# Patient Record
Sex: Male | Born: 1945
Health system: Southern US, Community
[De-identification: ages and names within clinical notes are randomized; demographics above are authoritative.]

## PROBLEM LIST (undated history)

## (undated) DIAGNOSIS — Z8673 Personal history of transient ischemic attack (TIA), and cerebral infarction without residual deficits: Secondary | ICD-10-CM

## (undated) DIAGNOSIS — T7840XA Allergy, unspecified, initial encounter: Secondary | ICD-10-CM

## (undated) DIAGNOSIS — I358 Other nonrheumatic aortic valve disorders: Secondary | ICD-10-CM

## (undated) DIAGNOSIS — M1990 Unspecified osteoarthritis, unspecified site: Secondary | ICD-10-CM

## (undated) DIAGNOSIS — R011 Cardiac murmur, unspecified: Secondary | ICD-10-CM

## (undated) DIAGNOSIS — R29898 Other symptoms and signs involving the musculoskeletal system: Secondary | ICD-10-CM

## (undated) DIAGNOSIS — D62 Acute posthemorrhagic anemia: Secondary | ICD-10-CM

## (undated) DIAGNOSIS — M109 Gout, unspecified: Secondary | ICD-10-CM

## (undated) DIAGNOSIS — M25561 Pain in right knee: Secondary | ICD-10-CM

## (undated) DIAGNOSIS — E669 Obesity, unspecified: Secondary | ICD-10-CM

## (undated) DIAGNOSIS — C801 Malignant (primary) neoplasm, unspecified: Secondary | ICD-10-CM

## (undated) DIAGNOSIS — N183 Chronic kidney disease, stage 3 unspecified: Secondary | ICD-10-CM

## (undated) DIAGNOSIS — K8020 Calculus of gallbladder without cholecystitis without obstruction: Secondary | ICD-10-CM

## (undated) DIAGNOSIS — I10 Essential (primary) hypertension: Secondary | ICD-10-CM

## (undated) DIAGNOSIS — R569 Unspecified convulsions: Secondary | ICD-10-CM

## (undated) DIAGNOSIS — G629 Polyneuropathy, unspecified: Secondary | ICD-10-CM

## (undated) DIAGNOSIS — K5790 Diverticulosis of intestine, part unspecified, without perforation or abscess without bleeding: Secondary | ICD-10-CM

## (undated) DIAGNOSIS — I2510 Atherosclerotic heart disease of native coronary artery without angina pectoris: Secondary | ICD-10-CM

## (undated) DIAGNOSIS — A6000 Herpesviral infection of urogenital system, unspecified: Secondary | ICD-10-CM

## (undated) DIAGNOSIS — E119 Type 2 diabetes mellitus without complications: Secondary | ICD-10-CM

## (undated) DIAGNOSIS — E785 Hyperlipidemia, unspecified: Secondary | ICD-10-CM

## (undated) HISTORY — DX: Acute posthemorrhagic anemia: D62

## (undated) HISTORY — DX: Gout, unspecified: M10.9

## (undated) HISTORY — DX: Essential (primary) hypertension: I10

## (undated) HISTORY — DX: Malignant (primary) neoplasm, unspecified: C80.1

## (undated) HISTORY — DX: Chronic kidney disease, stage 3 unspecified: N18.30

## (undated) HISTORY — DX: Calculus of gallbladder without cholecystitis without obstruction: K80.20

## (undated) HISTORY — DX: Herpesviral infection of urogenital system, unspecified: A60.00

## (undated) HISTORY — DX: Hyperlipidemia, unspecified: E78.5

## (undated) HISTORY — DX: Obesity, unspecified: E66.9

## (undated) HISTORY — DX: Chronic kidney disease, stage 3 (moderate): N18.3

## (undated) HISTORY — DX: Unspecified osteoarthritis, unspecified site: M19.90

## (undated) HISTORY — DX: Type 2 diabetes mellitus without complications: E11.9

## (undated) HISTORY — DX: Allergy, unspecified, initial encounter: T78.40XA

## (undated) HISTORY — DX: Diverticulosis of intestine, part unspecified, without perforation or abscess without bleeding: K57.90

## (undated) HISTORY — DX: Atherosclerotic heart disease of native coronary artery without angina pectoris: I25.10

## (undated) HISTORY — DX: Other nonrheumatic aortic valve disorders: I35.8

---
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
## 2003-10-29 DIAGNOSIS — I2510 Atherosclerotic heart disease of native coronary artery without angina pectoris: Secondary | ICD-10-CM

## 2003-10-29 HISTORY — PX: CORONARY ANGIOPLASTY WITH STENT PLACEMENT: SHX49

## 2003-10-29 HISTORY — DX: Atherosclerotic heart disease of native coronary artery without angina pectoris: I25.10

## 2004-05-03 ENCOUNTER — Other Ambulatory Visit: Payer: Self-pay

## 2006-10-28 HISTORY — PX: KNEE ARTHROSCOPY: SUR90

## 2007-03-18 ENCOUNTER — Ambulatory Visit: Payer: Self-pay | Admitting: Specialist

## 2007-04-09 ENCOUNTER — Other Ambulatory Visit: Payer: Self-pay

## 2007-04-09 ENCOUNTER — Ambulatory Visit: Payer: Self-pay | Admitting: Specialist

## 2007-04-17 ENCOUNTER — Ambulatory Visit: Payer: Self-pay | Admitting: Specialist

## 2008-02-26 HISTORY — PX: COLONOSCOPY: SHX174

## 2008-03-08 ENCOUNTER — Ambulatory Visit: Payer: Self-pay | Admitting: Gastroenterology

## 2008-10-28 DIAGNOSIS — E119 Type 2 diabetes mellitus without complications: Secondary | ICD-10-CM

## 2008-10-28 HISTORY — DX: Type 2 diabetes mellitus without complications: E11.9

## 2011-06-27 LAB — LIPID PANEL
HDL: 36 mg/dL (ref 35–70)
Triglyceride fasting, serum: 248

## 2011-06-27 LAB — COMPREHENSIVE METABOLIC PANEL
ALT: 16 U/L (ref 10–40)
AST: 15 U/L
Alkaline Phosphatase: 66 U/L
BUN: 34 mg/dL — AB (ref 4–21)
Glucose: 120
Potassium: 4.1 mmol/L
Total Bilirubin: 0.4 mg/dL

## 2011-06-27 LAB — CBC
Hemoglobin: 14 g/dL (ref 13.5–17.5)
WBC: 5.1
platelet count: 212

## 2012-06-18 ENCOUNTER — Ambulatory Visit (INDEPENDENT_AMBULATORY_CARE_PROVIDER_SITE_OTHER): Payer: BC Managed Care – PPO | Admitting: Family Medicine

## 2012-06-18 ENCOUNTER — Encounter: Payer: Self-pay | Admitting: Family Medicine

## 2012-06-18 VITALS — BP 132/76 | HR 72 | Temp 98.3°F | Ht 69.0 in | Wt 265.5 lb

## 2012-06-18 DIAGNOSIS — E669 Obesity, unspecified: Secondary | ICD-10-CM

## 2012-06-18 DIAGNOSIS — M109 Gout, unspecified: Secondary | ICD-10-CM | POA: Insufficient documentation

## 2012-06-18 DIAGNOSIS — E785 Hyperlipidemia, unspecified: Secondary | ICD-10-CM | POA: Insufficient documentation

## 2012-06-18 DIAGNOSIS — E1159 Type 2 diabetes mellitus with other circulatory complications: Secondary | ICD-10-CM | POA: Insufficient documentation

## 2012-06-18 DIAGNOSIS — E118 Type 2 diabetes mellitus with unspecified complications: Secondary | ICD-10-CM | POA: Insufficient documentation

## 2012-06-18 DIAGNOSIS — I10 Essential (primary) hypertension: Secondary | ICD-10-CM | POA: Insufficient documentation

## 2012-06-18 DIAGNOSIS — I2510 Atherosclerotic heart disease of native coronary artery without angina pectoris: Secondary | ICD-10-CM | POA: Insufficient documentation

## 2012-06-18 DIAGNOSIS — E1169 Type 2 diabetes mellitus with other specified complication: Secondary | ICD-10-CM | POA: Insufficient documentation

## 2012-06-18 DIAGNOSIS — E66812 Obesity, class 2: Secondary | ICD-10-CM | POA: Insufficient documentation

## 2012-06-18 DIAGNOSIS — E119 Type 2 diabetes mellitus without complications: Secondary | ICD-10-CM

## 2012-06-18 DIAGNOSIS — Z8639 Personal history of other endocrine, nutritional and metabolic disease: Secondary | ICD-10-CM | POA: Insufficient documentation

## 2012-06-18 MED ORDER — ATENOLOL-CHLORTHALIDONE 50-25 MG PO TABS: 1.0000 | ORAL_TABLET | Freq: Every day | ORAL | Status: DC

## 2012-06-18 MED ORDER — LOVASTATIN 40 MG PO TABS: 80.0000 mg | ORAL_TABLET | Freq: Every day | ORAL | Status: DC

## 2012-06-18 MED ORDER — QUINAPRIL HCL 40 MG PO TABS: 40.0000 mg | ORAL_TABLET | Freq: Every day | ORAL | Status: DC

## 2012-06-18 MED ORDER — METFORMIN HCL 500 MG PO TABS: 500.0000 mg | ORAL_TABLET | Freq: Two times a day (BID) | ORAL | Status: DC

## 2012-06-18 NOTE — Assessment & Plan Note (Signed)
H/o R knee pain that impeded activity, discussed slowly reincorporating exercise into routine.  Pt thinks could slowly restart walking,  Encouraged to do this.

## 2012-06-18 NOTE — Assessment & Plan Note (Signed)
Chronic, stable. Compliant with meds.  Continue. 

## 2012-06-18 NOTE — Assessment & Plan Note (Signed)
Stable. Asxs. Will await records from prior PCP, may need to obtain from Dr. Lady Gary.

## 2012-06-18 NOTE — Assessment & Plan Note (Signed)
Check A1c at work, review when returns for CPE. Sounds like good control, but has not been checking regularly for last several months. UTD on vision exam per pt.  Foot exam today.

## 2012-06-18 NOTE — Patient Instructions (Signed)
I've refilled meds - check with pharmacy to ensure they've been received. blood work at work, bring into next appointment which will be physical. Slowly restart walking - watching knee.  Good to meet you today, call us with questions.

## 2012-06-18 NOTE — Progress Notes (Signed)
Subjective:    Patient ID: Thomas Carlson, male    DOB: Sep 19, 1946, 66 y.o.   MRN: [id number]  HPI CC: new pt to establish   Prior saw Dr. Bethena Midget who retired.  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  Has requested records from Dr. Bethena Midget, pending.  DM - dx ~2010. Thinks last A1c ~6.4%.  Doesn't check sugars.  Eye exam in fall.  Gets checked annually.  Compliant with metformin 500mg  bid.  HTN - compliant with quinapril and tenoretic.  HLD - lovastatin 80mg  nightly.  No myalgias.  CAD - h/o stent 2005 with Dr. Lady Gary at [facility].  Never MI.  Found with worsening DOE.  Has not followed with cards in last several years.  Recent bout of gout has been treated with steroid shot in past, has some analgesic at home he takes prn pain.  Preventative: Last CPE 05/2011 Colonoscopy - done around 2005, normal, good for 10 yrs per patient Tetanus - 2010  Caffeine: 2 cans of diet soda/day Lives with wife.  1 dog at home.  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  Past histories reviewed and updated if relevant as below. There is no problem list on file for this patient.  Past Medical History  Diagnosis Date  . Diabetes mellitus 2010  . HTN (hypertension)   . HLD (hyperlipidemia)   . Genital herpes   . CAD (coronary artery disease) 2005    s/p stent Lady Gary)   Past Surgical History  Procedure Date  . Appendectomy 1958  . Hernia repair 1956  . Subdural hematoma evacuation via craniotomy 1964    hit in helmet by baseball  . Coronary angioplasty with stent placement 2005    stent 2005  . Closed reduction clavicle fracture 1955  . Knee arthroscopy 2008    torn meniscus   History  Substance Use Topics  . Smoking status: Never Smoker   . Smokeless tobacco: Never Used  . Alcohol Use: No    Family History  Problem Relation Age of Onset  . Diabetes Father 73  . Coronary artery disease Sister     catheterizations  . COPD Brother   . Stroke Brother   . Hypertension Brother   . Cancer Neg Hx    Allergies  Allergen Reactions  . Penicillins Hives   Current Outpatient Prescriptions on File Prior to Visit  Medication Sig Dispense Refill  . atenolol-chlorthalidone (TENORETIC) 50-25 MG per tablet Take 1 tablet by mouth daily.      . diphenhydrAMINE (BENADRYL) 50 MG capsule Take 50 mg by mouth at bedtime as needed.      . lovastatin (MEVACOR) 40 MG tablet Take 80 mg by mouth at bedtime.      . metFORMIN (GLUCOPHAGE) 500 MG tablet Take 500 mg by mouth 2 (two) times daily with a meal.      . quinapril (ACCUPRIL) 40 MG tablet Take 40 mg by mouth at bedtime.         Review of Systems  Constitutional: Negative for fever, chills, activity change, appetite change, fatigue and unexpected weight change.  HENT: Negative for hearing loss and neck pain.   Eyes: Negative for visual disturbance.  Respiratory: Negative for cough, chest tightness, shortness of breath and wheezing.   Cardiovascular: Negative for chest pain, palpitations and leg swelling.  Gastrointestinal: Negative for nausea, vomiting,  abdominal pain, diarrhea, constipation, blood in stool and abdominal distention.  Genitourinary: Negative for hematuria and difficulty urinating.  Musculoskeletal: Negative for myalgias and arthralgias.  Skin: Negative for rash.  Neurological: Negative for dizziness, seizures, syncope and headaches.  Hematological: Does not bruise/bleed easily.  Psychiatric/Behavioral: Negative for dysphoric mood. The patient is not nervous/anxious.        Objective:   Physical Exam  Nursing note and vitals reviewed. Constitutional: He is oriented to person, place, and time. He appears well-developed and well-nourished. No distress.       obese  HENT:  Head: Normocephalic and atraumatic.  Right  Ear: Hearing, tympanic membrane, external ear and ear canal normal.  Left Ear: Hearing, tympanic membrane, external ear and ear canal normal.  Nose: Nose normal.  Mouth/Throat: Oropharynx is clear and moist. No oropharyngeal exudate.  Eyes: Conjunctivae and EOM are normal. Pupils are equal, round, and reactive to light. No scleral icterus.  Neck: Normal range of motion. Neck supple. Carotid bruit is not present.  Cardiovascular: Normal rate, regular rhythm and intact distal pulses.   Murmur (2/6 SEM) heard. Pulses:      Radial pulses are 2+ on the right side, and 2+ on the left side.  Pulmonary/Chest: Effort normal and breath sounds normal. No respiratory distress. He has no wheezes. He has no rales.  Abdominal: Soft. Bowel sounds are normal. He exhibits no distension and no mass. There is no tenderness. There is no rebound and no guarding.  Musculoskeletal: Normal range of motion. He exhibits no edema.       Diabetic foot exam: Normal inspection No skin breakdown No calluses  Normal DP/PT pulses Normal sensation to light touch and monofilament Nails normal   Lymphadenopathy:    He has no cervical adenopathy.  Neurological: He is alert and oriented to person, place, and time.       CN grossly intact, station and gait intact  Skin: Skin is warm and dry. No rash noted.  Psychiatric: He has a normal mood and affect. His behavior is normal. Judgment and thought content normal.      Assessment & Plan:

## 2012-06-18 NOTE — Assessment & Plan Note (Signed)
Chronic, on lovastatin 80mg  nightly. Check FLP at work, asked him to fax me results to review at upcoming CPE

## 2012-06-22 ENCOUNTER — Telehealth: Payer: Self-pay | Admitting: *Deleted

## 2012-06-22 NOTE — Telephone Encounter (Signed)
Clarification request from mail order pharmacy. Longstanding hx of taking metformin ER. Confirming that you want him taking regular metformin or ER. Form in your IN box.

## 2012-06-23 MED ORDER — METFORMIN HCL ER 500 MG PO TB24: 500.0000 mg | ORAL_TABLET | Freq: Every day | ORAL | Status: DC

## 2012-06-23 NOTE — Telephone Encounter (Signed)
Noted. Replied and placed in Kim's box.

## 2012-06-24 ENCOUNTER — Encounter: Payer: Self-pay | Admitting: Family Medicine

## 2012-06-24 ENCOUNTER — Telehealth: Payer: Self-pay

## 2012-06-24 DIAGNOSIS — N183 Chronic kidney disease, stage 3 unspecified: Secondary | ICD-10-CM | POA: Insufficient documentation

## 2012-06-24 DIAGNOSIS — E1122 Type 2 diabetes mellitus with diabetic chronic kidney disease: Secondary | ICD-10-CM | POA: Insufficient documentation

## 2012-06-24 DIAGNOSIS — M1990 Unspecified osteoarthritis, unspecified site: Secondary | ICD-10-CM | POA: Insufficient documentation

## 2012-06-24 DIAGNOSIS — N1831 Chronic kidney disease, stage 3a: Secondary | ICD-10-CM | POA: Insufficient documentation

## 2012-06-24 MED ORDER — METFORMIN HCL ER 500 MG PO TB24: 500.0000 mg | ORAL_TABLET | Freq: Two times a day (BID) | ORAL | Status: DC

## 2012-06-24 NOTE — Telephone Encounter (Signed)
Form faxed as directed

## 2012-06-24 NOTE — Telephone Encounter (Signed)
Filled and palced in Kim's box. 

## 2012-06-24 NOTE — Telephone Encounter (Signed)
[facility] faxed clarification request for Metformin quantity and dispensing direction. Form in Dr Gutierrez's in box.

## 2012-06-26 LAB — CBC
Hemoglobin: 14.2 g/dL (ref 13.5–17.5)
platelet count: 229

## 2012-06-26 LAB — LIPID PANEL
Cholesterol: 152 mg/dL (ref 0–200)
HDL: 39 mg/dL (ref 35–70)
Triglycerides: 195

## 2012-06-26 LAB — COMPREHENSIVE METABOLIC PANEL
Alkaline Phosphatase: 72 U/L
BUN: 36 mg/dL — AB (ref 4–21)
Creat: 1.39
GGT: 27 U/L (ref 18–76)
Potassium: 3.9 mmol/L
Sodium: 141 mmol/L (ref 137–147)
Total Bilirubin: 0.3 mg/dL

## 2012-06-26 LAB — PSA: PSA: 0.1

## 2012-06-26 LAB — TSH: TSH: 2.97 u[IU]/mL (ref 0.41–5.90)

## 2012-07-06 ENCOUNTER — Encounter: Payer: Self-pay | Admitting: Family Medicine

## 2012-07-10 ENCOUNTER — Ambulatory Visit (INDEPENDENT_AMBULATORY_CARE_PROVIDER_SITE_OTHER): Payer: BC Managed Care – PPO | Admitting: Family Medicine

## 2012-07-10 ENCOUNTER — Encounter: Payer: Self-pay | Admitting: Family Medicine

## 2012-07-10 VITALS — BP 110/64 | HR 80 | Temp 98.0°F | Ht 69.0 in | Wt 258.2 lb

## 2012-07-10 DIAGNOSIS — E669 Obesity, unspecified: Secondary | ICD-10-CM

## 2012-07-10 DIAGNOSIS — E119 Type 2 diabetes mellitus without complications: Secondary | ICD-10-CM

## 2012-07-10 DIAGNOSIS — Z0000 Encounter for general adult medical examination without abnormal findings: Secondary | ICD-10-CM

## 2012-07-10 DIAGNOSIS — M109 Gout, unspecified: Secondary | ICD-10-CM

## 2012-07-10 DIAGNOSIS — Z23 Encounter for immunization: Secondary | ICD-10-CM

## 2012-07-10 DIAGNOSIS — N183 Chronic kidney disease, stage 3 unspecified: Secondary | ICD-10-CM

## 2012-07-10 MED ORDER — ACYCLOVIR 400 MG PO TABS: 400.0000 mg | ORAL_TABLET | Freq: Two times a day (BID) | ORAL | Status: DC

## 2012-07-10 NOTE — Assessment & Plan Note (Signed)
Not frequent flares. Not on daily med.

## 2012-07-10 NOTE — Patient Instructions (Addendum)
Pneumonia shot today. Sign release of records for [facility] colonoscopy. Flu shot at work. Return in 3 months for follow up after rechecking A1c and kidney function. Keep up the good work with lifestyle changes and weight!

## 2012-07-10 NOTE — Assessment & Plan Note (Signed)
Weight loss noted. Congratulated 

## 2012-07-10 NOTE — Assessment & Plan Note (Signed)
Chronic Deteriorated control with last A1c 7.0%.   Since then, has lost weight and monitored diet more carefully. Continue current meds. rtc 3 mo for recheck. Has started checking cbgs regularly, brings log.

## 2012-07-10 NOTE — Progress Notes (Signed)
Subjective:    Patient ID: Thomas Carlson, male    DOB: 1946-09-16, 66 y.o.   MRN: [id number]  HPI CC: CPE  DM - recent blood work with A1c elevated to 7.0%.  Brings log showing sugars - fasting 120-130.  Started walking more.  Started watching sugar. No results found for this basename: HGBA1C    CAD- denies anginal sxs.  S/p stent, does not currently follow with cards.  prior Dr. Lady Gary at [facility] Readings from Last 3 Encounters:  07/10/12 258 lb 4 oz (117.141 kg)  06/18/12 265 lb 8 oz (120.43 kg)   Preventative:  Last CPE 05/2011  Colonoscopy - done around 2005, normal, good for 10 yrs per patient .  No record of this in prior records from Dr. Buzzy Han.  Will request today Prostate - would like to be screened but declines DRE today as PSA 0.1  Aware both tests screen better than either alone. Tetanus - 2012 flu shot to receive at work. Shingle shot 2011 Pneumovax today.  No falls in last year. Denies depression/anhedonia, sadness.  Seat belt use discuss 100% Sunscreen use discussed  Medications and allergies reviewed and updated in chart.  Past histories reviewed and updated if relevant as below. Patient Active Problem List  Diagnosis  . Diabetes mellitus  . HTN (hypertension)  . HLD (hyperlipidemia)  . CAD (coronary artery disease)  . Gout  . Obesity  . Chronic kidney disease, stage 3, mod decreased GFR  . DJD (degenerative joint disease)   Past Medical History  Diagnosis Date  . Diabetes mellitus 2010  . HTN (hypertension)   . HLD (hyperlipidemia)   . Genital herpes   . CAD (coronary artery disease) 2005    s/p stent ([facility])  . Gout   . Obesity   . DJD (degenerative joint disease)     knee  . Chronic kidney disease, stage 3, mod decreased GFR    Past Surgical History  Procedure Date  . Appendectomy 1958  . Hernia repair 1956  . Subdural hematoma evacuation via craniotomy 1964    hit in helmet by baseball  . Coronary angioplasty with stent placement 2005   stent 2005  . Closed reduction clavicle fracture 1955  . Knee arthroscopy 2008    torn meniscus   History  Substance Use Topics  . Smoking status: Never Smoker   . Smokeless tobacco: Never Used  . Alcohol Use: No   Family History  Problem Relation Age of Onset  . Diabetes Father 56  . Coronary artery disease Sister     catheterizations  . COPD Brother   . Stroke Brother   . Hypertension Brother   . Cancer Neg Hx    Allergies  Allergen Reactions  . Penicillins Hives   Current Outpatient Prescriptions on File Prior to Visit  Medication Sig Dispense Refill  . acyclovir (ZOVIRAX) 400 MG tablet Take 400 mg by mouth 2 (two) times daily.      Marland Kitchen aspirin 81 MG tablet Take 81 mg by mouth daily.      Marland Kitchen atenolol-chlorthalidone (TENORETIC) 50-25 MG per tablet Take 1 tablet by mouth daily.  90 tablet  3  . diphenhydrAMINE (BENADRYL) 50 MG capsule Take 50 mg by mouth at bedtime as needed.      . lovastatin (MEVACOR) 40 MG tablet Take 2 tablets (80 mg total) by mouth at bedtime.  180 tablet  3  . metFORMIN (GLUCOPHAGE-XR) 500 MG 24 hr tablet Take 1 tablet (500 mg  total) by mouth 2 (two) times daily.  180 tablet  3  . Omega-3 Fatty Acids (FISH OIL) 1360 MG CAPS Take 1 capsule by mouth 2 (two) times daily.      . quinapril (ACCUPRIL) 40 MG tablet Take 1 tablet (40 mg total) by mouth at bedtime.  90 tablet  3    Review of Systems  Constitutional: Negative for fever, chills, activity change, appetite change, fatigue and unexpected weight change.  HENT: Negative for hearing loss and neck pain.   Eyes: Negative for visual disturbance.  Respiratory: Negative for cough, chest tightness, shortness of breath and wheezing.   Cardiovascular: Negative for chest pain, palpitations and leg swelling.  Gastrointestinal: Negative for nausea, vomiting, abdominal pain, diarrhea, constipation, blood in stool and abdominal distention.  Genitourinary: Negative for hematuria and difficulty urinating.    Musculoskeletal: Negative for myalgias and arthralgias.  Skin: Negative for rash.  Neurological: Negative for dizziness, seizures, syncope and headaches.  Hematological: Does not bruise/bleed easily.  Psychiatric/Behavioral: Negative for dysphoric mood. The patient is not nervous/anxious.        Objective:   Physical Exam  Nursing note and vitals reviewed. Constitutional: He is oriented to person, place, and time. He appears well-developed and well-nourished. No distress.       obese  HENT:  Head: Normocephalic and atraumatic.  Right Ear: External ear normal.  Left Ear: External ear normal.  Nose: Nose normal.  Mouth/Throat: Oropharynx is clear and moist. No oropharyngeal exudate.  Eyes: Conjunctivae normal and EOM are normal. Pupils are equal, round, and reactive to light. No scleral icterus.  Neck: Normal range of motion. Neck supple. Carotid bruit is not present.  Cardiovascular: Normal rate, regular rhythm, normal heart sounds and intact distal pulses.   No murmur heard. Pulses:      Radial pulses are 2+ on the right side, and 2+ on the left side.  Pulmonary/Chest: Effort normal and breath sounds normal. No respiratory distress. He has no wheezes. He has no rales.  Abdominal: Soft. Bowel sounds are normal. He exhibits no distension and no mass. There is no tenderness. There is no rebound and no guarding.  Genitourinary:       declined  Musculoskeletal: Normal range of motion. He exhibits no edema.  Lymphadenopathy:    He has no cervical adenopathy.  Neurological: He is alert and oriented to person, place, and time.       CN grossly intact, station and gait intact  Skin: Skin is warm and dry. No rash noted.  Psychiatric: He has a normal mood and affect. His behavior is normal. Judgment and thought content normal.       Assessment & Plan:

## 2012-07-10 NOTE — Assessment & Plan Note (Signed)
Continue to monitor for now. Watch metformin closely. Recheck in 3 mo.

## 2012-07-10 NOTE — Assessment & Plan Note (Signed)
Preventative protocols reviewed and updated unless pt declined. discussed healthy diet/lifestyle

## 2012-07-10 NOTE — Addendum Note (Signed)
Addended by: Josph Macho A on: 07/10/2012 09:26 AM   Modules accepted: Orders

## 2012-09-08 ENCOUNTER — Encounter: Payer: Self-pay | Admitting: Family Medicine

## 2012-09-18 LAB — HEMOGLOBIN A1C: A1c: 6.2

## 2012-10-08 ENCOUNTER — Encounter: Payer: Self-pay | Admitting: *Deleted

## 2012-10-08 ENCOUNTER — Encounter: Payer: Self-pay | Admitting: Family Medicine

## 2012-10-09 ENCOUNTER — Ambulatory Visit: Payer: BC Managed Care – PPO | Admitting: Family Medicine

## 2012-10-09 DIAGNOSIS — Z0289 Encounter for other administrative examinations: Secondary | ICD-10-CM

## 2012-11-10 ENCOUNTER — Other Ambulatory Visit: Payer: Self-pay

## 2012-11-10 MED ORDER — METFORMIN HCL ER 500 MG PO TB24: 500.0000 mg | ORAL_TABLET | Freq: Two times a day (BID) | ORAL | Status: DC

## 2012-11-10 NOTE — Telephone Encounter (Signed)
Pt contacted [facility] and they did not receive metformin refill. Refill resent. Pt notified while on phone.

## 2013-03-19 ENCOUNTER — Other Ambulatory Visit: Payer: Self-pay | Admitting: *Deleted

## 2013-03-19 MED ORDER — ACYCLOVIR 400 MG PO TABS: 400.0000 mg | ORAL_TABLET | Freq: Two times a day (BID) | ORAL | Status: DC

## 2013-03-19 MED ORDER — ATENOLOL-CHLORTHALIDONE 50-25 MG PO TABS: 1.0000 | ORAL_TABLET | Freq: Every day | ORAL | Status: DC

## 2013-05-06 ENCOUNTER — Other Ambulatory Visit: Payer: Self-pay

## 2013-05-19 ENCOUNTER — Encounter: Payer: Self-pay | Admitting: Family Medicine

## 2013-05-19 ENCOUNTER — Ambulatory Visit (INDEPENDENT_AMBULATORY_CARE_PROVIDER_SITE_OTHER): Payer: BC Managed Care – PPO | Admitting: Family Medicine

## 2013-05-19 VITALS — BP 110/70 | HR 68 | Temp 98.2°F | Wt 238.5 lb

## 2013-05-19 DIAGNOSIS — L989 Disorder of the skin and subcutaneous tissue, unspecified: Secondary | ICD-10-CM

## 2013-05-19 DIAGNOSIS — L270 Generalized skin eruption due to drugs and medicaments taken internally: Secondary | ICD-10-CM

## 2013-05-19 DIAGNOSIS — R21 Rash and other nonspecific skin eruption: Secondary | ICD-10-CM | POA: Insufficient documentation

## 2013-05-19 MED ORDER — PREDNISONE 20 MG PO TABS: ORAL_TABLET | ORAL | Status: DC

## 2013-05-19 NOTE — Patient Instructions (Addendum)
Let's stop bactrim.  You are allergic to this medicine.  i've updated your allergy list. May continue benadryl 50mg  three times daily.  If this doesn't help control itch, may do steroid course. Oatmeal bath for itching.

## 2013-05-19 NOTE — Progress Notes (Signed)
  Subjective:    Patient ID: Thomas Carlson, male    DOB: [birth year], 67 y.o.   MRN: [id number]  HPI CC: ?hives  Rash started last night.  All over body.  Very pruritic.  Has been taking benadryl for this. Started on bactrim (2 DS BID) by wellness center at work last week for lesion R lower leg.  Denies bug bite or itch.  No new lotions, detergents, soaps or shampoos, other meds. No new foods. No similar rashes at home.  No fevers/chills, oral lesions, throat or tongue swelling, dyspnea, joint pains.  Last bactrim dose was yesterday morning.  No results found for this basename: HGBA1C    Past Medical History  Diagnosis Date  . Diabetes mellitus 2010  . HTN (hypertension)   . HLD (hyperlipidemia)   . Genital herpes   . CAD (coronary artery disease) 2005    s/p stent ([facility])  . Gout   . Obesity   . DJD (degenerative joint disease)     knee  . Chronic kidney disease, stage 3, mod decreased GFR      Review of Systems Per HPI    Objective:   Physical Exam  Nursing note and vitals reviewed. Constitutional: He appears well-developed and well-nourished. No distress.  HENT:  Mouth/Throat: Oropharynx is clear and moist. No oropharyngeal exudate.  Skin: Skin is warm and dry. Rash noted. There is erythema.  Diffuse maculopapular rash throughout body, spares palms and soles. R anterior shin with erythematous blancheable macule with central hyperkeratotic scab. 1.5 x 2 cm       Assessment & Plan:

## 2013-05-19 NOTE — Assessment & Plan Note (Addendum)
Due to bactrim - allergies updated. Has already stopped bactrim. Recommended he take benadryl and oatmeal bath for itching - if not controlled with this, may use steroid taper (but did discuss steroid precautions especially in h/o T2DM). Anticipate should resolve with time off bactrim.

## 2013-05-19 NOTE — Assessment & Plan Note (Signed)
Unclear etiology - ?inflammatory nodule after bug bite. Treat with triple abx ointment, to update me if persistent for referral to derm. Stop bactrim.

## 2013-07-13 ENCOUNTER — Telehealth: Payer: Self-pay | Admitting: Family Medicine

## 2013-07-13 DIAGNOSIS — E119 Type 2 diabetes mellitus without complications: Secondary | ICD-10-CM

## 2013-07-13 DIAGNOSIS — E785 Hyperlipidemia, unspecified: Secondary | ICD-10-CM

## 2013-07-13 DIAGNOSIS — M109 Gout, unspecified: Secondary | ICD-10-CM

## 2013-07-13 DIAGNOSIS — Z125 Encounter for screening for malignant neoplasm of prostate: Secondary | ICD-10-CM

## 2013-07-13 DIAGNOSIS — E559 Vitamin D deficiency, unspecified: Secondary | ICD-10-CM

## 2013-07-13 DIAGNOSIS — I10 Essential (primary) hypertension: Secondary | ICD-10-CM

## 2013-07-13 NOTE — Telephone Encounter (Signed)
Pt is scheduled for CPE 08/03/2013.  He works at [employer] and can have his labs drawn in the wellness ctr there. He wants to know if you can mail him a copy of the order so they can draw the labs and he will bring the results in w/him to his visit. Thank you.

## 2013-07-14 DIAGNOSIS — E559 Vitamin D deficiency, unspecified: Secondary | ICD-10-CM | POA: Insufficient documentation

## 2013-07-14 NOTE — Telephone Encounter (Signed)
Will order vit D, BMP, A1c, FLP, PSA

## 2013-07-15 NOTE — Telephone Encounter (Signed)
Wrote script and placed in Kim's box. 

## 2013-07-15 NOTE — Telephone Encounter (Signed)
Labs mailed as requested.

## 2013-07-21 LAB — LIPID PANEL
HDL: 41 mg/dL (ref 35–70)
LDL (calc): 76

## 2013-07-21 LAB — THYROID PANEL
Free T4: 1.8
T3 Uptake: 32
T4, Total: 5.7
TSH: 3.23

## 2013-07-21 LAB — COMPREHENSIVE METABOLIC PANEL
Alkaline Phosphatase: 60 U/L
Calcium: 8.9 mg/dL
Chloride: 102 mmol/L
LDH: 146 U/L
Total Bilirubin: 0.3 mg/dL

## 2013-07-21 LAB — PSA: PSA: 0.2

## 2013-07-21 LAB — CBC
HGB: 14.2 g/dL
platelet count: 216

## 2013-08-03 ENCOUNTER — Ambulatory Visit (INDEPENDENT_AMBULATORY_CARE_PROVIDER_SITE_OTHER): Payer: BC Managed Care – PPO | Admitting: Family Medicine

## 2013-08-03 ENCOUNTER — Encounter: Payer: Self-pay | Admitting: Family Medicine

## 2013-08-03 VITALS — BP 126/64 | HR 78 | Temp 98.2°F | Ht 69.0 in | Wt 236.5 lb

## 2013-08-03 DIAGNOSIS — E119 Type 2 diabetes mellitus without complications: Secondary | ICD-10-CM

## 2013-08-03 DIAGNOSIS — Z0000 Encounter for general adult medical examination without abnormal findings: Secondary | ICD-10-CM

## 2013-08-03 DIAGNOSIS — E669 Obesity, unspecified: Secondary | ICD-10-CM

## 2013-08-03 DIAGNOSIS — I2510 Atherosclerotic heart disease of native coronary artery without angina pectoris: Secondary | ICD-10-CM

## 2013-08-03 DIAGNOSIS — I10 Essential (primary) hypertension: Secondary | ICD-10-CM

## 2013-08-03 DIAGNOSIS — E785 Hyperlipidemia, unspecified: Secondary | ICD-10-CM

## 2013-08-03 MED ORDER — QUINAPRIL HCL 40 MG PO TABS: 40.0000 mg | ORAL_TABLET | Freq: Every day | ORAL | Status: DC

## 2013-08-03 MED ORDER — ACYCLOVIR 400 MG PO TABS: 400.0000 mg | ORAL_TABLET | Freq: Two times a day (BID) | ORAL | Status: DC

## 2013-08-03 MED ORDER — METFORMIN HCL ER 500 MG PO TB24: 500.0000 mg | ORAL_TABLET | Freq: Two times a day (BID) | ORAL | Status: DC

## 2013-08-03 MED ORDER — ATENOLOL-CHLORTHALIDONE 50-25 MG PO TABS: 1.0000 | ORAL_TABLET | Freq: Every day | ORAL | Status: DC

## 2013-08-03 MED ORDER — LOVASTATIN 40 MG PO TABS: 80.0000 mg | ORAL_TABLET | Freq: Every day | ORAL | Status: DC

## 2013-08-03 NOTE — Progress Notes (Addendum)
Subjective:    Patient ID: Thomas Carlson, male    DOB: September 13, 1946, 67 y.o.   MRN: [id number]  HPI CC: CPE  DM - due Oct 2014.  No paresthesias.  Cutting out carbs.  Steadily losing weight.   Wt Readings from Last 3 Encounters:  08/03/13 236 lb 8 oz (107.276 kg)  05/19/13 238 lb 8 oz (108.183 kg)  07/10/12 258 lb 4 oz (117.141 kg)   Body mass index is 34.91 kg/(m^2).   Preventative:  Colonoscopy - done 2009, normal, good for 10 yrs . Servando Snare) Prostate - DRE/PSA today.  Tetanus - 2012  Pneumovax 06/2012 (completed) flu shot to receive at work.  Shingle shot 2011   No falls in last year.  Denies depression/anhedonia, sadness.  Seat belt use discuss 100%  Sunscreen use discussed  Caffeine: 2 cans of diet soda/day Lives with wife.  1 dog at home.  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  Past histories reviewed and updated if relevant as below. Patient Active Problem List   Diagnosis Date Noted  . Unspecified vitamin D deficiency 07/14/2013  . Drug rash 05/19/2013  . Skin lesion 05/19/2013  . Healthcare maintenance 07/10/2012  . Chronic kidney disease, stage 3, mod decreased GFR   . DJD (degenerative joint disease)   . Diabetes mellitus   . HTN (hypertension)   . HLD (hyperlipidemia)   . CAD (coronary artery disease)   . Gout   . Obesity    Past Medical History  Diagnosis Date  . Diabetes mellitus 2010  . HTN (hypertension)   . HLD (hyperlipidemia)   . Genital herpes   . CAD (coronary artery disease) 2005    s/p stent ([facility])  . Gout   . Obesity   . DJD (degenerative joint disease)     knee  . Chronic kidney disease, stage 3, mod decreased GFR    Past Surgical History  Procedure Laterality Date  . Appendectomy  1958  . Hernia repair  1956  . Subdural  hematoma evacuation via craniotomy  1964    hit in helmet by baseball  . Coronary angioplasty with stent placement  2005    stent 2005  . Closed reduction clavicle fracture  1955  . Knee arthroscopy  2008    torn meniscus  . Colonoscopy  02/2008    int hemorrhoids, diverticula (Dr. Servando Snare)   History  Substance Use Topics  . Smoking status: Never Smoker   . Smokeless tobacco: Never Used  . Alcohol Use: No   Family History  Problem Relation Age of Onset  . Diabetes Father 56  . Coronary artery disease Sister     catheterizations  . COPD Brother   . Stroke Brother   . Hypertension Brother   . Cancer Neg Hx    Allergies  Allergen Reactions  . Penicillins Hives  . Bactrim [Sulfamethoxazole-Tmp Ds] Rash    Diffuse drug reaction - maculopapular rash   Current Outpatient Prescriptions on File Prior to Visit  Medication Sig Dispense Refill  . acyclovir (ZOVIRAX) 400 MG tablet Take 1 tablet (400 mg total) by mouth 2 (two) times daily.  180 tablet  0  . aspirin 81 MG tablet Take 81 mg by mouth daily.      Marland Kitchen atenolol-chlorthalidone (TENORETIC) 50-25 MG per tablet Take 1 tablet by mouth daily.  90 tablet  0  . cholecalciferol (VITAMIN D) 1000 UNITS tablet Take 1,000 Units by mouth daily.      . indomethacin (INDOCIN) 50 MG capsule Take 50 mg by mouth 2 (two) times daily as needed.      . lovastatin (MEVACOR) 40 MG tablet Take 2 tablets (80 mg total) by mouth at bedtime.  180 tablet  3  . metFORMIN (GLUCOPHAGE-XR) 500 MG 24 hr tablet Take 1 tablet (500 mg total) by mouth 2 (two) times daily.  180 tablet  3  . Multiple Vitamin (MULTIVITAMIN) tablet Take 1 tablet by mouth daily.      . Omega-3 Fatty Acids (FISH OIL) 1360 MG CAPS Take 1 capsule by mouth 2 (two) times daily.      . quinapril (ACCUPRIL) 40 MG tablet Take 1 tablet (40 mg total) by mouth at bedtime.  90 tablet  3  . diphenhydrAMINE (BENADRYL) 50 MG capsule Take 50 mg by mouth at bedtime as needed.       No current  facility-administered medications on file prior to visit.     Review of Systems  Constitutional: Negative for fever, chills, activity change, appetite change, fatigue and unexpected weight change.  HENT: Negative for hearing loss and neck pain.   Eyes: Negative for visual disturbance.  Respiratory: Negative for cough, chest tightness, shortness of breath and wheezing.   Cardiovascular: Negative for chest pain, palpitations and leg swelling.  Gastrointestinal: Negative for nausea, vomiting, abdominal pain, diarrhea, constipation, blood in stool and abdominal distention.  Genitourinary: Negative for hematuria and difficulty urinating.  Musculoskeletal: Negative for myalgias and arthralgias.  Skin: Negative for rash.  Neurological: Negative for dizziness, seizures, syncope and headaches.  Hematological: Negative for adenopathy. Does not bruise/bleed easily.  Psychiatric/Behavioral: Negative for dysphoric mood. The patient is not nervous/anxious.        Objective:   Physical Exam  Nursing note and vitals reviewed. Constitutional: He is oriented to person, place, and time. He appears well-developed and well-nourished. No distress.  HENT:  Head: Normocephalic and atraumatic.  Right Ear: External ear normal.  Left Ear: External ear normal.  Nose: Nose normal.  Mouth/Throat: Oropharynx is clear and moist. No oropharyngeal exudate.  Eyes: Conjunctivae and EOM are normal. Pupils are equal, round, and reactive to light. No scleral icterus.  Neck: Normal range of motion. Neck supple. No thyromegaly present.  Cardiovascular: Normal rate, regular rhythm and intact distal pulses.   Murmur (mid systolic murmur best at lower sternal border) heard. Pulses:      Radial pulses are 2+ on the right side, and 2+ on the left side.  Pulmonary/Chest: Effort normal and breath sounds normal. No respiratory distress. He has no wheezes. He has no rales.  Abdominal: Soft. Bowel sounds are normal. He exhibits no  distension and no mass. There is no tenderness. There is no rebound and no guarding.  Genitourinary: Rectum normal and prostate normal. Rectal exam shows no external hemorrhoid, no internal hemorrhoid, no fissure, no mass, no tenderness and anal tone normal. Prostate is not enlarged (15-20gm) and not tender.  Musculoskeletal: Normal range of motion. He exhibits no edema.  Diabetic foot exam: Normal inspection No skin breakdown No calluses  Normal DP/PT pulses Normal sensation to light touch and monofilament Nails normal  Lymphadenopathy:    He has no cervical adenopathy.  Neurological: He is alert and oriented to person, place, and time.  CN grossly intact, station and gait intact  Skin: Skin is warm and dry. No  rash noted.  Psychiatric: He has a normal mood and affect. His behavior is normal. Judgment and thought content normal.       Assessment & Plan:

## 2013-08-03 NOTE — Assessment & Plan Note (Signed)
Chronic, stable. Continue lovastatin.  

## 2013-08-03 NOTE — Assessment & Plan Note (Signed)
Preventative protocols reviewed and updated unless pt declined. Discussed healthy diet and lifestyle. Prostate screening every 2-3 years.

## 2013-08-03 NOTE — Assessment & Plan Note (Signed)
Chronic, stable. Continue meds. 

## 2013-08-03 NOTE — Assessment & Plan Note (Signed)
Congratulated on weight loss to date - encouraged continued efforts.

## 2013-08-03 NOTE — Assessment & Plan Note (Signed)
Recheck in 3 months, consider renal US if not already done. Discussed avoiding NSAIDs and good hydration status.

## 2013-08-03 NOTE — Assessment & Plan Note (Signed)
asxs. 

## 2013-08-03 NOTE — Assessment & Plan Note (Signed)
Chronic, improved. Weight loss noted. Encouraged continued weight loss through healthy diet changes.

## 2013-08-03 NOTE — Patient Instructions (Addendum)
Good to see you today! Call us with questions. Kidney function elevated on recent blood test.  Recheck blood work in 3 months to follow kidney function - check renal function panel and urine microalbumin then return for office visit. In the meantime, ensure good hydration status, and avoid ibuprofen, aleve, advil, motrin.  Tylenol is ok.

## 2013-08-04 ENCOUNTER — Encounter: Payer: Self-pay | Admitting: *Deleted

## 2014-05-19 LAB — FERRITIN: Ferritin: 173

## 2014-05-19 LAB — CBC
HGB: 13.7 g/dL
HGB: 13.7 g/dL
WBC: 5.4
WBC: 5.4
platelet count: 213
platelet count: 213

## 2014-05-19 LAB — PSA: PSA: 0.2

## 2014-05-19 LAB — COMPREHENSIVE METABOLIC PANEL
ALT: 13
AST: 17 U/L
Alkaline Phosphatase: 52 U/L
BUN: 35 mg/dL — AB (ref 4–21)
Bilirubin: 0.2
Creat: 1.5
Glucose: 115
POTASSIUM: 4.3 mmol/L
Sodium: 143 mmol/L (ref 137–147)
URIC ACID: 9.5

## 2014-05-19 LAB — TSH: TSH: 2.16 u[IU]/mL (ref 0.41–5.90)

## 2014-05-19 LAB — LIPID PANEL
Cholesterol: 163 mg/dL (ref 0–200)
HDL: 44 mg/dL (ref 35–70)
LDL (calc): 86
TRIGLYCERIDES: 163

## 2014-05-19 LAB — VITAMIN D 25 HYDROXY (VIT D DEFICIENCY, FRACTURES): Vit D, 25-Hydroxy: 31.5

## 2014-05-19 LAB — HEMOGLOBIN A1C: A1C: 6.2

## 2014-05-19 LAB — IRON: Iron: 122

## 2014-05-26 ENCOUNTER — Encounter: Payer: Self-pay | Admitting: Family Medicine

## 2014-05-26 ENCOUNTER — Ambulatory Visit (INDEPENDENT_AMBULATORY_CARE_PROVIDER_SITE_OTHER): Payer: BC Managed Care – PPO | Admitting: Family Medicine

## 2014-05-26 VITALS — BP 130/72 | HR 68 | Temp 98.2°F | Wt 243.5 lb

## 2014-05-26 DIAGNOSIS — M7918 Myalgia, other site: Secondary | ICD-10-CM

## 2014-05-26 DIAGNOSIS — E118 Type 2 diabetes mellitus with unspecified complications: Secondary | ICD-10-CM

## 2014-05-26 DIAGNOSIS — I10 Essential (primary) hypertension: Secondary | ICD-10-CM

## 2014-05-26 DIAGNOSIS — N183 Chronic kidney disease, stage 3 unspecified: Secondary | ICD-10-CM

## 2014-05-26 DIAGNOSIS — M5116 Intervertebral disc disorders with radiculopathy, lumbar region: Secondary | ICD-10-CM | POA: Insufficient documentation

## 2014-05-26 DIAGNOSIS — I2510 Atherosclerotic heart disease of native coronary artery without angina pectoris: Secondary | ICD-10-CM

## 2014-05-26 DIAGNOSIS — Z0000 Encounter for general adult medical examination without abnormal findings: Secondary | ICD-10-CM

## 2014-05-26 DIAGNOSIS — R21 Rash and other nonspecific skin eruption: Secondary | ICD-10-CM

## 2014-05-26 DIAGNOSIS — IMO0001 Reserved for inherently not codable concepts without codable children: Secondary | ICD-10-CM

## 2014-05-26 DIAGNOSIS — E785 Hyperlipidemia, unspecified: Secondary | ICD-10-CM

## 2014-05-26 MED ORDER — TACROLIMUS 0.03 % EX OINT: TOPICAL_OINTMENT | Freq: Every day | CUTANEOUS | Status: DC

## 2014-05-26 NOTE — Assessment & Plan Note (Signed)
Preventative protocols reviewed and updated unless pt declined. Discussed healthy diet and lifestyle.  Insurance will cover physical each calendar year Prostate screening Q2 years.

## 2014-05-26 NOTE — Progress Notes (Signed)
Pre visit review using our clinic review tool, if applicable. No additional management support is needed unless otherwise documented below in the visit note. 

## 2014-05-26 NOTE — Assessment & Plan Note (Signed)
I have requested he send me faxed recent Cr. Discussed avoiding NSAIDs and good hydration status.

## 2014-05-26 NOTE — Assessment & Plan Note (Signed)
asxs. 

## 2014-05-26 NOTE — Patient Instructions (Addendum)
Try protopic for rash on face. Check on cholesterol and kidney labwork. Good to see you today, call us with questions. Return to see me in 6 months for diabetes follow up

## 2014-05-26 NOTE — Assessment & Plan Note (Signed)
Chronic, stable. Continue lovastatin.  

## 2014-05-26 NOTE — Assessment & Plan Note (Signed)
Chronic, stable. Continue meds. 

## 2014-05-26 NOTE — Progress Notes (Signed)
BP 130/72  Pulse 68  Temp(Src) 98.2 F (36.8 C) (Oral)  Wt 243 lb 8 oz (110.451 kg)  SpO2 96%   CC: CPE  Subjective:    Patient ID: Thomas Carlson, male    DOB: 10/06/1946, 68 y.o.   MRN: [id number]  HPI: Thomas Carlson is a 68 y.o. male presenting on 05/26/2014 for Annual Exam   Reviewed labwork faxed from [facility]. To retire on Monday. Planning on getting supplemental medicare.  Rash on corners of mouth and corners of nose - prior treated with steroid cream then tried wife's protopic which helped.  Back pain for last 1+ month - taking celebrex for this. Started as lower back ache on left buttock. Now progressed to sting/burn. Saw [facility] PA. Never with rash.  No shooting pain down legs, numbness of weakness of legs, fevers/chills, bowel/bladder incontinence.  Wt Readings from Last 3 Encounters:  05/26/14 243 lb 8 oz (110.451 kg)  08/03/13 236 lb 8 oz (107.276 kg)  05/19/13 238 lb 8 oz (108.183 kg)   Body mass index is 35.94 kg/(m^2).  Seat belt use discuss 100%  Sunscreen use discussed   Preventative: Colonoscopy - done 2009, normal, good for 10 yrs . [facility])  Prostate - discussed requests screening QO year - check 2016. Tetanus - 2012  Pneumovax 06/2012, prevnar today. flu shot to receive at work.  Shingle shot 2011   Caffeine: 2 cans of diet soda/day  Lives with wife. 1 dog at home. 2 grown children  Occupation: Advertising copywriter at [employer], current [employer] 2015 Edu: Master's degree  Activity: to start walking Diet: some water, lots of diet sodas, fruits/vegetables occasionally, cutting back on carbs  Relevant past medical, surgical, family and social history reviewed and updated as indicated.  Allergies and medications reviewed and updated. Current Outpatient Prescriptions on File Prior to Visit  Medication Sig  . acyclovir (ZOVIRAX) 400 MG tablet Take 1 tablet (400 mg total) by mouth 2 (two) times daily.  Marland Kitchen aspirin 81 MG tablet Take 81  mg by mouth daily.  Marland Kitchen atenolol-chlorthalidone (TENORETIC) 50-25 MG per tablet Take 1 tablet by mouth daily.  . cholecalciferol (VITAMIN D) 1000 UNITS tablet Take 1,000 Units by mouth daily.  . indomethacin (INDOCIN) 50 MG capsule Take 50 mg by mouth 2 (two) times daily as needed.  . lovastatin (MEVACOR) 40 MG tablet Take 2 tablets (80 mg total) by mouth at bedtime.  . metFORMIN (GLUCOPHAGE-XR) 500 MG 24 hr tablet Take 1 tablet (500 mg total) by mouth 2 (two) times daily.  . Multiple Vitamin (MULTIVITAMIN) tablet Take 1 tablet by mouth daily.  . Omega-3 Fatty Acids (FISH OIL) 1360 MG CAPS Take 1 capsule by mouth 2 (two) times daily.  . quinapril (ACCUPRIL) 40 MG tablet Take 1 tablet (40 mg total) by mouth at bedtime.   No current facility-administered medications on file prior to visit.    Review of Systems  Constitutional: Negative for fever, chills, activity change, appetite change, fatigue and unexpected weight change (weight gain).  HENT: Negative for hearing loss.   Eyes: Negative for visual disturbance.  Respiratory: Negative for cough, chest tightness, shortness of breath and wheezing.   Cardiovascular: Negative for chest pain, palpitations and leg swelling.  Gastrointestinal: Negative for nausea, vomiting, abdominal pain, diarrhea, constipation, blood in stool and abdominal distention.  Genitourinary: Negative for hematuria and difficulty urinating.  Musculoskeletal: Negative for arthralgias, myalgias and neck pain.  Skin: Negative for rash.  Neurological: Negative  for dizziness, seizures, syncope and headaches.  Hematological: Negative for adenopathy. Does not bruise/bleed easily.  Psychiatric/Behavioral: Negative for dysphoric mood. The patient is not nervous/anxious.    Per HPI unless specifically indicated above    Objective:    BP 130/72  Pulse 68  Temp(Src) 98.2 F (36.8 C) (Oral)  Wt 243 lb 8 oz (110.451 kg)  SpO2 96%  Physical Exam  Nursing note and vitals  reviewed. Constitutional: He is oriented to person, place, and time. He appears well-developed and well-nourished. No distress.  obese  HENT:  Head: Normocephalic and atraumatic.  Right Ear: Hearing, tympanic membrane, external ear and ear canal normal.  Left Ear: Hearing, tympanic membrane, external ear and ear canal normal.  Nose: Nose normal.  Mouth/Throat: Uvula is midline, oropharynx is clear and moist and mucous membranes are normal. No oropharyngeal exudate, posterior oropharyngeal edema or posterior oropharyngeal erythema.  Eyes: Conjunctivae and EOM are normal. Pupils are equal, round, and reactive to light. No scleral icterus.  Neck: Normal range of motion. Neck supple. Carotid bruit is not present. No thyromegaly present.  Cardiovascular: Normal rate, regular rhythm and intact distal pulses.   Murmur (mild systolic murmur ) heard. Pulses:      Radial pulses are 2+ on the right side, and 2+ on the left side.  Pulmonary/Chest: Effort normal and breath sounds normal. No respiratory distress. He has no wheezes. He has no rales.  Abdominal: Soft. Bowel sounds are normal. He exhibits no distension and no mass. There is no tenderness. There is no rebound and no guarding.  Musculoskeletal: Normal range of motion. He exhibits no edema.  No pain midline spine No paraspinous mm tenderness Neg SLR bilaterally. Neg FABER. No pain at SIJ, GTB or sciatic notch bilaterally.  Lymphadenopathy:    He has no cervical adenopathy.  Neurological: He is alert and oriented to person, place, and time.  CN grossly intact, station and gait intact  Skin: Skin is warm and dry. No rash noted.  No vesicular drying rash  Psychiatric: He has a normal mood and affect. His behavior is normal. Judgment and thought content normal.       Assessment & Plan:   Problem List Items Addressed This Visit   Skin rash     Along face - anticipate atopic dermatitis. Prescribed protopic today which has helped in  past.    Left buttock pain     Sciatica vs ?postherpetic neuralgia after rash-less shingles. Provided with stretches for piriformis syndrome. If persistent discomfort, discussed trial of gabapentin.    HTN (hypertension)     Chronic, stable. Continue meds.    HLD (hyperlipidemia)     Chronic, stable. Continue lovastatin.    Healthcare maintenance - Primary     Preventative protocols reviewed and updated unless pt declined. Discussed healthy diet and lifestyle.  Insurance will cover physical each calendar year Prostate screening Q2 years.    Diabetes mellitus type 2, controlled, with complications     Chronic, stable. Continue meds. Encouraged weight loss.    Chronic kidney disease, stage 3, mod decreased GFR     I have requested he send me faxed recent Cr. Discussed avoiding NSAIDs and good hydration status.    CAD (coronary artery disease)     asxs        Follow up plan: Return in about 6 months (around 11/26/2014), or as needed, for follow up visit.

## 2014-05-26 NOTE — Assessment & Plan Note (Signed)
Along face - anticipate atopic dermatitis. Prescribed protopic today which has helped in past.

## 2014-05-26 NOTE — Assessment & Plan Note (Signed)
Chronic, stable. Continue meds. Encouraged weight loss.

## 2014-05-26 NOTE — Assessment & Plan Note (Signed)
Sciatica vs ?postherpetic neuralgia after rash-less shingles. Provided with stretches for piriformis syndrome. If persistent discomfort, discussed trial of gabapentin.

## 2014-05-27 ENCOUNTER — Encounter: Payer: Self-pay | Admitting: *Deleted

## 2014-06-06 ENCOUNTER — Encounter: Payer: Self-pay | Admitting: *Deleted

## 2014-06-10 ENCOUNTER — Other Ambulatory Visit: Payer: Self-pay

## 2014-06-10 MED ORDER — ACYCLOVIR 400 MG PO TABS: 400.0000 mg | ORAL_TABLET | Freq: Two times a day (BID) | ORAL | Status: DC

## 2014-06-10 NOTE — Telephone Encounter (Signed)
Patient notified

## 2014-06-10 NOTE — Telephone Encounter (Signed)
Pt left /vm requesting refill acyclovir to [facility] [address].Please advise. Pt request cb when refilled.

## 2014-06-10 NOTE — Telephone Encounter (Signed)
Sent in.plz notify pt.  

## 2014-06-17 ENCOUNTER — Telehealth: Payer: Self-pay

## 2014-06-17 MED ORDER — ATENOLOL-CHLORTHALIDONE 50-25 MG PO TABS: 1.0000 | ORAL_TABLET | Freq: Every day | ORAL | Status: DC

## 2014-06-17 MED ORDER — QUINAPRIL HCL 40 MG PO TABS: 40.0000 mg | ORAL_TABLET | Freq: Every day | ORAL | Status: DC

## 2014-06-17 NOTE — Telephone Encounter (Signed)
Pt request refill atenolol-chlorthalidone, and quinapril to [facility]. Pt notified done.

## 2014-06-17 NOTE — Telephone Encounter (Signed)
Pt left vm requesting refill for quinapril and another med that I could not understand name of med; left v/m for pt to cb to get name of 2nd med. And verify pharmacy.

## 2014-08-01 ENCOUNTER — Other Ambulatory Visit: Payer: Self-pay | Admitting: *Deleted

## 2014-08-01 MED ORDER — ACYCLOVIR 400 MG PO TABS: 400.0000 mg | ORAL_TABLET | Freq: Two times a day (BID) | ORAL | Status: DC

## 2014-08-01 MED ORDER — LOVASTATIN 40 MG PO TABS: 80.0000 mg | ORAL_TABLET | Freq: Every day | ORAL | Status: DC

## 2014-08-01 MED ORDER — ATENOLOL-CHLORTHALIDONE 50-25 MG PO TABS: 1.0000 | ORAL_TABLET | Freq: Every day | ORAL | Status: DC

## 2014-08-01 MED ORDER — METFORMIN HCL ER 500 MG PO TB24: 500.0000 mg | ORAL_TABLET | Freq: Two times a day (BID) | ORAL | Status: DC

## 2014-08-01 MED ORDER — QUINAPRIL HCL 40 MG PO TABS
40.0000 mg | ORAL_TABLET | Freq: Every day | ORAL | Status: DC
Start: 2014-08-01 — End: 2014-08-02

## 2014-08-02 ENCOUNTER — Other Ambulatory Visit: Payer: Self-pay | Admitting: *Deleted

## 2014-08-02 MED ORDER — METFORMIN HCL ER 500 MG PO TB24: 500.0000 mg | ORAL_TABLET | Freq: Two times a day (BID) | ORAL | Status: DC

## 2014-08-02 MED ORDER — ATENOLOL-CHLORTHALIDONE 50-25 MG PO TABS: 1.0000 | ORAL_TABLET | Freq: Every day | ORAL | Status: DC

## 2014-08-02 MED ORDER — QUINAPRIL HCL 40 MG PO TABS: 40.0000 mg | ORAL_TABLET | Freq: Every day | ORAL | Status: DC

## 2014-09-19 LAB — HM DIABETES EYE EXAM

## 2014-09-28 ENCOUNTER — Encounter: Payer: Self-pay | Admitting: Family Medicine

## 2014-10-05 ENCOUNTER — Encounter: Payer: Self-pay | Admitting: Family Medicine

## 2014-12-09 ENCOUNTER — Other Ambulatory Visit: Payer: Self-pay | Admitting: Family Medicine

## 2015-04-06 ENCOUNTER — Ambulatory Visit (INDEPENDENT_AMBULATORY_CARE_PROVIDER_SITE_OTHER): Payer: Commercial Managed Care - HMO | Admitting: Podiatry

## 2015-04-06 ENCOUNTER — Ambulatory Visit (INDEPENDENT_AMBULATORY_CARE_PROVIDER_SITE_OTHER): Payer: Commercial Managed Care - HMO

## 2015-04-06 ENCOUNTER — Encounter: Payer: Self-pay | Admitting: Podiatry

## 2015-04-06 VITALS — BP 182/93 | HR 73 | Resp 17 | Ht 69.0 in | Wt 245.0 lb

## 2015-04-06 DIAGNOSIS — M722 Plantar fascial fibromatosis: Secondary | ICD-10-CM

## 2015-04-06 MED ORDER — TRIAMCINOLONE ACETONIDE 10 MG/ML IJ SUSP
10.0000 mg | Freq: Once | INTRAMUSCULAR | Status: AC
Start: 2015-04-06 — End: 2015-04-06
  Administered 2015-04-06: 10 mg

## 2015-04-06 NOTE — Patient Instructions (Signed)
Plantar Fasciitis (Heel Spur Syndrome) with Rehab The plantar fascia is a fibrous, ligament-like, soft-tissue structure that spans the bottom of the foot. Plantar fasciitis is a condition that causes pain in the foot due to inflammation of the tissue. SYMPTOMS   Pain and tenderness on the underneath side of the foot.  Pain that worsens with standing or walking. CAUSES  Plantar fasciitis is caused by irritation and injury to the plantar fascia on the underneath side of the foot. Common mechanisms of injury include:  Direct trauma to bottom of the foot.  Damage to a small nerve that runs under the foot where the main fascia attaches to the heel bone.  Stress placed on the plantar fascia due to bone spurs. RISK INCREASES WITH:   Activities that place stress on the plantar fascia (running, jumping, pivoting, or cutting).  Poor strength and flexibility.  Improperly fitted shoes.  Tight calf muscles.  Flat feet.  Failure to warm-up properly before activity.  Obesity. PREVENTION  Warm up and stretch properly before activity.  Allow for adequate recovery between workouts.  Maintain physical fitness:  Strength, flexibility, and endurance.  Cardiovascular fitness.  Maintain a health body weight.  Avoid stress on the plantar fascia.  Wear properly fitted shoes, including arch supports for individuals who have flat feet. PROGNOSIS  If treated properly, then the symptoms of plantar fasciitis usually resolve without surgery. However, occasionally surgery is necessary. RELATED COMPLICATIONS   Recurrent symptoms that may result in a chronic condition.  Problems of the lower back that are caused by compensating for the injury, such as limping.  Pain or weakness of the foot during push-off following surgery.  Chronic inflammation, scarring, and partial or complete fascia tear, occurring more often from repeated injections. TREATMENT  Treatment initially involves the use of  ice and medication to help reduce pain and inflammation. The use of strengthening and stretching exercises may help reduce pain with activity, especially stretches of the Achilles tendon. These exercises may be performed at home or with a therapist. Your caregiver may recommend that you use heel cups of arch supports to help reduce stress on the plantar fascia. Occasionally, corticosteroid injections are given to reduce inflammation. If symptoms persist for greater than 6 months despite non-surgical (conservative), then surgery may be recommended.  MEDICATION   If pain medication is necessary, then nonsteroidal anti-inflammatory medications, such as aspirin and ibuprofen, or other minor pain relievers, such as acetaminophen, are often recommended.  Do not take pain medication within 7 days before surgery.  Prescription pain relievers may be given if deemed necessary by your caregiver. Use only as directed and only as much as you need.  Corticosteroid injections may be given by your caregiver. These injections should be reserved for the most serious cases, because they may only be given a certain number of times. HEAT AND COLD  Cold treatment (icing) relieves pain and reduces inflammation. Cold treatment should be applied for 10 to 15 minutes every 2 to 3 hours for inflammation and pain and immediately after any activity that aggravates your symptoms. Use ice packs or massage the area with a piece of ice (ice massage).  Heat treatment may be used prior to performing the stretching and strengthening activities prescribed by your caregiver, physical therapist, or athletic trainer. Use a heat pack or soak the injury in warm water. SEEK IMMEDIATE MEDICAL CARE IF:  Treatment seems to offer no benefit, or the condition worsens.  Any medications produce adverse side effects. EXERCISES RANGE   OF MOTION (ROM) AND STRETCHING EXERCISES - Plantar Fasciitis (Heel Spur Syndrome) These exercises may help you  when beginning to rehabilitate your injury. Your symptoms may resolve with or without further involvement from your physician, physical therapist or athletic trainer. While completing these exercises, remember:   Restoring tissue flexibility helps normal motion to return to the joints. This allows healthier, less painful movement and activity.  An effective stretch should be held for at least 30 seconds.  A stretch should never be painful. You should only feel a gentle lengthening or release in the stretched tissue. RANGE OF MOTION - Toe Extension, Flexion  Sit with your right / left leg crossed over your opposite knee.  Grasp your toes and gently pull them back toward the top of your foot. You should feel a stretch on the bottom of your toes and/or foot.  Hold this stretch for __________ seconds.  Now, gently pull your toes toward the bottom of your foot. You should feel a stretch on the top of your toes and or foot.  Hold this stretch for __________ seconds. Repeat __________ times. Complete this stretch __________ times per day.  RANGE OF MOTION - Ankle Dorsiflexion, Active Assisted  Remove shoes and sit on a chair that is preferably not on a carpeted surface.  Place right / left foot under knee. Extend your opposite leg for support.  Keeping your heel down, slide your right / left foot back toward the chair until you feel a stretch at your ankle or calf. If you do not feel a stretch, slide your bottom forward to the edge of the chair, while still keeping your heel down.  Hold this stretch for __________ seconds. Repeat __________ times. Complete this stretch __________ times per day.  STRETCH - Gastroc, Standing  Place hands on wall.  Extend right / left leg, keeping the front knee somewhat bent.  Slightly point your toes inward on your back foot.  Keeping your right / left heel on the floor and your knee straight, shift your weight toward the wall, not allowing your back to  arch.  You should feel a gentle stretch in the right / left calf. Hold this position for __________ seconds. Repeat __________ times. Complete this stretch __________ times per day. STRETCH - Soleus, Standing  Place hands on wall.  Extend right / left leg, keeping the other knee somewhat bent.  Slightly point your toes inward on your back foot.  Keep your right / left heel on the floor, bend your back knee, and slightly shift your weight over the back leg so that you feel a gentle stretch deep in your back calf.  Hold this position for __________ seconds. Repeat __________ times. Complete this stretch __________ times per day. STRETCH - Gastrocsoleus, Standing  Note: This exercise can place a lot of stress on your foot and ankle. Please complete this exercise only if specifically instructed by your caregiver.   Place the ball of your right / left foot on a step, keeping your other foot firmly on the same step.  Hold on to the wall or a rail for balance.  Slowly lift your other foot, allowing your body weight to press your heel down over the edge of the step.  You should feel a stretch in your right / left calf.  Hold this position for __________ seconds.  Repeat this exercise with a slight bend in your right / left knee. Repeat __________ times. Complete this stretch __________ times per day.    STRENGTHENING EXERCISES - Plantar Fasciitis (Heel Spur Syndrome)  These exercises may help you when beginning to rehabilitate your injury. They may resolve your symptoms with or without further involvement from your physician, physical therapist or athletic trainer. While completing these exercises, remember:   Muscles can gain both the endurance and the strength needed for everyday activities through controlled exercises.  Complete these exercises as instructed by your physician, physical therapist or athletic trainer. Progress the resistance and repetitions only as guided. STRENGTH -  Towel Curls  Sit in a chair positioned on a non-carpeted surface.  Place your foot on a towel, keeping your heel on the floor.  Pull the towel toward your heel by only curling your toes. Keep your heel on the floor.  If instructed by your physician, physical therapist or athletic trainer, add ____________________ at the end of the towel. Repeat __________ times. Complete this exercise __________ times per day. STRENGTH - Ankle Inversion  Secure one end of a rubber exercise band/tubing to a fixed object (table, pole). Loop the other end around your foot just before your toes.  Place your fists between your knees. This will focus your strengthening at your ankle.  Slowly, pull your big toe up and in, making sure the band/tubing is positioned to resist the entire motion.  Hold this position for __________ seconds.  Have your muscles resist the band/tubing as it slowly pulls your foot back to the starting position. Repeat __________ times. Complete this exercises __________ times per day.  Document Released: 10/14/2005 Document Revised: 01/06/2012 Document Reviewed: 01/26/2009 Exit[employer] Patient Information 2015 [employer]. This information is not intended to replace advice given to you by your health care provider. Make sure you discuss any questions you have with your health care provider.  

## 2015-04-10 ENCOUNTER — Encounter: Payer: Self-pay | Admitting: Podiatry

## 2015-04-10 NOTE — Progress Notes (Signed)
Subjective:     Patient ID: Thomas Carlson, male   DOB: [birth year], 69 y.o.   MRN: [id number]  HPI 69 year old male presents the office with complaints of right heel pain which has been ongoing for several weeks. He states he has pain in the morning or after periods of inactivity which is relieved by ambulation. He denies any numbness or tingling. Denies any swelling or redness. Denies any history of injury or trauma or any change or increased activities, onset of symptoms. He has diabetic since his last blood sugar is 115. No other complaints at this time.  Review of Systems  All other systems reviewed and are negative.      Objective:   Physical Exam AAO x3, NAD DP/PT pulses palpable bilaterally, CRT less than 3 seconds Protective sensation intact with Simms Weinstein monofilament, vibratory sensation intact, Achilles tendon reflex intact; negative tinel sign Tenderness to palpation overlying the plantar medial tubercle of the calcaneus to right heel at the insertion of the plantar fascia. There is no pain along the course of plantar fascial within the arch of the foot. There is no pain with lateral compression of the calcaneus or pain the vibratory sensation. No pain on the posterior aspect of the calcaneus or along the course/insertion of the Achilles tendon. There is no overlying edema, erythema, increase in warmth. No other areas of tenderness palpation or pain with vibratory sensation to the foot/ankle. MMT 5/5, ROM WNL No open lesions or pre-ulcerative lesions are identified. No pain with calf compression, swelling, warmth, erythema.     Assessment:     69 year old male with right heel pain, likely plantar fasciitis    Plan:     -X-rays were obtained and reviewed with the patient.  -Treatment options discussed including all alternatives, risks, and complications Patient elects to proceed with steroid injection into the right heel. Under sterile skin preparation, a total of 2.5cc  of kenalog 10, 0.5% Marcaine plain, and 2% lidocaine plain were infiltrated into the symptomatic area without complication. A band-aid was applied. Patient tolerated the injection well without complication. Post-injection care with discussed with the patient. Discussed with the patient to ice the area over the next couple of days to help prevent a steroid flare. Monitor BS.  -Dispensed plantar fascial brace  -Ice to the area -Stretching exercises on a consistent basis  -Discussed shoe gear modifications. Recommended operative barefoot. Discussed orthotics. --Follow-up 3 weeks or sooner if any problems arise. In the meantime, encouraged to call the office with any questions, concerns, change in symptoms.

## 2015-04-18 ENCOUNTER — Telehealth: Payer: Self-pay

## 2015-04-18 NOTE — Telephone Encounter (Signed)
Diabetic Bundle. Left voicemail advising his A1C blood test is due. Pt advised to contact PCP's office to scheduled.

## 2015-04-24 ENCOUNTER — Ambulatory Visit (INDEPENDENT_AMBULATORY_CARE_PROVIDER_SITE_OTHER): Payer: Commercial Managed Care - HMO | Admitting: Family Medicine

## 2015-04-24 ENCOUNTER — Encounter: Payer: Self-pay | Admitting: Family Medicine

## 2015-04-24 VITALS — BP 118/68 | HR 81 | Temp 98.3°F | Wt 251.0 lb

## 2015-04-24 DIAGNOSIS — L03119 Cellulitis of unspecified part of limb: Secondary | ICD-10-CM

## 2015-04-24 MED ORDER — INDOMETHACIN 50 MG PO CAPS: 50.0000 mg | ORAL_CAPSULE | Freq: Two times a day (BID) | ORAL | Status: DC | PRN

## 2015-04-24 MED ORDER — DOXYCYCLINE HYCLATE 100 MG PO TABS: 100.0000 mg | ORAL_TABLET | Freq: Two times a day (BID) | ORAL | Status: DC

## 2015-04-24 NOTE — Progress Notes (Signed)
Pre visit review using our clinic review tool, if applicable. No additional management support is needed unless otherwise documented below in the visit note.  Stubbed L 1st toe recently.  Was bleeding some.  He cleaned it up.  This was 8 days ago.  In the meantime, his R first toe has gotten warm in the meantime- last few days.  This doesn't feel exactly like a prev gout flare- that was more local and more painful- even the bed sheet at night.  No FCNAVD.  L ankle isn't ttp, no similar R foot sx.    Meds, vitals, and allergies reviewed.   ROS: See HPI.  Otherwise, noncontributory.  nad L foot with old abrasion as the L 1st toe w/u pus, at the distal portion of the nail.  Skin over L 1st toe and distal medial midfoot puffy and warm but not fluctuant mass and no joint line is specifically ttp Normal BP pulse.

## 2015-04-24 NOTE — Patient Instructions (Signed)
Presumed skin infection, start doxy- sun caution.  Indomethacin for pain with food.   Update [place] as needed (fevers, spreading redness). Take care.

## 2015-04-25 DIAGNOSIS — L03119 Cellulitis of unspecified part of limb: Secondary | ICD-10-CM | POA: Insufficient documentation

## 2015-04-25 NOTE — Assessment & Plan Note (Signed)
Presumed superficial skin infection, start doxy- sun caution. Indomethacin for pain with food.  Update [place] as needed (fevers, spreading redness). Not concerned for osteo or systemic illness.  D/w pt, well appearing.  He agrees.

## 2015-04-27 ENCOUNTER — Ambulatory Visit: Payer: Commercial Managed Care - HMO | Admitting: Podiatry

## 2015-05-19 ENCOUNTER — Other Ambulatory Visit: Payer: Self-pay | Admitting: *Deleted

## 2015-05-19 MED ORDER — METFORMIN HCL ER 500 MG PO TB24: 500.0000 mg | ORAL_TABLET | Freq: Two times a day (BID) | ORAL | Status: DC

## 2015-06-13 ENCOUNTER — Other Ambulatory Visit: Payer: Self-pay | Admitting: Family Medicine

## 2015-06-13 NOTE — Telephone Encounter (Signed)
Pt has CPX scheduled for 07/13/15 and pt has enough med to last until after sees Dr Darnell Level to make sure will continue these 2 meds; OK with pt to wait until after has appt to refill these meds.

## 2015-07-13 ENCOUNTER — Ambulatory Visit (INDEPENDENT_AMBULATORY_CARE_PROVIDER_SITE_OTHER): Payer: Commercial Managed Care - HMO | Admitting: Family Medicine

## 2015-07-13 ENCOUNTER — Encounter: Payer: Self-pay | Admitting: Family Medicine

## 2015-07-13 ENCOUNTER — Other Ambulatory Visit: Payer: Self-pay | Admitting: Family Medicine

## 2015-07-13 ENCOUNTER — Ambulatory Visit (INDEPENDENT_AMBULATORY_CARE_PROVIDER_SITE_OTHER)
Admission: RE | Admit: 2015-07-13 | Discharge: 2015-07-13 | Disposition: A | Discharge status: Home / Self Care | Payer: Commercial Managed Care - HMO | Source: Ambulatory Visit | Attending: Family Medicine | Admitting: Family Medicine

## 2015-07-13 VITALS — BP 130/70 | HR 65 | Temp 98.4°F | Ht 69.0 in | Wt 244.0 lb

## 2015-07-13 DIAGNOSIS — Z1159 Encounter for screening for other viral diseases: Secondary | ICD-10-CM

## 2015-07-13 DIAGNOSIS — M79671 Pain in right foot: Secondary | ICD-10-CM

## 2015-07-13 DIAGNOSIS — N183 Chronic kidney disease, stage 3 unspecified: Secondary | ICD-10-CM

## 2015-07-13 DIAGNOSIS — Z7189 Other specified counseling: Secondary | ICD-10-CM | POA: Insufficient documentation

## 2015-07-13 DIAGNOSIS — IMO0001 Reserved for inherently not codable concepts without codable children: Secondary | ICD-10-CM

## 2015-07-13 DIAGNOSIS — E785 Hyperlipidemia, unspecified: Secondary | ICD-10-CM

## 2015-07-13 DIAGNOSIS — Z0000 Encounter for general adult medical examination without abnormal findings: Secondary | ICD-10-CM | POA: Insufficient documentation

## 2015-07-13 DIAGNOSIS — Z125 Encounter for screening for malignant neoplasm of prostate: Secondary | ICD-10-CM

## 2015-07-13 DIAGNOSIS — E559 Vitamin D deficiency, unspecified: Secondary | ICD-10-CM | POA: Diagnosis not present

## 2015-07-13 DIAGNOSIS — E118 Type 2 diabetes mellitus with unspecified complications: Secondary | ICD-10-CM | POA: Diagnosis not present

## 2015-07-13 DIAGNOSIS — I10 Essential (primary) hypertension: Secondary | ICD-10-CM

## 2015-07-13 DIAGNOSIS — M19071 Primary osteoarthritis, right ankle and foot: Secondary | ICD-10-CM | POA: Diagnosis not present

## 2015-07-13 DIAGNOSIS — I2510 Atherosclerotic heart disease of native coronary artery without angina pectoris: Secondary | ICD-10-CM

## 2015-07-13 DIAGNOSIS — R202 Paresthesia of skin: Secondary | ICD-10-CM

## 2015-07-13 DIAGNOSIS — M109 Gout, unspecified: Secondary | ICD-10-CM

## 2015-07-13 LAB — LIPID PANEL
CHOLESTEROL: 138 mg/dL (ref 0–200)
HDL: 39.7 mg/dL (ref >39.00)
LDL Cholesterol: 66 mg/dL (ref 0–99)
NonHDL: 98.77
TRIGLYCERIDES: 165 mg/dL — AB (ref 0.0–149.0)
Total CHOL/HDL Ratio: 3
VLDL: 33 mg/dL (ref 0.0–40.0)

## 2015-07-13 LAB — CBC WITH DIFFERENTIAL/PLATELET
BASOS ABS: 0 10*3/uL (ref 0.0–0.1)
Basophils Relative: 0.5 % (ref 0.0–3.0)
EOS PCT: 3.6 % (ref 0.0–5.0)
Eosinophils Absolute: 0.2 10*3/uL (ref 0.0–0.7)
HEMATOCRIT: 44.5 % (ref 39.0–52.0)
Hemoglobin: 14.6 g/dL (ref 13.0–17.0)
LYMPHS PCT: 21.7 % (ref 12.0–46.0)
Lymphs Abs: 1.4 10*3/uL (ref 0.7–4.0)
MCHC: 32.9 g/dL (ref 30.0–36.0)
MCV: 95.9 fl (ref 78.0–100.0)
MONOS PCT: 9.3 % (ref 3.0–12.0)
Monocytes Absolute: 0.6 10*3/uL (ref 0.1–1.0)
NEUTROS ABS: 4.1 10*3/uL (ref 1.4–7.7)
Neutrophils Relative %: 64.9 % (ref 43.0–77.0)
PLATELETS: 223 10*3/uL (ref 150.0–400.0)
RBC: 4.64 Mil/uL (ref 4.22–5.81)
RDW: 14.6 % (ref 11.5–15.5)
WBC: 6.4 10*3/uL (ref 4.0–10.5)

## 2015-07-13 LAB — HEMOGLOBIN A1C: Hgb A1c MFr Bld: 6.3 % (ref 4.6–6.5)

## 2015-07-13 LAB — COMPREHENSIVE METABOLIC PANEL
ALT: 14 U/L (ref 0–53)
AST: 17 U/L (ref 0–37)
Albumin: 4.4 g/dL (ref 3.5–5.2)
Alkaline Phosphatase: 59 U/L (ref 39–117)
BUN: 36 mg/dL — AB (ref 6–23)
CHLORIDE: 102 meq/L (ref 96–112)
CO2: 30 meq/L (ref 19–32)
CREATININE: 1.53 mg/dL — AB (ref 0.40–1.50)
Calcium: 9.7 mg/dL (ref 8.4–10.5)
GFR: 48.04 mL/min — ABNORMAL LOW (ref >60.00)
GLUCOSE: 109 mg/dL — AB (ref 70–99)
Potassium: 4.6 mEq/L (ref 3.5–5.1)
SODIUM: 139 meq/L (ref 135–145)
Total Bilirubin: 0.6 mg/dL (ref 0.2–1.2)
Total Protein: 6.8 g/dL (ref 6.0–8.3)

## 2015-07-13 LAB — VITAMIN B12: VITAMIN B 12: 251 pg/mL (ref 211–911)

## 2015-07-13 LAB — PSA, MEDICARE: PSA: 0.18 ng/mL (ref 0.10–4.00)

## 2015-07-13 LAB — VITAMIN D 25 HYDROXY (VIT D DEFICIENCY, FRACTURES): VITD: 72.21 ng/mL (ref 30.00–100.00)

## 2015-07-13 LAB — URIC ACID: Uric Acid, Serum: 9.3 mg/dL — ABNORMAL HIGH (ref 4.0–7.8)

## 2015-07-13 MED ORDER — ACYCLOVIR 400 MG PO TABS: 400.0000 mg | ORAL_TABLET | Freq: Two times a day (BID) | ORAL | Status: DC

## 2015-07-13 MED ORDER — LOVASTATIN 40 MG PO TABS: 80.0000 mg | ORAL_TABLET | Freq: Every day | ORAL | Status: DC

## 2015-07-13 MED ORDER — QUINAPRIL HCL 40 MG PO TABS: 40.0000 mg | ORAL_TABLET | Freq: Every day | ORAL | Status: DC

## 2015-07-13 MED ORDER — ATENOLOL-CHLORTHALIDONE 50-25 MG PO TABS: 1.0000 | ORAL_TABLET | Freq: Every day | ORAL | Status: DC

## 2015-07-13 MED ORDER — METFORMIN HCL ER 500 MG PO TB24
500.0000 mg | ORAL_TABLET | Freq: Two times a day (BID) | ORAL | Status: DC
Start: 2015-07-13 — End: 2016-07-12

## 2015-07-13 NOTE — Assessment & Plan Note (Signed)

## 2015-07-13 NOTE — Assessment & Plan Note (Signed)
Recheck today. On vit D 1000 IU daily.

## 2015-07-13 NOTE — Assessment & Plan Note (Addendum)
Chronic, well controlled. Check A1c today. Foot exam today.

## 2015-07-13 NOTE — Assessment & Plan Note (Signed)
No recurrent flares (about one every 2 yrs). Check uric acid today. On indocin PRN.

## 2015-07-13 NOTE — Progress Notes (Signed)
Pre visit review using our clinic review tool, if applicable. No additional management support is needed unless otherwise documented below in the visit note. 

## 2015-07-13 NOTE — Assessment & Plan Note (Signed)
Advanced directive - has living will at home - will check on this. HCPOA - wife.

## 2015-07-13 NOTE — Assessment & Plan Note (Signed)
Chronic, stable. Continue lovastatin. Goal LDL <70 given personal hx CAD.

## 2015-07-13 NOTE — Assessment & Plan Note (Signed)
Recheck Cr today along with CBC and vit D.

## 2015-07-13 NOTE — Progress Notes (Signed)
BP 130/70 mmHg  Pulse 65  Temp(Src) 98.4 F (36.9 C) (Tympanic)  Ht 5\' 9"  (1.753 m)  Wt 244 lb (110.678 kg)  BMI 36.02 kg/m2  SpO2 97%   CC: medicare wellness visit  Subjective:    Patient ID: Thomas Carlson, male    DOB: 04/14/1946, 69 y.o.   MRN: [id number]  HPI: Thomas Carlson is a 69 y.o. male presenting on 07/13/2015 for Annual Exam   Retired, received medicare /2015. 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 Wife recently diagnosed with breast cancer - currently undergoing chemo.   Plantar fasciitis - saw podiatrist at [facility]. H/o gout as well (indocin prn). Flares every 2 years. Walking is form of exercise - feeling R dorsal foot pain whenever he walks or goes up stairs. Ongoing issue since 02/2015.   DM - sugars running overall ok. Tries to follow diabetic diet. Last cbg 1 wk ago - 122 fasting. Occasional paresthesias in feet.  No results found for: HGBA1C   CAD s/p stent placement   Hearing screen - whisper test passed Vision screen - with [facility] [place] - passed Depression - passed  Preventative: Colonoscopy - done 2009, normal, good for 10 yrs . [facility])  Prostate - discussed requests screening QO year - due this year  Flu shot at [facility] - 9/20 scheduled Pneumovax 06/2012, prevnar 2015. Tetanus - 2012  Shingle shot 2011  Advanced directive - has living will at home - will check on this. HCPOA - wife.  Seat belt use discuss 100%  Sunscreen use discussed, no changing moles on skin. Dermatologist retired Geophysicist/field seismologist).   Caffeine: 2 cans of diet soda/day  Lives with wife. 1 dog at home. 2 grown children  Occupation: Advertising copywriter at [employer], current [employer] 2015 Edu: Master's degree  Activity: walking (fit bit), limited by R foot pain Diet: some water, lots of diet sodas, fruits/vegetables occasionally, cutting back on carbs  Relevant past medical, surgical, family and social history reviewed and updated as  indicated. Interim medical history since our last visit reviewed. Allergies and medications reviewed and updated. Current Outpatient Prescriptions on File Prior to Visit  Medication Sig  . aspirin 81 MG tablet Take 81 mg by mouth daily.  . cholecalciferol (VITAMIN D) 1000 UNITS tablet Take 1,000 Units by mouth daily.  . Multiple Vitamin (MULTIVITAMIN) tablet Take 1 tablet by mouth daily.  . Omega-3 Fatty Acids (FISH OIL) 1360 MG CAPS Take 1 capsule by mouth 2 (two) times daily.   No current facility-administered medications on file prior to visit.    Review of Systems  Constitutional: Negative for fever, chills, activity change, appetite change, fatigue and unexpected weight change.  HENT: Negative for hearing loss.   Eyes: Negative for visual disturbance.  Respiratory: Negative for cough, chest tightness, shortness of breath and wheezing.   Cardiovascular: Negative for chest pain, palpitations and leg swelling.  Gastrointestinal: Negative for nausea, vomiting, abdominal pain, diarrhea, constipation, blood in stool and abdominal distention.  Genitourinary: Negative for hematuria and difficulty urinating.  Musculoskeletal: Negative for myalgias, arthralgias and neck pain.  Skin: Negative for rash.  Neurological: Negative for dizziness, seizures, syncope and headaches.  Hematological: Negative for adenopathy. Does not bruise/bleed easily.  Psychiatric/Behavioral: Negative for dysphoric mood. The patient is not nervous/anxious.    Per HPI unless specifically indicated above     Objective:    BP 130/70 mmHg  Pulse 65  Temp(Src) 98.4 F (  36.9 C) (Tympanic)  Ht 5\' 9"  (1.753 m)  Wt 244 lb (110.678 kg)  BMI 36.02 kg/m2  SpO2 97%  Wt Readings from Last 3 Encounters:  07/13/15 244 lb (110.678 kg)  04/24/15 251 lb (113.853 kg)  04/06/15 245 lb (111.131 kg)    Physical Exam  Constitutional: He is oriented to person, place, and time. He appears well-developed and well-nourished. No  distress.  HENT:  Head: Normocephalic and atraumatic.  Right Ear: Hearing, tympanic membrane, external ear and ear canal normal.  Left Ear: Hearing, tympanic membrane, external ear and ear canal normal.  Nose: Nose normal.  Mouth/Throat: Uvula is midline, oropharynx is clear and moist and mucous membranes are normal. No oropharyngeal exudate, posterior oropharyngeal edema or posterior oropharyngeal erythema.  Eyes: Conjunctivae and EOM are normal. Pupils are equal, round, and reactive to light. No scleral icterus.  Neck: Normal range of motion. Neck supple. Carotid bruit is not present. No thyromegaly present.  Cardiovascular: Normal rate, regular rhythm and intact distal pulses.   Murmur (3/6 SEM best at upper sternal border) heard. Pulses:      Radial pulses are 2+ on the right side, and 2+ on the left side.  Pulmonary/Chest: Effort normal and breath sounds normal. No respiratory distress. He has no wheezes. He has no rales.  Abdominal: Soft. Bowel sounds are normal. He exhibits no distension and no mass. There is no tenderness. There is no rebound and no guarding.  Genitourinary: Rectum normal and prostate normal. Rectal exam shows no external hemorrhoid, no internal hemorrhoid, no fissure, no mass, no tenderness and anal tone normal. Prostate is not enlarged (10-15gm) and not tender.  Musculoskeletal: Normal range of motion. He exhibits no edema.  Foot exam done R dorsal foot pain between 2nd and 1st MT mid shafts, no pain at actual shaft. No 5th base MT pain, no navicular pain, no pain at soles  Lymphadenopathy:    He has no cervical adenopathy.  Neurological: He is alert and oriented to person, place, and time.  CN grossly intact, station and gait intact Recall 3/3 Calculation 3/5 serial 7s  Skin: Skin is warm and dry. No rash noted.  Psychiatric: He has a normal mood and affect. His behavior is normal. Judgment and thought content normal.  Nursing note and vitals  reviewed.  Results for orders placed or performed in visit on 10/05/14  HM DIABETES EYE EXAM  Result Value Ref Range   HM Diabetic Eye Exam No Retinopathy No Retinopathy      Assessment & Plan:   Problem List Items Addressed This Visit    Diabetes mellitus type 2, controlled, with complications    Chronic, well controlled. Check A1c today. Foot exam today.      Relevant Medications   quinapril (ACCUPRIL) 40 MG tablet   metFORMIN (GLUCOPHAGE-XR) 500 MG 24 hr tablet   lovastatin (MEVACOR) 40 MG tablet   atenolol-chlorthalidone (TENORETIC) 50-25 MG per tablet   Other Relevant Orders   Hemoglobin A1c   HTN (hypertension)    Chronic, stable. Continue current regimen.      Relevant Medications   quinapril (ACCUPRIL) 40 MG tablet   lovastatin (MEVACOR) 40 MG tablet   atenolol-chlorthalidone (TENORETIC) 50-25 MG per tablet   HLD (hyperlipidemia)    Chronic, stable. Continue lovastatin. Goal LDL <70 given personal hx CAD.      Relevant Medications   quinapril (ACCUPRIL) 40 MG tablet   lovastatin (MEVACOR) 40 MG tablet   atenolol-chlorthalidone (TENORETIC) 50-25 MG per tablet  Other Relevant Orders   Lipid panel   Comprehensive metabolic panel   CAD (coronary artery disease)    Stable, asxs. Continue medical management.      Relevant Medications   quinapril (ACCUPRIL) 40 MG tablet   lovastatin (MEVACOR) 40 MG tablet   atenolol-chlorthalidone (TENORETIC) 50-25 MG per tablet   Gout    No recurrent flares (about one every 2 yrs). Check uric acid today. On indocin PRN.      Relevant Orders   Uric acid   Obesity, Class II, BMI 35-39.9, with comorbidity    Encouraged continue regular exercise in routine.      Relevant Medications   metFORMIN (GLUCOPHAGE-XR) 500 MG 24 hr tablet   Chronic kidney disease, stage 3, mod decreased GFR    Recheck Cr today along with CBC and vit D.      Relevant Orders   Comprehensive metabolic panel   CBC with Differential/Platelet    Healthcare maintenance    Preventative protocols reviewed and updated unless pt declined. Discussed healthy diet and lifestyle.       Vitamin D deficiency    Recheck today. On vit D 1000 IU daily.      Relevant Orders   Vit D  25 hydroxy (rtn osteoporosis monitoring)   Medicare annual wellness visit, initial - Primary    I have personally reviewed the Medicare Annual Wellness questionnaire and have noted 1. The patient's medical and social history 2. Their use of alcohol, tobacco or illicit drugs 3. Their current medications and supplements 4. The patient's functional ability including ADL's, fall risks, home safety risks and hearing or visual impairment. Cognitive function has been assessed and addressed as indicated.  5. Diet and physical activity 6. Evidence for depression or mood disorders The patients weight, height, BMI have been recorded in the chart. I have made referrals, counseling and provided education to the patient based on review of the above and I have provided the pt with a written personalized care plan for preventive services. Provider list updated.. See scanned questionairre as needed for further documentation. Reviewed preventative protocols and updated unless pt declined.       Advanced care planning/counseling discussion    Advanced directive - has living will at home - will check on this. HCPOA - wife.       Right foot pain    Actually pain seems to be in soft tissue between 1st/2nd MT shafts R foot. ?bursitis. Check xray today to r/o stress fracture, if normal, rec f/u with podiatry.      Relevant Orders   DG Foot Complete Right    Other Visit Diagnoses    Need for hepatitis C screening test        Relevant Orders    Hepatitis C antibody, reflex    Special screening for malignant neoplasm of prostate        Relevant Orders    PSA, Medicare    Paresthesias        Relevant Orders    Vitamin B12        Follow up plan: Return in about 1 year  (around 07/12/2016), or as needed, for medicare wellness visit.

## 2015-07-13 NOTE — Assessment & Plan Note (Signed)
Preventative protocols reviewed and updated unless pt declined. Discussed healthy diet and lifestyle.  

## 2015-07-13 NOTE — Assessment & Plan Note (Signed)
Actually pain seems to be in soft tissue between 1st/2nd MT shafts R foot. ?bursitis. Check xray today to r/o stress fracture, if normal, rec f/u with podiatry.

## 2015-07-13 NOTE — Assessment & Plan Note (Signed)
Stable, asxs. Continue medical management.

## 2015-07-13 NOTE — Assessment & Plan Note (Addendum)
Encouraged continue regular exercise in routine.

## 2015-07-13 NOTE — Patient Instructions (Addendum)
Let us know when you receive flu shot to update your chart Labwork today. Xray today. Check on advanced directives at home, bring me copy to update your chart. Return as needed or 1 year for next medicare wellness visit  Health Maintenance A healthy lifestyle and preventative care can promote health and wellness.  Maintain regular health, dental, and eye exams.  Eat a healthy diet. Foods like vegetables, fruits, whole grains, low-fat dairy products, and lean protein foods contain the nutrients you need and are low in calories. Decrease your intake of foods high in solid fats, added sugars, and salt. Get information about a proper diet from your health care provider, if necessary.  Regular physical exercise is one of the most important things you can do for your health. Most adults should get at least 150 minutes of moderate-intensity exercise (any activity that increases your heart rate and causes you to sweat) each week. In addition, most adults need muscle-strengthening exercises on 2 or more days a week.   Maintain a healthy weight. The body mass index (BMI) is a screening tool to identify possible weight problems. It provides an estimate of body fat based on height and weight. Your health care provider can find your BMI and can help you achieve or maintain a healthy weight. For males 20 years and older:  A BMI below 18.5 is considered underweight.  A BMI of 18.5 to 24.9 is normal.  A BMI of 25 to 29.9 is considered overweight.  A BMI of 30 and above is considered obese.  Maintain normal blood lipids and cholesterol by exercising and minimizing your intake of saturated fat. Eat a balanced diet with plenty of fruits and vegetables. Blood tests for lipids and cholesterol should begin at age 69 and be repeated every 5 years. If your lipid or cholesterol levels are high, you are over age 28, or you are at high risk for heart disease, you may need your cholesterol levels checked more  frequently.Ongoing high lipid and cholesterol levels should be treated with medicines if diet and exercise are not working.  If you smoke, find out from your health care provider how to quit. If you do not use tobacco, do not start.  Lung cancer screening is recommended for adults aged 25-80 years who are at high risk for developing lung cancer because of a history of smoking. A yearly low-dose CT scan of the lungs is recommended for people who have at least a 30-pack-year history of smoking and are current smokers or have quit within the past 15 years. A pack year of smoking is smoking an average of 1 pack of cigarettes a day for 1 year (for example, a 30-pack-year history of smoking could mean smoking 1 pack a day for 30 years or 2 packs a day for 15 years). Yearly screening should continue until the smoker has stopped smoking for at least 15 years. Yearly screening should be stopped for people who develop a health problem that would prevent them from having lung cancer treatment.  If you choose to drink alcohol, do not have more than 2 drinks per day. One drink is considered to be 12 oz (360 mL) of beer, 5 oz (150 mL) of wine, or 1.5 oz (45 mL) of liquor.  Avoid the use of street drugs. Do not share needles with anyone. Ask for help if you need support or instructions about stopping the use of drugs.  High blood pressure causes heart disease and increases the risk of  stroke. Blood pressure should be checked at least every 1-2 years. Ongoing high blood pressure should be treated with medicines if weight loss and exercise are not effective.  If you are 50-28 years old, ask your health care provider if you should take aspirin to prevent heart disease.  Diabetes screening involves taking a blood sample to check your fasting blood sugar level. This should be done once every 3 years after age 55 if you are at a normal weight and without risk factors for diabetes. Testing should be considered at a younger  age or be carried out more frequently if you are overweight and have at least 1 risk factor for diabetes.  Colorectal cancer can be detected and often prevented. Most routine colorectal cancer screening begins at the age of 61 and continues through age 34. However, your health care provider may recommend screening at an earlier age if you have risk factors for colon cancer. On a yearly basis, your health care provider may provide home test kits to check for hidden blood in the stool. A small camera at the end of a tube may be used to directly examine the colon (sigmoidoscopy or colonoscopy) to detect the earliest forms of colorectal cancer. Talk to your health care provider about this at age 72 when routine screening begins. A direct exam of the colon should be repeated every 5-10 years through age 7, unless early forms of precancerous polyps or small growths are found.  People who are at an increased risk for hepatitis B should be screened for this virus. You are considered at high risk for hepatitis B if:  You were born in a country where hepatitis B occurs often. Talk with your health care provider about which countries are considered high risk.  Your parents were born in a high-risk country and you have not received a shot to protect against hepatitis B (hepatitis B vaccine).  You have HIV or AIDS.  You use needles to inject street drugs.  You live with, or have sex with, someone who has hepatitis B.  You are a man who has sex with other men (MSM).  You get hemodialysis treatment.  You take certain medicines for conditions like cancer, organ transplantation, and autoimmune conditions.  Hepatitis C blood testing is recommended for all people born from [birth year] through 1966 and any individual with known risk factors for hepatitis C.  Healthy men should no longer receive prostate-specific antigen (PSA) blood tests as part of routine cancer screening. Talk to your health care provider about  prostate cancer screening.  Testicular cancer screening is not recommended for adolescents or adult males who have no symptoms. Screening includes self-exam, a health care provider exam, and other screening tests. Consult with your health care provider about any symptoms you have or any concerns you have about testicular cancer.  Practice safe sex. Use condoms and avoid high-risk sexual practices to reduce the spread of sexually transmitted infections (STIs).  You should be screened for STIs, including gonorrhea and chlamydia if:  You are sexually active and are younger than 24 years.  You are older than 24 years, and your health care provider tells you that you are at risk for this type of infection.  Your sexual activity has changed since you were last screened, and you are at an increased risk for chlamydia or gonorrhea. Ask your health care provider if you are at risk.  If you are at risk of being infected with HIV, it is recommended that  you take a prescription medicine daily to prevent HIV infection. This is called pre-exposure prophylaxis (PrEP). You are considered at risk if:  You are a man who has sex with other men (MSM).  You are a heterosexual man who is sexually active with multiple partners.  You take drugs by injection.  You are sexually active with a partner who has HIV.  Talk with your health care provider about whether you are at high risk of being infected with HIV. If you choose to begin PrEP, you should first be tested for HIV. You should then be tested every 3 months for as long as you are taking PrEP.  Use sunscreen. Apply sunscreen liberally and repeatedly throughout the day. You should seek shade when your shadow is shorter than you. Protect yourself by wearing long sleeves, pants, a wide-brimmed hat, and sunglasses year round whenever you are outdoors.  Tell your health care provider of new moles or changes in moles, especially if there is a change in shape or  color. Also, tell your health care provider if a mole is larger than the size of a pencil eraser.  A one-time screening for abdominal aortic aneurysm (AAA) and surgical repair of large AAAs by ultrasound is recommended for men aged 38-75 years who are current or former smokers.  Stay current with your vaccines (immunizations). Document Released: 04/11/2008 Document Revised: 10/19/2013 Document Reviewed: 03/11/2011 [facility] Patient Information 2015 [place], [place]. This information is not intended to replace advice given to you by your health care provider. Make sure you discuss any questions you have with your health care provider.

## 2015-07-13 NOTE — Assessment & Plan Note (Signed)
Chronic, stable. Continue current regimen. 

## 2015-07-14 ENCOUNTER — Encounter: Payer: Self-pay | Admitting: Family Medicine

## 2015-07-14 ENCOUNTER — Other Ambulatory Visit: Payer: Self-pay | Admitting: Family Medicine

## 2015-07-14 ENCOUNTER — Encounter: Payer: Self-pay | Admitting: *Deleted

## 2015-07-14 LAB — HEPATITIS C ANTIBODY: HCV Ab: NEGATIVE

## 2015-07-14 MED ORDER — VITAMIN B-12 1000 MCG PO TABS: 1000.0000 ug | ORAL_TABLET | Freq: Every day | ORAL | Status: DC

## 2015-08-24 ENCOUNTER — Ambulatory Visit (INDEPENDENT_AMBULATORY_CARE_PROVIDER_SITE_OTHER): Payer: Commercial Managed Care - HMO | Admitting: Primary Care

## 2015-08-24 ENCOUNTER — Encounter: Payer: Self-pay | Admitting: Primary Care

## 2015-08-24 VITALS — BP 130/72 | HR 81 | Temp 98.0°F | Ht 69.0 in | Wt 246.4 lb

## 2015-08-24 DIAGNOSIS — R197 Diarrhea, unspecified: Secondary | ICD-10-CM

## 2015-08-24 MED ORDER — DIPHENOXYLATE-ATROPINE 2.5-0.025 MG PO TABS: 1.0000 | ORAL_TABLET | Freq: Four times a day (QID) | ORAL | Status: DC | PRN

## 2015-08-24 NOTE — Patient Instructions (Signed)
Your diarrhea is likely related to a virus.   Complete lab work prior to leaving today. I will notify you of your results.  Start Lomotil for diarrhea. You may take 1-2 tablets by mouth four times daily.  Please call me if no improvement in on Monday next week.  Increase consumption of fluids, rest. Follow the diet recommendations below.  It was a pleasure meeting you!   Food Choices to Help Relieve Diarrhea, Adult When you have diarrhea, the foods you eat and your eating habits are very important. Choosing the right foods and drinks can help relieve diarrhea. Also, because diarrhea can last up to 7 days, you need to replace lost fluids and electrolytes (such as sodium, potassium, and chloride) in order to help prevent dehydration.  WHAT GENERAL GUIDELINES DO I NEED TO FOLLOW?  Slowly drink 1 cup (8 oz) of fluid for each episode of diarrhea. If you are getting enough fluid, your urine will be clear or pale yellow.  Eat starchy foods. Some good choices include white rice, white toast, pasta, low-fiber cereal, baked potatoes (without the skin), saltine crackers, and bagels.  Avoid large servings of any cooked vegetables.  Limit fruit to two servings per day. A serving is  cup or 1 small piece.  Choose foods with less than 2 g of fiber per serving.  Limit fats to less than 8 tsp (38 g) per day.  Avoid fried foods.  Eat foods that have probiotics in them. Probiotics can be found in certain dairy products.  Avoid foods and beverages that may increase the speed at which food moves through the stomach and intestines (gastrointestinal tract). Things to avoid include:  High-fiber foods, such as dried fruit, raw fruits and vegetables, nuts, seeds, and whole grain foods.  Spicy foods and high-fat foods.  Foods and beverages sweetened with high-fructose corn syrup, honey, or sugar alcohols such as xylitol, sorbitol, and mannitol. WHAT FOODS ARE RECOMMENDED? Grains White rice. White,  [place], or pita breads (fresh or toasted), including plain rolls, buns, or bagels. White pasta. Saltine, soda, or graham crackers. Pretzels. Low-fiber cereal. Cooked cereals made with water (such as cornmeal, farina, or cream cereals). Plain muffins. Matzo. Melba toast. Zwieback.  Vegetables Potatoes (without the skin). Strained tomato and vegetable juices. Most well-cooked and canned vegetables without seeds. Tender lettuce. Fruits Cooked or canned applesauce, apricots, cherries, fruit cocktail, grapefruit, peaches, pears, or plums. Fresh bananas, apples without skin, cherries, grapes, cantaloupe, grapefruit, peaches, oranges, or plums.  Meat and Other Protein Products Baked or boiled chicken. Eggs. Tofu. Fish. Seafood. Smooth peanut butter. Ground or well-cooked tender beef, ham, veal, lamb, pork, or poultry.  Dairy Plain yogurt, kefir, and unsweetened liquid yogurt. Lactose-free milk, buttermilk, or soy milk. Plain hard cheese. Beverages Sport drinks. Clear broths. Diluted fruit juices (except prune). Regular, caffeine-free sodas such as ginger ale. Water. Decaffeinated teas. Oral rehydration solutions. Sugar-free beverages not sweetened with sugar alcohols. Other Bouillon, broth, or soups made from recommended foods.  The items listed above may not be a complete list of recommended foods or beverages. Contact your dietitian for more options. WHAT FOODS ARE NOT RECOMMENDED? Grains Whole grain, whole wheat, bran, or rye breads, rolls, pastas, crackers, and cereals. Wild or brown rice. Cereals that contain more than 2 g of fiber per serving. Corn tortillas or taco shells. Cooked or dry oatmeal. Granola. Popcorn. Vegetables Raw vegetables. Cabbage, broccoli, Brussels sprouts, artichokes, baked beans, beet greens, corn, kale, legumes, peas, sweet potatoes, and yams. Potato skins.  Cooked spinach and cabbage. Fruits Dried fruit, including raisins and dates. Raw fruits. Stewed or dried prunes. Fresh  apples with skin, apricots, mangoes, pears, raspberries, and strawberries.  Meat and Other Protein Products Chunky peanut butter. Nuts and seeds. Beans and lentils. Berniece Salines.  Dairy High-fat cheeses. Milk, chocolate milk, and beverages made with milk, such as milk shakes. Cream. Ice cream. Sweets and Desserts Sweet rolls, doughnuts, and sweet breads. Pancakes and waffles. Fats and Oils Butter. Cream sauces. Margarine. Salad oils. Plain salad dressings. Olives. Avocados.  Beverages Caffeinated beverages (such as coffee, tea, soda, or energy drinks). Alcoholic beverages. Fruit juices with pulp. Prune juice. Soft drinks sweetened with high-fructose corn syrup or sugar alcohols. Other Coconut. Hot sauce. Chili powder. Mayonnaise. Gravy. Cream-based or milk-based soups.  The items listed above may not be a complete list of foods and beverages to avoid. Contact your dietitian for more information. WHAT SHOULD I DO IF I BECOME DEHYDRATED? Diarrhea can sometimes lead to dehydration. Signs of dehydration include dark urine and dry mouth and skin. If you think you are dehydrated, you should rehydrate with an oral rehydration solution. These solutions can be purchased at pharmacies, retail stores, or online.  Drink -1 cup (120-240 mL) of oral rehydration solution each time you have an episode of diarrhea. If drinking this amount makes your diarrhea worse, try drinking smaller amounts more often. For example, drink 1-3 tsp (5-15 mL) every 5-10 minutes.  A general rule for staying hydrated is to drink 1-2 L of fluid per day. Talk to your health care provider about the specific amount you should be drinking each day. Drink enough fluids to keep your urine clear or pale yellow.   This information is not intended to replace advice given to you by your health care provider. Make sure you discuss any questions you have with your health care provider.   Document Released: 01/04/2004 Document Revised: 11/04/2014  Document Reviewed: 09/06/2013 Elsevier Interactive Patient Education [employer].

## 2015-08-24 NOTE — Progress Notes (Signed)
Subjective:    Patient ID: Thomas Carlson, male    DOB: 09/18/1946, 69 y.o.   MRN: [id number]  HPI  Thomas Carlson is a 69 year old male who presents today with a chief complaint of diarrhea. His diarrhea has been constant for 1 week. Denies nausea, vomiting, bloody stools, fevers. He's tried taking Imodium OTC without relief despite following package instructions. He also reports abdominal cramping that is located to his bilateral lower quadrants.   Review of Systems  Constitutional: Negative for fever and chills.  Respiratory: Negative for shortness of breath.   Cardiovascular: Negative for chest pain.  Gastrointestinal: Positive for abdominal pain and diarrhea. Negative for nausea, vomiting and blood in stool.       Past Medical History  Diagnosis Date  . Diabetes mellitus ([place]) 2010  . HTN (hypertension)   . HLD (hyperlipidemia)   . Genital herpes   . CAD (coronary artery disease) 2005    s/p stent ([place])  . Gout   . Obesity   . DJD (degenerative joint disease)     knee  . Chronic kidney disease, stage 3, mod decreased GFR     Social History   Social History  . Marital Status: Married    Spouse Name: N/A  . Number of Children: N/A  . Years of Education: N/A   Occupational History  . Not on file.   Social History Main Topics  . Smoking status: Never Smoker   . Smokeless tobacco: Never Used  . Alcohol Use: No  . Drug Use: No  . Sexual Activity: Not on file   Other Topics Concern  . Not on file   Social History Narrative   Caffeine: 2 cans of diet soda/day   Lives with wife.  1 dog at home.  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 Appendectomy  1958  . Hernia repair  1956  . Subdural hematoma evacuation via craniotomy  1964    hit in helmet  by baseball  . Coronary angioplasty with stent placement  2005    stent 2005  . Closed reduction clavicle fracture  1955  . Knee arthroscopy  2008    torn meniscus  . Colonoscopy  02/2008    int hemorrhoids, diverticula (Dr. Allen Norris)    Family History  Problem Relation Age of Onset  . Diabetes Father 18  . Coronary artery disease Sister     catheterizations  . COPD Brother   . Stroke Brother   . Hypertension Brother   . Cancer Neg Hx     Allergies  Allergen Reactions  . Penicillins Hives  . Bactrim [Sulfamethoxazole-Trimethoprim] Rash    Diffuse drug reaction - maculopapular rash    Current Outpatient Prescriptions on File Prior to Visit  Medication Sig Dispense Refill  . acyclovir (ZOVIRAX) 400 MG tablet Take 1 tablet (400 mg total) by mouth 2 (two) times daily. 180 tablet 1  . aspirin 81 MG tablet Take 81 mg by mouth daily.    Marland Kitchen atenolol-chlorthalidone (TENORETIC) 50-25 MG per tablet Take 1 tablet by mouth daily. 90 tablet 3  . cholecalciferol (VITAMIN D) 1000 UNITS tablet Take 1,000 Units by mouth daily.    . indomethacin (INDOCIN) 50 MG capsule Take 50 mg by mouth daily as needed (  gout flare).    Marland Kitchen lovastatin (MEVACOR) 40 MG tablet Take 2 tablets (80 mg total) by mouth at bedtime. 180 tablet 3  . metFORMIN (GLUCOPHAGE-XR) 500 MG 24 hr tablet Take 1 tablet (500 mg total) by mouth 2 (two) times daily. 180 tablet 3  . Multiple Vitamin (MULTIVITAMIN) tablet Take 1 tablet by mouth daily.    . Omega-3 Fatty Acids (FISH OIL) 1360 MG CAPS Take 1 capsule by mouth 2 (two) times daily.    . quinapril (ACCUPRIL) 40 MG tablet Take 1 tablet (40 mg total) by mouth at bedtime. 90 tablet 3  . vitamin B-12 (CYANOCOBALAMIN) 1000 MCG tablet Take 1 tablet (1,000 mcg total) by mouth daily.     No current facility-administered medications on file prior to visit.    BP 130/72 mmHg  Pulse 81  Temp(Src) 98 F (36.7 C) (Oral)  Ht 5\' 9"  (1.753 m)  Wt 246 lb 6.4 oz (111.766 kg)  BMI 36.37 kg/m2   SpO2 98%    Objective:   Physical Exam  Constitutional: He appears well-nourished. He does not appear ill.  Cardiovascular: Normal rate and regular rhythm.   Pulmonary/Chest: Effort normal and breath sounds normal.  Abdominal: Soft. Bowel sounds are normal. There is tenderness in the suprapubic area and left lower quadrant.  Skin: Skin is warm and dry.          Assessment & Plan:  Diarrhea:  Constant x 1 week with LLQ and suprapubic abdominal cramping. No nausea, vomiting, bloody stools. Vital signs WNL, does not appear sickly. Suspect viral process, most likely not diverticulitis, no evidence of diverticulitis in chart. Exam with mild tenderness to LLQ and suprapubic region. CBC, CMP pending. RX for Lomotil provided. He is to call if no improvement Monday next week.

## 2015-08-24 NOTE — Progress Notes (Signed)
Pre visit review using our clinic review tool, if applicable. No additional management support is needed unless otherwise documented below in the visit note. 

## 2015-08-25 LAB — COMPREHENSIVE METABOLIC PANEL
ALK PHOS: 59 U/L (ref 39–117)
ALT: 13 U/L (ref 0–53)
AST: 17 U/L (ref 0–37)
Albumin: 4.2 g/dL (ref 3.5–5.2)
BILIRUBIN TOTAL: 0.3 mg/dL (ref 0.2–1.2)
BUN: 34 mg/dL — ABNORMAL HIGH (ref 6–23)
CALCIUM: 9.7 mg/dL (ref 8.4–10.5)
CO2: 24 meq/L (ref 19–32)
Chloride: 102 mEq/L (ref 96–112)
Creatinine, Ser: 1.75 mg/dL — ABNORMAL HIGH (ref 0.40–1.50)
GFR: 41.13 mL/min — AB (ref >60.00)
GLUCOSE: 106 mg/dL — AB (ref 70–99)
POTASSIUM: 4 meq/L (ref 3.5–5.1)
Sodium: 139 mEq/L (ref 135–145)
TOTAL PROTEIN: 7.2 g/dL (ref 6.0–8.3)

## 2015-08-25 LAB — CBC WITH DIFFERENTIAL/PLATELET
BASOS ABS: 0.1 10*3/uL (ref 0.0–0.1)
Basophils Relative: 0.6 % (ref 0.0–3.0)
EOS PCT: 2.8 % (ref 0.0–5.0)
Eosinophils Absolute: 0.3 10*3/uL (ref 0.0–0.7)
HEMATOCRIT: 42.6 % (ref 39.0–52.0)
Hemoglobin: 14.2 g/dL (ref 13.0–17.0)
LYMPHS PCT: 15.8 % (ref 12.0–46.0)
Lymphs Abs: 1.5 10*3/uL (ref 0.7–4.0)
MCHC: 33.3 g/dL (ref 30.0–36.0)
MCV: 96.1 fl (ref 78.0–100.0)
MONOS PCT: 9.3 % (ref 3.0–12.0)
Monocytes Absolute: 0.9 10*3/uL (ref 0.1–1.0)
Neutro Abs: 6.9 10*3/uL (ref 1.4–7.7)
Neutrophils Relative %: 71.5 % (ref 43.0–77.0)
Platelets: 230 10*3/uL (ref 150.0–400.0)
RBC: 4.44 Mil/uL (ref 4.22–5.81)
RDW: 14.4 % (ref 11.5–15.5)
WBC: 9.6 10*3/uL (ref 4.0–10.5)

## 2015-08-28 ENCOUNTER — Telehealth: Payer: Self-pay | Admitting: *Deleted

## 2015-08-28 DIAGNOSIS — R197 Diarrhea, unspecified: Secondary | ICD-10-CM

## 2015-08-28 MED ORDER — DIPHENOXYLATE-ATROPINE 2.5-0.025 MG PO TABS: 1.0000 | ORAL_TABLET | Freq: Four times a day (QID) | ORAL | Status: DC | PRN

## 2015-08-28 NOTE — Telephone Encounter (Signed)
Called and notified patient of Kate's comments. Patient verbalized understanding.  

## 2015-08-28 NOTE — Telephone Encounter (Signed)
Called in Rx to [facility]

## 2015-08-28 NOTE — Telephone Encounter (Signed)
-----   Message from Pleas Koch, NP sent at 08/28/2015 12:18 PM EDT ----- Sounds like he's improving overall, which is great. I'm happy to send more Lomotil to his pharmacy. Please have him continue to stay hydrated with water and to advance his diet slowly with the suggestions I provided to him last week. He is to notify me if his symptoms become worse, develops fevers, has a return of abdominal cramping. Will you please call in a refill of his Lomotil with the same instructions? Thanks.

## 2015-09-01 ENCOUNTER — Ambulatory Visit: Payer: Commercial Managed Care - HMO | Admitting: Primary Care

## 2015-09-04 ENCOUNTER — Ambulatory Visit (INDEPENDENT_AMBULATORY_CARE_PROVIDER_SITE_OTHER): Payer: Commercial Managed Care - HMO | Admitting: Family Medicine

## 2015-09-04 ENCOUNTER — Encounter: Payer: Self-pay | Admitting: Family Medicine

## 2015-09-04 VITALS — BP 116/74 | HR 72 | Temp 98.3°F | Wt 247.0 lb

## 2015-09-04 DIAGNOSIS — A09 Infectious gastroenteritis and colitis, unspecified: Secondary | ICD-10-CM | POA: Diagnosis not present

## 2015-09-04 DIAGNOSIS — R197 Diarrhea, unspecified: Secondary | ICD-10-CM

## 2015-09-04 DIAGNOSIS — N289 Disorder of kidney and ureter, unspecified: Secondary | ICD-10-CM

## 2015-09-04 MED ORDER — CIPROFLOXACIN HCL 250 MG PO TABS: ORAL_TABLET | ORAL | Status: DC

## 2015-09-04 NOTE — Addendum Note (Signed)
Addended by: Ellamae Sia on: 09/04/2015 12:01 PM   Modules accepted: Orders

## 2015-09-04 NOTE — Progress Notes (Signed)
Pre visit review using our clinic review tool, if applicable. No additional management support is needed unless otherwise documented below in the visit note. 

## 2015-09-04 NOTE — Assessment & Plan Note (Signed)
This is third week of persistent diarrhea, now cramping /abd pain has resolved. Doubt diverticulitis. Check C diff and stool cultures, and treat with empiric 3d cipro course. Advised to cut quianpril in half until diarrhea resolves. Update if sxs persist or fail to improve. Pt agrees with plan.

## 2015-09-04 NOTE — Progress Notes (Signed)
BP 116/74 mmHg  Pulse 72  Temp(Src) 98.3 F (36.8 C) (Oral)  Wt 247 lb (112.038 kg)   CC: diarrhea   Subjective:    Patient ID: Thomas Carlson, male    DOB: 07-Dec-1945, 69 y.o.   MRN: [id number]  HPI: Thomas Carlson is a 69 y.o. male presenting on 09/04/2015 for Follow-up   Reviewed Kate's note. Seen here 08/24/2015 with 1wk h/o diarrhea - thought viral gastroenteritis. Initially self treated with immodium. CBC, LFTs WNL. Cr bumped to 1.75. Treated with lomotil.   Abd pain/cramping improved but diarrhea has persisted. 4-5x/day. Now looser stool prior watery. No blood or mucous in stool. No urinary changes.  No new foods, no recent travel. No sick contacts.  Wife going through breast cancer treatments.  Uses city water. No recent abx use.   Endorses staying well hydrated.   Relevant past medical, surgical, family and social history reviewed and updated as indicated. Interim medical history since our last visit reviewed. Allergies and medications reviewed and updated. Current Outpatient Prescriptions on File Prior to Visit  Medication Sig  . acyclovir (ZOVIRAX) 400 MG tablet Take 1 tablet (400 mg total) by mouth 2 (two) times daily.  Marland Kitchen aspirin 81 MG tablet Take 81 mg by mouth daily.  Marland Kitchen atenolol-chlorthalidone (TENORETIC) 50-25 MG per tablet Take 1 tablet by mouth daily.  . cholecalciferol (VITAMIN D) 1000 UNITS tablet Take 1,000 Units by mouth daily.  . diphenoxylate-atropine (LOMOTIL) 2.5-0.025 MG tablet Take 1-2 tablets by mouth 4 (four) times daily as needed for diarrhea or loose stools.  . indomethacin (INDOCIN) 50 MG capsule Take 50 mg by mouth daily as needed (gout flare).  Marland Kitchen lovastatin (MEVACOR) 40 MG tablet Take 2 tablets (80 mg total) by mouth at bedtime.  . metFORMIN (GLUCOPHAGE-XR) 500 MG 24 hr tablet Take 1 tablet (500 mg total) by mouth 2 (two) times daily.  . Multiple Vitamin (MULTIVITAMIN) tablet Take 1 tablet by mouth daily.  . Omega-3 Fatty Acids (FISH OIL)  1360 MG CAPS Take 1 capsule by mouth 2 (two) times daily.  . quinapril (ACCUPRIL) 40 MG tablet Take 1 tablet (40 mg total) by mouth at bedtime.  . vitamin B-12 (CYANOCOBALAMIN) 1000 MCG tablet Take 1 tablet (1,000 mcg total) by mouth daily.   No current facility-administered medications on file prior to visit.    Review of Systems Per HPI unless specifically indicated in ROS section     Objective:    BP 116/74 mmHg  Pulse 72  Temp(Src) 98.3 F (36.8 C) (Oral)  Wt 247 lb (112.038 kg)  Wt Readings from Last 3 Encounters:  09/04/15 247 lb (112.038 kg)  08/24/15 246 lb 6.4 oz (111.766 kg)  07/13/15 244 lb (110.678 kg)    Physical Exam  Constitutional: He appears well-developed and well-nourished. No distress.  HENT:  Mouth/Throat: Oropharynx is clear and moist. No oropharyngeal exudate.  Cardiovascular: Normal rate, regular rhythm, normal heart sounds and intact distal pulses.   No murmur heard. Pulmonary/Chest: Effort normal and breath sounds normal. No respiratory distress. He has no wheezes. He has no rales.  Abdominal: Soft. Normal appearance and bowel sounds are normal. He exhibits no distension and no mass. There is no hepatosplenomegaly. There is no tenderness. There is no rigidity, no rebound, no guarding, no CVA tenderness and negative Murphy's sign.  Musculoskeletal: He exhibits no edema.  Skin: Skin is warm and dry. No rash noted. No pallor.  Psychiatric: He has a normal mood and affect.  Nursing note and vitals reviewed.  Results for orders placed or performed in visit on 08/24/15  Comprehensive metabolic panel  Result Value Ref Range   Sodium 139 135 - 145 mEq/L   Potassium 4.0 3.5 - 5.1 mEq/L   Chloride 102 96 - 112 mEq/L   CO2 24 19 - 32 mEq/L   Glucose, Bld 106 (H) 70 - 99 mg/dL   BUN 34 (H) 6 - 23 mg/dL   Creatinine, Ser 1.75 (H) 0.40 - 1.50 mg/dL   Total Bilirubin 0.3 0.2 - 1.2 mg/dL   Alkaline Phosphatase 59 39 - 117 U/L   AST 17 0 - 37 U/L   ALT 13 0 -  53 U/L   Total Protein 7.2 6.0 - 8.3 g/dL   Albumin 4.2 3.5 - 5.2 g/dL   Calcium 9.7 8.4 - 10.5 mg/dL   GFR 41.13 (L) >60.00 mL/min  CBC with Differential/Platelet  Result Value Ref Range   WBC 9.6 4.0 - 10.5 K/uL   RBC 4.44 4.22 - 5.81 Mil/uL   Hemoglobin 14.2 13.0 - 17.0 g/dL   HCT 42.6 39.0 - 52.0 %   MCV 96.1 78.0 - 100.0 fl   MCHC 33.3 30.0 - 36.0 g/dL   RDW 14.4 11.5 - 15.5 %   Platelets 230.0 150.0 - 400.0 K/uL   Neutrophils Relative % 71.5 43.0 - 77.0 %   Lymphocytes Relative 15.8 12.0 - 46.0 %   Monocytes Relative 9.3 3.0 - 12.0 %   Eosinophils Relative 2.8 0.0 - 5.0 %   Basophils Relative 0.6 0.0 - 3.0 %   Neutro Abs 6.9 1.4 - 7.7 K/uL   Lymphs Abs 1.5 0.7 - 4.0 K/uL   Monocytes Absolute 0.9 0.1 - 1.0 K/uL   Eosinophils Absolute 0.3 0.0 - 0.7 K/uL   Basophils Absolute 0.1 0.0 - 0.1 K/uL      Assessment & Plan:   Problem List Items Addressed This Visit    Diarrhea of presumed infectious origin - Primary    This is third week of persistent diarrhea, now cramping /abd pain has resolved. Doubt diverticulitis. Check C diff and stool cultures, and treat with empiric 3d cipro course. Advised to cut quianpril in half until diarrhea resolves. Update if sxs persist or fail to improve. Pt agrees with plan.      Relevant Orders   C. difficile GDH and Toxin A/B   Stool culture    Other Visit Diagnoses    Acute renal insufficiency            Follow up plan: No Follow-up on file.

## 2015-09-04 NOTE — Patient Instructions (Addendum)
Cut quinapril in half until diarrhea resolves.  Stool studies then start cipro course for 3 days.  Let us know if not improving after treatment. Push fluids like water, ginger ale, gatorade (G2).  Diarrhea Diarrhea is frequent loose and watery bowel movements. It can cause you to feel weak and dehydrated. Dehydration can cause you to become tired and thirsty, have a dry mouth, and have decreased urination that often is dark yellow. Diarrhea is a sign of another problem, most often an infection that will not last long. In most cases, diarrhea typically lasts 2-3 days. However, it can last longer if it is a sign of something more serious. It is important to treat your diarrhea as directed by your caregiver to lessen or prevent future episodes of diarrhea. CAUSES  Some common causes include:  Gastrointestinal infections caused by viruses, bacteria, or parasites.  Food poisoning or food allergies.  Certain medicines, such as antibiotics, chemotherapy, and laxatives.  Artificial sweeteners and fructose.  Digestive disorders. HOME CARE INSTRUCTIONS  Ensure adequate fluid intake (hydration): Have 1 cup (8 oz) of fluid for each diarrhea episode. Avoid fluids that contain simple sugars or sports drinks, fruit juices, whole milk products, and sodas. Your urine should be clear or pale yellow if you are drinking enough fluids. Hydrate with an oral rehydration solution that you can purchase at pharmacies, retail stores, and online. You can prepare an oral rehydration solution at home by mixing the following ingredients together:   - tsp table salt.   tsp baking soda.   tsp salt substitute containing potassium chloride.  1  tablespoons sugar.  1 L (34 oz) of water.  Certain foods and beverages may increase the speed at which food moves through the gastrointestinal (GI) tract. These foods and beverages should be avoided and include:  Caffeinated and alcoholic beverages.  High-fiber foods, such  as raw fruits and vegetables, nuts, seeds, and whole grain breads and cereals.  Foods and beverages sweetened with sugar alcohols, such as xylitol, sorbitol, and mannitol.  Some foods may be well tolerated and may help thicken stool including:  Starchy foods, such as rice, toast, pasta, low-sugar cereal, oatmeal, grits, baked potatoes, crackers, and bagels.  Bananas.  Applesauce.  Add probiotic-rich foods to help increase healthy bacteria in the GI tract, such as yogurt and fermented milk products.  Wash your hands well after each diarrhea episode.  Only take over-the-counter or prescription medicines as directed by your caregiver.  Take a warm bath to relieve any burning or pain from frequent diarrhea episodes. SEEK IMMEDIATE MEDICAL CARE IF:   You are unable to keep fluids down.  You have persistent vomiting.  You have blood in your stool, or your stools are black and tarry.  You do not urinate in 6-8 hours, or there is only a small amount of very dark urine.  You have abdominal pain that increases or localizes.  You have weakness, dizziness, confusion, or light-headedness.  You have a severe headache.  Your diarrhea gets worse or does not get better.  You have a fever or persistent symptoms for more than 2-3 days.  You have a fever and your symptoms suddenly get worse. MAKE SURE YOU:   Understand these instructions.  Will watch your condition.  Will get help right away if you are not doing well or get worse.   This information is not intended to replace advice given to you by your health care provider. Make sure you discuss any questions you have  with your health care provider.   Document Released: 10/04/2002 Document Revised: 11/04/2014 Document Reviewed: 06/21/2012 Elsevier Interactive Patient Education [employer].

## 2015-09-05 DIAGNOSIS — A09 Infectious gastroenteritis and colitis, unspecified: Secondary | ICD-10-CM | POA: Diagnosis not present

## 2015-09-05 NOTE — Addendum Note (Signed)
Addended by: Marchia Bond on: 09/05/2015 08:50 AM   Modules accepted: Orders

## 2015-09-06 LAB — C. DIFFICILE GDH AND TOXIN A/B
C. DIFF TOXIN A/B: NOT DETECTED
C. DIFFICILE GDH: NOT DETECTED

## 2015-09-09 LAB — STOOL CULTURE

## 2015-09-13 ENCOUNTER — Emergency Department
Admission: EM | Admit: 2015-09-13 | Discharge: 2015-09-13 | Disposition: A | Discharge status: Home / Self Care | Payer: Commercial Managed Care - HMO | Attending: Emergency Medicine | Admitting: Emergency Medicine

## 2015-09-13 ENCOUNTER — Emergency Department: Payer: Commercial Managed Care - HMO

## 2015-09-13 DIAGNOSIS — Z880 Allergy status to penicillin: Secondary | ICD-10-CM | POA: Insufficient documentation

## 2015-09-13 DIAGNOSIS — R109 Unspecified abdominal pain: Secondary | ICD-10-CM | POA: Insufficient documentation

## 2015-09-13 DIAGNOSIS — N183 Chronic kidney disease, stage 3 (moderate): Secondary | ICD-10-CM | POA: Insufficient documentation

## 2015-09-13 DIAGNOSIS — Z7982 Long term (current) use of aspirin: Secondary | ICD-10-CM | POA: Insufficient documentation

## 2015-09-13 DIAGNOSIS — M545 Low back pain, unspecified: Secondary | ICD-10-CM

## 2015-09-13 DIAGNOSIS — I129 Hypertensive chronic kidney disease with stage 1 through stage 4 chronic kidney disease, or unspecified chronic kidney disease: Secondary | ICD-10-CM | POA: Insufficient documentation

## 2015-09-13 DIAGNOSIS — Z79899 Other long term (current) drug therapy: Secondary | ICD-10-CM | POA: Diagnosis not present

## 2015-09-13 DIAGNOSIS — K8020 Calculus of gallbladder without cholecystitis without obstruction: Secondary | ICD-10-CM | POA: Diagnosis not present

## 2015-09-13 DIAGNOSIS — Z792 Long term (current) use of antibiotics: Secondary | ICD-10-CM | POA: Diagnosis not present

## 2015-09-13 DIAGNOSIS — E119 Type 2 diabetes mellitus without complications: Secondary | ICD-10-CM | POA: Insufficient documentation

## 2015-09-13 LAB — CBC
HEMATOCRIT: 39.5 % — AB (ref 40.0–52.0)
HEMOGLOBIN: 13.2 g/dL (ref 13.0–18.0)
MCH: 31.4 pg (ref 26.0–34.0)
MCHC: 33.5 g/dL (ref 32.0–36.0)
MCV: 93.9 fL (ref 80.0–100.0)
Platelets: 230 10*3/uL (ref 150–440)
RBC: 4.21 MIL/uL — AB (ref 4.40–5.90)
RDW: 14 % (ref 11.5–14.5)
WBC: 10.8 10*3/uL — ABNORMAL HIGH (ref 3.8–10.6)

## 2015-09-13 LAB — COMPREHENSIVE METABOLIC PANEL
ALK PHOS: 59 U/L (ref 38–126)
ALT: 20 U/L (ref 17–63)
ANION GAP: 8 (ref 5–15)
AST: 23 U/L (ref 15–41)
Albumin: 3.7 g/dL (ref 3.5–5.0)
BILIRUBIN TOTAL: 0.8 mg/dL (ref 0.3–1.2)
BUN: 28 mg/dL — ABNORMAL HIGH (ref 6–20)
CALCIUM: 8.9 mg/dL (ref 8.9–10.3)
CO2: 25 mmol/L (ref 22–32)
CREATININE: 1.46 mg/dL — AB (ref 0.61–1.24)
Chloride: 106 mmol/L (ref 101–111)
GFR calc non Af Amer: 47 mL/min — ABNORMAL LOW (ref >60)
GFR, EST AFRICAN AMERICAN: 54 mL/min — AB (ref >60)
GLUCOSE: 147 mg/dL — AB (ref 65–99)
Potassium: 3.7 mmol/L (ref 3.5–5.1)
Sodium: 139 mmol/L (ref 135–145)
TOTAL PROTEIN: 6.8 g/dL (ref 6.5–8.1)

## 2015-09-13 LAB — URINALYSIS COMPLETE WITH MICROSCOPIC (ARMC ONLY)
BACTERIA UA: NONE SEEN
Bilirubin Urine: NEGATIVE
GLUCOSE, UA: NEGATIVE mg/dL
Hgb urine dipstick: NEGATIVE
Ketones, ur: NEGATIVE mg/dL
LEUKOCYTES UA: NEGATIVE
Nitrite: NEGATIVE
PROTEIN: NEGATIVE mg/dL
SPECIFIC GRAVITY, URINE: 1.02 (ref 1.005–1.030)
pH: 5 (ref 5.0–8.0)

## 2015-09-13 MED ORDER — HYDROMORPHONE HCL 2 MG PO TABS: 2.0000 mg | ORAL_TABLET | Freq: Two times a day (BID) | ORAL | Status: DC | PRN

## 2015-09-13 MED ORDER — ONDANSETRON HCL 4 MG/2ML IJ SOLN
4.0000 mg | Freq: Once | INTRAMUSCULAR | Status: AC
  Administered 2015-09-13: 4 mg via INTRAVENOUS
  Filled 2015-09-13: qty 2

## 2015-09-13 MED ORDER — MORPHINE SULFATE (PF) 4 MG/ML IV SOLN
4.0000 mg | Freq: Once | INTRAVENOUS | Status: AC
  Administered 2015-09-13: 4 mg via INTRAVENOUS
  Filled 2015-09-13: qty 1

## 2015-09-13 MED ORDER — HYDROMORPHONE HCL 1 MG/ML IJ SOLN
1.0000 mg | Freq: Once | INTRAMUSCULAR | Status: AC
  Administered 2015-09-13: 1 mg via INTRAVENOUS
  Filled 2015-09-13: qty 1

## 2015-09-13 MED ORDER — METHYLPREDNISOLONE 4 MG PO TBPK: ORAL_TABLET | ORAL | Status: DC

## 2015-09-13 MED ORDER — DEXAMETHASONE SODIUM PHOSPHATE 10 MG/ML IJ SOLN
10.0000 mg | Freq: Once | INTRAMUSCULAR | Status: AC
  Administered 2015-09-13: 10 mg via INTRAVENOUS
  Filled 2015-09-13: qty 1

## 2015-09-13 MED ORDER — KETOROLAC TROMETHAMINE 30 MG/ML IJ SOLN
15.0000 mg | Freq: Once | INTRAMUSCULAR | Status: AC
  Administered 2015-09-13: 15 mg via INTRAVENOUS
  Filled 2015-09-13 (×2): qty 1

## 2015-09-13 MED ORDER — HYDROMORPHONE HCL 1 MG/ML IJ SOLN
1.0000 mg | Freq: Once | INTRAMUSCULAR | Status: DC
Start: 2015-09-13 — End: 2015-09-13

## 2015-09-13 NOTE — ED Provider Notes (Signed)
IMPRESSION: 6 mm anterolisthesis L5-S1 relates to both L5 pars defects and facet arthropathy. Correlate clinically for BILATERAL L5 radicular symptoms.  Shallow central protrusion L4-5 in association with BILATERAL facet arthropathy. LEFT subarticular zone narrowing could affect the LEFT L5 nerve root.  Broad-based central protrusion L3-4, noncompressive.  Patient will be discharged with pain medication and steroid taper. He'll be referred to his primary care doctor for follow-up.  Earleen Newport, MD 09/13/15 [phone]

## 2015-09-13 NOTE — Discharge Instructions (Signed)
Degenerative Disk Disease  Degenerative disk disease is a condition caused by the changes that occur in spinal disks as you grow older. Spinal disks are soft and compressible disks located between the bones of your spine (vertebrae). These disks act like shock absorbers. Degenerative disk disease can affect the whole spine. However, the neck and lower back are most commonly affected. Many changes can occur in the spinal disks with aging, such as:  · The spinal disks may dry and shrink.  · Small tears may occur in the tough, outer covering of the disk (annulus).  · The disk space may become smaller due to loss of water.  · Abnormal growths in the bone (spurs) may occur. This can put pressure on the nerve roots exiting the spinal canal, causing pain.  · The spinal canal may become narrowed.  RISK FACTORS   · Being overweight.  · Having a family history of degenerative disk disease.  · Smoking.  · There is increased risk if you are doing heavy lifting or have a sudden injury.  SIGNS AND SYMPTOMS   Symptoms vary from person to person and may include:  · Pain that varies in intensity. Some people have no pain, while others have severe pain. The location of the pain depends on the part of your backbone that is affected.  ¨ You will have neck or arm pain if a disk in the neck area is affected.  ¨ You will have pain in your back, buttocks, or legs if a disk in the lower back is affected.  · Pain that becomes worse while bending, reaching up, or with twisting movements.  · Pain that may start gradually and then get worse as time passes. It may also start after a major or minor injury.  · Numbness or tingling in the arms or legs.  DIAGNOSIS   Your health care provider will ask you about your symptoms and about activities or habits that may cause the pain. He or she may also ask about any injuries, diseases, or treatments you have had. Your health care provider will examine you to check for the range of movement that is  possible in the affected area, to check for strength in your extremities, and to check for sensation in the areas of the arms and legs supplied by different nerve roots. You may also have:   · An X-ray of the spine.  · Other imaging tests, such as MRI.  TREATMENT   Your health care provider will advise you on the best plan for treatment. Treatment may include:  · Medicines.  · Rehabilitation exercises.  HOME CARE INSTRUCTIONS   · Follow proper lifting and walking techniques as advised by your health care provider.  · Maintain good posture.  · Exercise regularly as advised by your health care provider.  · Perform relaxation exercises.  · Change your sitting, standing, and sleeping habits as advised by your health care provider.  · Change positions frequently.  · Lose weight or maintain a healthy weight as advised by your health care provider.  · Do not use any tobacco products, including cigarettes, chewing tobacco, or electronic cigarettes. If you need help quitting, ask your health care provider.  · Wear supportive footwear.  · Take medicines only as directed by your health care provider.  SEEK MEDICAL CARE IF:   · Your pain does not go away within 1-4 weeks.  · You have significant appetite or weight loss.  SEEK IMMEDIATE MEDICAL CARE IF:   ·   Your pain is severe.  · You notice weakness in your arms, hands, or legs.  · You begin to lose control of your bladder or bowel movements.  · You have fevers or night sweats.  MAKE SURE YOU:   · Understand these instructions.  · Will watch your condition.  · Will get help right away if you are not doing well or get worse.     This information is not intended to replace advice given to you by your health care provider. Make sure you discuss any questions you have with your health care provider.     Document Released: 08/11/2007 Document Revised: 11/04/2014 Document Reviewed: 02/15/2014  Elsevier Interactive Patient Education ©2016 [employer]

## 2015-09-13 NOTE — ED Notes (Signed)
EDP at bedside at this time.  

## 2015-09-13 NOTE — ED Notes (Signed)
Report given, Park Pope, RN.

## 2015-09-13 NOTE — ED Provider Notes (Signed)
[facility] Emergency Department Provider Note  ____________________________________________  Time seen: 5:30 AM  I have reviewed the triage vital signs and the nursing notes.   HISTORY  Chief Complaint Back Pain      HPI Thomas Carlson is a 69 y.o. male presents with progressive left lower back pain "for a few days with acute worsening tonight. Patient states current pain score is 10 out of 10 and nonradiating. No pain noted in the lower extremities no weakness or numbness. Patient denies any dysuria no hematuria frequency or urgency. Patient denies any history of kidney stones in the past.     Past Medical History  Diagnosis Date  . Diabetes mellitus ([place]) 2010  . HTN (hypertension)   . HLD (hyperlipidemia)   . Genital herpes   . CAD (coronary artery disease) 2005    s/p stent ([place])  . Gout   . Obesity   . DJD (degenerative joint disease)     knee  . Chronic kidney disease, stage 3, mod decreased GFR     Patient Active Problem List   Diagnosis Date Noted  . Diarrhea of presumed infectious origin 09/04/2015  . Medicare annual wellness visit, initial 07/13/2015  . Advanced care planning/counseling discussion 07/13/2015  . Right foot pain 07/13/2015  . Left buttock pain 05/26/2014  . Vitamin D deficiency 07/14/2013  . Healthcare maintenance 07/10/2012  . Chronic kidney disease, stage 3, mod decreased GFR   . DJD (degenerative joint disease)   . Diabetes mellitus type 2, controlled, with complications ([place])   . HTN (hypertension)   . HLD (hyperlipidemia)   . CAD (coronary artery disease)   . Gout   . Obesity, Class II, BMI 35-39.9, with comorbidity [facility])     Past Surgical History  Procedure Laterality Date  . Appendectomy  1958  . Hernia repair  1956  . Subdural hematoma evacuation via craniotomy  1964    hit in helmet by baseball  . Coronary angioplasty with stent placement  2005    stent 2005  . Closed reduction clavicle  fracture  1955  . Knee arthroscopy  2008    torn meniscus  . Colonoscopy  02/2008    int hemorrhoids, diverticula (Dr. Allen Norris)    Current Outpatient Rx  Name  Route  Sig  Dispense  Refill  . acyclovir (ZOVIRAX) 400 MG tablet   Oral   Take 1 tablet (400 mg total) by mouth 2 (two) times daily.   180 tablet   1   . aspirin 81 MG tablet   Oral   Take 81 mg by mouth daily.         Marland Kitchen atenolol-chlorthalidone (TENORETIC) 50-25 MG per tablet   Oral   Take 1 tablet by mouth daily.   90 tablet   3   . cholecalciferol (VITAMIN D) 1000 UNITS tablet   Oral   Take 1,000 Units by mouth daily.         . ciprofloxacin (CIPRO) 250 MG tablet      Take two tablets twice daily for 1 day then one tablet twice daily for 2 days   8 tablet   0   . diphenoxylate-atropine (LOMOTIL) 2.5-0.025 MG tablet   Oral   Take 1-2 tablets by mouth 4 (four) times daily as needed for diarrhea or loose stools.   30 tablet   0   . indomethacin (INDOCIN) 50 MG capsule   Oral   Take 50 mg by mouth daily as  needed (gout flare).         Marland Kitchen lovastatin (MEVACOR) 40 MG tablet   Oral   Take 2 tablets (80 mg total) by mouth at bedtime.   180 tablet   3   . metFORMIN (GLUCOPHAGE-XR) 500 MG 24 hr tablet   Oral   Take 1 tablet (500 mg total) by mouth 2 (two) times daily.   180 tablet   3   . Multiple Vitamin (MULTIVITAMIN) tablet   Oral   Take 1 tablet by mouth daily.         . Omega-3 Fatty Acids (FISH OIL) 1360 MG CAPS   Oral   Take 1 capsule by mouth 2 (two) times daily.         . quinapril (ACCUPRIL) 40 MG tablet   Oral   Take 1 tablet (40 mg total) by mouth at bedtime.   90 tablet   3   . vitamin B-12 (CYANOCOBALAMIN) 1000 MCG tablet   Oral   Take 1 tablet (1,000 mcg total) by mouth daily.           Allergies Penicillins and Bactrim  Family History  Problem Relation Age of Onset  . Diabetes Father 38  . Coronary artery disease Sister     catheterizations  . COPD Brother   .  Stroke Brother   . Hypertension Brother   . Cancer Neg Hx     Social History Social History  Substance Use Topics  . Smoking status: Never Smoker   . Smokeless tobacco: Never Used  . Alcohol Use: No    Review of Systems  Constitutional: Negative for fever. Eyes: Negative for visual changes. ENT: Negative for sore throat. Cardiovascular: Negative for chest pain. Respiratory: Negative for shortness of breath. Gastrointestinal: Negative for abdominal pain, vomiting and diarrhea. Genitourinary: Negative for dysuria. Musculoskeletal: Positive for back pain. Skin: Negative for rash. Neurological: Negative for headaches, focal weakness or numbness.   10-point ROS otherwise negative.  ____________________________________________   PHYSICAL EXAM:  VITAL SIGNS: ED Triage Vitals  Enc Vitals Group     BP 09/13/15 0451 112/72 mmHg     Pulse Rate 09/13/15 0451 73     Resp 09/13/15 0451 20     Temp 09/13/15 0451 97.9 F (36.6 C)     Temp Source 09/13/15 0451 Oral     SpO2 09/13/15 0451 95 %     Weight 09/13/15 0451 247 lb (112.038 kg)     Height 09/13/15 0451 5\' 9"  (1.753 m)     Head Cir --      Peak Flow --      Pain Score 09/13/15 0451 9     Pain Loc --      Pain Edu? --      Excl. in [place]? --      Constitutional: Alert and oriented. Apparent discomfort Eyes: Conjunctivae are normal. PERRL. Normal extraocular movements. ENT   Head: Normocephalic and atraumatic.   Nose: No congestion/rhinnorhea.   Mouth/Throat: Mucous membranes are moist.   Neck: No stridor. Hematological/Lymphatic/Immunilogical: No cervical lymphadenopathy. Cardiovascular: Normal rate, regular rhythm. Normal and symmetric distal pulses are present in all extremities. No murmurs, rubs, or gallops. Respiratory: Normal respiratory effort without tachypnea nor retractions. Breath sounds are clear and equal bilaterally. No wheezes/rales/rhonchi. Gastrointestinal: Soft and nontender. No  distention. There is no CVA tenderness. Genitourinary: deferred Musculoskeletal: Nontender with normal range of motion in all extremities. No joint effusions.  No lower extremity tenderness nor edema. Neurologic:  Normal speech and language. No gross focal neurologic deficits are appreciated. Speech is normal.  Skin:  Skin is warm, dry and intact. No rash noted. Psychiatric: Mood and affect are normal. Speech and behavior are normal. Patient exhibits appropriate insight and judgment.  ____________________________________________    LABS (pertinent positives/negatives)  Labs Reviewed  CBC - Abnormal; Notable for the following:    WBC 10.8 (*)    RBC 4.21 (*)    HCT 39.5 (*)    All other components within normal limits  COMPREHENSIVE METABOLIC PANEL - Abnormal; Notable for the following:    Glucose, Bld 147 (*)    BUN 28 (*)    Creatinine, Ser 1.46 (*)    GFR calc non Af Amer 47 (*)    GFR calc Af Amer 54 (*)    All other components within normal limits  URINALYSIS COMPLETEWITH MICROSCOPIC ([facility] ONLY)     RADIOLOGY   MR Lumbar Spine Wo Contrast (Final result) Result time: 09/13/15 09:13:27   Final result by Rad Results In Interface (09/13/15 09:13:27)   Narrative:   CLINICAL DATA: Low back pain, eccentric to the LEFT, for 1 year. Worse recently. No radicular symptoms reported. Initial encounter.  EXAM: MRI LUMBAR SPINE WITHOUT CONTRAST  TECHNIQUE: Multiplanar, multisequence MR imaging of the lumbar spine was performed. No intravenous contrast was administered.  COMPARISON: CT abdomen pelvis 09/13/2015.  FINDINGS: Segmentation: Normal.  Alignment: 6 mm of anterolisthesis L5 on S1. BILATERAL L5 spondylolysis, in conjunction with facet arthropathy, contributes to the slip. Trace anterolisthesis L4-5, with large facet joint effusions at this level.  Vertebrae: No worrisome osseous lesion.  Conus medullaris: Normal in size, signal, and location.  Paraspinal  tissues: No evidence for hydronephrosis or paravertebral mass. BILATERAL renal cystic disease, incompletely evaluated.  Disc levels:  L1-L2: Normal.  L2-L3: Normal.  L3-L4: Broad-based central protrusion. Mild facet arthropathy. No impingement.  L4-L5: Trace anterolisthesis. Shallow central protrusion. Large BILATERAL facet joint effusions. Slight LEFT subarticular zone narrowing. No impingement.  L5-S1: 6 mm anterolisthesis. BILATERAL pars defects (better seen on CT, although somewhat atypical slightly below the narrowest portion of the pars interarticularis) as well as facet arthropathy contribute to slip. There is uncovering of the disc without significant protrusion. Mild foraminal zone narrowing could affect the L5 nerve roots. Subarticular zone narrowing not established.  IMPRESSION: 6 mm anterolisthesis L5-S1 relates to both L5 pars defects and facet arthropathy. Correlate clinically for BILATERAL L5 radicular symptoms.  Shallow central protrusion L4-5 in association with BILATERAL facet arthropathy. LEFT subarticular zone narrowing could affect the LEFT L5 nerve root.  Broad-based central protrusion L3-4, noncompressive.   Electronically Signed By: Staci Righter M.D. On: 09/13/2015 09:13          CT RENAL STONE STUDY (Final result) Result time: 09/13/15 06:55:06   Final result by Rad Results In Interface (09/13/15 06:55:06)   Narrative:   CLINICAL DATA: Left lower back pain for 1 year, worsened last night.  EXAM: CT ABDOMEN AND PELVIS WITHOUT CONTRAST  TECHNIQUE: Multidetector CT imaging of the abdomen and pelvis was performed following the standard protocol without IV contrast.  COMPARISON: None.  FINDINGS: Lower chest: Increased AP diameter of the thorax. Linear atelectasis in the lingula. No consolidation or pleural effusion.  Liver: No focal lesion allowing for lack contrast.  Hepatobiliary: Gallbladder physiologically distended  need to multiple dependent gallstones. No gallbladder inflammation. No biliary dilatation.  Pancreas: Normal. No inflammation or ductal dilatation.  Spleen: Normal.  Adrenal glands: No nodule.  Kidneys: Symmetric in size without stones or hydronephrosis. Cyst in the upper right kidney measures 5.2 cm. The 1.8 cm cortical cyst is seen in the upper left kidney. Suspect additional small cyst in the lower right kidney, not well defined given lack contrast. There is no perinephric stranding. Both ureters are decompressed without stones along the course.  Stomach/Bowel: Stomach decompressed. There are no dilated or thickened small bowel loops. Moderate colonic diverticulosis from the descending through the sigmoid colon. No diverticulitis. Moderate volume of stool throughout the colon without colonic wall thickening. The appendix is not seen.  Vascular/Lymphatic: No retroperitoneal adenopathy. Abdominal aorta is normal in caliber. Mild atherosclerosis without aneurysm.  Reproductive: Prostate gland normal in size. Soft tissue density in the right inguinal canal, likely high-riding right testicle.  Bladder: Physiologically distended, no wall thickening.  Other: No free air, free fluid, or intra-abdominal fluid collection.  Musculoskeletal: There are no acute or suspicious osseous abnormalities. Degenerative disc disease and facet arthropathy in the lumbar spine. Grade 1 anterolisthesis of L5 on S1 is degenerative.  : 1. No renal stones or obstructive uropathy. No acute abnormality in the abdomen/pelvis. 2. Diverticulosis without diverticulitis. Cholelithiasis without gallbladder inflammation. 3. Soft tissue density in the right inguinal canal is likely high-riding testis, however correlation with physical exam recommended. 4. Degenerative change in the lumbar spine.   Electronically Signed By: Jeb Levering M.D. On: 09/13/2015 06:55          INITIAL  IMPRESSION / ASSESSMENT AND PLAN / ED COURSE  Pertinent labs & imaging results that were available during my care of the patient were reviewed by me and considered in my medical decision making (see chart for details).  Patient received IV morphine and Zofran. History of physical exam concerning for possible ureterolithiasis versus musculoskeletal pain.  ____________________________________________   FINAL CLINICAL IMPRESSION(S) / ED DIAGNOSES  Final diagnoses:  Left flank pain  Lumbar spine pain      Gregor Hams, MD 09/15/15 [phone]

## 2015-09-13 NOTE — ED Notes (Addendum)
Pt to triage via w/c with no distress noted; pt reports left lower back pain x year, worse since last night; denies radiating pain

## 2015-09-13 NOTE — ED Notes (Signed)
Pt returned from MRI at this time. NAD noted.

## 2015-09-13 NOTE — ED Notes (Signed)
Pt resting in bed with wife at bedside at this time. Pt speaking with MRI for screening questions at this time. NAD noted at this time.

## 2015-09-13 NOTE — ED Notes (Signed)
Pt taken to MRI. O2 sats WNL after pt receiving 1mg  of Dilaudid.

## 2015-09-13 NOTE — ED Notes (Signed)
Report received from Karen, RN

## 2015-09-19 ENCOUNTER — Encounter: Payer: Self-pay | Admitting: Family Medicine

## 2015-09-19 ENCOUNTER — Ambulatory Visit (INDEPENDENT_AMBULATORY_CARE_PROVIDER_SITE_OTHER): Payer: Commercial Managed Care - HMO | Admitting: Family Medicine

## 2015-09-19 VITALS — BP 122/66 | HR 68 | Temp 97.9°F | Wt 247.8 lb

## 2015-09-19 DIAGNOSIS — M4696 Unspecified inflammatory spondylopathy, lumbar region: Secondary | ICD-10-CM | POA: Diagnosis not present

## 2015-09-19 DIAGNOSIS — M47816 Spondylosis without myelopathy or radiculopathy, lumbar region: Secondary | ICD-10-CM

## 2015-09-19 MED ORDER — HYDROMORPHONE HCL 2 MG PO TABS: 2.0000 mg | ORAL_TABLET | Freq: Two times a day (BID) | ORAL | Status: DC | PRN

## 2015-09-19 NOTE — Assessment & Plan Note (Addendum)
MRI showing anterolisthesis L5/S1 with B L5 pars defects and facet arthropathy as well as L4/5 central protrusion with facet arthropathy and possible L L5 nerve root impingement (08/2015). sxs have now resolved. Discussed with patient, rec increased walking, work on weight loss, and update if recurrent flare of pain for ortho referral. Discussed PT referral. Provided with refill of dilaudid to use PRN recurrent flare (morphine not effecting at ER). Discussed aleve use as well. Pt agrees with plan.

## 2015-09-19 NOTE — Progress Notes (Signed)
Pre visit review using our clinic review tool, if applicable. No additional management support is needed unless otherwise documented below in the visit note. 

## 2015-09-19 NOTE — Patient Instructions (Addendum)
I've refilled dilaudid in case another flare. May take with aleve daily to start. If not improved with this call me. If repeated flares we will recommend seeing ortho.  In interim, stay active, lots of walking, work on weight.

## 2015-09-19 NOTE — Progress Notes (Signed)
BP 122/66 mmHg  Pulse 68  Temp(Src) 97.9 F (36.6 C)  Wt 247 lb 12 oz (112.379 kg)   CC: ER f/u visit  Subjective:    Patient ID: Thomas Carlson, male    DOB: 1946/03/20, 69 y.o.   MRN: [id number]  HPI: Thomas Carlson is a 69 y.o. male presenting on 09/19/2015 for Follow-up   Antibiotic resolved diarrhea (see prior office note).   Recent eval at ER 09/13/2015 with worsening left lower back pain without radiation, treated with IV morphine and zofran. Morphine didn't help much. Dilaudid helped. CT negative for renal stones. Diverticulosis without diverticulitis. ?high-riding testicle R inguinal canal. MRI obtained showing L5 pars defects with facet arthropaty, discharged home with steroid taper and pain regimen. Steroid course resolved pain.   Has not been on other oral pain meds in past but morphine in hospital did not help.   MRI IMPRESSION: 6 mm anterolisthesis L5-S1 relates to both L5 pars defects and facet arthropathy. Correlate clinically for BILATERAL L5 radicular symptoms.  Shallow central protrusion L4-5 in association with BILATERAL facet arthropathy. LEFT subarticular zone narrowing could affect the LEFT L5 nerve root.  Broad-based central protrusion L3-4, noncompressive.   Relevant past medical, surgical, family and social history reviewed and updated as indicated. Interim medical history since our last visit reviewed. Allergies and medications reviewed and updated. Current Outpatient Prescriptions on File Prior to Visit  Medication Sig  . acyclovir (ZOVIRAX) 400 MG tablet Take 1 tablet (400 mg total) by mouth 2 (two) times daily.  Marland Kitchen aspirin EC 81 MG tablet Take 81 mg by mouth daily.  Marland Kitchen atenolol-chlorthalidone (TENORETIC) 50-25 MG per tablet Take 1 tablet by mouth daily.  . Cholecalciferol (VITAMIN D3) 3000 UNITS TABS Take 3,000 Units by mouth daily.  . ciprofloxacin (CIPRO) 250 MG tablet Take two tablets twice daily for 1 day then one tablet twice daily for 2 days    . diphenoxylate-atropine (LOMOTIL) 2.5-0.025 MG tablet Take 1-2 tablets by mouth 4 (four) times daily as needed for diarrhea or loose stools.  . indomethacin (INDOCIN) 50 MG capsule Take 50 mg by mouth daily as needed (for gout flare-up).   Marland Kitchen lovastatin (MEVACOR) 40 MG tablet Take 2 tablets (80 mg total) by mouth at bedtime.  . metFORMIN (GLUCOPHAGE-XR) 500 MG 24 hr tablet Take 1 tablet (500 mg total) by mouth 2 (two) times daily.  . methylPREDNISolone (MEDROL DOSEPAK) 4 MG TBPK tablet Take dosepak as directed  . Multiple Vitamin (MULTIVITAMIN) tablet Take 1 tablet by mouth daily.  . Omega-3 Fatty Acids (FISH OIL) 1360 MG CAPS Take 1 capsule by mouth daily.   . quinapril (ACCUPRIL) 40 MG tablet Take 1 tablet (40 mg total) by mouth at bedtime.  . vitamin B-12 (CYANOCOBALAMIN) 1000 MCG tablet Take 1 tablet (1,000 mcg total) by mouth daily. (Patient not taking: Reported on 09/13/2015)   No current facility-administered medications on file prior to visit.    Review of Systems Per HPI unless specifically indicated in ROS section     Objective:    BP 122/66 mmHg  Pulse 68  Temp(Src) 97.9 F (36.6 C)  Wt 247 lb 12 oz (112.379 kg)  Wt Readings from Last 3 Encounters:  09/19/15 247 lb 12 oz (112.379 kg)  09/13/15 247 lb (112.038 kg)  09/04/15 247 lb (112.038 kg)    Physical Exam  Constitutional: He appears well-developed and well-nourished. No distress.  Musculoskeletal: Normal range of motion. He exhibits no edema.  No pain midline  spine No paraspinous mm tenderness Neg SLR bilaterally. No pain with int/ext rotation at hip. Neg FABER.  Neurological: He is alert. He has normal strength. No sensory deficit.  Reflex Scores:      Patellar reflexes are 2+ on the right side and 2+ on the left side. 5/5 strength BLE  Skin: Skin is warm and dry. No rash noted.  Psychiatric: He has a normal mood and affect.  Nursing note and vitals reviewed.  Results for orders placed or performed  during the hospital encounter of 09/13/15  CBC  Result Value Ref Range   WBC 10.8 (H) 3.8 - 10.6 K/uL   RBC 4.21 (L) 4.40 - 5.90 MIL/uL   Hemoglobin 13.2 13.0 - 18.0 g/dL   HCT 39.5 (L) 40.0 - 52.0 %   MCV 93.9 80.0 - 100.0 fL   MCH 31.4 26.0 - 34.0 pg   MCHC 33.5 32.0 - 36.0 g/dL   RDW 14.0 11.5 - 14.5 %   Platelets 230 150 - 440 K/uL  Comprehensive metabolic panel  Result Value Ref Range   Sodium 139 135 - 145 mmol/L   Potassium 3.7 3.5 - 5.1 mmol/L   Chloride 106 101 - 111 mmol/L   CO2 25 22 - 32 mmol/L   Glucose, Bld 147 (H) 65 - 99 mg/dL   BUN 28 (H) 6 - 20 mg/dL   Creatinine, Ser 1.46 (H) 0.61 - 1.24 mg/dL   Calcium 8.9 8.9 - 10.3 mg/dL   Total Protein 6.8 6.5 - 8.1 g/dL   Albumin 3.7 3.5 - 5.0 g/dL   AST 23 15 - 41 U/L   ALT 20 17 - 63 U/L   Alkaline Phosphatase 59 38 - 126 U/L   Total Bilirubin 0.8 0.3 - 1.2 mg/dL   GFR calc non Af Amer 47 (L) >60 mL/min   GFR calc Af Amer 54 (L) >60 mL/min   Anion gap 8 5 - 15  Urinalysis complete, with microscopic ([facility] only)  Result Value Ref Range   Color, Urine YELLOW (A) YELLOW   APPearance CLEAR (A) CLEAR   Glucose, UA NEGATIVE NEGATIVE mg/dL   Bilirubin Urine NEGATIVE NEGATIVE   Ketones, ur NEGATIVE NEGATIVE mg/dL   Specific Gravity, Urine 1.020 1.005 - 1.030   Hgb urine dipstick NEGATIVE NEGATIVE   pH 5.0 5.0 - 8.0   Protein, ur NEGATIVE NEGATIVE mg/dL   Nitrite NEGATIVE NEGATIVE   Leukocytes, UA NEGATIVE NEGATIVE   RBC / HPF 0-5 0 - 5 RBC/hpf   WBC, UA 0-5 0 - 5 WBC/hpf   Bacteria, UA NONE SEEN NONE SEEN   Squamous Epithelial / LPF 0-5 (A) NONE SEEN   Mucous PRESENT    Hyaline Casts, UA PRESENT       Assessment & Plan:   Problem List Items Addressed This Visit    Lumbar arthropathy ([place]) - Primary    MRI showing anterolisthesis L5/S1 with B L5 pars defects and facet arthropathy as well as L4/5 central protrusion with facet arthropathy and possible L L5 nerve root impingement (08/2015). sxs have now resolved.  Discussed with patient, rec increased walking, work on weight loss, and update if recurrent flare of pain for ortho referral. Discussed PT referral. Provided with refill of dilaudid to use PRN recurrent flare (morphine not effecting at ER). Discussed aleve use as well. Pt agrees with plan.           Follow up plan: Return if symptoms worsen or fail to improve.

## 2015-09-28 ENCOUNTER — Ambulatory Visit (INDEPENDENT_AMBULATORY_CARE_PROVIDER_SITE_OTHER): Payer: Commercial Managed Care - HMO | Admitting: Internal Medicine

## 2015-09-28 ENCOUNTER — Encounter: Payer: Self-pay | Admitting: Internal Medicine

## 2015-09-28 VITALS — BP 126/68 | HR 72 | Temp 98.2°F | Wt 245.0 lb

## 2015-09-28 DIAGNOSIS — R197 Diarrhea, unspecified: Secondary | ICD-10-CM | POA: Diagnosis not present

## 2015-09-28 DIAGNOSIS — K529 Noninfective gastroenteritis and colitis, unspecified: Secondary | ICD-10-CM | POA: Diagnosis not present

## 2015-09-28 MED ORDER — METRONIDAZOLE 500 MG PO TABS: 500.0000 mg | ORAL_TABLET | Freq: Three times a day (TID) | ORAL | Status: DC

## 2015-09-28 MED ORDER — CIPROFLOXACIN HCL 250 MG PO TABS: ORAL_TABLET | ORAL | Status: DC

## 2015-09-28 NOTE — Progress Notes (Signed)
Subjective:    Patient ID: Thomas Carlson, male    DOB: 01-28-1946, 69 y.o.   MRN: [id number]  HPI  Pt presents to the clinic today with c/o diarrhea. This started 4 days ago. He has 5-6 loose stools per day. He denies blood in the stool or abdominal pain/cramping or gas. He saw Allie Bossier 10/27 for the same. Presumed viral, he was given RX for Lomotil. Symptoms did not improve, so he saw Dr. Lynnae Sandhoff 11/7 for the same- presumed infections, treated with a 3 day course of Cipro. Cdiff and stool culture negative. Symptoms improved with antibiotics. He has tried Lomotil and Imodium this time without any relief. He had a colonoscopy in 2013 which showed diverticulosis. He also had a CT renal stone study 11/16 which again saw diverticulosis but no infection. He does report he recently restarted his B12 supplement (which Dr. Darnell Level had him hold in case it was contributing to his last case of diarrhea). Other than that, he denies changes in medications, diet. He has not traveled out of the country. He has not had contacts with people who have similar symptoms.  Review of Systems      Past Medical History  Diagnosis Date  . Diabetes mellitus ([place]) 2010  . HTN (hypertension)   . HLD (hyperlipidemia)   . Genital herpes   . CAD (coronary artery disease) 2005    s/p stent ([place])  . Gout   . Obesity   . DJD (degenerative joint disease)     knee  . Chronic kidney disease, stage 3, mod decreased GFR   . Diverticulosis     by CT  . Cholelithiasis     by CT    Current Outpatient Prescriptions  Medication Sig Dispense Refill  . acyclovir (ZOVIRAX) 400 MG tablet Take 1 tablet (400 mg total) by mouth 2 (two) times daily. 180 tablet 1  . aspirin EC 81 MG tablet Take 81 mg by mouth daily.    Marland Kitchen atenolol-chlorthalidone (TENORETIC) 50-25 MG per tablet Take 1 tablet by mouth daily. 90 tablet 3  . Cholecalciferol (VITAMIN D3) 3000 UNITS TABS Take 3,000 Units by mouth daily.    . ciprofloxacin (CIPRO) 250  MG tablet Take two tablets twice daily for 1 day then one tablet twice daily for 2 days 8 tablet 0  . diphenoxylate-atropine (LOMOTIL) 2.5-0.025 MG tablet Take 1-2 tablets by mouth 4 (four) times daily as needed for diarrhea or loose stools. 30 tablet 0  . HYDROmorphone (DILAUDID) 2 MG tablet Take 1 tablet (2 mg total) by mouth every 12 (twelve) hours as needed for severe pain. 30 tablet 0  . indomethacin (INDOCIN) 50 MG capsule Take 50 mg by mouth daily as needed (for gout flare-up).     Marland Kitchen lovastatin (MEVACOR) 40 MG tablet Take 2 tablets (80 mg total) by mouth at bedtime. 180 tablet 3  . metFORMIN (GLUCOPHAGE-XR) 500 MG 24 hr tablet Take 1 tablet (500 mg total) by mouth 2 (two) times daily. 180 tablet 3  . methylPREDNISolone (MEDROL DOSEPAK) 4 MG TBPK tablet Take dosepak as directed 21 tablet 0  . Multiple Vitamin (MULTIVITAMIN) tablet Take 1 tablet by mouth daily.    . Omega-3 Fatty Acids (FISH OIL) 1360 MG CAPS Take 1 capsule by mouth daily.     . quinapril (ACCUPRIL) 40 MG tablet Take 1 tablet (40 mg total) by mouth at bedtime. 90 tablet 3  . vitamin B-12 (CYANOCOBALAMIN) 1000 MCG tablet Take 1 tablet (1,000 mcg  total) by mouth daily.     No current facility-administered medications for this visit.    Allergies  Allergen Reactions  . Penicillins Hives  . Bactrim [Sulfamethoxazole-Trimethoprim] Rash    Diffuse drug reaction - maculopapular rash    Family History  Problem Relation Age of Onset  . Diabetes Father 3  . Coronary artery disease Sister     catheterizations  . COPD Brother   . Stroke Brother   . Hypertension Brother   . Cancer Neg Hx     Social History   Social History  . Marital Status: Married    Spouse Name: N/A  . Number of Children: N/A  . Years of Education: N/A   Occupational History  . Not on file.   Social History Main Topics  . Smoking status: Never Smoker   . Smokeless tobacco: Never Used  . Alcohol Use: No  . Drug Use: No  . Sexual Activity:  Not on file   Other Topics Concern  . Not on file   Social History Narrative   Caffeine: 2 cans of diet soda/day   Lives with wife.  1 dog at home.  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  Respiratory: Denies difficulty breathing, shortness of breath, cough or sputum production.   Cardiovascular: Denies chest pain, chest tightness, palpitations or swelling in the hands or feet.  Gastrointestinal: Pt reports diarrhea. Denies abdominal pain, bloating, constipation, or blood in the stool.   No other specific complaints in a complete review of systems (except as listed in HPI above).  Objective:   Physical Exam  BP 126/68 mmHg  Pulse 72  Temp(Src) 98.2 F (36.8 C) (Oral)  Wt 245 lb (111.131 kg)  SpO2 98% Wt Readings from Last 3 Encounters:  09/28/15 245 lb (111.131 kg)  09/19/15 247 lb 12 oz (112.379 kg)  09/13/15 247 lb (112.038 kg)    General: Appears his stated age, obese in NAD.  Cardiovascular: Normal rate and rhythm. S1,S2 noted.   Pulmonary/Chest: Normal effort and positive vesicular breath sounds. No respiratory distress. No wheezes, rales or ronchi noted.  Abdomen: Soft and nontender. Normal bowel sounds. No distention or masses noted.  Neurological: Alert and oriented.   BMET    Component Value Date/Time   NA 139 09/13/2015 0553   NA 143 05/19/2014   K 3.7 09/13/2015 0553   K 4.3 05/19/2014   CL 106 09/13/2015 0553   CL 102 07/21/2013   CO2 25 09/13/2015 0553   GLUCOSE 147* 09/13/2015 0553   BUN 28* 09/13/2015 0553   BUN 35* 05/19/2014   CREATININE 1.46* 09/13/2015 0553   CREATININE 1.50 05/19/2014   CALCIUM 8.9 09/13/2015 0553   CALCIUM 8.9 07/21/2013   GFRNONAA 47* 09/13/2015 0553   GFRAA 54* 09/13/2015 0553     Lipid Panel     Component Value Date/Time   CHOL 138 07/13/2015 1223   CHOL 162 06/27/2011   TRIG 165.0* 07/13/2015 1223   TRIG 163 05/19/2014   TRIG 248 06/27/2011   HDL 39.70 07/13/2015 1223   CHOLHDL 3 07/13/2015 1223   VLDL 33.0 07/13/2015 1223   LDLCALC 66 07/13/2015 1223   LDLCALC 86 05/19/2014    CBC    Component Value Date/Time   WBC  10.8* 09/13/2015 0553   RBC 4.21* 09/13/2015 0553   HGB 13.2 09/13/2015 0553   HGB 13.7 05/19/2014   HGB 13.7 05/19/2014   HCT 39.5* 09/13/2015 0553   PLT 230 09/13/2015 0553   MCV 93.9 09/13/2015 0553   MCH 31.4 09/13/2015 0553   MCHC 33.5 09/13/2015 0553   RDW 14.0 09/13/2015 0553   LYMPHSABS 1.5 08/24/2015 1533   MONOABS 0.9 08/24/2015 1533   EOSABS 0.3 08/24/2015 1533   BASOSABS 0.1 08/24/2015 1533    Hgb A1C Lab Results  Component Value Date   HGBA1C 6.3 07/13/2015         Assessment & Plan:   Diarrhea, recurrent:  ? colitis Does not seem viral He is not taking any Mirilax, Senna or Colace OTC Go ahead and stop B12 in case this is the contributing factor No need to repeat labs or stool studies Will treat with 7 days of Cipro and Flagyl If symptoms persist, consider referral to GI  RTC as needed or if symptoms persist or worsen

## 2015-09-28 NOTE — Patient Instructions (Signed)
°  Colitis °Colitis is inflammation of the colon. Colitis may last a short time (acute) or it may last a long time (chronic). °CAUSES °This condition may be caused by: °· Viruses. °· Bacteria. °· Reactions to medicine. °· Certain autoimmune diseases, such as Crohn disease or ulcerative colitis. °SYMPTOMS °Symptoms of this condition include: °· Diarrhea. °· Passing bloody or tarry stool. °· Pain. °· Fever. °· Vomiting. °· Tiredness (fatigue). °· Weight loss. °· Bloating. °· Sudden increase in abdominal pain. °· Having fewer bowel movements than usual. °DIAGNOSIS °This condition is diagnosed with a stool test or a blood test. You may also have other tests, including X-rays, a CT scan, or a colonoscopy. °TREATMENT °Treatment may include: °· Resting the bowel. This involves not eating or drinking for a period of time. °· Fluids that are given through an IV tube. °· Medicine for pain and diarrhea. °· Antibiotic medicines. °· Cortisone medicines. °· Surgery. °HOME CARE INSTRUCTIONS °Eating and Drinking °· Follow instructions from your health care provider about eating or drinking restrictions. °· Drink enough fluid to keep your urine clear or pale yellow. °· Work with a dietitian to determine which foods cause your condition to flare up. °· Avoid foods that cause flare-ups. °· Eat a well-balanced diet. °Medicines °· Take over-the-counter and prescription medicines only as told by your health care provider. °· If you were prescribed an antibiotic medicine, take it as told by your health care provider. Do not stop taking the antibiotic even if you start to feel better. °General Instructions °· Keep all follow-up visits as told by your health care provider. This is important. °SEEK MEDICAL CARE IF: °· Your symptoms do not go away. °· You develop new symptoms. °SEEK IMMEDIATE MEDICAL CARE IF: °· You have a fever that does not go away with treatment. °· You develop chills. °· You have extreme weakness, fainting, or  dehydration. °· You have repeated vomiting. °· You develop severe pain in your abdomen. °· You pass bloody or tarry stool. °  °This information is not intended to replace advice given to you by your health care provider. Make sure you discuss any questions you have with your health care provider. °  °Document Released: 11/21/2004 Document Revised: 07/05/2015 Document Reviewed: 02/06/2015 °Elsevier Interactive Patient Education ©2016 [employer] Inc[employer] ° °

## 2015-09-28 NOTE — Progress Notes (Signed)
Pre visit review using our clinic review tool, if applicable. No additional management support is needed unless otherwise documented below in the visit note. 

## 2015-10-02 ENCOUNTER — Ambulatory Visit: Payer: Commercial Managed Care - HMO | Admitting: Family Medicine

## 2015-10-31 ENCOUNTER — Telehealth: Payer: Self-pay | Admitting: Family Medicine

## 2015-10-31 DIAGNOSIS — R197 Diarrhea, unspecified: Secondary | ICD-10-CM

## 2015-10-31 NOTE — Telephone Encounter (Signed)
Ongoing diarrhea. plz notify referral placed.

## 2015-10-31 NOTE — Telephone Encounter (Signed)
Pt was in to see Webb Silversmith 09/28/2015 for diarrhea and she gave him 2 prescriptions and says if they don't help then he will need a GI referral.  Does he need to see you or can you place the referral for GI?  Please advise. Pt requests c/b [phone] Thank you.

## 2015-11-01 NOTE — Telephone Encounter (Signed)
Patient notified and will await call.  

## 2015-11-06 ENCOUNTER — Other Ambulatory Visit: Payer: Self-pay

## 2015-11-07 ENCOUNTER — Ambulatory Visit (INDEPENDENT_AMBULATORY_CARE_PROVIDER_SITE_OTHER): Payer: Commercial Managed Care - HMO | Admitting: Gastroenterology

## 2015-11-07 ENCOUNTER — Encounter: Payer: Self-pay | Admitting: *Deleted

## 2015-11-07 ENCOUNTER — Encounter: Payer: Self-pay | Admitting: Gastroenterology

## 2015-11-07 ENCOUNTER — Other Ambulatory Visit: Payer: Self-pay

## 2015-11-07 VITALS — BP 111/64 | HR 72 | Temp 97.9°F

## 2015-11-07 DIAGNOSIS — R197 Diarrhea, unspecified: Secondary | ICD-10-CM | POA: Diagnosis not present

## 2015-11-07 NOTE — Progress Notes (Signed)
Gastroenterology Consultation  Referring Provider:     Ria Bush, MD Primary Care Physician:  Ria Bush, MD Primary Gastroenterologist:  Dr. Allen Norris     Reason for Consultation:     Diarrhea        HPI:   Thomas Carlson is a 70 y.o. y/o male referred for consultation & management of diarrhea by Dr. Ria Bush, MD.  This patient comes today with a history of diarrhea. He states that his been going on for few months. The patient was seen by his primary care doctor and his stools were negative but upon returning with continued diarrhea the patient was started on antibiotics. He states that his diarrhea got slightly better for a few days but then started to be diarrhea again. He also returned to his doctor after having the antibiotics and was prescribed another antibiotic. The patient states that that also stopped his diarrhea for a short time but it came right back. The patient reports that his stools are now much more firm than they have been in the past but he is worried that it may come back to diarrhea. There is no report of any black stools or bloody stools. He also denies any unexplained weight loss. The patient does state that he had a colonoscopy by me back in 2009 that was uneventful. There is no report of nausea vomiting but he does report some bloating and some intestinal cramps at times. He denies any dairy intake and reports that there is no foods that he can pinpoint that make his symptoms any better or worse. The diarrhea also does not wake him up from a sleep.  Past Medical History  Diagnosis Date  . Diabetes mellitus ([place]) 2010  . HTN (hypertension)   . HLD (hyperlipidemia)   . Genital herpes   . CAD (coronary artery disease) 2005    s/p stent ([place])  . Gout   . Obesity   . DJD (degenerative joint disease)     knee  . Chronic kidney disease, stage 3, mod decreased GFR   . Diverticulosis     by CT  . Cholelithiasis     by CT    Past Surgical History   Procedure Laterality Date  . Appendectomy  1958  . Hernia repair  1956  . Subdural hematoma evacuation via craniotomy  1964    hit in helmet by baseball  . Coronary angioplasty with stent placement  2005    stent 2005  . Closed reduction clavicle fracture  1955  . Knee arthroscopy  2008    torn meniscus  . Colonoscopy  02/2008    int hemorrhoids, diverticula (Dr. Allen Norris)    Prior to Admission medications   Medication Sig Start Date End Date Taking? Authorizing Provider  acyclovir (ZOVIRAX) 400 MG tablet Take 1 tablet (400 mg total) by mouth 2 (two) times daily. 07/13/15  Yes Ria Bush, MD  aspirin EC 81 MG tablet Take 81 mg by mouth daily.   Yes Historical Provider, MD  atenolol-chlorthalidone (TENORETIC) 50-25 MG per tablet Take 1 tablet by mouth daily. 07/13/15  Yes Ria Bush, MD  Cholecalciferol (VITAMIN D3) 3000 UNITS TABS Take 3,000 Units by mouth daily.   Yes Historical Provider, MD  HYDROmorphone (DILAUDID) 2 MG tablet Take 1 tablet (2 mg total) by mouth every 12 (twelve) hours as needed for severe pain. 09/19/15 09/18/16 Yes Ria Bush, MD  indomethacin (INDOCIN) 50 MG capsule Take 50 mg by mouth daily as needed (  for gout flare-up).    Yes Historical Provider, MD  lovastatin (MEVACOR) 40 MG tablet Take 2 tablets (80 mg total) by mouth at bedtime. 07/13/15  Yes Ria Bush, MD  metFORMIN (GLUCOPHAGE-XR) 500 MG 24 hr tablet Take 1 tablet (500 mg total) by mouth 2 (two) times daily. 07/13/15 07/12/16 Yes Ria Bush, MD  Multiple Vitamin (MULTIVITAMIN) tablet Take 1 tablet by mouth daily.   Yes Historical Provider, MD  Omega-3 Fatty Acids (FISH OIL) 1360 MG CAPS Take 1 capsule by mouth daily.    Yes Historical Provider, MD  quinapril (ACCUPRIL) 40 MG tablet Take 1 tablet (40 mg total) by mouth at bedtime. Patient taking differently: Take 20 mg by mouth at bedtime.  07/13/15  Yes Ria Bush, MD  ciprofloxacin (CIPRO) 250 MG tablet Take two tablets twice  daily for 1 day then one tablet twice daily for 2 days Patient not taking: Reported on 11/07/2015 09/28/15   Jearld Fenton, NP  diphenoxylate-atropine (LOMOTIL) 2.5-0.025 MG tablet Reported on 11/07/2015 08/28/15   Historical Provider, MD  methylPREDNISolone (MEDROL DOSEPAK) 4 MG TBPK tablet Take dosepak as directed Patient not taking: Reported on 11/07/2015 09/13/15   Earleen Newport, MD  metroNIDAZOLE (FLAGYL) 500 MG tablet Take 1 tablet (500 mg total) by mouth 3 (three) times daily. Patient not taking: Reported on 11/07/2015 09/28/15   Jearld Fenton, NP  vitamin B-12 (CYANOCOBALAMIN) 1000 MCG tablet Take 1 tablet (1,000 mcg total) by mouth daily. Patient not taking: Reported on 11/07/2015 07/14/15   Ria Bush, MD    Family History  Problem Relation Age of Onset  . Diabetes Father 59  . Coronary artery disease Sister     catheterizations  . COPD Brother   . Stroke Brother   . Hypertension Brother   . Cancer Neg Hx      Social History  Substance Use Topics  . Smoking status: Never Smoker   . Smokeless tobacco: Never Used  . Alcohol Use: No    Allergies as of 11/07/2015 - Review Complete 11/07/2015  Allergen Reaction Noted  . Penicillins Hives 06/18/2012  . Bactrim [sulfamethoxazole-trimethoprim] Rash 05/19/2013    Review of Systems:    All systems reviewed and negative except where noted in HPI.   Physical Exam:  BP 111/64 mmHg  Pulse 72  Temp(Src) 97.9 F (36.6 C) (Oral) No LMP for male patient. Psych:  Alert and cooperative. Normal mood and affect. General:   Alert,  obese, Well-developed, well-nourished, pleasant and cooperative in NAD Head:  Normocephalic and atraumatic. Eyes:  Sclera clear, no icterus.   Conjunctiva pink. Ears:  Normal auditory acuity. Nose:  No deformity, discharge, or lesions. Mouth:  No deformity or lesions,oropharynx pink & moist. Neck:  Supple; no masses or thyromegaly. Lungs:  Respirations even and unlabored.  Clear throughout to  auscultation.   No wheezes, crackles, or rhonchi. No acute distress. Heart:  Regular rate and rhythm; no murmurs, clicks, rubs, or gallops. Abdomen:  Normal bowel sounds.  No bruits.  Soft, non-tender and non-distended without masses, hepatosplenomegaly or hernias noted.  No guarding or rebound tenderness.  Negative Carnett sign.   Rectal:  Deferred.  Msk:  Symmetrical without gross deformities.  Good, equal movement & strength bilaterally. Pulses:  Normal pulses noted. Extremities:  No clubbing or edema.  No cyanosis. Neurologic:  Alert and oriented x3;  grossly normal neurologically. Skin:  Intact without significant lesions or rashes.  No jaundice. Lymph Nodes:  No significant cervical adenopathy. Psych:  Alert and cooperative. Normal mood and affect.  Imaging Studies: No results found.  Assessment and Plan:   Thomas Carlson is a 70 y.o. y/o male who comes to me today with a few months of diarrhea. The patient's diarrhea is not bloody and he has not had any worrisome symptoms such as weight loss or the diarrhea waking him up from a sleep. The patient may have microscopic colitis although other possibilities is his medications versus foods versus new onset irritable bowel syndrome. The patient will be set up for a colonoscopy to evaluate his colon for possible microscopic colitis. The patient has been explained the plan and agrees with it.I have discussed risks & benefits which include, but are not limited to, bleeding, infection, perforation & drug reaction.  The patient agrees with this plan & written consent will be obtained.      Note: This dictation was prepared with Dragon dictation along with smaller phrase technology. Any transcriptional errors that result from this process are unintentional.

## 2015-11-08 ENCOUNTER — Other Ambulatory Visit: Payer: Self-pay

## 2015-11-08 DIAGNOSIS — Z1211 Encounter for screening for malignant neoplasm of colon: Secondary | ICD-10-CM

## 2015-11-08 MED ORDER — PEG 3350-KCL-NA BICARB-NACL 420 G PO SOLR: 4000.0000 mL | ORAL | Status: DC

## 2015-11-09 NOTE — Discharge Instructions (Signed)

## 2015-11-10 ENCOUNTER — Ambulatory Visit: Payer: Commercial Managed Care - HMO | Admitting: Anesthesiology

## 2015-11-10 ENCOUNTER — Other Ambulatory Visit: Payer: Self-pay | Admitting: Gastroenterology

## 2015-11-10 ENCOUNTER — Ambulatory Visit
Admission: RE | Admit: 2015-11-10 | Discharge: 2015-11-10 | Disposition: A | Discharge status: Home / Self Care | Payer: Commercial Managed Care - HMO | Source: Ambulatory Visit | Attending: Gastroenterology | Admitting: Gastroenterology

## 2015-11-10 ENCOUNTER — Encounter
Admission: RE | Disposition: A | Discharge status: Home / Self Care | Payer: Self-pay | Source: Ambulatory Visit | Attending: Gastroenterology

## 2015-11-10 DIAGNOSIS — M109 Gout, unspecified: Secondary | ICD-10-CM | POA: Diagnosis not present

## 2015-11-10 DIAGNOSIS — R011 Cardiac murmur, unspecified: Secondary | ICD-10-CM | POA: Diagnosis not present

## 2015-11-10 DIAGNOSIS — E119 Type 2 diabetes mellitus without complications: Secondary | ICD-10-CM | POA: Diagnosis not present

## 2015-11-10 DIAGNOSIS — Z7982 Long term (current) use of aspirin: Secondary | ICD-10-CM | POA: Insufficient documentation

## 2015-11-10 DIAGNOSIS — I2510 Atherosclerotic heart disease of native coronary artery without angina pectoris: Secondary | ICD-10-CM | POA: Insufficient documentation

## 2015-11-10 DIAGNOSIS — I129 Hypertensive chronic kidney disease with stage 1 through stage 4 chronic kidney disease, or unspecified chronic kidney disease: Secondary | ICD-10-CM | POA: Diagnosis not present

## 2015-11-10 DIAGNOSIS — Z880 Allergy status to penicillin: Secondary | ICD-10-CM | POA: Diagnosis not present

## 2015-11-10 DIAGNOSIS — K641 Second degree hemorrhoids: Secondary | ICD-10-CM | POA: Insufficient documentation

## 2015-11-10 DIAGNOSIS — R197 Diarrhea, unspecified: Secondary | ICD-10-CM | POA: Diagnosis not present

## 2015-11-10 DIAGNOSIS — K5730 Diverticulosis of large intestine without perforation or abscess without bleeding: Secondary | ICD-10-CM | POA: Diagnosis not present

## 2015-11-10 DIAGNOSIS — K529 Noninfective gastroenteritis and colitis, unspecified: Secondary | ICD-10-CM | POA: Diagnosis not present

## 2015-11-10 DIAGNOSIS — E785 Hyperlipidemia, unspecified: Secondary | ICD-10-CM | POA: Diagnosis not present

## 2015-11-10 DIAGNOSIS — E669 Obesity, unspecified: Secondary | ICD-10-CM | POA: Insufficient documentation

## 2015-11-10 DIAGNOSIS — Z7984 Long term (current) use of oral hypoglycemic drugs: Secondary | ICD-10-CM | POA: Insufficient documentation

## 2015-11-10 DIAGNOSIS — N183 Chronic kidney disease, stage 3 (moderate): Secondary | ICD-10-CM | POA: Insufficient documentation

## 2015-11-10 DIAGNOSIS — Z6836 Body mass index (BMI) 36.0-36.9, adult: Secondary | ICD-10-CM | POA: Insufficient documentation

## 2015-11-10 DIAGNOSIS — Z882 Allergy status to sulfonamides status: Secondary | ICD-10-CM | POA: Diagnosis not present

## 2015-11-10 DIAGNOSIS — M1710 Unilateral primary osteoarthritis, unspecified knee: Secondary | ICD-10-CM | POA: Diagnosis not present

## 2015-11-10 DIAGNOSIS — K6389 Other specified diseases of intestine: Secondary | ICD-10-CM | POA: Diagnosis not present

## 2015-11-10 HISTORY — DX: Cardiac murmur, unspecified: R01.1

## 2015-11-10 HISTORY — PX: COLONOSCOPY WITH PROPOFOL: SHX5780

## 2015-11-10 HISTORY — DX: Unspecified convulsions: R56.9

## 2015-11-10 LAB — GLUCOSE, CAPILLARY
GLUCOSE-CAPILLARY: 113 mg/dL — AB (ref 65–99)
Glucose-Capillary: 120 mg/dL — ABNORMAL HIGH (ref 65–99)

## 2015-11-10 SURGERY — COLONOSCOPY WITH PROPOFOL
Anesthesia: Monitor Anesthesia Care | Wound class: Contaminated

## 2015-11-10 MED ORDER — LACTATED RINGERS IV SOLN
INTRAVENOUS | Status: DC | PRN
  Administered 2015-11-10: 09:00:00 via INTRAVENOUS

## 2015-11-10 MED ORDER — PROPOFOL 10 MG/ML IV BOLUS
INTRAVENOUS | Status: DC | PRN
  Administered 2015-11-10: 50 mg via INTRAVENOUS
  Administered 2015-11-10: 120 mg via INTRAVENOUS
  Administered 2015-11-10: 50 mg via INTRAVENOUS
  Administered 2015-11-10 (×5): 20 mg via INTRAVENOUS

## 2015-11-10 MED ORDER — STERILE WATER FOR IRRIGATION IR SOLN
Status: DC | PRN
  Administered 2015-11-10: 10:00:00

## 2015-11-10 MED ORDER — LIDOCAINE HCL (CARDIAC) 20 MG/ML IV SOLN
INTRAVENOUS | Status: DC | PRN
  Administered 2015-11-10: 30 mg via INTRAVENOUS

## 2015-11-10 SURGICAL SUPPLY — 28 items
CANISTER SUCT 1200ML W/VALVE (MISCELLANEOUS) ×3 IMPLANT
FCP ESCP3.2XJMB 240X2.8X (MISCELLANEOUS)
FORCEPS BIOP RAD 4 LRG CAP 4 (CUTTING FORCEPS) ×3 IMPLANT
FORCEPS BIOP RJ4 240 W/NDL (MISCELLANEOUS)
FORCEPS ESCP3.2XJMB 240X2.8X (MISCELLANEOUS) IMPLANT
GOWN CVR UNV OPN BCK APRN NK (MISCELLANEOUS) ×2 IMPLANT
GOWN ISOL THUMB LOOP REG UNIV (MISCELLANEOUS) ×4
HEMOCLIP INSTINCT (CLIP) IMPLANT
INJECTOR VARIJECT VIN23 (MISCELLANEOUS) IMPLANT
KIT CO2 TUBING (TUBING) IMPLANT
KIT DEFENDO VALVE AND CONN (KITS) IMPLANT
KIT ENDO PROCEDURE OLY (KITS) ×3 IMPLANT
LIGATOR MULTIBAND 6SHOOTER MBL (MISCELLANEOUS) IMPLANT
MARKER SPOT ENDO TATTOO 5ML (MISCELLANEOUS) IMPLANT
PAD GROUND ADULT SPLIT (MISCELLANEOUS) IMPLANT
SNARE SHORT THROW 13M SML OVAL (MISCELLANEOUS) IMPLANT
SNARE SHORT THROW 30M LRG OVAL (MISCELLANEOUS) IMPLANT
SPOT EX ENDOSCOPIC TATTOO (MISCELLANEOUS)
SUCTION POLY TRAP 4CHAMBER (MISCELLANEOUS) IMPLANT
TRAP SUCTION POLY (MISCELLANEOUS) IMPLANT
TUBING CONN 6MMX3.1M (TUBING)
TUBING SUCTION CONN 0.25 STRL (TUBING) IMPLANT
UNDERPAD 30X60 958B10 (PK) (MISCELLANEOUS) IMPLANT
VALVE BIOPSY ENDO (VALVE) IMPLANT
VARIJECT INJECTOR VIN23 (MISCELLANEOUS)
WATER AUXILLARY (MISCELLANEOUS) IMPLANT
WATER STERILE IRR 250ML POUR (IV SOLUTION) ×3 IMPLANT
WATER STERILE IRR 500ML POUR (IV SOLUTION) IMPLANT

## 2015-11-10 NOTE — Op Note (Signed)
[facility] Gastroenterology Patient Name: Thomas Carlson Procedure Date: 11/10/2015 9:20 AM MRN: [id number] Account #: [id number] Date of Birth: [birth year] Admit Type: Outpatient Age: 70 Room: [facility] OR ROOM 01 Gender: Male Note Status: Finalized Procedure:         Colonoscopy Indications:       Chronic diarrhea Providers:         [name], MD Referring MD:      Ria Bush (Referring MD) Medicines:         Propofol per Anesthesia Complications:     No immediate complications. Procedure:         Pre-Anesthesia Assessment:                    - Prior to the procedure, a History and Physical was                     performed, and patient medications and allergies were                     reviewed. The patient's tolerance of previous anesthesia                     was also reviewed. The risks and benefits of the procedure                     and the sedation options and risks were discussed with the                     patient. All questions were answered, and informed consent                     was obtained. Prior Anticoagulants: The patient has taken                     no previous anticoagulant or antiplatelet agents. ASA                     Grade Assessment: II - A patient with mild systemic                     disease. After reviewing the risks and benefits, the                     patient was deemed in satisfactory condition to undergo                     the procedure.                    After obtaining informed consent, the colonoscope was                     passed under direct vision. Throughout the procedure, the                     patient's blood pressure, pulse, and oxygen saturations                     were monitored continuously. The Olympus CF H180AL                     colonoscope (S#: [id number]) was introduced through the anus  and advanced to the the terminal ileum. The colonoscopy                     was performed  without difficulty. The patient tolerated                     the procedure well. The quality of the bowel preparation                     was excellent. Findings:      The perianal and digital rectal examinations were normal.      The terminal ileum appeared normal. This was biopsied with a cold       forceps for histology.      Multiple small-mouthed diverticula were found in the sigmoid colon.      Non-bleeding internal hemorrhoids were found during retroflexion. The       hemorrhoids were Grade II (internal hemorrhoids that prolapse but reduce       spontaneously).      Random biopsies were obtained with cold forceps for histology randomly       in the entire colon. Impression:        - The examined portion of the ileum was normal. Biopsied.                    - Diverticulosis in the sigmoid colon.                    - Non-bleeding internal hemorrhoids.                    - Random biopsies were obtained in the entire colon. Recommendation:    - Await pathology results. Procedure Code(s): --- Professional ---                    [phone], Colonoscopy, flexible; with biopsy, single or                     multiple Diagnosis Code(s): --- Professional ---                    K52.9, Noninfective gastroenteritis and colitis,                     unspecified                    K57.30, Diverticulosis of large intestine without                     perforation or abscess without bleeding CPT copyright 2014 American Medical Association. All rights reserved. The codes documented in this report are preliminary and upon coder review may  be revised to meet current compliance requirements. [name], MD 11/10/2015 9:47:46 AM This report has been signed electronically. Number of Addenda: 0 Note Initiated On: 11/10/2015 9:20 AM Scope Withdrawal Time: 0 hours 9 minutes 50 seconds  Total Procedure Duration: 0 hours 12 minutes 28 seconds       [facility]

## 2015-11-10 NOTE — Anesthesia Preprocedure Evaluation (Signed)
Anesthesia Evaluation  Patient identified by MRN, date of birth, ID band Patient awake    Reviewed: Allergy & Precautions, H&P , NPO status , Patient's Chart, lab work & pertinent test results, reviewed documented beta blocker date and time   Airway Mallampati: II  TM Distance: >3 FB Neck ROM: full    Dental no notable dental hx.    Pulmonary neg pulmonary ROS,    Pulmonary exam normal breath sounds clear to auscultation       Cardiovascular Exercise Tolerance: Good hypertension, On Home Beta Blockers and On Medications + CAD  negative cardio ROS Normal cardiovascular exam Rhythm:regular Rate:Normal  Stent placement 2005   Neuro/Psych negative neurological ROS  negative psych ROS   GI/Hepatic negative GI ROS, Neg liver ROS,   Endo/Other  negative endocrine ROSdiabetes, Type 2, Oral Hypoglycemic Agents  Renal/GU Renal diseasenegative Renal ROS  negative genitourinary   Musculoskeletal   Abdominal   Peds  Hematology negative hematology ROS (+)   Anesthesia Other Findings   Reproductive/Obstetrics negative OB ROS                             Anesthesia Physical Anesthesia Plan  ASA: III  Anesthesia Plan: MAC   Post-op Pain Management:    Induction: Intravenous  Airway Management Planned: Nasal Cannula  Additional Equipment:   Intra-op Plan:   Post-operative Plan:   Informed Consent: I have reviewed the patients History and Physical, chart, labs and discussed the procedure including the risks, benefits and alternatives for the proposed anesthesia with the patient or authorized representative who has indicated his/her understanding and acceptance.   Dental Advisory Given  Plan Discussed with: CRNA  Anesthesia Plan Comments:         Anesthesia Quick Evaluation

## 2015-11-10 NOTE — Anesthesia Procedure Notes (Signed)
Procedure Name: MAC Performed by: Amariyah Bazar Pre-anesthesia Checklist: Patient identified, Emergency Drugs available, Suction available, Timeout performed and Patient being monitored Patient Re-evaluated:Patient Re-evaluated prior to inductionOxygen Delivery Method: Nasal cannula Placement Confirmation: positive ETCO2     

## 2015-11-10 NOTE — H&P (Signed)
[facility]  [address] [place], [place] [address] Phone: [phone] Fax : [phone]  Primary Care Physician:  Ria Bush, MD Primary Gastroenterologist:  Dr. Allen Norris  Pre-Procedure History & Physical: HPI:  Thomas Carlson is a 70 y.o. male is here for an colonoscopy.   Past Medical History  Diagnosis Date  . Diabetes mellitus ([place]) 2010  . HTN (hypertension)   . HLD (hyperlipidemia)   . Genital herpes   . CAD (coronary artery disease) 2005    s/p stent ([place])  . Gout   . Obesity   . DJD (degenerative joint disease)     knee  . Chronic kidney disease, stage 3, mod decreased GFR   . Diverticulosis     by CT  . Cholelithiasis     by CT  . Seizures ([employer])     x1 - after craniotomy (1960s)  . Heart murmur     followed by PCP    Past Surgical History  Procedure Laterality Date  . Appendectomy  1958  . Hernia repair  1956  . Subdural hematoma evacuation via craniotomy  1964    hit in helmet by baseball  . Coronary angioplasty with stent placement  2005    stent 2005  . Closed reduction clavicle fracture  1955  . Knee arthroscopy  2008    torn meniscus  . Colonoscopy  02/2008    int hemorrhoids, diverticula (Dr. Allen Norris)    Prior to Admission medications   Medication Sig Start Date End Date Taking? Authorizing Provider  acyclovir (ZOVIRAX) 400 MG tablet Take 1 tablet (400 mg total) by mouth 2 (two) times daily. 07/13/15  Yes Ria Bush, MD  aspirin EC 81 MG tablet Take 81 mg by mouth daily.   Yes Historical Provider, MD  atenolol-chlorthalidone (TENORETIC) 50-25 MG per tablet Take 1 tablet by mouth daily. 07/13/15  Yes Ria Bush, MD  Cholecalciferol (VITAMIN D3) 3000 UNITS TABS Take 3,000 Units by mouth daily.   Yes Historical Provider, MD  indomethacin (INDOCIN) 50 MG capsule Take 50 mg by mouth daily as needed (for gout flare-up).    Yes Historical Provider, MD  lovastatin (MEVACOR) 40 MG tablet Take 2 tablets (80 mg total)  by mouth at bedtime. 07/13/15  Yes Ria Bush, MD  metFORMIN (GLUCOPHAGE-XR) 500 MG 24 hr tablet Take 1 tablet (500 mg total) by mouth 2 (two) times daily. 07/13/15 07/12/16 Yes Ria Bush, MD  Multiple Vitamin (MULTIVITAMIN) tablet Take 1 tablet by mouth daily.   Yes Historical Provider, MD  Omega-3 Fatty Acids (FISH OIL) 1360 MG CAPS Take 1 capsule by mouth daily.    Yes Historical Provider, MD  polyethylene glycol-electrolytes (TRILYTE) 420 g solution Take 4,000 mLs by mouth as directed. Drink one 8oz glass every 30 mins until stools run clear. 11/08/15  Yes [name], MD  quinapril (ACCUPRIL) 40 MG tablet Take 1 tablet (40 mg total) by mouth at bedtime. Patient taking differently: Take 20 mg by mouth at bedtime.  07/13/15  Yes Ria Bush, MD  vitamin B-12 (CYANOCOBALAMIN) 1000 MCG tablet Take 1 tablet (1,000 mcg total) by mouth daily. 07/14/15  Yes Ria Bush, MD  HYDROmorphone (DILAUDID) 2 MG tablet Take 1 tablet (2 mg total) by mouth every 12 (twelve) hours as needed for severe pain. 09/19/15 09/18/16  Ria Bush, MD    Allergies as of 11/07/2015 - Review Complete 11/07/2015  Allergen Reaction Noted  . Penicillins Hives 06/18/2012  . Bactrim [sulfamethoxazole-trimethoprim] Rash 05/19/2013  Family History  Problem Relation Age of Onset  . Diabetes Father 37  . Coronary artery disease Sister     catheterizations  . COPD Brother   . Stroke Brother   . Hypertension Brother   . Cancer Neg Hx     Social History   Social History  . Marital Status: Married    Spouse Name: N/A  . Number of Children: N/A  . Years of Education: N/A   Occupational History  . Not on file.   Social History Main Topics  . Smoking status: Never Smoker   . Smokeless tobacco: Never Used  . Alcohol Use: No  . Drug Use: No  . Sexual Activity: Not on file   Other Topics Concern  . Not on file   Social History Narrative   Caffeine: 2 cans of diet soda/day   Lives with  wife.  1 dog at home.  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 Neck:  Supple; no masses or thyromegaly. Lungs:  Clear throughout to auscultation.    Heart:  Regular rate and rhythm. Abdomen:  Soft, nontender and nondistended. Normal bowel sounds, without guarding, and without rebound.   Neurologic:  Alert and  oriented x4;  grossly normal neurologically.  Impression/Plan: [name] is here for an colonoscopy to be performed for diarrhea  Risks, benefits, limitations, and alternatives regarding  colonoscopy have been reviewed with the patient.  Questions have been answered.  All parties agreeable.   Ollen Bowl, MD  11/10/2015, 8:50 AM

## 2015-11-10 NOTE — Anesthesia Postprocedure Evaluation (Signed)
Anesthesia Post Note  Patient: Thomas Carlson  Procedure(s) Performed: Procedure(s) (LRB): COLONOSCOPY WITH PROPOFOL (N/A)  Patient location during evaluation: PACU Anesthesia Type: MAC Level of consciousness: awake and alert Pain management: pain level controlled Vital Signs Assessment: post-procedure vital signs reviewed and stable Respiratory status: spontaneous breathing, nonlabored ventilation and respiratory function stable Cardiovascular status: stable and blood pressure returned to baseline Anesthetic complications: no    Trecia Rogers

## 2015-11-10 NOTE — Transfer of Care (Signed)
Immediate Anesthesia Transfer of Care Note  Patient: Thomas Carlson  Procedure(s) Performed: Procedure(s) with comments: COLONOSCOPY WITH PROPOFOL (N/A) - diabetic - oral meds  Patient Location: PACU  Anesthesia Type: MAC  Level of Consciousness: awake, alert  and patient cooperative  Airway and Oxygen Therapy: Patient Spontanous Breathing and Patient connected to supplemental oxygen  Post-op Assessment: Post-op Vital signs reviewed, Patient's Cardiovascular Status Stable, Respiratory Function Stable, Patent Airway and No signs of Nausea or vomiting  Post-op Vital Signs: Reviewed and stable  Complications: No apparent anesthesia complications

## 2015-11-13 ENCOUNTER — Encounter: Payer: Self-pay | Admitting: Gastroenterology

## 2015-11-16 ENCOUNTER — Telehealth: Payer: Self-pay

## 2015-11-16 NOTE — Telephone Encounter (Signed)
Pt has been scheduled for follow results from his colonoscopy on Friday, 11/17/15 at 11:00am.

## 2015-11-16 NOTE — Telephone Encounter (Signed)
-----   Message from [name], MD sent at 11/14/2015 12:48 PM EST ----- Please have the patient come into the office to review the pathology.

## 2015-11-17 ENCOUNTER — Encounter: Payer: Self-pay | Admitting: Gastroenterology

## 2015-11-17 ENCOUNTER — Ambulatory Visit (INDEPENDENT_AMBULATORY_CARE_PROVIDER_SITE_OTHER): Payer: Commercial Managed Care - HMO | Admitting: Gastroenterology

## 2015-11-17 VITALS — BP 137/66 | HR 65 | Temp 98.7°F | Ht 69.0 in | Wt 252.0 lb

## 2015-11-17 DIAGNOSIS — K52832 Lymphocytic colitis: Secondary | ICD-10-CM

## 2015-11-17 NOTE — Progress Notes (Signed)
Primary Care Physician: Ria Bush, MD  Primary Gastroenterologist:  Dr. [name]  Chief Complaint  Patient presents with  . Follow up colonoscopy results    HPI: Thomas Carlson is a 70 y.o. male here for follow-up after having a colonoscopy. The patient states that after the colonoscopy the diarrhea stopped. The patient has had no further diarrhea since that time. The patient's pathology showed him to have an lymphocytic colitis.   Current Outpatient Prescriptions  Medication Sig Dispense Refill  . acyclovir (ZOVIRAX) 400 MG tablet Take 1 tablet (400 mg total) by mouth 2 (two) times daily. 180 tablet 1  . aspirin EC 81 MG tablet Take 81 mg by mouth daily.    Marland Kitchen atenolol-chlorthalidone (TENORETIC) 50-25 MG per tablet Take 1 tablet by mouth daily. 90 tablet 3  . Cholecalciferol (VITAMIN D3) 3000 UNITS TABS Take 3,000 Units by mouth daily.    . indomethacin (INDOCIN) 50 MG capsule Take 50 mg by mouth daily as needed (for gout flare-up).     Marland Kitchen lovastatin (MEVACOR) 40 MG tablet Take 2 tablets (80 mg total) by mouth at bedtime. 180 tablet 3  . metFORMIN (GLUCOPHAGE-XR) 500 MG 24 hr tablet Take 1 tablet (500 mg total) by mouth 2 (two) times daily. 180 tablet 3  . Multiple Vitamin (MULTIVITAMIN) tablet Take 1 tablet by mouth daily.    . Omega-3 Fatty Acids (FISH OIL) 1360 MG CAPS Take 1 capsule by mouth daily.     . quinapril (ACCUPRIL) 40 MG tablet Take 1 tablet (40 mg total) by mouth at bedtime. (Patient taking differently: Take 20 mg by mouth at bedtime. ) 90 tablet 3  . vitamin B-12 (CYANOCOBALAMIN) 1000 MCG tablet Take 1 tablet (1,000 mcg total) by mouth daily.     No current facility-administered medications for this visit.    Allergies as of 11/17/2015 - Review Complete 11/17/2015  Allergen Reaction Noted  . Penicillins Hives 06/18/2012  . Bactrim [sulfamethoxazole-trimethoprim] Rash 05/19/2013    ROS:  General: Negative for anorexia, weight loss, fever, chills,  fatigue, weakness. ENT: Negative for hoarseness, difficulty swallowing , nasal congestion. CV: Negative for chest pain, angina, palpitations, dyspnea on exertion, peripheral edema.  Respiratory: Negative for dyspnea at rest, dyspnea on exertion, cough, sputum, wheezing.  GI: See history of present illness. GU:  Negative for dysuria, hematuria, urinary incontinence, urinary frequency, nocturnal urination.  Endo: Negative for unusual weight change.    Physical Examination:   BP 137/66 mmHg  Pulse 65  Temp(Src) 98.7 F (37.1 C) (Oral)  Ht 5\' 9"  (1.753 m)  Wt 252 lb (114.306 kg)  BMI 37.20 kg/m2  General: Well-nourished, well-developed in no acute distress.  Eyes: No icterus. Conjunctivae pink. Neuro: Alert and oriented x 3.  Grossly intact. Skin: Warm and dry, no jaundice.   Psych: Alert and cooperative, normal mood and affect.  Labs:    Imaging Studies: No results found.  Assessment and Plan:   Thomas Carlson is a 70 y.o. y/o male who comes in today after having a colonoscopy for chronic diarrhea. The patient was found to have lymphocytic colitis. The patient is no longer having the diarrhea at this time and has been told to contact our office if he has any further diarrhea whereupon he will be started on budesonide. The patient has been explained the plan and agrees with it.  Note: This dictation was prepared with Dragon dictation along with smaller phrase technology. Any transcriptional errors that result from this process are  unintentional.

## 2015-11-20 ENCOUNTER — Telehealth: Payer: Self-pay | Admitting: Gastroenterology

## 2015-11-20 ENCOUNTER — Other Ambulatory Visit: Payer: Self-pay

## 2015-11-20 DIAGNOSIS — K51819 Other ulcerative colitis with unspecified complications: Secondary | ICD-10-CM

## 2015-11-20 MED ORDER — BUDESONIDE 3 MG PO CPEP: 9.0000 mg | ORAL_CAPSULE | ORAL | Status: DC

## 2015-11-20 NOTE — Telephone Encounter (Signed)
Per patient he was having a problem with blood in stool, then seemed to clear. Patient said Dr.Wohl said if it came back to call the office so a medication could be called in for him. [facility] [address] Please call patient when done

## 2015-11-20 NOTE — Telephone Encounter (Signed)
Returned pt's call. He was not available. Advised wife I had sent his rx to [place] per his request.

## 2015-11-24 ENCOUNTER — Other Ambulatory Visit: Payer: Self-pay | Admitting: Family Medicine

## 2015-12-06 ENCOUNTER — Ambulatory Visit: Payer: Self-pay | Admitting: Gastroenterology

## 2016-05-01 ENCOUNTER — Other Ambulatory Visit: Payer: Self-pay | Admitting: Family Medicine

## 2016-08-09 ENCOUNTER — Other Ambulatory Visit: Payer: Self-pay | Admitting: Family Medicine

## 2016-08-09 ENCOUNTER — Ambulatory Visit (INDEPENDENT_AMBULATORY_CARE_PROVIDER_SITE_OTHER): Payer: Commercial Managed Care - HMO

## 2016-08-09 ENCOUNTER — Other Ambulatory Visit: Payer: Commercial Managed Care - HMO

## 2016-08-09 VITALS — BP 134/70 | HR 87 | Temp 98.5°F | Ht 68.0 in | Wt 262.5 lb

## 2016-08-09 DIAGNOSIS — Z125 Encounter for screening for malignant neoplasm of prostate: Secondary | ICD-10-CM

## 2016-08-09 DIAGNOSIS — M1A9XX0 Chronic gout, unspecified, without tophus (tophi): Secondary | ICD-10-CM

## 2016-08-09 DIAGNOSIS — N183 Chronic kidney disease, stage 3 unspecified: Secondary | ICD-10-CM

## 2016-08-09 DIAGNOSIS — E559 Vitamin D deficiency, unspecified: Secondary | ICD-10-CM | POA: Diagnosis not present

## 2016-08-09 DIAGNOSIS — IMO0001 Reserved for inherently not codable concepts without codable children: Secondary | ICD-10-CM

## 2016-08-09 DIAGNOSIS — E118 Type 2 diabetes mellitus with unspecified complications: Secondary | ICD-10-CM

## 2016-08-09 DIAGNOSIS — I10 Essential (primary) hypertension: Secondary | ICD-10-CM

## 2016-08-09 DIAGNOSIS — E538 Deficiency of other specified B group vitamins: Secondary | ICD-10-CM

## 2016-08-09 DIAGNOSIS — Z23 Encounter for immunization: Secondary | ICD-10-CM | POA: Diagnosis not present

## 2016-08-09 DIAGNOSIS — Z0000 Encounter for general adult medical examination without abnormal findings: Secondary | ICD-10-CM

## 2016-08-09 DIAGNOSIS — E785 Hyperlipidemia, unspecified: Secondary | ICD-10-CM | POA: Diagnosis not present

## 2016-08-09 DIAGNOSIS — R7989 Other specified abnormal findings of blood chemistry: Secondary | ICD-10-CM

## 2016-08-09 LAB — VITAMIN D 25 HYDROXY (VIT D DEFICIENCY, FRACTURES): VITD: 74.73 ng/mL (ref 30.00–100.00)

## 2016-08-09 LAB — TSH: TSH: 2.22 u[IU]/mL (ref 0.35–4.50)

## 2016-08-09 LAB — CBC WITH DIFFERENTIAL/PLATELET
BASOS PCT: 0.5 % (ref 0.0–3.0)
Basophils Absolute: 0 10*3/uL (ref 0.0–0.1)
EOS ABS: 0.2 10*3/uL (ref 0.0–0.7)
EOS PCT: 4.7 % (ref 0.0–5.0)
HCT: 40 % (ref 39.0–52.0)
HEMOGLOBIN: 13.6 g/dL (ref 13.0–17.0)
LYMPHS ABS: 1.3 10*3/uL (ref 0.7–4.0)
Lymphocytes Relative: 25.2 % (ref 12.0–46.0)
MCHC: 34 g/dL (ref 30.0–36.0)
MCV: 95.4 fl (ref 78.0–100.0)
MONO ABS: 0.6 10*3/uL (ref 0.1–1.0)
Monocytes Relative: 12.1 % — ABNORMAL HIGH (ref 3.0–12.0)
NEUTROS ABS: 2.9 10*3/uL (ref 1.4–7.7)
NEUTROS PCT: 57.5 % (ref 43.0–77.0)
PLATELETS: 196 10*3/uL (ref 150.0–400.0)
RBC: 4.19 Mil/uL — ABNORMAL LOW (ref 4.22–5.81)
RDW: 14.2 % (ref 11.5–15.5)
WBC: 5.1 10*3/uL (ref 4.0–10.5)

## 2016-08-09 LAB — RENAL FUNCTION PANEL
Albumin: 4.4 g/dL (ref 3.5–5.2)
BUN: 28 mg/dL — AB (ref 6–23)
CALCIUM: 9.6 mg/dL (ref 8.4–10.5)
CO2: 29 meq/L (ref 19–32)
CREATININE: 1.59 mg/dL — AB (ref 0.40–1.50)
Chloride: 105 mEq/L (ref 96–112)
GFR: 45.81 mL/min — AB (ref >60.00)
GLUCOSE: 117 mg/dL — AB (ref 70–99)
Phosphorus: 2.9 mg/dL (ref 2.3–4.6)
Potassium: 4.5 mEq/L (ref 3.5–5.1)
Sodium: 141 mEq/L (ref 135–145)

## 2016-08-09 LAB — LIPID PANEL
CHOL/HDL RATIO: 4
Cholesterol: 135 mg/dL (ref 0–200)
HDL: 38 mg/dL — AB (ref >39.00)
LDL CALC: 58 mg/dL (ref 0–99)
NONHDL: 97.48
Triglycerides: 195 mg/dL — ABNORMAL HIGH (ref 0.0–149.0)
VLDL: 39 mg/dL (ref 0.0–40.0)

## 2016-08-09 LAB — HEMOGLOBIN A1C: Hgb A1c MFr Bld: 6.2 % (ref 4.6–6.5)

## 2016-08-09 LAB — URIC ACID: URIC ACID, SERUM: 7.8 mg/dL (ref 4.0–7.8)

## 2016-08-09 LAB — PSA, MEDICARE: PSA: 0.14 ng/mL (ref 0.10–4.00)

## 2016-08-09 LAB — VITAMIN B12: VITAMIN B 12: 245 pg/mL (ref 211–911)

## 2016-08-09 NOTE — Progress Notes (Signed)
PCP notes:   Health maintenance:  PCV13 - administered Foot exam - if necessary, will be completed at CPE Flu vaccine - postponed until CPE based on pt's last reaction Eye exam - pt needs to identify provider within insurance network A1C - completed  Abnormal screenings:   Hearing - failed   Patient concerns:   Pt has complaint of right knee pain. 4/10. Onset approx. 12 months ago. Frequency: constant.   Nurse concerns:  Pt declined flu vaccine at this time stating he has had muscular pain since last flu vaccine was administered in 2016. Pt has been asked to identify the type of flu vaccine received and share this info with PCP at CPE.   Next PCP appt:   08/13/16 @ 1500

## 2016-08-09 NOTE — Progress Notes (Signed)
Subjective:   Thomas Carlson is a 70 y.o. male who presents for Medicare Annual/Subsequent preventive examination.  Review of Systems:  N/A Cardiac Risk Factors include: advanced age (>104men, >41 women);hypertension;dyslipidemia;obesity (BMI >30kg/m2);male gender;diabetes mellitus     Objective:    Vitals: BP 134/70 (BP Location: Right Arm, Patient Position: Sitting, Cuff Size: Normal)   Pulse 87   Temp 98.5 F (36.9 C) (Oral)   Ht 5\' 8"  (1.727 m) Comment: no shoes  Wt 262 lb 8 oz (119.1 kg)   BMI 39.91 kg/m   Body mass index is 39.91 kg/m.  Tobacco History  Smoking Status  . Never Smoker  Smokeless Tobacco  . Never Used     Counseling given: No   Past Medical History:  Diagnosis Date  . CAD (coronary artery disease) 2005   s/p stent ([place])  . Cholelithiasis    by CT  . Chronic kidney disease, stage 3, mod decreased GFR   . Diabetes mellitus ([place]) 2010  . Diverticulosis    by CT  . DJD (degenerative joint disease)    knee  . Genital herpes   . Gout   . Heart murmur    followed by PCP  . HLD (hyperlipidemia)   . HTN (hypertension)   . Obesity   . Seizures ([place])    x1 - after craniotomy [id number])   Past Surgical History:  Procedure Laterality Date  . APPENDECTOMY  1958  . CLOSED REDUCTION CLAVICLE FRACTURE  1955  . COLONOSCOPY  02/2008   int hemorrhoids, diverticula (Dr. Allen Norris)  . COLONOSCOPY WITH PROPOFOL N/A 11/10/2015   Procedure: COLONOSCOPY WITH PROPOFOL;  Surgeon: [name], MD;  Location: [facility];  Service: Endoscopy;  Laterality: N/A;  diabetic - oral meds  . CORONARY ANGIOPLASTY WITH STENT PLACEMENT  2005   stent 2005  . HERNIA REPAIR  1956  . KNEE ARTHROSCOPY  2008   torn meniscus  . SUBDURAL HEMATOMA EVACUATION VIA CRANIOTOMY  1964   hit in helmet by baseball   Family History  Problem Relation Age of Onset  . Diabetes Father 13  . Coronary artery disease Sister     catheterizations  . COPD Brother   . Stroke Brother     . Hypertension Brother   . Cancer Neg Hx    History  Sexual Activity  . Sexual activity: No    Outpatient Encounter Prescriptions as of 08/09/2016  Medication Sig  . acyclovir (ZOVIRAX) 400 MG tablet TAKE 1 TABLET TWICE DAILY  . aspirin EC 81 MG tablet Take 81 mg by mouth daily.  Marland Kitchen atenolol-chlorthalidone (TENORETIC) 50-25 MG per tablet Take 1 tablet by mouth daily.  . Cholecalciferol (VITAMIN D3) 5000 units CAPS Take 1 capsule by mouth daily.  Marland Kitchen lovastatin (MEVACOR) 40 MG tablet Take 2 tablets (80 mg total) by mouth at bedtime.  . Multiple Vitamin (MULTIVITAMIN) tablet Take 1 tablet by mouth daily.  . Omega-3 Fatty Acids (FISH OIL) 1360 MG CAPS Take 1 capsule by mouth daily.   . quinapril (ACCUPRIL) 40 MG tablet Take 1 tablet (40 mg total) by mouth at bedtime. (Patient taking differently: Take 20 mg by mouth at bedtime. )  . indomethacin (INDOCIN) 50 MG capsule Take 50 mg by mouth daily as needed (for gout flare-up).   . [DISCONTINUED] budesonide (ENTOCORT EC) 3 MG 24 hr capsule Take 3 capsules (9 mg total) by mouth every morning.  . [DISCONTINUED] Cholecalciferol (VITAMIN D3) 3000 UNITS TABS Take 3,000 Units by mouth  daily.  . [DISCONTINUED] vitamin B-12 (CYANOCOBALAMIN) 1000 MCG tablet Take 1 tablet (1,000 mcg total) by mouth daily.   No facility-administered encounter medications on file as of 08/09/2016.     Activities of Daily Living In your present state of health, do you have any difficulty performing the following activities: 08/09/2016  Hearing? N  Vision? N  Difficulty concentrating or making decisions? N  Walking or climbing stairs? N  Dressing or bathing? N  Doing errands, shopping? N  Preparing Food and eating ? N  Using the Toilet? N  In the past six months, have you accidently leaked urine? N  Do you have problems with loss of bowel control? N  Managing your Medications? N  Managing your Finances? N  Housekeeping or managing your Housekeeping? N  Some recent  data might be hidden    Patient Care Team: Ria Bush, MD as PCP - General (Family Medicine)   Assessment:     Hearing Screening   125Hz  250Hz  500Hz  1000Hz  2000Hz  3000Hz  4000Hz  6000Hz  8000Hz   Right ear:   40 40 40  40    Left ear:   40 40 40  0      Visual Acuity Screening   Right eye Left eye Both eyes  Without correction:     With correction: 20/40 20/30 20/25     Exercise Activities and Dietary recommendations Current Exercise Habits: Home exercise routine, Type of exercise: walking, Time (Minutes): 30, Frequency (Times/Week): 2, Weekly Exercise (Minutes/Week): 60, Intensity: Mild, Exercise limited by: orthopedic condition(s) (knee pain)  Goals    . Increase water intake          Starting 08/09/2016, I will attempt to drink at least 6-8 glasses of water daily.       Fall Risk Fall Risk  08/09/2016 07/13/2015  Falls in the past year? No No   Depression Screen PHQ 2/9 Scores 08/09/2016 07/13/2015 07/10/2012  PHQ - 2 Score 0 0 0    Cognitive Testing MMSE - Mini Mental State Exam 08/09/2016  Orientation to time 5  Orientation to Place 5  Registration 3  Attention/ Calculation 0  Recall 3  Language- name 2 objects 0  Language- repeat 1  Language- follow 3 step command 3  Language- read & follow direction 0  Write a sentence 0  Copy design 0  Total score 20   PLEASE NOTE: A Mini-Cog screen was completed. Maximum score is 20. A value of 0 denotes this part of [place] MMSE was not completed or the patient failed this part of the Mini-Cog screening.   Mini-Cog Screening Orientation to Time - Max 5 pts Orientation to Place - Max 5 pts Registration - Max 3 pts Recall - Max 3 pts Language Repeat - Max 1 pts Language Follow 3 Step Command - Max 3 pts   Immunization History  Administered Date(s) Administered  . Influenza-Unspecified 07/18/2015  . Pneumococcal Polysaccharide-23 07/10/2012  . Td 10/28/2010  . Zoster 10/28/2009   Screening Tests Health  Maintenance  Topic Date Due  . INFLUENZA VACCINE  08/13/2016 (Originally 05/28/2016)  . FOOT EXAM  08/13/2016 (Originally 07/12/2016)  . OPHTHALMOLOGY EXAM  08/09/2017 (Originally 09/20/2015)  . DTaP/Tdap/Td (1 - Tdap) 06/16/2021 (Originally 10/29/2010)  . HEMOGLOBIN A1C  02/07/2017  . TETANUS/TDAP  06/16/2021  . COLONOSCOPY  11/09/2025  . ZOSTAVAX  Completed  . Hepatitis C Screening  Completed  . PNA vac Low Risk Adult  Completed      Plan:  I have personally reviewed and addressed the Medicare Annual Wellness questionnaire and have noted the following in the patient's chart:  A. Medical and social history B. Use of alcohol, tobacco or illicit drugs  C. Current medications and supplements D. Functional ability and status E.  Nutritional status F.  Physical activity G. Advance directives H. List of other physicians I.  Hospitalizations, surgeries, and ER visits in previous 12 months J.  [place] to include hearing, vision, cognitive, depression L. Referrals and appointments - none  In addition, I have reviewed and discussed with patient certain preventive protocols, quality metrics, and best practice recommendations. A written personalized care plan for preventive services as well as general preventive health recommendations were provided to patient.  See attached scanned questionnaire for additional information.   Signed,   Lindell Noe, MHA, BS, LPN Health Coach

## 2016-08-09 NOTE — Patient Instructions (Signed)
Thomas Carlson , Thank you for taking time to come for your Medicare Wellness Visit. I appreciate your ongoing commitment to your health goals. Please review the following plan we discussed and let me know if I can assist you in the future.   These are the goals we discussed: Goals    . Increase water intake          Starting 08/09/2016, I will attempt to drink at least 6-8 glasses of water daily.        This is a list of the screening recommended for you and due dates:  Health Maintenance  Topic Date Due  . Flu Shot  08/13/2016*  . Complete foot exam   08/13/2016*  . Eye exam for diabetics  08/09/2017*  . DTaP/Tdap/Td vaccine (1 - Tdap) 06/16/2021*  . Hemoglobin A1C  02/07/2017  . Tetanus Vaccine  06/16/2021  . Colon Cancer Screening  11/09/2025  . Shingles Vaccine  Completed  .  Hepatitis C: One time screening is recommended by Center for Disease Control  (CDC) for  adults born from 2 through 1966.   Completed  . Pneumonia vaccines  Completed  *Topic was postponed. The date shown is not the original due date.   Preventive Care for Adults  A healthy lifestyle and preventive care can promote health and wellness. Preventive health guidelines for adults include the following key practices.  . A routine yearly physical is a good way to check with your health care provider about your health and preventive screening. It is a chance to share any concerns and updates on your health and to receive a thorough exam.  . Visit your dentist for a routine exam and preventive care every 6 months. Brush your teeth twice a day and floss once a day. Good oral hygiene prevents tooth decay and gum disease.  . The frequency of eye exams is based on your age, health, family medical history, use  of contact lenses, and other factors. Follow your health care provider's ecommendations for frequency of eye exams.  . Eat a healthy diet. Foods like vegetables, fruits, whole grains, low-fat dairy products,  and lean protein foods contain the nutrients you need without too many calories. Decrease your intake of foods high in solid fats, added sugars, and salt. Eat the right amount of calories for you. Get information about a proper diet from your health care provider, if necessary.  . Regular physical exercise is one of the most important things you can do for your health. Most adults should get at least 150 minutes of moderate-intensity exercise (any activity that increases your heart rate and causes you to sweat) each week. In addition, most adults need muscle-strengthening exercises on 2 or more days a week.  Silver Sneakers may be a benefit available to you. To determine eligibility, you may visit the website: www.silversneakers.com or contact program at 770-002-9198 Mon-Fri between 8AM-8PM.   . Maintain a healthy weight. The body mass index (BMI) is a screening tool to identify possible weight problems. It provides an estimate of body fat based on height and weight. Your health care provider can find your BMI and can help you achieve or maintain a healthy weight.   For adults 20 years and older: ? A BMI below 18.5 is considered underweight. ? A BMI of 18.5 to 24.9 is normal. ? A BMI of 25 to 29.9 is considered overweight. ? A BMI of 30 and above is considered obese.   . Maintain normal blood  lipids and cholesterol levels by exercising and minimizing your intake of saturated fat. Eat a balanced diet with plenty of fruit and vegetables. Blood tests for lipids and cholesterol should begin at age 53 and be repeated every 5 years. If your lipid or cholesterol levels are high, you are over 50, or you are at high risk for heart disease, you may need your cholesterol levels checked more frequently. Ongoing high lipid and cholesterol levels should be treated with medicines if diet and exercise are not working.  . If you smoke, find out from your health care provider how to quit. If you do not use tobacco,  please do not start.  . If you choose to drink alcohol, please do not consume more than 2 drinks per day. One drink is considered to be 12 ounces (355 mL) of beer, 5 ounces (148 mL) of wine, or 1.5 ounces (44 mL) of liquor.  . If you are 35-74 years old, ask your health care provider if you should take aspirin to prevent strokes.  . Use sunscreen. Apply sunscreen liberally and repeatedly throughout the day. You should seek shade when your shadow is shorter than you. Protect yourself by wearing long sleeves, pants, a wide-brimmed hat, and sunglasses year round, whenever you are outdoors.  . Once a month, do a whole body skin exam, using a mirror to look at the skin on your back. Tell your health care provider of new moles, moles that have irregular borders, moles that are larger than a pencil eraser, or moles that have changed in shape or color.

## 2016-08-09 NOTE — Progress Notes (Signed)
Pre visit review using our clinic review tool, if applicable. No additional management support is needed unless otherwise documented below in the visit note. 

## 2016-08-09 NOTE — Progress Notes (Signed)
I reviewed health advisor's note, was available for consultation, and agree with documentation and plan.  

## 2016-08-13 ENCOUNTER — Ambulatory Visit (INDEPENDENT_AMBULATORY_CARE_PROVIDER_SITE_OTHER): Payer: Commercial Managed Care - HMO | Admitting: Family Medicine

## 2016-08-13 ENCOUNTER — Encounter: Payer: Self-pay | Admitting: Family Medicine

## 2016-08-13 VITALS — BP 132/72 | HR 90 | Temp 98.0°F | Wt 261.0 lb

## 2016-08-13 DIAGNOSIS — R011 Cardiac murmur, unspecified: Secondary | ICD-10-CM

## 2016-08-13 DIAGNOSIS — M25561 Pain in right knee: Secondary | ICD-10-CM | POA: Insufficient documentation

## 2016-08-13 DIAGNOSIS — M1A9XX0 Chronic gout, unspecified, without tophus (tophi): Secondary | ICD-10-CM

## 2016-08-13 DIAGNOSIS — Z1283 Encounter for screening for malignant neoplasm of skin: Secondary | ICD-10-CM

## 2016-08-13 DIAGNOSIS — E118 Type 2 diabetes mellitus with unspecified complications: Secondary | ICD-10-CM | POA: Diagnosis not present

## 2016-08-13 DIAGNOSIS — I358 Other nonrheumatic aortic valve disorders: Secondary | ICD-10-CM

## 2016-08-13 DIAGNOSIS — N183 Chronic kidney disease, stage 3 unspecified: Secondary | ICD-10-CM

## 2016-08-13 DIAGNOSIS — I10 Essential (primary) hypertension: Secondary | ICD-10-CM | POA: Diagnosis not present

## 2016-08-13 DIAGNOSIS — G8929 Other chronic pain: Secondary | ICD-10-CM

## 2016-08-13 DIAGNOSIS — Z7189 Other specified counseling: Secondary | ICD-10-CM

## 2016-08-13 DIAGNOSIS — Z0000 Encounter for general adult medical examination without abnormal findings: Secondary | ICD-10-CM

## 2016-08-13 DIAGNOSIS — H6122 Impacted cerumen, left ear: Secondary | ICD-10-CM | POA: Insufficient documentation

## 2016-08-13 DIAGNOSIS — K52832 Lymphocytic colitis: Secondary | ICD-10-CM | POA: Insufficient documentation

## 2016-08-13 DIAGNOSIS — E669 Obesity, unspecified: Secondary | ICD-10-CM

## 2016-08-13 DIAGNOSIS — E782 Mixed hyperlipidemia: Secondary | ICD-10-CM

## 2016-08-13 DIAGNOSIS — E559 Vitamin D deficiency, unspecified: Secondary | ICD-10-CM

## 2016-08-13 DIAGNOSIS — E538 Deficiency of other specified B group vitamins: Secondary | ICD-10-CM

## 2016-08-13 DIAGNOSIS — IMO0001 Reserved for inherently not codable concepts without codable children: Secondary | ICD-10-CM

## 2016-08-13 HISTORY — DX: Other nonrheumatic aortic valve disorders: I35.8

## 2016-08-13 MED ORDER — METFORMIN HCL 500 MG PO TABS: 500.0000 mg | ORAL_TABLET | Freq: Every day | ORAL | 3 refills | Status: DC

## 2016-08-13 MED ORDER — ATENOLOL-CHLORTHALIDONE 50-25 MG PO TABS: 1.0000 | ORAL_TABLET | Freq: Every day | ORAL | 3 refills | Status: DC

## 2016-08-13 MED ORDER — ACYCLOVIR 400 MG PO TABS: 400.0000 mg | ORAL_TABLET | Freq: Two times a day (BID) | ORAL | 3 refills | Status: DC

## 2016-08-13 MED ORDER — QUINAPRIL HCL 20 MG PO TABS: 20.0000 mg | ORAL_TABLET | Freq: Every day | ORAL | 3 refills | Status: DC

## 2016-08-13 MED ORDER — LOVASTATIN 40 MG PO TABS: 80.0000 mg | ORAL_TABLET | Freq: Every day | ORAL | 3 refills | Status: DC

## 2016-08-13 NOTE — Assessment & Plan Note (Signed)
Chronic, stable. Continue lovastatin 80mg  daily. LDL at goal. Reviewed diet changes to improve triglyceride levels.

## 2016-08-13 NOTE — Assessment & Plan Note (Signed)
Chronic, stable. Will decrease metformin to 500mg  once daily.

## 2016-08-13 NOTE — Assessment & Plan Note (Signed)
Of unknown etiology, by biopsy on colonoscopy - resolved on its own.

## 2016-08-13 NOTE — Assessment & Plan Note (Signed)
UA elevated - no recent flares. Continue to monitor.

## 2016-08-13 NOTE — Progress Notes (Signed)
BP 132/72   Pulse 90   Temp 98 F (36.7 C) (Oral)   Wt 261 lb (118.4 kg)   BMI 39.68 kg/m    CC: CPE Subjective:    Patient ID: Thomas Carlson, male    DOB: [birth year], 70 y.o.   MRN: [id number]  HPI: Thomas Carlson is a 70 y.o. male presenting on 08/13/2016 for Annual Exam   Saw Katha Cabal for medicare wellness visit, note reviewed. Failed hearing screen out of left ear. [employer].   Received flu shot last year in R arm - since then R upper arm painful.   Recurrent R knee pain over last 3-4 months - points to anterior kneecap. S/p arthroscopy with torn meniscus [name] Heck at [place]). Limiting walking.  Preventative: COLONOSCOPY WITH PROPOFOL 11/10/2015; diverticulosis, biopsy showed microscopic colitis [name], MD)  Prostate - requests screening QOyr, due next year  Flu shot at [facility] last year with arm soreness Pneumovax 06/2012, prevnar 07/2016 Td - 2012  Shingle shot 2011  Advanced directive - has living will at home - will check on this. HCPOA - wife.  Seat belt use discuss  Sunscreen use discussed, no changing moles on skin. Requests referral to derm.  Non smoker Alcohol - none  Caffeine: 2 cans of diet soda/day  Lives with wife. 1 dog at home. 2 grown children  Occupation: Advertising copywriter at [employer], current [employer] 2015 Edu: Master's degree  Activity: walking (fit bit), limited by R foot pain Diet: some water, lots of diet sodas, fruits/vegetables occasionally, cutting back on carbs  Relevant past medical, surgical, family and social history reviewed and updated as indicated. Interim medical history since our last visit reviewed. Allergies and medications reviewed and updated. Current Outpatient Prescriptions on File Prior to Visit  Medication Sig  . aspirin EC 81 MG tablet Take 81 mg by mouth daily.  . indomethacin (INDOCIN) 50 MG capsule Take 50 mg by mouth daily as needed (for gout flare-up).   . Multiple Vitamin  (MULTIVITAMIN) tablet Take 1 tablet by mouth daily.  . Omega-3 Fatty Acids (FISH OIL) 1360 MG CAPS Take 1 capsule by mouth daily.    No current facility-administered medications on file prior to visit.     Review of Systems  Constitutional: Negative for activity change, appetite change, chills, fatigue, fever and unexpected weight change.  HENT: Negative for hearing loss.   Eyes: Negative for visual disturbance.  Respiratory: Negative for cough, chest tightness, shortness of breath and wheezing.   Cardiovascular: Negative for chest pain, palpitations and leg swelling.  Gastrointestinal: Negative for abdominal distention, abdominal pain, blood in stool, constipation, diarrhea, nausea and vomiting.  Genitourinary: Negative for difficulty urinating and hematuria.  Musculoskeletal: Negative for arthralgias, myalgias and neck pain.  Skin: Negative for rash.  Neurological: Negative for dizziness, seizures, syncope and headaches.  Hematological: Negative for adenopathy. Does not bruise/bleed easily.  Psychiatric/Behavioral: Negative for dysphoric mood. The patient is not nervous/anxious.    Per HPI unless specifically indicated in ROS section     Objective:    BP 132/72   Pulse 90   Temp 98 F (36.7 C) (Oral)   Wt 261 lb (118.4 kg)   BMI 39.68 kg/m   Wt Readings from Last 3 Encounters:  08/13/16 261 lb (118.4 kg)  08/09/16 262 lb 8 oz (119.1 kg)  11/17/15 252 lb (114.3 kg)    Physical Exam  Constitutional: He is oriented to person, place, and time. He appears well-developed  and well-nourished. No distress.  HENT:  Head: Normocephalic and atraumatic.  Right Ear: Hearing, tympanic membrane, external ear and ear canal normal.  Left Ear: Hearing, tympanic membrane, external ear and ear canal normal.  Nose: Nose normal.  Mouth/Throat: Uvula is midline, oropharynx is clear and moist and mucous membranes are normal. No oropharyngeal exudate, posterior oropharyngeal edema or posterior  oropharyngeal erythema.  Eyes: Conjunctivae and EOM are normal. Pupils are equal, round, and reactive to light. No scleral icterus.  Neck: Normal range of motion. Neck supple. Carotid bruit is not present. No thyromegaly present.  Cardiovascular: Normal rate, regular rhythm and intact distal pulses.   Murmur (4/6 SEM at LUSB) heard. Pulses:      Radial pulses are 2+ on the right side, and 2+ on the left side.  Pulmonary/Chest: Effort normal and breath sounds normal. No respiratory distress. He has no wheezes. He has no rales.  Abdominal: Soft. Bowel sounds are normal. He exhibits no distension and no mass. There is no tenderness. There is no rebound and no guarding.  Musculoskeletal: Normal range of motion. He exhibits no edema.  L knee FROM R knee tender to palpation bilateral joint lines but FROM without crepitus, no polipteal fullness  Lymphadenopathy:    He has no cervical adenopathy.  Neurological: He is alert and oriented to person, place, and time.  CN grossly intact, station and gait intact  Skin: Skin is warm and dry. No rash noted.  Verrucous growth R superior anterior knee  Psychiatric: He has a normal mood and affect. His behavior is normal. Judgment and thought content normal.  Nursing note and vitals reviewed.  Results for orders placed or performed in visit on 08/09/16  Vitamin B12  Result Value Ref Range   Vitamin B-12 245 211 - 911 pg/mL  VITAMIN D 25 Hydroxy (Vit-D Deficiency, Fractures)  Result Value Ref Range   VITD 74.73 30.00 - 100.00 ng/mL  Lipid panel  Result Value Ref Range   Cholesterol 135 0 - 200 mg/dL   Triglycerides 195.0 (H) 0.0 - 149.0 mg/dL   HDL 38.00 (L) >39.00 mg/dL   VLDL 39.0 0.0 - 40.0 mg/dL   LDL Cholesterol 58 0 - 99 mg/dL   Total CHOL/HDL Ratio 4    NonHDL 97.48   Renal function panel  Result Value Ref Range   Sodium 141 135 - 145 mEq/L   Potassium 4.5 3.5 - 5.1 mEq/L   Chloride 105 96 - 112 mEq/L   CO2 29 19 - 32 mEq/L   Calcium  9.6 8.4 - 10.5 mg/dL   Albumin 4.4 3.5 - 5.2 g/dL   BUN 28 (H) 6 - 23 mg/dL   Creatinine, Ser 1.59 (H) 0.40 - 1.50 mg/dL   Glucose, Bld 117 (H) 70 - 99 mg/dL   Phosphorus 2.9 2.3 - 4.6 mg/dL   GFR 45.81 (L) >60.00 mL/min  TSH  Result Value Ref Range   TSH 2.22 0.35 - 4.50 uIU/mL  Hemoglobin A1c  Result Value Ref Range   Hgb A1c MFr Bld 6.2 4.6 - 6.5 %  CBC with Differential/Platelet  Result Value Ref Range   WBC 5.1 4.0 - 10.5 K/uL   RBC 4.19 (L) 4.22 - 5.81 Mil/uL   Hemoglobin 13.6 13.0 - 17.0 g/dL   HCT 40.0 39.0 - 52.0 %   MCV 95.4 78.0 - 100.0 fl   MCHC 34.0 30.0 - 36.0 g/dL   RDW 14.2 11.5 - 15.5 %   Platelets 196.0 150.0 - 400.0  K/uL   Neutrophils Relative % 57.5 43.0 - 77.0 %   Lymphocytes Relative 25.2 12.0 - 46.0 %   Monocytes Relative 12.1 (H) 3.0 - 12.0 %   Eosinophils Relative 4.7 0.0 - 5.0 %   Basophils Relative 0.5 0.0 - 3.0 %   Neutro Abs 2.9 1.4 - 7.7 K/uL   Lymphs Abs 1.3 0.7 - 4.0 K/uL   Monocytes Absolute 0.6 0.1 - 1.0 K/uL   Eosinophils Absolute 0.2 0.0 - 0.7 K/uL   Basophils Absolute 0.0 0.0 - 0.1 K/uL  PSA, Medicare  Result Value Ref Range   PSA 0.14 0.10 - 4.00 ng/ml  Uric acid  Result Value Ref Range   Uric Acid, Serum 7.8 4.0 - 7.8 mg/dL      Assessment & Plan:   Problem List Items Addressed This Visit    Advanced care planning/counseling discussion    Advanced directive - has living will at home - will check on this. HCPOA - wife.       Chronic kidney disease, stage 3, mod decreased GFR    Encouraged good hydration status. Aware to avoid NSAIDs and other nephrotoxins.      Diabetes mellitus type 2, controlled, with complications (HCC)    Chronic, stable. Will decrease metformin to 500mg  once daily.       Relevant Medications   atenolol-chlorthalidone (TENORETIC) 50-25 MG tablet   lovastatin (MEVACOR) 40 MG tablet   quinapril (ACCUPRIL) 20 MG tablet   metFORMIN (GLUCOPHAGE) 500 MG tablet   Other Relevant Orders   Ambulatory  referral to Ophthalmology   Gout    UA elevated - no recent flares. Continue to monitor.       Healthcare maintenance - Primary    Preventative protocols reviewed and updated unless pt declined. Discussed healthy diet and lifestyle.       Hearing loss of left ear due to cerumen impaction    Successful L ear irrigation performed today      HLD (hyperlipidemia)    Chronic, stable. Continue lovastatin 80mg  daily. LDL at goal. Reviewed diet changes to improve triglyceride levels.       Relevant Medications   atenolol-chlorthalidone (TENORETIC) 50-25 MG tablet   lovastatin (MEVACOR) 40 MG tablet   quinapril (ACCUPRIL) 20 MG tablet   HTN (hypertension)    Chronic, stable on lower quinapril dose. Refilled meds.       Relevant Medications   atenolol-chlorthalidone (TENORETIC) 50-25 MG tablet   lovastatin (MEVACOR) 40 MG tablet   quinapril (ACCUPRIL) 20 MG tablet   Other Relevant Orders   EKG 12-Lead (Completed)   Low vitamin B12 level    rec start 525mcg daily.       Lymphocytic colitis    Of unknown etiology, by biopsy on colonoscopy - resolved on its own.       Obesity, Class II, BMI 35-39.9, with comorbidity    Discussed healthy diet and lifestyle changes to affect sustainable weight loss.      Relevant Medications   metFORMIN (GLUCOPHAGE) 500 MG tablet   Right knee pain    Recurrent knee pain in h/o meniscal tear s/p arthroscopy by Dr [name] Heck at [place]. Will refer back to ortho per pt preference.       Systolic murmur    Progressive. Check EKG today. Low threshold to obtain echocardiogram. Pt agrees with plan. EKG NSR rate 80s, normal axis, intervals, no acute ST/T changes.      Relevant Orders   EKG 12-Lead (Completed)  ECHOCARDIOGRAM COMPLETE   Vitamin D deficiency    Vit D stable - will decrease from 5000 IU to 2000 IU daily.        Other Visit Diagnoses    Screening exam for skin cancer       Relevant Orders   Ambulatory referral to  Dermatology       Follow up plan: No Follow-up on file.  Ria Bush, MD

## 2016-08-13 NOTE — Assessment & Plan Note (Signed)
Vit D stable - will decrease from 5000 IU to 2000 IU daily.

## 2016-08-13 NOTE — Assessment & Plan Note (Signed)
Preventative protocols reviewed and updated unless pt declined. Discussed healthy diet and lifestyle.  

## 2016-08-13 NOTE — Assessment & Plan Note (Addendum)
Successful L ear irrigation performed today

## 2016-08-13 NOTE — Progress Notes (Signed)
Pre visit review using our clinic review tool, if applicable. No additional management support is needed unless otherwise documented below in the visit note. 

## 2016-08-13 NOTE — Assessment & Plan Note (Signed)
Advanced directive - has living will at home - will check on this. HCPOA - wife.

## 2016-08-13 NOTE — Assessment & Plan Note (Signed)
Recurrent knee pain in h/o meniscal tear s/p arthroscopy by Dr [name] Heck at [place]. Will refer back to ortho per pt preference.

## 2016-08-13 NOTE — Patient Instructions (Addendum)
We will refer you to eye and skin doctors. We will refer you to orthopedist. Check on advanced directive at home.  More water, more fatty fish Decrease vitamin D to 2000 units. Start 500mg  vitamin C and 580mcg vitamin B12.  EKG today - we may order ultrasound depending on EKG results.  Left ear irrigation today.   Health Maintenance, Male A healthy lifestyle and preventative care can promote health and wellness.  Maintain regular health, dental, and eye exams.  Eat a healthy diet. Foods like vegetables, fruits, whole grains, low-fat dairy products, and lean protein foods contain the nutrients you need and are low in calories. Decrease your intake of foods high in solid fats, added sugars, and salt. Get information about a proper diet from your health care provider, if necessary.  Regular physical exercise is one of the most important things you can do for your health. Most adults should get at least 150 minutes of moderate-intensity exercise (any activity that increases your heart rate and causes you to sweat) each week. In addition, most adults need muscle-strengthening exercises on 2 or more days a week.   Maintain a healthy weight. The body mass index (BMI) is a screening tool to identify possible weight problems. It provides an estimate of body fat based on height and weight. Your health care provider can find your BMI and can help you achieve or maintain a healthy weight. For males 20 years and older:  A BMI below 18.5 is considered underweight.  A BMI of 18.5 to 24.9 is normal.  A BMI of 25 to 29.9 is considered overweight.  A BMI of 30 and above is considered obese.  Maintain normal blood lipids and cholesterol by exercising and minimizing your intake of saturated fat. Eat a balanced diet with plenty of fruits and vegetables. Blood tests for lipids and cholesterol should begin at age 31 and be repeated every 5 years. If your lipid or cholesterol levels are high, you are over age  67, or you are at high risk for heart disease, you may need your cholesterol levels checked more frequently.Ongoing high lipid and cholesterol levels should be treated with medicines if diet and exercise are not working.  If you smoke, find out from your health care provider how to quit. If you do not use tobacco, do not start.  Lung cancer screening is recommended for adults aged 27-80 years who are at high risk for developing lung cancer because of a history of smoking. A yearly low-dose CT scan of the lungs is recommended for people who have at least a 30-pack-year history of smoking and are current smokers or have quit within the past 15 years. A pack year of smoking is smoking an average of 1 pack of cigarettes a day for 1 year (for example, a 30-pack-year history of smoking could mean smoking 1 pack a day for 30 years or 2 packs a day for 15 years). Yearly screening should continue until the smoker has stopped smoking for at least 15 years. Yearly screening should be stopped for people who develop a health problem that would prevent them from having lung cancer treatment.  If you choose to drink alcohol, do not have more than 2 drinks per day. One drink is considered to be 12 oz (360 mL) of beer, 5 oz (150 mL) of wine, or 1.5 oz (45 mL) of liquor.  Avoid the use of street drugs. Do not share needles with anyone. Ask for help if you need support  or instructions about stopping the use of drugs.  High blood pressure causes heart disease and increases the risk of stroke. High blood pressure is more likely to develop in:  People who have blood pressure in the end of the normal range (100-139/85-89 mm Hg).  People who are overweight or obese.  People who are African American.  If you are 46-100 years of age, have your blood pressure checked every 3-5 years. If you are 40 years of age or older, have your blood pressure checked every year. You should have your blood pressure measured twice--once when  you are at a hospital or clinic, and once when you are not at a hospital or clinic. Record the average of the two measurements. To check your blood pressure when you are not at a hospital or clinic, you can use:  An automated blood pressure machine at a pharmacy.  A home blood pressure monitor.  If you are 82-34 years old, ask your health care provider if you should take aspirin to prevent heart disease.  Diabetes screening involves taking a blood sample to check your fasting blood sugar level. This should be done once every 3 years after age 24 if you are at a normal weight and without risk factors for diabetes. Testing should be considered at a younger age or be carried out more frequently if you are overweight and have at least 1 risk factor for diabetes.  Colorectal cancer can be detected and often prevented. Most routine colorectal cancer screening begins at the age of 75 and continues through age 20. However, your health care provider may recommend screening at an earlier age if you have risk factors for colon cancer. On a yearly basis, your health care provider may provide home test kits to check for hidden blood in the stool. A small camera at the end of a tube may be used to directly examine the colon (sigmoidoscopy or colonoscopy) to detect the earliest forms of colorectal cancer. Talk to your health care provider about this at age 21 when routine screening begins. A direct exam of the colon should be repeated every 5-10 years through age 40, unless early forms of precancerous polyps or small growths are found.  People who are at an increased risk for hepatitis B should be screened for this virus. You are considered at high risk for hepatitis B if:  You were born in a country where hepatitis B occurs often. Talk with your health care provider about which countries are considered high risk.  Your parents were born in a high-risk country and you have not received a shot to protect against  hepatitis B (hepatitis B vaccine).  You have HIV or AIDS.  You use needles to inject street drugs.  You live with, or have sex with, someone who has hepatitis B.  You are a man who has sex with other men (MSM).  You get hemodialysis treatment.  You take certain medicines for conditions like cancer, organ transplantation, and autoimmune conditions.  Hepatitis C blood testing is recommended for all people born from [birth year] through 1966 and any individual with known risk factors for hepatitis C.  Healthy men should no longer receive prostate-specific antigen (PSA) blood tests as part of routine cancer screening. Talk to your health care provider about prostate cancer screening.  Testicular cancer screening is not recommended for adolescents or adult males who have no symptoms. Screening includes self-exam, a health care provider exam, and other screening tests. Consult with your health  care provider about any symptoms you have or any concerns you have about testicular cancer.  Practice safe sex. Use condoms and avoid high-risk sexual practices to reduce the spread of sexually transmitted infections (STIs).  You should be screened for STIs, including gonorrhea and chlamydia if:  You are sexually active and are younger than 24 years.  You are older than 24 years, and your health care provider tells you that you are at risk for this type of infection.  Your sexual activity has changed since you were last screened, and you are at an increased risk for chlamydia or gonorrhea. Ask your health care provider if you are at risk.  If you are at risk of being infected with HIV, it is recommended that you take a prescription medicine daily to prevent HIV infection. This is called pre-exposure prophylaxis (PrEP). You are considered at risk if:  You are a man who has sex with other men (MSM).  You are a heterosexual man who is sexually active with multiple partners.  You take drugs by  injection.  You are sexually active with a partner who has HIV.  Talk with your health care provider about whether you are at high risk of being infected with HIV. If you choose to begin PrEP, you should first be tested for HIV. You should then be tested every 3 months for as long as you are taking PrEP.  Use sunscreen. Apply sunscreen liberally and repeatedly throughout the day. You should seek shade when your shadow is shorter than you. Protect yourself by wearing long sleeves, pants, a wide-brimmed hat, and sunglasses year round whenever you are outdoors.  Tell your health care provider of new moles or changes in moles, especially if there is a change in shape or color. Also, tell your health care provider if a mole is larger than the size of a pencil eraser.  A one-time screening for abdominal aortic aneurysm (AAA) and surgical repair of large AAAs by ultrasound is recommended for men aged 58-75 years who are current or former smokers.  Stay current with your vaccines (immunizations).   This information is not intended to replace advice given to you by your health care provider. Make sure you discuss any questions you have with your health care provider.   Document Released: 04/11/2008 Document Revised: 11/04/2014 Document Reviewed: 03/11/2011 Elsevier Interactive Patient Education [employer].

## 2016-08-13 NOTE — Assessment & Plan Note (Signed)
Encouraged good hydration status. Aware to avoid NSAIDs and other nephrotoxins.

## 2016-08-13 NOTE — Assessment & Plan Note (Signed)
Discussed healthy diet and lifestyle changes to affect sustainable weight loss  

## 2016-08-13 NOTE — Assessment & Plan Note (Signed)
rec start 56mcg daily.

## 2016-08-13 NOTE — Assessment & Plan Note (Addendum)
Progressive. Check EKG today. Low threshold to obtain echocardiogram. Pt agrees with plan. EKG NSR rate 80s, normal axis, intervals, no acute ST/T changes.

## 2016-08-13 NOTE — Assessment & Plan Note (Signed)
Chronic, stable on lower quinapril dose. Refilled meds.

## 2016-08-19 ENCOUNTER — Telehealth: Payer: Self-pay

## 2016-08-19 NOTE — Telephone Encounter (Signed)
Pt left v/m; pt seen for annual on 08/13/16 and at that time UA was elevated. Pt request generic med for gout to [facility]Please advise.

## 2016-08-20 MED ORDER — COLCHICINE 0.6 MG PO TABS: 0.6000 mg | ORAL_TABLET | Freq: Every day | ORAL | 1 refills | Status: DC | PRN

## 2016-08-20 MED ORDER — INDOMETHACIN 50 MG PO CAPS: 50.0000 mg | ORAL_CAPSULE | Freq: Every day | ORAL | 0 refills | Status: DC | PRN

## 2016-08-20 NOTE — Telephone Encounter (Signed)
Patient notified and verbalized understanding. 

## 2016-08-20 NOTE — Telephone Encounter (Signed)
He hasn't had any recent gout flares so I don't think we need to start daily gout medicine but I willl send in refill of indocin to take PRN.  If more frequent gout flares, would consider daily uric acid lowering medicine.

## 2016-09-03 DIAGNOSIS — E119 Type 2 diabetes mellitus without complications: Secondary | ICD-10-CM | POA: Diagnosis not present

## 2016-09-03 LAB — HM DIABETES EYE EXAM

## 2016-09-04 DIAGNOSIS — D235 Other benign neoplasm of skin of trunk: Secondary | ICD-10-CM | POA: Diagnosis not present

## 2016-09-04 DIAGNOSIS — L814 Other melanin hyperpigmentation: Secondary | ICD-10-CM | POA: Diagnosis not present

## 2016-09-04 DIAGNOSIS — L821 Other seborrheic keratosis: Secondary | ICD-10-CM | POA: Diagnosis not present

## 2016-09-05 ENCOUNTER — Ambulatory Visit (INDEPENDENT_AMBULATORY_CARE_PROVIDER_SITE_OTHER): Payer: Commercial Managed Care - HMO

## 2016-09-05 ENCOUNTER — Other Ambulatory Visit: Payer: Self-pay

## 2016-09-05 DIAGNOSIS — R011 Cardiac murmur, unspecified: Secondary | ICD-10-CM | POA: Diagnosis not present

## 2016-09-07 ENCOUNTER — Encounter: Payer: Self-pay | Admitting: Family Medicine

## 2016-09-12 ENCOUNTER — Encounter: Payer: Self-pay | Admitting: *Deleted

## 2016-11-05 DIAGNOSIS — D485 Neoplasm of uncertain behavior of skin: Secondary | ICD-10-CM | POA: Diagnosis not present

## 2016-11-05 DIAGNOSIS — L820 Inflamed seborrheic keratosis: Secondary | ICD-10-CM | POA: Diagnosis not present

## 2017-04-28 ENCOUNTER — Ambulatory Visit (INDEPENDENT_AMBULATORY_CARE_PROVIDER_SITE_OTHER): Payer: Medicare HMO | Admitting: Family Medicine

## 2017-04-28 ENCOUNTER — Encounter: Payer: Self-pay | Admitting: Family Medicine

## 2017-04-28 VITALS — BP 104/60 | HR 68 | Temp 97.9°F | Wt 254.0 lb

## 2017-04-28 DIAGNOSIS — K52832 Lymphocytic colitis: Secondary | ICD-10-CM

## 2017-04-28 DIAGNOSIS — R197 Diarrhea, unspecified: Secondary | ICD-10-CM | POA: Diagnosis not present

## 2017-04-28 LAB — COMPREHENSIVE METABOLIC PANEL
ALBUMIN: 4.2 g/dL (ref 3.5–5.2)
ALT: 21 U/L (ref 0–53)
AST: 21 U/L (ref 0–37)
Alkaline Phosphatase: 55 U/L (ref 39–117)
BUN: 24 mg/dL — ABNORMAL HIGH (ref 6–23)
CALCIUM: 9.9 mg/dL (ref 8.4–10.5)
CHLORIDE: 101 meq/L (ref 96–112)
CO2: 29 mEq/L (ref 19–32)
Creatinine, Ser: 1.5 mg/dL (ref 0.40–1.50)
GFR: 48.9 mL/min — ABNORMAL LOW (ref >60.00)
Glucose, Bld: 123 mg/dL — ABNORMAL HIGH (ref 70–99)
POTASSIUM: 3.9 meq/L (ref 3.5–5.1)
Sodium: 138 mEq/L (ref 135–145)
Total Bilirubin: 0.4 mg/dL (ref 0.2–1.2)
Total Protein: 7 g/dL (ref 6.0–8.3)

## 2017-04-28 LAB — CBC WITH DIFFERENTIAL/PLATELET
BASOS PCT: 0.8 % (ref 0.0–3.0)
Basophils Absolute: 0 10*3/uL (ref 0.0–0.1)
EOS ABS: 0.2 10*3/uL (ref 0.0–0.7)
EOS PCT: 3.1 % (ref 0.0–5.0)
HEMATOCRIT: 41 % (ref 39.0–52.0)
HEMOGLOBIN: 14.2 g/dL (ref 13.0–17.0)
LYMPHS PCT: 23.3 % (ref 12.0–46.0)
Lymphs Abs: 1.4 10*3/uL (ref 0.7–4.0)
MCHC: 34.5 g/dL (ref 30.0–36.0)
MCV: 95.2 fl (ref 78.0–100.0)
Monocytes Absolute: 0.5 10*3/uL (ref 0.1–1.0)
Monocytes Relative: 9.1 % (ref 3.0–12.0)
Neutro Abs: 3.8 10*3/uL (ref 1.4–7.7)
Neutrophils Relative %: 63.7 % (ref 43.0–77.0)
Platelets: 253 10*3/uL (ref 150.0–400.0)
RBC: 4.31 Mil/uL (ref 4.22–5.81)
RDW: 14.3 % (ref 11.5–15.5)
WBC: 5.9 10*3/uL (ref 4.0–10.5)

## 2017-04-28 MED ORDER — BUDESONIDE 3 MG PO CPEP: 9.0000 mg | ORAL_CAPSULE | Freq: Every day | ORAL | 1 refills | Status: DC

## 2017-04-28 NOTE — Patient Instructions (Addendum)
I think this is flare of microscopic colitis. Treat with budesonide capsules. If too expensive, let me know for different treatment option.  Labs today Let us know if fever >101 or worsening symptoms   Colitis Colitis is inflammation of the colon. Colitis may last a short time (acute) or it may last a long time (chronic). What are the causes? This condition may be caused by:  Viruses.  Bacteria.  Reactions to medicine.  Certain autoimmune diseases, such as Crohn disease or ulcerative colitis.  What are the signs or symptoms? Symptoms of this condition include:  Diarrhea.  Passing bloody or tarry stool.  Pain.  Fever.  Vomiting.  Tiredness (fatigue).  Weight loss.  Bloating.  Sudden increase in abdominal pain.  Having fewer bowel movements than usual.  How is this diagnosed? This condition is diagnosed with a stool test or a blood test. You may also have other tests, including X-rays, a CT scan, or a colonoscopy. How is this treated? Treatment may include:  Resting the bowel. This involves not eating or drinking for a period of time.  Fluids that are given through an IV tube.  Medicine for pain and diarrhea.  Antibiotic medicines.  Cortisone medicines.  Surgery.  Follow these instructions at home: Eating and drinking  Follow instructions from your health care provider about eating or drinking restrictions.  Drink enough fluid to keep your urine clear or pale yellow.  Work with a dietitian to determine which foods cause your condition to flare up.  Avoid foods that cause flare-ups.  Eat a well-balanced diet. Medicines  Take over-the-counter and prescription medicines only as told by your health care provider.  If you were prescribed an antibiotic medicine, take it as told by your health care provider. Do not stop taking the antibiotic even if you start to feel better. General instructions  Keep all follow-up visits as told by your health care  provider. This is important. Contact a health care provider if:  Your symptoms do not go away.  You develop new symptoms. Get help right away if:  You have a fever that does not go away with treatment.  You develop chills.  You have extreme weakness, fainting, or dehydration.  You have repeated vomiting.  You develop severe pain in your abdomen.  You pass bloody or tarry stool. This information is not intended to replace advice given to you by your health care provider. Make sure you discuss any questions you have with your health care provider. Document Released: 11/21/2004 Document Revised: 03/21/2016 Document Reviewed: 02/06/2015 Elsevier Interactive Patient Education  [employer].

## 2017-04-28 NOTE — Assessment & Plan Note (Addendum)
Anticipate lymphocytic colitis flare in h/o same 09/2015 that resolved on its own. Flare ongoing several weeks, no improvement despite regular imodium use. Will Rx entocort 9mg  x55mo then taper over 4 wks. Will price out and if unaffordable, will trial cholestyramine 4g QID + imodium. Discussed steroid hyperglycemia and need to monitor sugars while on budesonide.  Unclear cause - rare indocin use. ?aspirin use vs lovastatin.  Check CBC, CMP today r/o infectious cause, r/o lyte imbalance in setting of prolonged diarrhea.  If not improving as expected, low threshold to return to GI Dr Allen Norris. Pt agrees with plan.

## 2017-04-28 NOTE — Progress Notes (Signed)
BP 104/60 (BP Location: Left Arm, Patient Position: Sitting, Cuff Size: Large)   Pulse 68   Temp 97.9 F (36.6 C) (Oral)   Wt 254 lb (115.2 kg)   SpO2 94%   BMI 38.62 kg/m    CC: diarrhea Subjective:    Patient ID: [name], male    DOB: Apr 21, 1946, 71 y.o.   MRN: [id number]  HPI: PRATHER FAILLA is a 71 y.o. male presenting on 04/28/2017 for Diarrhea (Going on for a few weeks. Had the same issue a few years ago. All tests were negative then.)   Several week history of loose and watery stool associated with abdominal cramping. Going 4-5 times a day. No blood in stool, no yellow greasy stools. Denies diet changes, recent travel, no sick contacts. No nausea/vomiting. Initially felt feverish but this has improved.   Has been controlling symptoms with immodium AD (2-3/day).   Similar episode end of 2016 - diagnosed with lymphocytic colitis by colonoscopy Allen Norris). At that time he was prescribed budesonide but didn't fill due to cost.   He rarely takes indocin. He does take aspirin daily   Relevant past medical, surgical, family and social history reviewed and updated as indicated. Interim medical history since our last visit reviewed. Allergies and medications reviewed and updated. Outpatient Medications Prior to Visit  Medication Sig Dispense Refill  . acyclovir (ZOVIRAX) 400 MG tablet Take 1 tablet (400 mg total) by mouth 2 (two) times daily. 180 tablet 3  . aspirin EC 81 MG tablet Take 81 mg by mouth daily.    Marland Kitchen atenolol-chlorthalidone (TENORETIC) 50-25 MG tablet Take 1 tablet by mouth daily. 90 tablet 3  . Cholecalciferol (VITAMIN D) 2000 units CAPS Take 1 capsule by mouth daily.    . indomethacin (INDOCIN) 50 MG capsule Take 1 capsule (50 mg total) by mouth daily as needed (for gout flare-up). 30 capsule 0  . lovastatin (MEVACOR) 40 MG tablet Take 2 tablets (80 mg total) by mouth at bedtime. 180 tablet 3  . metFORMIN (GLUCOPHAGE) 500 MG tablet Take 1 tablet (500 mg total)  by mouth daily with breakfast. 90 tablet 3  . Multiple Vitamin (MULTIVITAMIN) tablet Take 1 tablet by mouth daily.    . Omega-3 Fatty Acids (FISH OIL) 1360 MG CAPS Take 1 capsule by mouth daily.     . quinapril (ACCUPRIL) 20 MG tablet Take 1 tablet (20 mg total) by mouth at bedtime. 90 tablet 3  . vitamin C (ASCORBIC ACID) 500 MG tablet Take 500 mg by mouth daily.    . vitamin B-12 (CYANOCOBALAMIN) 500 MCG tablet Take 500 mcg by mouth daily.     No facility-administered medications prior to visit.      Per HPI unless specifically indicated in ROS section below Review of Systems     Objective:    BP 104/60 (BP Location: Left Arm, Patient Position: Sitting, Cuff Size: Large)   Pulse 68   Temp 97.9 F (36.6 C) (Oral)   Wt 254 lb (115.2 kg)   SpO2 94%   BMI 38.62 kg/m   Wt Readings from Last 3 Encounters:  04/28/17 254 lb (115.2 kg)  08/13/16 261 lb (118.4 kg)  08/09/16 262 lb 8 oz (119.1 kg)    Physical Exam  Constitutional: He appears well-developed and well-nourished. No distress.  HENT:  Mouth/Throat: Oropharynx is clear and moist. No oropharyngeal exudate.  Cardiovascular: Normal rate, regular rhythm, normal heart sounds and intact distal pulses.   No murmur heard. Pulmonary/Chest:  Effort normal and breath sounds normal. No respiratory distress. He has no wheezes. He has no rales.  Abdominal: Soft. Normal appearance and bowel sounds are normal. He exhibits no distension and no mass. There is no hepatosplenomegaly. There is no tenderness. There is no rigidity, no rebound, no guarding, no CVA tenderness and negative Murphy's sign.  No significant abd discomfort to palpation  Musculoskeletal: He exhibits no edema.  Skin: Skin is warm and dry. No rash noted.  Psychiatric: He has a normal mood and affect.  Nursing note and vitals reviewed.   Lab Results  Component Value Date   HGBA1C 6.2 08/09/2016       Assessment & Plan:   Problem List Items Addressed This Visit     Lymphocytic colitis - Primary    Anticipate lymphocytic colitis flare in h/o same 09/2015 that resolved on its own. Flare ongoing several weeks, no improvement despite regular imodium use. Will Rx entocort 9mg  x75mo then taper over 4 wks. Will price out and if unaffordable, will trial cholestyramine 4g QID + imodium. Discussed steroid hyperglycemia and need to monitor sugars while on budesonide.  Unclear cause - rare indocin use. ?aspirin use vs lovastatin.  Check CBC, CMP today r/o infectious cause, r/o lyte imbalance in setting of prolonged diarrhea.  If not improving as expected, low threshold to return to GI Dr Allen Norris. Pt agrees with plan.       Relevant Orders   Comprehensive metabolic panel   CBC with Differential/Platelet    Other Visit Diagnoses    Diarrhea, unspecified type       Relevant Orders   Comprehensive metabolic panel   CBC with Differential/Platelet       Follow up plan: No Follow-up on file.  Ria Bush, MD

## 2017-04-29 ENCOUNTER — Encounter: Payer: Self-pay | Admitting: *Deleted

## 2017-06-17 ENCOUNTER — Telehealth: Payer: Self-pay

## 2017-06-17 MED ORDER — BUDESONIDE 3 MG PO CPEP: ORAL_CAPSULE | ORAL | 0 refills | Status: DC

## 2017-06-17 NOTE — Telephone Encounter (Signed)
Spoke to pt and advised Rx sent 

## 2017-06-17 NOTE — Telephone Encounter (Signed)
plz notify taper sent to [facility]. If recurrent diarrhea after completes taper, will need to return to see Dr Allen Norris.

## 2017-06-17 NOTE — Telephone Encounter (Signed)
Pt said he did not read the directions correctly on the rx. He did not take the 3 capsules everyday for a month. He did the taper. Asking for a new rx. Please send to [facility]. Pt is asking if someone would call him when it was done. I told him I was not sure if someone will do that and he may need to check with the pharmacy later.

## 2017-06-18 MED FILL — BUDESONIDE 3 MG CPEP: 3 | 30 days supply | Qty: 90 | Fill #0

## 2017-07-15 ENCOUNTER — Other Ambulatory Visit: Payer: Self-pay | Admitting: Family Medicine

## 2017-07-15 ENCOUNTER — Telehealth: Payer: Self-pay

## 2017-07-15 MED FILL — BUDESONIDE 3 MG CPEP: 3 | 28 days supply | Qty: 42 | Fill #0

## 2017-07-15 NOTE — Telephone Encounter (Signed)
Last filled 06/18/17 #90, last OV 04/28/17 next OV none. Is this something you refill without an appt?

## 2017-07-15 NOTE — Telephone Encounter (Signed)
Pt wants to get remainder of refills for budesonide to [facility] out pt pharmacy. Pt will have [facility] outpt pharmacy contact [address] and have refills transferred. Pt has nothing further needed.

## 2017-08-25 ENCOUNTER — Other Ambulatory Visit: Payer: Self-pay | Admitting: Family Medicine

## 2017-08-25 ENCOUNTER — Telehealth: Payer: Self-pay | Admitting: Family Medicine

## 2017-08-25 DIAGNOSIS — K52832 Lymphocytic colitis: Secondary | ICD-10-CM

## 2017-08-25 DIAGNOSIS — E118 Type 2 diabetes mellitus with unspecified complications: Secondary | ICD-10-CM

## 2017-08-25 NOTE — Telephone Encounter (Signed)
Copied from [place] #[id number]. Topic: Referral - Request >> Aug 25, 2017  2:20 PM Arletha Grippe wrote: Reason for CRM: pt would like referral to  Dr Edison Pace @ [facility]  Dr Aundria Rud- Gi Doctor, he prefers [place] location cb number for pt is [phone]

## 2017-08-25 NOTE — Telephone Encounter (Signed)
Ordered in separate encounter and routing to [place].

## 2017-09-08 DIAGNOSIS — E119 Type 2 diabetes mellitus without complications: Secondary | ICD-10-CM | POA: Diagnosis not present

## 2017-09-08 LAB — HM DIABETES EYE EXAM

## 2017-09-09 ENCOUNTER — Telehealth: Payer: Self-pay | Admitting: Family Medicine

## 2017-09-09 NOTE — Telephone Encounter (Signed)
Please review

## 2017-09-09 NOTE — Telephone Encounter (Signed)
Copied from [place] [id number]. Topic: Quick Communication - See Telephone Encounter >> Sep 09, 2017  2:38 PM Arletha Grippe wrote: CRM for notification. See Telephone encounter for:   09/09/17. Pt is needing refill on quinapril (ACCUPRIL) 20 MG tablet. He is out and is asking for 30 day supply sent to [place] and a 90 day sent to [facility].  Also needs refill on lovastatin (MEVACOR) 40 MG tablet 90 day supply sent to [place].  Pt has cpe scheduled for 12/14 with pcp

## 2017-09-10 ENCOUNTER — Encounter: Payer: Self-pay | Admitting: Family Medicine

## 2017-09-10 ENCOUNTER — Other Ambulatory Visit: Payer: Self-pay | Admitting: Family Medicine

## 2017-09-10 MED ORDER — QUINAPRIL HCL 20 MG PO TABS: 20.0000 mg | ORAL_TABLET | Freq: Every day | ORAL | 0 refills | Status: DC

## 2017-09-10 MED ORDER — LOVASTATIN 40 MG PO TABS: 80.0000 mg | ORAL_TABLET | Freq: Every day | ORAL | 0 refills | Status: DC

## 2017-09-10 NOTE — Telephone Encounter (Signed)
I spoke with pt and he request lovastatin and quinapril to [place]; pt has CPX 10/10/17,last CPX 08/13/16; refilled per protocol and pt voiced understanding.

## 2017-09-10 NOTE — Telephone Encounter (Signed)
Copied from [place] #[id number]. Topic: Quick Communication - See Telephone Encounter >> Sep 10, 2017  1:18 PM Arletha Grippe wrote: CRM for notification. See Telephone encounter for:   09/10/17.pt needs 30 day supply of quinapril (ACCUPRIL) 20 MG tablet to [place] outpatient pharmacy,  this is in addition to the 90 day supply that was sent to [facility]  Cb num [phone] pt is waiting at pharmacy

## 2017-09-10 NOTE — Telephone Encounter (Signed)
Pt. Requesting again. Thanks.

## 2017-09-10 NOTE — Telephone Encounter (Signed)
Refilled as requested per protocol;pt voiced understanding.

## 2017-09-11 ENCOUNTER — Ambulatory Visit: Payer: Medicare HMO | Admitting: Gastroenterology

## 2017-09-11 DIAGNOSIS — Z0100 Encounter for examination of eyes and vision without abnormal findings: Secondary | ICD-10-CM | POA: Diagnosis not present

## 2017-09-17 ENCOUNTER — Other Ambulatory Visit: Payer: Self-pay | Admitting: Family Medicine

## 2017-09-28 ENCOUNTER — Other Ambulatory Visit: Payer: Self-pay | Admitting: Family Medicine

## 2017-09-28 DIAGNOSIS — E559 Vitamin D deficiency, unspecified: Secondary | ICD-10-CM

## 2017-09-28 DIAGNOSIS — E1122 Type 2 diabetes mellitus with diabetic chronic kidney disease: Secondary | ICD-10-CM

## 2017-09-28 DIAGNOSIS — E538 Deficiency of other specified B group vitamins: Secondary | ICD-10-CM

## 2017-09-28 DIAGNOSIS — Z125 Encounter for screening for malignant neoplasm of prostate: Secondary | ICD-10-CM

## 2017-09-28 DIAGNOSIS — M1A9XX0 Chronic gout, unspecified, without tophus (tophi): Secondary | ICD-10-CM

## 2017-09-28 DIAGNOSIS — N183 Chronic kidney disease, stage 3 unspecified: Secondary | ICD-10-CM

## 2017-09-28 DIAGNOSIS — E118 Type 2 diabetes mellitus with unspecified complications: Secondary | ICD-10-CM

## 2017-09-28 DIAGNOSIS — E782 Mixed hyperlipidemia: Secondary | ICD-10-CM

## 2017-09-29 ENCOUNTER — Encounter (INDEPENDENT_AMBULATORY_CARE_PROVIDER_SITE_OTHER): Payer: Self-pay

## 2017-09-29 ENCOUNTER — Encounter: Payer: Self-pay | Admitting: Gastroenterology

## 2017-09-29 ENCOUNTER — Ambulatory Visit: Payer: Medicare HMO | Admitting: Gastroenterology

## 2017-09-29 VITALS — BP 126/65 | HR 74 | Ht 69.0 in | Wt 252.0 lb

## 2017-09-29 DIAGNOSIS — K52832 Lymphocytic colitis: Secondary | ICD-10-CM

## 2017-09-29 MED ORDER — BUDESONIDE 3 MG PO CPEP: 3.0000 mg | ORAL_CAPSULE | Freq: Every day | ORAL | 5 refills | Status: DC

## 2017-09-29 MED FILL — BUDESONIDE 3 MG CAP: 3 | 30 days supply | Qty: 30 | Fill #0

## 2017-09-29 NOTE — Patient Instructions (Signed)
Titrate imodium up to formed bowel movement 3 to 4 times daily as needed. Start with 1mg  to 2mg  each dose.

## 2017-09-29 NOTE — Progress Notes (Signed)
Primary Care Physician: Ria Bush, MD  Primary Gastroenterologist:  Dr. [name]  Chief Complaint  Patient presents with  . Diarrhea    HPI: Thomas Carlson is a 71 y.o. male here for follow-up of his lymphocytic colitis. The patient states that he was given budesonide by me but because it was so expensive he did not use it. The diarrhea continued so he went to his primary care provider who gave him budesonide again but the patient thinks she was taking it incorrectly by taking one pill 3 times a day instead of the 3 pills once a day. The patient also reports that the diarrhea does not wake him up from a sleep but he does have diarrhea throughout the day. He thinks that when he was on the budesonide he had less frequent bowel movements with diarrhea only when he would eat. Now it is even when he is not eating.  Current Outpatient Medications  Medication Sig Dispense Refill  . acyclovir (ZOVIRAX) 400 MG tablet Take 1 tablet (400 mg total) by mouth 2 (two) times daily. 180 tablet 3  . aspirin EC 81 MG tablet Take 81 mg by mouth daily.    Marland Kitchen atenolol-chlorthalidone (TENORETIC) 50-25 MG tablet Take 1 tablet by mouth daily. 90 tablet 3  . b complex vitamins tablet Take 1 tablet by mouth daily.    . budesonide (ENTOCORT EC) 3 MG 24 hr capsule Take 1 capsule (3 mg total) by mouth daily. 30 capsule 5  . Cholecalciferol (VITAMIN D) 2000 units CAPS Take 1 capsule by mouth daily.    . indomethacin (INDOCIN) 50 MG capsule Take 1 capsule (50 mg total) by mouth daily as needed (for gout flare-up). 30 capsule 0  . lovastatin (MEVACOR) 40 MG tablet Take 2 tablets (80 mg total) at bedtime by mouth. 180 tablet 0  . metFORMIN (GLUCOPHAGE) 500 MG tablet TAKE 1 TABLET EVERY DAY WITH BREAKFAST 90 tablet 0  . Multiple Vitamin (MULTIVITAMIN) tablet Take 1 tablet by mouth daily.    . Omega-3 Fatty Acids (FISH OIL) 1360 MG CAPS Take 1 capsule by mouth daily.     . quinapril (ACCUPRIL) 20 MG tablet  Take 1 tablet (20 mg total) at bedtime by mouth. 90 tablet 0  . vitamin C (ASCORBIC ACID) 500 MG tablet Take 500 mg by mouth daily.     No current facility-administered medications for this visit.     Allergies as of 09/29/2017 - Review Complete 09/29/2017  Allergen Reaction Noted  . Penicillins Hives 06/18/2012  . Bactrim [sulfamethoxazole-trimethoprim] Rash 05/19/2013    ROS:  General: Negative for anorexia, weight loss, fever, chills, fatigue, weakness. ENT: Negative for hoarseness, difficulty swallowing , nasal congestion. CV: Negative for chest pain, angina, palpitations, dyspnea on exertion, peripheral edema.  Respiratory: Negative for dyspnea at rest, dyspnea on exertion, cough, sputum, wheezing.  GI: See history of present illness. GU:  Negative for dysuria, hematuria, urinary incontinence, urinary frequency, nocturnal urination.  Endo: Negative for unusual weight change.    Physical Examination:   BP 126/65 (BP Location: Left Arm, Patient Position: Sitting, Cuff Size: Large)   Pulse 74   Ht 5\' 9"  (1.753 m)   Wt 252 lb (114.3 kg)   BMI 37.21 kg/m   General: Well-nourished, well-developed in no acute distress.  Eyes: No icterus. Conjunctivae pink. Neuro: Alert and oriented x 3.  Grossly intact. Skin: Warm and dry, no jaundice.   Psych: Alert and cooperative, normal mood and affect.  Labs:    Imaging Studies: No results found.  Assessment and Plan:   Thomas Carlson is a 71 y.o. y/o male who has lymphocytic colitis that was found on a colonoscopy with biopsies. The patient has been explained that he needs to take budesonide 9 mg once a day. The patient will also titrate his Imodium multiple times a day and back off if he gets constipated. The patient will let me know if his symptoms are not improving. The patient has been explained the plan and agrees with it.    [name], MD. Marval Regal   Note: This dictation was prepared with Dragon dictation along with  smaller phrase technology. Any transcriptional errors that result from this process are unintentional.

## 2017-09-30 ENCOUNTER — Telehealth: Payer: Self-pay | Admitting: Family Medicine

## 2017-09-30 ENCOUNTER — Ambulatory Visit: Payer: Medicare HMO

## 2017-09-30 ENCOUNTER — Telehealth: Payer: Self-pay | Admitting: Gastroenterology

## 2017-09-30 ENCOUNTER — Other Ambulatory Visit (INDEPENDENT_AMBULATORY_CARE_PROVIDER_SITE_OTHER): Payer: Medicare HMO

## 2017-09-30 DIAGNOSIS — M1A9XX0 Chronic gout, unspecified, without tophus (tophi): Secondary | ICD-10-CM

## 2017-09-30 DIAGNOSIS — E538 Deficiency of other specified B group vitamins: Secondary | ICD-10-CM

## 2017-09-30 DIAGNOSIS — E559 Vitamin D deficiency, unspecified: Secondary | ICD-10-CM

## 2017-09-30 DIAGNOSIS — E118 Type 2 diabetes mellitus with unspecified complications: Secondary | ICD-10-CM | POA: Diagnosis not present

## 2017-09-30 DIAGNOSIS — N183 Chronic kidney disease, stage 3 (moderate): Secondary | ICD-10-CM

## 2017-09-30 DIAGNOSIS — E782 Mixed hyperlipidemia: Secondary | ICD-10-CM

## 2017-09-30 DIAGNOSIS — Z125 Encounter for screening for malignant neoplasm of prostate: Secondary | ICD-10-CM | POA: Diagnosis not present

## 2017-09-30 DIAGNOSIS — E1122 Type 2 diabetes mellitus with diabetic chronic kidney disease: Secondary | ICD-10-CM | POA: Diagnosis not present

## 2017-09-30 LAB — PSA, MEDICARE: PSA: 0.21 ng/mL (ref 0.10–4.00)

## 2017-09-30 LAB — COMPREHENSIVE METABOLIC PANEL
ALBUMIN: 4.3 g/dL (ref 3.5–5.2)
ALT: 14 U/L (ref 0–53)
AST: 18 U/L (ref 0–37)
Alkaline Phosphatase: 52 U/L (ref 39–117)
BUN: 25 mg/dL — ABNORMAL HIGH (ref 6–23)
CALCIUM: 9.7 mg/dL (ref 8.4–10.5)
CHLORIDE: 101 meq/L (ref 96–112)
CO2: 29 meq/L (ref 19–32)
Creatinine, Ser: 1.43 mg/dL (ref 0.40–1.50)
GFR: 51.61 mL/min — ABNORMAL LOW (ref >60.00)
Glucose, Bld: 116 mg/dL — ABNORMAL HIGH (ref 70–99)
POTASSIUM: 4 meq/L (ref 3.5–5.1)
Sodium: 139 mEq/L (ref 135–145)
Total Bilirubin: 0.5 mg/dL (ref 0.2–1.2)
Total Protein: 6.3 g/dL (ref 6.0–8.3)

## 2017-09-30 LAB — LIPID PANEL
CHOL/HDL RATIO: 4
CHOLESTEROL: 160 mg/dL (ref 0–200)
HDL: 37.3 mg/dL — AB (ref >39.00)
NONHDL: 122.62
TRIGLYCERIDES: 256 mg/dL — AB (ref 0.0–149.0)
VLDL: 51.2 mg/dL — AB (ref 0.0–40.0)

## 2017-09-30 LAB — URIC ACID: URIC ACID, SERUM: 11.7 mg/dL — AB (ref 4.0–7.8)

## 2017-09-30 LAB — VITAMIN D 25 HYDROXY (VIT D DEFICIENCY, FRACTURES): VITD: 38.01 ng/mL (ref 30.00–100.00)

## 2017-09-30 LAB — LDL CHOLESTEROL, DIRECT: LDL DIRECT: 96 mg/dL

## 2017-09-30 LAB — HEMOGLOBIN A1C: Hgb A1c MFr Bld: 6.8 % — ABNORMAL HIGH (ref 4.6–6.5)

## 2017-09-30 LAB — VITAMIN B12: Vitamin B-12: 332 pg/mL (ref 211–911)

## 2017-09-30 NOTE — Telephone Encounter (Signed)
Orders are in

## 2017-09-30 NOTE — Telephone Encounter (Signed)
Please call [facility] regarding instructions on budesonide 3 mg.

## 2017-09-30 NOTE — Telephone Encounter (Signed)
Pt was scheduled to see Lindell Noe today for AWV. Katha Cabal is out today and pt still would like to have labs completed.  Please enter AWV lab orders. Pt coming at 145pm

## 2017-10-02 ENCOUNTER — Telehealth: Payer: Self-pay | Admitting: *Deleted

## 2017-10-02 DIAGNOSIS — G8929 Other chronic pain: Secondary | ICD-10-CM

## 2017-10-02 DIAGNOSIS — M25561 Pain in right knee: Principal | ICD-10-CM

## 2017-10-02 NOTE — Telephone Encounter (Signed)
referral placed

## 2017-10-02 NOTE — Telephone Encounter (Signed)
Copied from [place] [id number]. Topic: Referral - Request >> Oct 02, 2017  9:17 AM Arletha Grippe wrote: Reason for CRM: pt called - he would like referral for ortho. For knee issues. He would like to go to Dr [place] [address] Cb number is [phone] for pt.

## 2017-10-02 NOTE — Telephone Encounter (Signed)
Contacted pt and discussed instructions for Budesonide.

## 2017-10-03 NOTE — Telephone Encounter (Signed)
LMOM for patient to call me back 

## 2017-10-08 ENCOUNTER — Ambulatory Visit: Payer: Medicare HMO

## 2017-10-09 DIAGNOSIS — M1711 Unilateral primary osteoarthritis, right knee: Secondary | ICD-10-CM | POA: Diagnosis not present

## 2017-10-10 ENCOUNTER — Encounter: Payer: Self-pay | Admitting: Family Medicine

## 2017-10-10 ENCOUNTER — Ambulatory Visit (INDEPENDENT_AMBULATORY_CARE_PROVIDER_SITE_OTHER): Payer: Medicare HMO | Admitting: Family Medicine

## 2017-10-10 VITALS — BP 130/76 | HR 68 | Temp 98.0°F | Ht 67.5 in | Wt 254.0 lb

## 2017-10-10 DIAGNOSIS — Z23 Encounter for immunization: Secondary | ICD-10-CM

## 2017-10-10 DIAGNOSIS — E538 Deficiency of other specified B group vitamins: Secondary | ICD-10-CM

## 2017-10-10 DIAGNOSIS — E559 Vitamin D deficiency, unspecified: Secondary | ICD-10-CM | POA: Diagnosis not present

## 2017-10-10 DIAGNOSIS — K52832 Lymphocytic colitis: Secondary | ICD-10-CM | POA: Diagnosis not present

## 2017-10-10 DIAGNOSIS — E782 Mixed hyperlipidemia: Secondary | ICD-10-CM | POA: Diagnosis not present

## 2017-10-10 DIAGNOSIS — Z0000 Encounter for general adult medical examination without abnormal findings: Secondary | ICD-10-CM

## 2017-10-10 DIAGNOSIS — M25561 Pain in right knee: Secondary | ICD-10-CM

## 2017-10-10 DIAGNOSIS — I2510 Atherosclerotic heart disease of native coronary artery without angina pectoris: Secondary | ICD-10-CM

## 2017-10-10 DIAGNOSIS — Z7189 Other specified counseling: Secondary | ICD-10-CM

## 2017-10-10 DIAGNOSIS — N183 Chronic kidney disease, stage 3 unspecified: Secondary | ICD-10-CM

## 2017-10-10 DIAGNOSIS — I10 Essential (primary) hypertension: Secondary | ICD-10-CM

## 2017-10-10 DIAGNOSIS — M1A9XX0 Chronic gout, unspecified, without tophus (tophi): Secondary | ICD-10-CM | POA: Diagnosis not present

## 2017-10-10 DIAGNOSIS — E118 Type 2 diabetes mellitus with unspecified complications: Secondary | ICD-10-CM

## 2017-10-10 DIAGNOSIS — E1122 Type 2 diabetes mellitus with diabetic chronic kidney disease: Secondary | ICD-10-CM

## 2017-10-10 DIAGNOSIS — G8929 Other chronic pain: Secondary | ICD-10-CM | POA: Diagnosis not present

## 2017-10-10 DIAGNOSIS — I358 Other nonrheumatic aortic valve disorders: Secondary | ICD-10-CM

## 2017-10-10 MED ORDER — QUINAPRIL HCL 20 MG PO TABS: 20.0000 mg | ORAL_TABLET | Freq: Every day | ORAL | 3 refills | Status: DC

## 2017-10-10 MED ORDER — METFORMIN HCL 500 MG PO TABS: 500.0000 mg | ORAL_TABLET | Freq: Every day | ORAL | 3 refills | Status: DC

## 2017-10-10 MED ORDER — ACYCLOVIR 400 MG PO TABS: 400.0000 mg | ORAL_TABLET | Freq: Two times a day (BID) | ORAL | 3 refills | Status: DC

## 2017-10-10 MED ORDER — LOVASTATIN 40 MG PO TABS: 80.0000 mg | ORAL_TABLET | Freq: Every day | ORAL | 3 refills | Status: DC

## 2017-10-10 MED ORDER — ALLOPURINOL 100 MG PO TABS: 100.0000 mg | ORAL_TABLET | Freq: Every day | ORAL | 3 refills | Status: DC

## 2017-10-10 MED ORDER — ATENOLOL-CHLORTHALIDONE 50-25 MG PO TABS: 1.0000 | ORAL_TABLET | Freq: Every day | ORAL | 3 refills | Status: DC

## 2017-10-10 NOTE — Assessment & Plan Note (Signed)
Chronic, stable. Asxs. Continue aspirin, statin.

## 2017-10-10 NOTE — Assessment & Plan Note (Signed)
UA elevated. Endorses a few flares a year. Start allopurinol 100mg  daily.

## 2017-10-10 NOTE — Assessment & Plan Note (Signed)

## 2017-10-10 NOTE — Assessment & Plan Note (Signed)
Chronic, stable. Continue current regimen. 

## 2017-10-10 NOTE — Assessment & Plan Note (Addendum)
Chronic, stable. Encouraged good water intake. Will need to monitor on daily meloxicam 7.5mg 

## 2017-10-10 NOTE — Assessment & Plan Note (Signed)
Advanced directive - working on living will at home - will check on this. HCPOA - wife.

## 2017-10-10 NOTE — Assessment & Plan Note (Signed)
Ongoing diarrhea. Discussed continued imodium titration. Confusion over budesonide dosing.  rec touch base with GI if persistent diarrhea despite treatment plan.

## 2017-10-10 NOTE — Assessment & Plan Note (Addendum)
Chronic, stable. Continue metformin 500mg  daily. If worsening control, low threshold to increase back to BID dosing.

## 2017-10-10 NOTE — Assessment & Plan Note (Addendum)
Discussed healthy diet and lifestyle changes to affect sustainable weight loss  

## 2017-10-10 NOTE — Assessment & Plan Note (Signed)
Has established with ortho - on mobic and PT pending.

## 2017-10-10 NOTE — Assessment & Plan Note (Addendum)
Chronic, mild deterioration with worse sugar control. Continue lovastatin 80mg  and fish oil daily. The 10-year ASCVD risk score Mikey Bussing DC Brooke Bonito., et al., 2013) is: 42.2%   Values used to calculate the score:     Age: 71 years     Sex: Male     Is Non-Hispanic African American: No     Diabetic: Yes     Tobacco smoker: No     Systolic Blood Pressure: 219 mmHg     Is BP treated: Yes     HDL Cholesterol: 37.3 mg/dL     Total Cholesterol: 160 mg/dL

## 2017-10-10 NOTE — Progress Notes (Signed)
BP 130/76 (BP Location: Left Arm, Patient Position: Sitting, Cuff Size: Large)   Pulse 68   Temp 98 F (36.7 C) (Oral)   Ht 5' 7.5" (1.715 m)   Wt 254 lb (115.2 kg)   SpO2 94%   BMI 39.19 kg/m    CC: medicare wellness visit Subjective:    Patient ID: Thomas Carlson, male    DOB: 05-28-1946, 71 y.o.   MRN: [id number]  HPI: Thomas Carlson is a 71 y.o. male presenting on 10/10/2017 for Medicare Wellness   Lymphocytic colitis - established with Dr Allen Norris. On budesonide 3mg  daily.  R knee - seeing ortho - started on meloxicam and PT.               Hearing Screening   125Hz  250Hz  500Hz  1000Hz  2000Hz  3000Hz  4000Hz  6000Hz  8000Hz   Right ear:   40 20 20  20     Left ear:   20 20 20  20     Vision Screening Comments: Last eye exam end of 08/2017 Denies depression or falls.   Preventative: COLONOSCOPY WITH PROPOFOL 11/10/2015;diverticulosis,biopsy showedmicroscopic colitis [name], MD) Prostate - requests screening QOyr,due 2018 Flu shot yearly Pneumovax 06/2012, prevnar10/2017 Td- 2012  zostavax 2011  Advanced directive - working on living will at home - will check on this. HCPOA - wife.  Seat belt use discuss  Sunscreen use discussed, no changing moles on skin. Non smoker Alcohol - none  Caffeine: 2 cans of diet soda/day  Lives with wife. 1 dog at home. 2 grown children  Occupation: Advertising copywriter at [employer], current [employer] 2015 Edu: Master's degree  Activity: walking (fit bit), limited by R knee  Diet: some water, lots of diet sodas, fruits/vegetables occasionally, cutting back on carbs  Relevant past medical, surgical, family and social history reviewed and updated as indicated. Interim medical history since our last visit reviewed. Allergies and medications reviewed and updated. Outpatient Medications Prior to Visit  Medication Sig Dispense Refill  . aspirin EC 81 MG tablet Take 81 mg by mouth daily.    Marland Kitchen b complex  vitamins tablet Take 1 tablet by mouth daily.    . budesonide (ENTOCORT EC) 3 MG 24 hr capsule Take 1 capsule (3 mg total) by mouth daily. 30 capsule 5  . Cholecalciferol (VITAMIN D) 2000 units CAPS Take 1 capsule by mouth daily.    . indomethacin (INDOCIN) 50 MG capsule Take 1 capsule (50 mg total) by mouth daily as needed (for gout flare-up). 30 capsule 0  . meloxicam (MOBIC) 7.5 MG tablet Take 7.5 mg by mouth daily.  0  . Multiple Vitamin (MULTIVITAMIN) tablet Take 1 tablet by mouth daily.    . Omega-3 Fatty Acids (FISH OIL) 1360 MG CAPS Take 1 capsule by mouth daily.     . vitamin C (ASCORBIC ACID) 500 MG tablet Take 500 mg by mouth daily.    Marland Kitchen acyclovir (ZOVIRAX) 400 MG tablet Take 1 tablet (400 mg total) by mouth 2 (two) times daily. 180 tablet 3  . atenolol-chlorthalidone (TENORETIC) 50-25 MG tablet Take 1 tablet by mouth daily. 90 tablet 3  . lovastatin (MEVACOR) 40 MG tablet Take 2 tablets (80 mg total) at bedtime by mouth. 180 tablet 0  . metFORMIN (GLUCOPHAGE) 500 MG tablet TAKE 1 TABLET EVERY DAY WITH BREAKFAST 90 tablet 0  . quinapril (ACCUPRIL) 20 MG tablet Take 1 tablet (20 mg total) at bedtime by mouth. 90 tablet 0   No facility-administered medications prior  to visit.      Per HPI unless specifically indicated in ROS section below Review of Systems  Constitutional: Negative for activity change, appetite change, chills, fatigue, fever and unexpected weight change.  HENT: Negative for hearing loss.   Eyes: Negative for visual disturbance.  Respiratory: Negative for cough, chest tightness, shortness of breath and wheezing.   Cardiovascular: Negative for chest pain, palpitations and leg swelling.  Gastrointestinal: Positive for diarrhea. Negative for abdominal distention, abdominal pain, blood in stool, constipation, nausea and vomiting.  Genitourinary: Negative for difficulty urinating and hematuria.  Musculoskeletal: Negative for arthralgias, myalgias and neck pain.  Skin:  Negative for rash.  Neurological: Negative for dizziness, seizures, syncope and headaches.  Hematological: Negative for adenopathy. Does not bruise/bleed easily.  Psychiatric/Behavioral: Negative for dysphoric mood. The patient is not nervous/anxious.        Objective:    BP 130/76 (BP Location: Left Arm, Patient Position: Sitting, Cuff Size: Large)   Pulse 68   Temp 98 F (36.7 C) (Oral)   Ht 5' 7.5" (1.715 m)   Wt 254 lb (115.2 kg)   SpO2 94%   BMI 39.19 kg/m   Wt Readings from Last 3 Encounters:  10/10/17 254 lb (115.2 kg)  09/29/17 252 lb (114.3 kg)  04/28/17 254 lb (115.2 kg)    Physical Exam  Constitutional: He is oriented to person, place, and time. He appears well-developed and well-nourished. No distress.  HENT:  Head: Normocephalic and atraumatic.  Right Ear: Hearing, tympanic membrane, external ear and ear canal normal.  Left Ear: Hearing, tympanic membrane, external ear and ear canal normal.  Nose: Nose normal.  Mouth/Throat: Uvula is midline, oropharynx is clear and moist and mucous membranes are normal. No oropharyngeal exudate, posterior oropharyngeal edema or posterior oropharyngeal erythema.  Eyes: Conjunctivae and EOM are normal. Pupils are equal, round, and reactive to light. No scleral icterus.  Neck: Normal range of motion. Neck supple. Carotid bruit is not present. No thyromegaly present.  Cardiovascular: Normal rate, regular rhythm and intact distal pulses.  Murmur (3/6 systolic USB) heard. Pulses:      Radial pulses are 2+ on the right side, and 2+ on the left side.  Pulmonary/Chest: Effort normal and breath sounds normal. No respiratory distress. He has no wheezes. He has no rales.  Abdominal: Soft. Bowel sounds are normal. He exhibits no distension and no mass. There is no tenderness. There is no rebound and no guarding.  Genitourinary: Rectum normal and prostate normal. Rectal exam shows no external hemorrhoid, no internal hemorrhoid, no fissure, no  mass, no tenderness and anal tone normal. Prostate is not enlarged (15gm) and not tender.  Musculoskeletal: Normal range of motion. He exhibits no edema.  See HPI for foot exam if done  Lymphadenopathy:    He has no cervical adenopathy.  Neurological: He is alert and oriented to person, place, and time.  CN grossly intact, station and gait intact Recall 3/3 Calculation 4/5 serial 7s   Skin: Skin is warm and dry. No rash noted.  Psychiatric: He has a normal mood and affect. His behavior is normal. Judgment and thought content normal.  Nursing note and vitals reviewed.  Diabetic Foot Exam - Simple   Simple Foot Form Diabetic Foot exam was performed with the following findings:  Yes 10/10/2017  4:13 PM  Visual Inspection No deformities, no ulcerations, no other skin breakdown bilaterally:  Yes Sensation Testing Intact to touch and monofilament testing bilaterally:  Yes Pulse Check Posterior Tibialis and  Dorsalis pulse intact bilaterally:  Yes Comments 2+ DP bilaterally Scabbed ulcer L 4th toe     Results for orders placed or performed in visit on 09/30/17  PSA, Medicare  Result Value Ref Range   PSA 0.21 0.10 - 4.00 ng/ml  Uric acid  Result Value Ref Range   Uric Acid, Serum 11.7 (H) 4.0 - 7.8 mg/dL  Hemoglobin A1c  Result Value Ref Range   Hgb A1c MFr Bld 6.8 (H) 4.6 - 6.5 %  Comprehensive metabolic panel  Result Value Ref Range   Sodium 139 135 - 145 mEq/L   Potassium 4.0 3.5 - 5.1 mEq/L   Chloride 101 96 - 112 mEq/L   CO2 29 19 - 32 mEq/L   Glucose, Bld 116 (H) 70 - 99 mg/dL   BUN 25 (H) 6 - 23 mg/dL   Creatinine, Ser 1.43 0.40 - 1.50 mg/dL   Total Bilirubin 0.5 0.2 - 1.2 mg/dL   Alkaline Phosphatase 52 39 - 117 U/L   AST 18 0 - 37 U/L   ALT 14 0 - 53 U/L   Total Protein 6.3 6.0 - 8.3 g/dL   Albumin 4.3 3.5 - 5.2 g/dL   Calcium 9.7 8.4 - 10.5 mg/dL   GFR 51.61 (L) >60.00 mL/min  Lipid panel  Result Value Ref Range   Cholesterol 160 0 - 200 mg/dL    Triglycerides 256.0 (H) 0.0 - 149.0 mg/dL   HDL 37.30 (L) >39.00 mg/dL   VLDL 51.2 (H) 0.0 - 40.0 mg/dL   Total CHOL/HDL Ratio 4    NonHDL 122.62   VITAMIN D 25 Hydroxy (Vit-D Deficiency, Fractures)  Result Value Ref Range   VITD 38.01 30.00 - 100.00 ng/mL  Vitamin B12  Result Value Ref Range   Vitamin B-12 332 211 - 911 pg/mL  LDL cholesterol, direct  Result Value Ref Range   Direct LDL 96.0 mg/dL      Assessment & Plan:   Problem List Items Addressed This Visit    Advanced care planning/counseling discussion    Advanced directive - working on living will at home - will check on this. HCPOA - wife.       Aortic valve sclerosis   Relevant Medications   atenolol-chlorthalidone (TENORETIC) 50-25 MG tablet   lovastatin (MEVACOR) 40 MG tablet   quinapril (ACCUPRIL) 20 MG tablet   CAD (coronary artery disease)    Chronic, stable. Asxs. Continue aspirin, statin.       Relevant Medications   atenolol-chlorthalidone (TENORETIC) 50-25 MG tablet   lovastatin (MEVACOR) 40 MG tablet   quinapril (ACCUPRIL) 20 MG tablet   CKD stage 3 due to type 2 diabetes mellitus (HCC)    Chronic, stable. Encouraged good water intake. Will need to monitor on daily meloxicam 7.5mg       Relevant Medications   atenolol-chlorthalidone (TENORETIC) 50-25 MG tablet   lovastatin (MEVACOR) 40 MG tablet   metFORMIN (GLUCOPHAGE) 500 MG tablet   quinapril (ACCUPRIL) 20 MG tablet   Diabetes mellitus type 2, controlled, with complications (HCC)    Chronic, stable. Continue metformin 500mg  daily. If worsening control, low threshold to increase back to BID dosing.       Relevant Medications   atenolol-chlorthalidone (TENORETIC) 50-25 MG tablet   lovastatin (MEVACOR) 40 MG tablet   metFORMIN (GLUCOPHAGE) 500 MG tablet   quinapril (ACCUPRIL) 20 MG tablet   Gout    UA elevated. Endorses a few flares a year. Start allopurinol 100mg  daily.  Healthcare maintenance    Preventative protocols reviewed and  updated unless pt declined. Discussed healthy diet and lifestyle.       HLD (hyperlipidemia)    Chronic, mild deterioration with worse sugar control. Continue lovastatin 80mg  and fish oil daily. The 10-year ASCVD risk score Mikey Bussing DC Brooke Bonito., et al., 2013) is: 42.2%   Values used to calculate the score:     Age: 57 years     Sex: Male     Is Non-Hispanic African American: No     Diabetic: Yes     Tobacco smoker: No     Systolic Blood Pressure: 606 mmHg     Is BP treated: Yes     HDL Cholesterol: 37.3 mg/dL     Total Cholesterol: 160 mg/dL       Relevant Medications   atenolol-chlorthalidone (TENORETIC) 50-25 MG tablet   lovastatin (MEVACOR) 40 MG tablet   quinapril (ACCUPRIL) 20 MG tablet   HTN (hypertension)    Chronic, stable. Continue current regimen.       Relevant Medications   atenolol-chlorthalidone (TENORETIC) 50-25 MG tablet   lovastatin (MEVACOR) 40 MG tablet   quinapril (ACCUPRIL) 20 MG tablet   Low vitamin B12 level    rec 500-1021mcg b12 daily. He will check on dosing of b complex vitamin.       Lymphocytic colitis    Ongoing diarrhea. Discussed continued imodium titration. Confusion over budesonide dosing.  rec touch base with GI if persistent diarrhea despite treatment plan.       Medicare annual wellness visit, subsequent - Primary    I have personally reviewed the Medicare Annual Wellness questionnaire and have noted 1. The patient's medical and social history 2. Their use of alcohol, tobacco or illicit drugs 3. Their current medications and supplements 4. The patient's functional ability including ADL's, fall risks, home safety risks and hearing or visual impairment. Cognitive function has been assessed and addressed as indicated.  5. Diet and physical activity 6. Evidence for depression or mood disorders The patients weight, height, BMI have been recorded in the chart. I have made referrals, counseling and provided education to the patient based on  review of the above and I have provided the pt with a written personalized care plan for preventive services. Provider list updated.. See scanned questionairre as needed for further documentation. Reviewed preventative protocols and updated unless pt declined.       Right knee pain    Has established with ortho - on mobic and PT pending.       Severe obesity (BMI 35.0-39.9) with comorbidity ([place])    Discussed healthy diet and lifestyle changes to affect sustainable weight loss.       Relevant Medications   metFORMIN (GLUCOPHAGE) 500 MG tablet   Vitamin D deficiency    Continue 2000 IU daily.        Other Visit Diagnoses    Need for influenza vaccination       Relevant Orders   Flu Vaccine QUAD 6+ mos PF IM (Fluarix Quad PF) (Completed)       Follow up plan: Return in about 1 year (around 10/10/2018) for annual exam, prior fasting for blood work, medicare wellness visit.  Ria Bush, MD

## 2017-10-10 NOTE — Assessment & Plan Note (Signed)
Continue 2000 IU daily.  

## 2017-10-10 NOTE — Patient Instructions (Addendum)
Flu shot today.  If interested, check with pharmacy about new 2 shot shingles series (shingrix).  Start allopurinol 100mg  daily sent to pharmacy Continue other medicines - refilled today. Return as needed or in 1 year for next medicare wellness visit and physical Continue titrating imodium and touch base with Dr Allen Norris if ongoing diarrhea.  Health Maintenance, Male A healthy lifestyle and preventive care is important for your health and wellness. Ask your health care provider about what schedule of regular examinations is right for you. What should I know about weight and diet? Eat a Healthy Diet  Eat plenty of vegetables, fruits, whole grains, low-fat dairy products, and lean protein.  Do not eat a lot of foods high in solid fats, added sugars, or salt.  Maintain a Healthy Weight Regular exercise can help you achieve or maintain a healthy weight. You should:  Do at least 150 minutes of exercise each week. The exercise should increase your heart rate and make you sweat (moderate-intensity exercise).  Do strength-training exercises at least twice a week.  Watch Your Levels of Cholesterol and Blood Lipids  Have your blood tested for lipids and cholesterol every 5 years starting at 72 years of age. If you are at high risk for heart disease, you should start having your blood tested when you are 72 years old. You may need to have your cholesterol levels checked more often if: ? Your lipid or cholesterol levels are high. ? You are older than 72 years of age. ? You are at high risk for heart disease.  What should I know about cancer screening? Many types of cancers can be detected early and may often be prevented. Lung Cancer  You should be screened every year for lung cancer if: ? You are a current smoker who has smoked for at least 30 years. ? You are a former smoker who has quit within the past 15 years.  Talk to your health care provider about your screening options, when you should  start screening, and how often you should be screened.  Colorectal Cancer  Routine colorectal cancer screening usually begins at 72 years of age and should be repeated every 5-10 years until you are 72 years old. You may need to be screened more often if early forms of precancerous polyps or small growths are found. Your health care provider may recommend screening at an earlier age if you have risk factors for colon cancer.  Your health care provider may recommend using home test kits to check for hidden blood in the stool.  A small camera at the end of a tube can be used to examine your colon (sigmoidoscopy or colonoscopy). This checks for the earliest forms of colorectal cancer.  Prostate and Testicular Cancer  Depending on your age and overall health, your health care provider may do certain tests to screen for prostate and testicular cancer.  Talk to your health care provider about any symptoms or concerns you have about testicular or prostate cancer.  Skin Cancer  Check your skin from head to toe regularly.  Tell your health care provider about any new moles or changes in moles, especially if: ? There is a change in a mole's size, shape, or color. ? You have a mole that is larger than a pencil eraser.  Always use sunscreen. Apply sunscreen liberally and repeat throughout the day.  Protect yourself by wearing long sleeves, pants, a wide-brimmed hat, and sunglasses when outside.  What should I know about heart  disease, diabetes, and high blood pressure?  If you are 59-30 years of age, have your blood pressure checked every 3-5 years. If you are 33 years of age or older, have your blood pressure checked every year. You should have your blood pressure measured twice-once when you are at a hospital or clinic, and once when you are not at a hospital or clinic. Record the average of the two measurements. To check your blood pressure when you are not at a hospital or clinic, you can  use: ? An automated blood pressure machine at a pharmacy. ? A home blood pressure monitor.  Talk to your health care provider about your target blood pressure.  If you are between 36-79 years old, ask your health care provider if you should take aspirin to prevent heart disease.  Have regular diabetes screenings by checking your fasting blood sugar level. ? If you are at a normal weight and have a low risk for diabetes, have this test once every three years after the age of 7. ? If you are overweight and have a high risk for diabetes, consider being tested at a younger age or more often.  A one-time screening for abdominal aortic aneurysm (AAA) by ultrasound is recommended for men aged 30-75 years who are current or former smokers. What should I know about preventing infection? Hepatitis B If you have a higher risk for hepatitis B, you should be screened for this virus. Talk with your health care provider to find out if you are at risk for hepatitis B infection. Hepatitis C Blood testing is recommended for:  Everyone born from [birth year] through 1966.  Anyone with known risk factors for hepatitis C.  Sexually Transmitted Diseases (STDs)  You should be screened each year for STDs including gonorrhea and chlamydia if: ? You are sexually active and are younger than 72 years of age. ? You are older than 72 years of age and your health care provider tells you that you are at risk for this type of infection. ? Your sexual activity has changed since you were last screened and you are at an increased risk for chlamydia or gonorrhea. Ask your health care provider if you are at risk.  Talk with your health care provider about whether you are at high risk of being infected with HIV. Your health care provider may recommend a prescription medicine to help prevent HIV infection.  What else can I do?  Schedule regular health, dental, and eye exams.  Stay current with your vaccines  (immunizations).  Do not use any tobacco products, such as cigarettes, chewing tobacco, and e-cigarettes. If you need help quitting, ask your health care provider.  Limit alcohol intake to no more than 2 drinks per day. One drink equals 12 ounces of beer, 5 ounces of wine, or 1 ounces of hard liquor.  Do not use street drugs.  Do not share needles.  Ask your health care provider for help if you need support or information about quitting drugs.  Tell your health care provider if you often feel depressed.  Tell your health care provider if you have ever been abused or do not feel safe at home. This information is not intended to replace advice given to you by your health care provider. Make sure you discuss any questions you have with your health care provider. Document Released: 04/11/2008 Document Revised: 06/12/2016 Document Reviewed: 07/18/2015 Elsevier Interactive Patient Education  [employer].

## 2017-10-10 NOTE — Assessment & Plan Note (Signed)
rec 500-1031mcg b12 daily. He will check on dosing of b complex vitamin.

## 2017-10-10 NOTE — Assessment & Plan Note (Signed)
Preventative protocols reviewed and updated unless pt declined. Discussed healthy diet and lifestyle.  

## 2017-10-31 ENCOUNTER — Other Ambulatory Visit: Payer: Self-pay

## 2017-10-31 MED ORDER — BUDESONIDE 3 MG PO CPEP: 9.0000 mg | ORAL_CAPSULE | Freq: Every day | ORAL | 3 refills | Status: DC

## 2017-10-31 MED FILL — BUDESONIDE 3 MG CPEP: 3 | 30 days supply | Qty: 90 | Fill #0

## 2017-11-03 ENCOUNTER — Telehealth: Payer: Self-pay | Admitting: Gastroenterology

## 2017-11-03 NOTE — Telephone Encounter (Signed)
Pt advised to take 3 capsules together in the morning. Do not spread out through out the day.

## 2017-11-03 NOTE — Telephone Encounter (Signed)
Patient called and left a message on machine. He has just picked up prescription and would like to confirm how to take it. Please call patient @ [phone].

## 2017-12-03 ENCOUNTER — Telehealth: Payer: Self-pay | Admitting: Gastroenterology

## 2017-12-03 NOTE — Telephone Encounter (Signed)
Patient left a voice message that he is about to finish his Budesonide. Will he need a refill? Please call

## 2017-12-04 ENCOUNTER — Other Ambulatory Visit: Payer: Self-pay

## 2017-12-04 MED ORDER — BUDESONIDE 3 MG PO CPEP: ORAL_CAPSULE | ORAL | 1 refills | Status: DC

## 2017-12-04 MED FILL — BUDESONIDE 3 MG CPEP: 3 | 30 days supply | Qty: 90 | Fill #0

## 2017-12-04 NOTE — Telephone Encounter (Signed)
If he is doing well he can see if the 3mg  keeps him well.

## 2017-12-04 NOTE — Telephone Encounter (Signed)
Left vm for pt to return my call.   Dr. Allen Norris, should pt continue taking the 49mcg of Budesonide or start a 3mg  maintenance dose?

## 2017-12-04 NOTE — Telephone Encounter (Signed)
Pt states he hadn't started the 3mg  budesonide because he messed up the instructions previously. Pt has been instructed to finish the 9mg  tomorrow and start taking the 3mg  daily to see if keeps his symptoms under control. If not, pt is to resume the 9mg  daily.

## 2018-02-09 ENCOUNTER — Other Ambulatory Visit: Payer: Self-pay

## 2018-02-13 ENCOUNTER — Ambulatory Visit (INDEPENDENT_AMBULATORY_CARE_PROVIDER_SITE_OTHER): Payer: Medicare HMO | Admitting: Urgent Care

## 2018-02-13 ENCOUNTER — Ambulatory Visit: Payer: Self-pay

## 2018-02-13 ENCOUNTER — Encounter: Payer: Self-pay | Admitting: Urgent Care

## 2018-02-13 ENCOUNTER — Ambulatory Visit (INDEPENDENT_AMBULATORY_CARE_PROVIDER_SITE_OTHER): Payer: Medicare HMO

## 2018-02-13 VITALS — BP 150/79 | HR 64 | Temp 97.8°F | Resp 18 | Ht 67.5 in

## 2018-02-13 DIAGNOSIS — M109 Gout, unspecified: Secondary | ICD-10-CM

## 2018-02-13 DIAGNOSIS — M25561 Pain in right knee: Secondary | ICD-10-CM

## 2018-02-13 DIAGNOSIS — M25551 Pain in right hip: Secondary | ICD-10-CM | POA: Diagnosis not present

## 2018-02-13 DIAGNOSIS — M1711 Unilateral primary osteoarthritis, right knee: Secondary | ICD-10-CM

## 2018-02-13 MED ORDER — PREDNISONE 20 MG PO TABS: ORAL_TABLET | ORAL | 0 refills | Status: DC

## 2018-02-13 NOTE — Patient Instructions (Addendum)
Do not take any other NSAID including diclofenac, ibuprofen, naproxen, Aleve, Motrin, meloxicam, Mobic etc while taking prednisone.      Osteoarthritis Osteoarthritis is a type of arthritis that affects tissue that covers the ends of bones in joints (cartilage). Cartilage acts as a cushion between the bones and helps them move smoothly. Osteoarthritis results when cartilage in the joints gets worn down. Osteoarthritis is sometimes called "wear and tear" arthritis. Osteoarthritis is the most common form of arthritis. It often occurs in older people. It is a condition that gets worse over time (a progressive condition). Joints that are most often affected by this condition are in:  Fingers.  Toes.  Hips.  Knees.  Spine, including neck and lower back.  What are the causes? This condition is caused by age-related wearing down of cartilage that covers the ends of bones. What increases the risk? The following factors may make you more likely to develop this condition:  Older age.  Being overweight or obese.  Overuse of joints, such as in athletes.  Past injury of a joint.  Past surgery on a joint.  Family history of osteoarthritis.  What are the signs or symptoms? The main symptoms of this condition are pain, swelling, and stiffness in the joint. The joint may lose its shape over time. Small pieces of bone or cartilage may break off and float inside of the joint, which may cause more pain and damage to the joint. Small deposits of bone (osteophytes) may grow on the edges of the joint. Other symptoms may include:  A grating or scraping feeling inside the joint when you move it.  Popping or creaking sounds when you move.  Symptoms may affect one or more joints. Osteoarthritis in a major joint, such as your knee or hip, can make it painful to walk or exercise. If you have osteoarthritis in your hands, you might not be able to grip items, twist your hand, or control small movements of  your hands and fingers (fine motor skills). How is this diagnosed? This condition may be diagnosed based on:  Your medical history.  A physical exam.  Your symptoms.  X-rays of the affected joint(s).  Blood tests to rule out other types of arthritis.  How is this treated? There is no cure for this condition, but treatment can help to control pain and improve joint function. Treatment plans may include:  A prescribed exercise program that allows for rest and joint relief. You may work with a physical therapist.  A weight control plan.  Pain relief techniques, such as: ? Applying heat and cold to the joint. ? Electric pulses delivered to nerve endings under the skin (transcutaneous electrical nerve stimulation, or TENS). ? Massage. ? Certain nutritional supplements.  NSAIDs or prescription medicines to help relieve pain.  Medicine to help relieve pain and inflammation (corticosteroids). This can be given by mouth (orally) or as an injection.  Assistive devices, such as a brace, wrap, splint, specialized glove, or cane.  Surgery, such as: ? An osteotomy. This is done to reposition the bones and relieve pain or to remove loose pieces of bone and cartilage. ? Joint replacement surgery. You may need this surgery if you have very bad (advanced) osteoarthritis.  Follow these instructions at home: Activity  Rest your affected joints as directed by your health care provider.  Do not drive or use heavy machinery while taking prescription pain medicine.  Exercise as directed. Your health care provider or physical therapist may recommend specific  types of exercise, such as: ? Strengthening exercises. These are done to strengthen the muscles that support joints that are affected by arthritis. They can be performed with weights or with exercise bands to add resistance. ? Aerobic activities. These are exercises, such as brisk walking or water aerobics, that get your heart  pumping. ? Range-of-motion activities. These keep your joints easy to move. ? Balance and agility exercises. Managing pain, stiffness, and swelling  If directed, apply heat to the affected area as often as told by your health care provider. Use the heat source that your health care provider recommends, such as a moist heat pack or a heating pad. ? If you have a removable assistive device, remove it as told by your health care provider. ? Place a towel between your skin and the heat source. If your health care provider tells you to keep the assistive device on while you apply heat, place a towel between the assistive device and the heat source. ? Leave the heat on for 20-30 minutes. ? Remove the heat if your skin turns bright red. This is especially important if you are unable to feel pain, heat, or cold. You may have a greater risk of getting burned.  If directed, put ice on the affected joint: ? If you have a removable assistive device, remove it as told by your health care provider. ? Put ice in a plastic bag. ? Place a towel between your skin and the bag. If your health care provider tells you to keep the assistive device on during icing, place a towel between the assistive device and the bag. ? Leave the ice on for 20 minutes, 2-3 times a day. General instructions  Take over-the-counter and prescription medicines only as told by your health care provider.  Maintain a healthy weight. Follow instructions from your health care provider for weight control. These may include dietary restrictions.  Do not use any products that contain nicotine or tobacco, such as cigarettes and e-cigarettes. These can delay bone healing. If you need help quitting, ask your health care provider.  Use assistive devices as directed by your health care provider.  Keep all follow-up visits as told by your health care provider. This is important. Where to find more information:  [employer] of Arthritis  and Musculoskeletal and Skin Diseases: www.niams.SouthExposed.es  [employer] on Aging: http://kim-miller.com/  American College of Rheumatology: www.rheumatology.org Contact a health care provider if:  Your skin turns red.  You develop a rash.  You have pain that gets worse.  You have a fever along with joint or muscle aches. Get help right away if:  You lose a lot of weight.  You suddenly lose your appetite.  You have night sweats. Summary  Osteoarthritis is a type of arthritis that affects tissue covering the ends of bones in joints (cartilage).  This condition is caused by age-related wearing down of cartilage that covers the ends of bones.  The main symptom of this condition is pain, swelling, and stiffness in the joint.  There is no cure for this condition, but treatment can help to control pain and improve joint function. This information is not intended to replace advice given to you by your health care provider. Make sure you discuss any questions you have with your health care provider. Document Released: 10/14/2005 Document Revised: 06/17/2016 Document Reviewed: 06/17/2016 Elsevier Interactive Patient Education  [employer].     IF you received an x-ray today, you will receive an invoice  from [facility]. Please contact [facility] at [phone] with questions or concerns regarding your invoice.   IF you received labwork today, you will receive an invoice from [place] [facility]. Please contact [facility] at [phone] with questions or concerns regarding your invoice.   Our billing staff will not be able to assist you with questions regarding bills from these companies.  You will be contacted with the lab results as soon as they are available. The fastest way to get your results is to activate your My Chart account. Instructions are located on the last page of this paperwork. If you have not heard from [place] regarding the results in 2 weeks, please contact  this office.

## 2018-02-13 NOTE — Progress Notes (Signed)
MRN: [id number] DOB: Sep 02, 1946  Subjective:   Thomas Carlson is a 72 y.o. male presenting for several day history of right hip pain, radiates down to his knee, has associated intermittent tingling. Also reports several month history of ongoing right knee pain, saw an orthopedist in 10/2017, was started on meloxicam. He also has a history of right knee surgery in 2004 for torn meniscus, has had weak right knee since. Denies fall, trauma, saddle paraesthesia.  [name] has a current medication list which includes the following prescription(s): acyclovir, allopurinol, aspirin ec, atenolol-chlorthalidone, b complex vitamins, budesonide, vitamin d, indomethacin, lovastatin, meloxicam, metformin, multivitamin, fish oil, quinapril, and vitamin c. Also is allergic to penicillins and bactrim [sulfamethoxazole-trimethoprim].  Thomas Carlson  has a past medical history of Aortic valve sclerosis (08/13/2016), CAD (coronary artery disease) (2005), Cholelithiasis, Chronic kidney disease, stage 3, mod decreased GFR ([place]), Diabetes mellitus ([place]) (2010), Diverticulosis, DJD (degenerative joint disease), Genital herpes, Gout, Heart murmur, HLD (hyperlipidemia), HTN (hypertension), Obesity, and Seizures ([place]). Also  has a past surgical history that includes Appendectomy (1958); Hernia repair (1956); Subdural hematoma evacuation via craniotomy (1964); Coronary angioplasty with stent (2005); Closed reduction clavicle fracture (1955); Knee arthroscopy (2008); Colonoscopy (02/2008); and Colonoscopy with propofol (N/A, 11/10/2015).  Objective:   Vitals: BP (!) 150/79   Pulse 64   Temp 97.8 F (36.6 C) (Oral)   Resp 18   Ht 5' 7.5" (1.715 m)   SpO2 94%   BMI 39.19 kg/m   Physical Exam  Constitutional: He is oriented to person, place, and time. He appears well-developed and well-nourished.  Cardiovascular: Normal rate.  Pulmonary/Chest: Effort normal.  Musculoskeletal:       Right hip: He exhibits decreased range of  motion (walking a limp, flexion and extension) and tenderness (lateral hip). He exhibits no swelling, no crepitus, no deformity and no laceration.       Right knee: He exhibits decreased range of motion and swelling. He exhibits no effusion, no ecchymosis, no deformity, no erythema and normal patellar mobility. Tenderness found. Medial joint line tenderness noted.  Neurological: He is alert and oriented to person, place, and time.   Dg Knee Complete 4 Views Right  Result Date: 02/13/2018 CLINICAL DATA:  RIGHT knee pain. EXAM: RIGHT KNEE - COMPLETE 4+ VIEW COMPARISON:  03/18/2007 MRI FINDINGS: There are degenerative changes involving the MEDIAL at the tell femoral compartments. No acute fracture or subluxation. No joint effusion. IMPRESSION: No evidence for acute  abnormality.  Degenerative changes. Electronically Signed   By: Nolon Nations M.D.   On: 02/13/2018 15:27   Dg Hip Unilat W Or W/o Pelvis 2-3 Views Right  Result Date: 02/13/2018 CLINICAL DATA:  Right hip pain EXAM: DG HIP (WITH OR WITHOUT PELVIS) 2-3V RIGHT COMPARISON:  None. FINDINGS: There is no evidence of hip fracture or dislocation. There is no evidence of arthropathy or other focal bone abnormality. IMPRESSION: Negative. Electronically Signed   By: Nolon Nations M.D.   On: 02/13/2018 15:26     Assessment and Plan :   Osteoarthritis of right knee, unspecified osteoarthritis type  Right hip pain - Plan: DG HIP UNILAT W OR W/O PELVIS 2-3 VIEWS RIGHT  Acute pain of right knee - Plan: DG Knee Complete 4 Views Right  Gout, unspecified cause, unspecified chronicity, unspecified site - Plan: Uric acid  Patient may have symptoms of sciatica but x-rays do confirm osteoarthritis of medial compartment of his right knee.  We will manage this more aggressively with prednisone course.  Patient instructed to hold off on NSAIDs until he gets through his prednisone.  He will follow-up with his orthopedist in [place].  Jaynee Eagles,  PA-C [facility] [phone] 02/13/2018  2:43 PM

## 2018-02-13 NOTE — Telephone Encounter (Signed)
Pt. Reports he started having pain/numb feeling right hip to knee Wednesday. Has not had an injury. Denies back pain. No redness or swelling. Appointment made for today. Reason for Disposition . Numbness in a leg or foot (i.e., loss of sensation)  Answer Assessment - Initial Assessment Questions 1. ONSET: "When did the pain start?"      Started Wednesday 2. LOCATION: "Where is the pain located?"       Right leg from hip area to the knee 3. PAIN: "How bad is the pain?"    (Scale 1-10; or mild, moderate, severe)   -  MILD (1-3): doesn't interfere with normal activities    -  MODERATE (4-7): interferes with normal activities (e.g., work or school) or awakens from sleep, limping    -  SEVERE (8-10): excruciating pain, unable to do any normal activities, unable to walk     6 4. WORK OR EXERCISE: "Has there been any recent work or exercise that involved this part of the body?"      No 5. CAUSE: "What do you think is causing the leg pain?"     Unsure 6. OTHER SYMPTOMS: "Do you have any other symptoms?" (e.g., chest pain, back pain, breathing difficulty, swelling, rash, fever, numbness, weakness)     No 7. PREGNANCY: "Is there any chance you are pregnant?" "When was your last menstrual period?"     No  Protocols used: LEG PAIN-A-AH

## 2018-02-14 LAB — URIC ACID: URIC ACID: 7.5 mg/dL (ref 3.7–8.6)

## 2018-02-16 ENCOUNTER — Telehealth: Payer: Self-pay | Admitting: Family Medicine

## 2018-02-16 ENCOUNTER — Telehealth: Payer: Self-pay | Admitting: Gastroenterology

## 2018-02-16 DIAGNOSIS — M1710 Unilateral primary osteoarthritis, unspecified knee: Secondary | ICD-10-CM

## 2018-02-16 NOTE — Telephone Encounter (Signed)
Copied from [place] [id number]. Topic: Quick Communication - See Telephone Encounter >> Feb 16, 2018  9:24 AM Conception Chancy, NT wrote: CRM for notification. See Telephone encounter for: 02/16/18.  Patient is calling and states he was seen at [facility] on 02/13/18 due to office being closed. States they performed Xrays and he was told he may need a knee replacement. Patient states he was prescribed prednisone but states it is finished on 02/17/18. He states what is his next step.

## 2018-02-16 NOTE — Telephone Encounter (Signed)
Pt left vm for Ginger in regards to rx refill on rx desanite he states [employer] has reached out to [place] for refill as well and would authorize refill for 90 days he would like to speak to Ginger please call pt

## 2018-02-17 ENCOUNTER — Telehealth: Payer: Self-pay | Admitting: Family Medicine

## 2018-02-17 NOTE — Telephone Encounter (Signed)
Once he's done with prednisone course he may restart meloxicam. rec return to ortho for further evaluation of knee arthritis. Does he need referral?

## 2018-02-17 NOTE — Telephone Encounter (Signed)
Referral placed.

## 2018-02-17 NOTE — Telephone Encounter (Signed)
Spoke with pt relaying message per Dr. Darnell Level.  Pt verbalizes understanding.  Says he will contact ortho for an appt  but is requesting referral just in case he needs it.

## 2018-02-17 NOTE — Telephone Encounter (Signed)
Copied from [place] [id number]. Topic: General - Other >> Feb 17, 2018   11:32 AM Boyd Kerbs wrote: Reason for CRM: Pt. Is going to Knee specialist and has to have copy of his xray. Appt. Is 1:10 on Thursday 4/25   Please call pt and let know when he can pick it up

## 2018-02-17 NOTE — Telephone Encounter (Signed)
Please make disc.

## 2018-02-18 ENCOUNTER — Telehealth: Payer: Self-pay | Admitting: Gastroenterology

## 2018-02-18 ENCOUNTER — Encounter: Payer: Self-pay | Admitting: *Deleted

## 2018-02-18 ENCOUNTER — Telehealth: Payer: Self-pay | Admitting: Urgent Care

## 2018-02-18 ENCOUNTER — Encounter: Payer: Self-pay | Admitting: Gastroenterology

## 2018-02-18 NOTE — Telephone Encounter (Signed)
Copied from [place] [id number]. Topic: General - Other >> Feb 18, 2018 12:33 PM Lennox Solders wrote: Reason for CRM: pt saw mani on 02-13-18 and was given rx for prednisone. Pt has finished medication and had some relief. The prednisone was not 100 % per pt. Pt would like to know if mani would prescribe something else. Pt has an appt the ortho for his knee pain tomorrow . Pt is still having pain from buttock to toes on right leg. Pt is taking tylenol with no relief. [address] in [place]

## 2018-02-18 NOTE — Telephone Encounter (Signed)
Patient needs Budesonide refilled at [facility]. He takes 3 capsules by mouth at breakfast daily. He is out and needs this ASAP. Please call this in and call patient.

## 2018-02-18 NOTE — Telephone Encounter (Signed)
Message from pt re: additional pain meds - sent to [facility]

## 2018-02-19 ENCOUNTER — Other Ambulatory Visit: Payer: Self-pay

## 2018-02-19 DIAGNOSIS — M1711 Unilateral primary osteoarthritis, right knee: Secondary | ICD-10-CM | POA: Diagnosis not present

## 2018-02-19 DIAGNOSIS — M5136 Other intervertebral disc degeneration, lumbar region: Secondary | ICD-10-CM | POA: Diagnosis not present

## 2018-02-19 DIAGNOSIS — K52832 Lymphocytic colitis: Secondary | ICD-10-CM

## 2018-02-19 MED ORDER — BUDESONIDE 3 MG PO CPEP: ORAL_CAPSULE | ORAL | 1 refills | Status: DC

## 2018-02-19 NOTE — Telephone Encounter (Signed)
At this point we should defer to his orthopedist especially if he is seeing him today.  Prednisone is a strong anti-inflammatory but he may need a steroid injection which can be provided by his orthopedist today.  Please notify patient.

## 2018-02-19 NOTE — Telephone Encounter (Signed)
Spoke to pt and advised of Mani's message.

## 2018-02-23 NOTE — Telephone Encounter (Signed)
error 

## 2018-02-24 ENCOUNTER — Other Ambulatory Visit: Payer: Self-pay

## 2018-02-24 DIAGNOSIS — M5136 Other intervertebral disc degeneration, lumbar region: Secondary | ICD-10-CM | POA: Diagnosis not present

## 2018-02-25 ENCOUNTER — Other Ambulatory Visit: Payer: Self-pay

## 2018-02-25 DIAGNOSIS — K52832 Lymphocytic colitis: Secondary | ICD-10-CM

## 2018-02-25 MED ORDER — BUDESONIDE 3 MG PO CPEP: ORAL_CAPSULE | ORAL | 3 refills | Status: DC

## 2018-02-25 NOTE — Telephone Encounter (Signed)
Rx for budesonide has been sent to [facility] mail order per pt request.

## 2018-03-03 ENCOUNTER — Telehealth: Payer: Self-pay | Admitting: Family Medicine

## 2018-03-03 NOTE — Telephone Encounter (Signed)
Pt. Called about release form Dr. Darnell Level was to sign and send back to be approved and sent back to [facility] in [place]. He is needing this asap and asking if anyone else can help with this since Dr. Darnell Level is out of office until next week.   Please call pt. When completed so he can follow up with [facility]

## 2018-03-03 NOTE — Telephone Encounter (Signed)
Copied from [place] [id number]. Topic: Quick Communication - See Telephone Encounter >> Mar 03, 2018 10:04 AM Robina Ade, Helene Kelp D wrote: Patient called and said that the office was suppose to get a request for an injection for patient to have done at [facility] in [facility] for an approval by Dr. Danise Mina. Please call patient back about an update. CRM for notification. See Telephone encounter for: 03/03/18.

## 2018-03-03 NOTE — Telephone Encounter (Signed)
I spoke with pt; pt has not stopped taking ASA because [facility] will not schedule lumbar epidural steroid injection until the form is completed and faxed back to their office. Pt understands that a provider will review his chart and the form and then make a decision if it is appropriate for the form to be signed. Pt will wait for a cb and understands the cb will most likely not be today.The form is in the in box on Dr Josefine Class desk.

## 2018-03-04 NOTE — Telephone Encounter (Signed)
Form faxed

## 2018-03-04 NOTE — Telephone Encounter (Signed)
Form done.  Assuming there is no clinical change of which I am unaware (ex fever, etc), then should be okay to hold aspirin as listed on the form.  Thanks.

## 2018-03-10 ENCOUNTER — Ambulatory Visit: Payer: Medicare HMO

## 2018-03-10 DIAGNOSIS — E119 Type 2 diabetes mellitus without complications: Secondary | ICD-10-CM | POA: Diagnosis not present

## 2018-03-10 DIAGNOSIS — M5416 Radiculopathy, lumbar region: Secondary | ICD-10-CM | POA: Diagnosis not present

## 2018-03-28 HISTORY — PX: DECOMPRESSIVE LUMBAR LAMINECTOMY LEVEL 1: SHX5791

## 2018-04-01 DIAGNOSIS — M1711 Unilateral primary osteoarthritis, right knee: Secondary | ICD-10-CM | POA: Diagnosis not present

## 2018-04-02 DIAGNOSIS — M48061 Spinal stenosis, lumbar region without neurogenic claudication: Secondary | ICD-10-CM | POA: Diagnosis not present

## 2018-04-02 DIAGNOSIS — M5126 Other intervertebral disc displacement, lumbar region: Secondary | ICD-10-CM | POA: Diagnosis not present

## 2018-04-02 MED FILL — oxyCODONE HCL 10 MG TABS: 10 | 10 days supply | Qty: 60 | Fill #0

## 2018-04-06 DIAGNOSIS — I2510 Atherosclerotic heart disease of native coronary artery without angina pectoris: Secondary | ICD-10-CM | POA: Diagnosis not present

## 2018-04-06 DIAGNOSIS — Z880 Allergy status to penicillin: Secondary | ICD-10-CM | POA: Diagnosis not present

## 2018-04-06 DIAGNOSIS — E119 Type 2 diabetes mellitus without complications: Secondary | ICD-10-CM | POA: Diagnosis not present

## 2018-04-06 DIAGNOSIS — M5126 Other intervertebral disc displacement, lumbar region: Secondary | ICD-10-CM | POA: Diagnosis not present

## 2018-04-06 DIAGNOSIS — E1142 Type 2 diabetes mellitus with diabetic polyneuropathy: Secondary | ICD-10-CM | POA: Diagnosis not present

## 2018-04-06 DIAGNOSIS — M961 Postlaminectomy syndrome, not elsewhere classified: Secondary | ICD-10-CM | POA: Diagnosis not present

## 2018-04-06 DIAGNOSIS — M5136 Other intervertebral disc degeneration, lumbar region: Secondary | ICD-10-CM | POA: Diagnosis not present

## 2018-04-06 DIAGNOSIS — I10 Essential (primary) hypertension: Secondary | ICD-10-CM | POA: Diagnosis not present

## 2018-04-06 DIAGNOSIS — M5116 Intervertebral disc disorders with radiculopathy, lumbar region: Secondary | ICD-10-CM | POA: Diagnosis not present

## 2018-04-06 DIAGNOSIS — Z955 Presence of coronary angioplasty implant and graft: Secondary | ICD-10-CM | POA: Diagnosis not present

## 2018-04-06 DIAGNOSIS — E785 Hyperlipidemia, unspecified: Secondary | ICD-10-CM | POA: Diagnosis not present

## 2018-04-06 DIAGNOSIS — M48061 Spinal stenosis, lumbar region without neurogenic claudication: Secondary | ICD-10-CM | POA: Diagnosis not present

## 2018-04-06 DIAGNOSIS — G4733 Obstructive sleep apnea (adult) (pediatric): Secondary | ICD-10-CM | POA: Diagnosis not present

## 2018-04-06 MED FILL — oxyCODONE HCL 5 MG TABS: 5 | 5 days supply | Qty: 60 | Fill #0

## 2018-04-07 DIAGNOSIS — M48061 Spinal stenosis, lumbar region without neurogenic claudication: Secondary | ICD-10-CM | POA: Diagnosis not present

## 2018-04-07 DIAGNOSIS — G4733 Obstructive sleep apnea (adult) (pediatric): Secondary | ICD-10-CM | POA: Diagnosis not present

## 2018-04-07 DIAGNOSIS — E785 Hyperlipidemia, unspecified: Secondary | ICD-10-CM | POA: Diagnosis not present

## 2018-04-07 DIAGNOSIS — Z955 Presence of coronary angioplasty implant and graft: Secondary | ICD-10-CM | POA: Diagnosis not present

## 2018-04-07 DIAGNOSIS — I2510 Atherosclerotic heart disease of native coronary artery without angina pectoris: Secondary | ICD-10-CM | POA: Diagnosis not present

## 2018-04-07 DIAGNOSIS — I10 Essential (primary) hypertension: Secondary | ICD-10-CM | POA: Diagnosis not present

## 2018-04-07 DIAGNOSIS — E1142 Type 2 diabetes mellitus with diabetic polyneuropathy: Secondary | ICD-10-CM | POA: Diagnosis not present

## 2018-04-07 DIAGNOSIS — M5116 Intervertebral disc disorders with radiculopathy, lumbar region: Secondary | ICD-10-CM | POA: Diagnosis not present

## 2018-04-07 DIAGNOSIS — Z880 Allergy status to penicillin: Secondary | ICD-10-CM | POA: Diagnosis not present

## 2018-04-19 ENCOUNTER — Encounter: Payer: Self-pay | Admitting: Family Medicine

## 2018-04-20 DIAGNOSIS — M1711 Unilateral primary osteoarthritis, right knee: Secondary | ICD-10-CM | POA: Diagnosis not present

## 2018-04-24 ENCOUNTER — Encounter: Payer: Self-pay | Admitting: Family Medicine

## 2018-04-27 DIAGNOSIS — M1711 Unilateral primary osteoarthritis, right knee: Secondary | ICD-10-CM | POA: Diagnosis not present

## 2018-05-04 DIAGNOSIS — M1711 Unilateral primary osteoarthritis, right knee: Secondary | ICD-10-CM | POA: Diagnosis not present

## 2018-05-27 DIAGNOSIS — L814 Other melanin hyperpigmentation: Secondary | ICD-10-CM | POA: Diagnosis not present

## 2018-05-27 DIAGNOSIS — D229 Melanocytic nevi, unspecified: Secondary | ICD-10-CM | POA: Diagnosis not present

## 2018-05-27 DIAGNOSIS — D1801 Hemangioma of skin and subcutaneous tissue: Secondary | ICD-10-CM | POA: Diagnosis not present

## 2018-05-27 DIAGNOSIS — L570 Actinic keratosis: Secondary | ICD-10-CM | POA: Diagnosis not present

## 2018-05-27 DIAGNOSIS — L819 Disorder of pigmentation, unspecified: Secondary | ICD-10-CM | POA: Diagnosis not present

## 2018-05-27 DIAGNOSIS — C44529 Squamous cell carcinoma of skin of other part of trunk: Secondary | ICD-10-CM | POA: Diagnosis not present

## 2018-05-27 DIAGNOSIS — D485 Neoplasm of uncertain behavior of skin: Secondary | ICD-10-CM | POA: Diagnosis not present

## 2018-05-27 DIAGNOSIS — L821 Other seborrheic keratosis: Secondary | ICD-10-CM | POA: Diagnosis not present

## 2018-05-28 DIAGNOSIS — M1711 Unilateral primary osteoarthritis, right knee: Secondary | ICD-10-CM | POA: Diagnosis not present

## 2018-05-28 DIAGNOSIS — R600 Localized edema: Secondary | ICD-10-CM | POA: Diagnosis not present

## 2018-05-28 DIAGNOSIS — M25561 Pain in right knee: Secondary | ICD-10-CM | POA: Diagnosis not present

## 2018-06-15 DIAGNOSIS — D692 Other nonthrombocytopenic purpura: Secondary | ICD-10-CM | POA: Diagnosis not present

## 2018-06-15 LAB — CBC AND DIFFERENTIAL
HEMOGLOBIN: 12.7 — AB (ref 13.5–17.5)
PLATELETS: 228 (ref 150–399)
WBC: 10.1

## 2018-06-16 LAB — CBC AND DIFFERENTIAL
HEMOGLOBIN: 12.7 — AB (ref 13.5–17.5)
PLATELETS: 228 (ref 150–399)
WBC: 10.1

## 2018-06-24 ENCOUNTER — Ambulatory Visit: Payer: Medicare HMO | Admitting: Family Medicine

## 2018-07-09 ENCOUNTER — Telehealth: Payer: Self-pay | Admitting: Gastroenterology

## 2018-07-09 NOTE — Telephone Encounter (Signed)
PT is calling for rx refill budezinite 90 days refill to [facility] in [place]

## 2018-07-10 ENCOUNTER — Other Ambulatory Visit: Payer: Self-pay

## 2018-07-10 DIAGNOSIS — K52832 Lymphocytic colitis: Secondary | ICD-10-CM

## 2018-07-10 MED ORDER — BUDESONIDE 3 MG PO CPEP
ORAL_CAPSULE | ORAL | 3 refills | Status: DC
Start: 2018-07-10 — End: 2018-11-18

## 2018-07-10 MED FILL — BUDESONIDE 3 MG CPEP: 3 | 90 days supply | Qty: 270 | Fill #0

## 2018-07-10 NOTE — Telephone Encounter (Signed)
Refill for budesonide has been sent to pt's pharmacy per his request.

## 2018-07-15 ENCOUNTER — Ambulatory Visit (INDEPENDENT_AMBULATORY_CARE_PROVIDER_SITE_OTHER): Payer: Medicare HMO | Admitting: Family Medicine

## 2018-07-15 ENCOUNTER — Encounter: Payer: Self-pay | Admitting: Family Medicine

## 2018-07-15 VITALS — BP 128/80 | HR 69 | Temp 97.8°F | Ht 67.5 in | Wt 259.5 lb

## 2018-07-15 DIAGNOSIS — E118 Type 2 diabetes mellitus with unspecified complications: Secondary | ICD-10-CM | POA: Diagnosis not present

## 2018-07-15 DIAGNOSIS — Z23 Encounter for immunization: Secondary | ICD-10-CM

## 2018-07-15 DIAGNOSIS — R29898 Other symptoms and signs involving the musculoskeletal system: Secondary | ICD-10-CM | POA: Diagnosis not present

## 2018-07-15 DIAGNOSIS — M25561 Pain in right knee: Secondary | ICD-10-CM

## 2018-07-15 DIAGNOSIS — G8929 Other chronic pain: Secondary | ICD-10-CM | POA: Diagnosis not present

## 2018-07-15 DIAGNOSIS — K52832 Lymphocytic colitis: Secondary | ICD-10-CM

## 2018-07-15 DIAGNOSIS — E538 Deficiency of other specified B group vitamins: Secondary | ICD-10-CM | POA: Diagnosis not present

## 2018-07-15 NOTE — Progress Notes (Signed)
BP 128/80 (BP Location: Left Arm, Patient Position: Sitting, Cuff Size: Large)   Pulse 69   Temp 97.8 F (36.6 C) (Oral)   Ht 5' 7.5" (1.715 m)   Wt 259 lb 8 oz (117.7 kg)   SpO2 98%   BMI 40.04 kg/m    CC: knee pain, back pain Subjective:    Patient ID: Thomas Carlson, male    DOB: December 01, 1945, 72 y.o.   MRN: [id number]  HPI: Thomas Carlson is a 72 y.o. male presenting on 07/15/2018 for Follow-up (Here for knee and back pain f/u. )   Ongoing back and knee problems.  Has seen urgent care and ortho for this in the past, treated for medial OA (xray) with prednisone course. He also had steroid injection and synvisc injections without benefit. Most recently completing PT for knee and back. Able to come down the stairs ok, has to take 1 stair at a time when going upstairs. Bracing doesn't help. Considering knee surgery Mack Guise). He had R meniscal tear repair 2008.   Had lumbar surgery (decompressive lumbar laminectomy) 03/2018 - herniated L4/5 disc with free fragment over L4 Mack Guise). Since lumbar surgery, notes significant progressive tiredness and weakness of legs with ambulation >143ft. Prior exercise regimen was walking - no longer able to walk for exercise. Endorses progressive paresthesias of feet as well.   Persistent foot and ankle swelling since surgery.  Upcoming appt with Dr Ubaldo Glassing 07/28/2018 - last seen 2005.  cbg's elevated recently.  Denies chest pain, tightness, dyspnea, coughing or wheezing, palpitations, fevers/chills, nausea/vomiting, abd pain. Symptoms don't come on with exertion - no calf pain with ambulation.   Lymphocytic colitis - on budesonide QD PRN for this. Has recently been taking again.  Increased bruising of forearms over last 2 months.   Relevant past medical, surgical, family and social history reviewed and updated as indicated. Interim medical history since our last visit reviewed. Allergies and medications reviewed and updated. Outpatient  Medications Prior to Visit  Medication Sig Dispense Refill  . acyclovir (ZOVIRAX) 400 MG tablet Take 1 tablet (400 mg total) by mouth 2 (two) times daily. 180 tablet 3  . allopurinol (ZYLOPRIM) 100 MG tablet Take 1 tablet (100 mg total) by mouth daily. 90 tablet 3  . aspirin EC 81 MG tablet Take 81 mg by mouth daily.    Marland Kitchen atenolol-chlorthalidone (TENORETIC) 50-25 MG tablet Take 1 tablet by mouth daily. 90 tablet 3  . b complex vitamins tablet Take 1 tablet by mouth daily.    . budesonide (ENTOCORT EC) 3 MG 24 hr capsule Take 3 capsules at breakfast daily 270 capsule 3  . Cholecalciferol (VITAMIN D) 2000 units CAPS Take 1 capsule by mouth daily.    Marland Kitchen lovastatin (MEVACOR) 40 MG tablet Take 2 tablets (80 mg total) by mouth at bedtime. 180 tablet 3  . metFORMIN (GLUCOPHAGE) 500 MG tablet Take 1 tablet (500 mg total) by mouth daily with breakfast. 90 tablet 3  . Multiple Vitamin (MULTIVITAMIN) tablet Take 1 tablet by mouth daily.    . Omega-3 Fatty Acids (FISH OIL) 1360 MG CAPS Take 1 capsule by mouth daily.     . quinapril (ACCUPRIL) 20 MG tablet Take 1 tablet (20 mg total) by mouth at bedtime. 90 tablet 3  . vitamin C (ASCORBIC ACID) 500 MG tablet Take 500 mg by mouth daily.    . indomethacin (INDOCIN) 50 MG capsule Take 1 capsule (50 mg total) by mouth daily as needed (for gout flare-up).  30 capsule 0  . meloxicam (MOBIC) 7.5 MG tablet Take 7.5 mg by mouth daily.  0  . predniSONE (DELTASONE) 20 MG tablet Take 2 tablets daily with breakfast. 10 tablet 0   No facility-administered medications prior to visit.      Per HPI unless specifically indicated in ROS section below Review of Systems     Objective:    BP 128/80 (BP Location: Left Arm, Patient Position: Sitting, Cuff Size: Large)   Pulse 69   Temp 97.8 F (36.6 C) (Oral)   Ht 5' 7.5" (1.715 m)   Wt 259 lb 8 oz (117.7 kg)   SpO2 98%   BMI 40.04 kg/m   Wt Readings from Last 3 Encounters:  07/15/18 259 lb 8 oz (117.7 kg)  10/10/17  254 lb (115.2 kg)  09/29/17 252 lb (114.3 kg)    Physical Exam  Constitutional: He appears well-developed and well-nourished. No distress.  HENT:  Mouth/Throat: Oropharynx is clear and moist. No oropharyngeal exudate.  Cardiovascular: Normal rate, regular rhythm and normal heart sounds.  No murmur heard. Pulmonary/Chest: Effort normal and breath sounds normal. No respiratory distress. He has no wheezes.  Abdominal: Soft. Bowel sounds are normal. He exhibits no distension and no mass. There is no tenderness. There is no rebound and no guarding. No hernia.  Musculoskeletal: He exhibits edema (tr).  2+ DP bilaterally FROM at hips and knees Neg SLR bilaterally. No pain with int/ext rotation at hip. Neg FABER. No pain at SIJ, GTB or sciatic notch bilaterally.   Neurological: He is alert.  Mildly diminished strength BLE with hip flexion bilaterally otherwise 5/5 strength Sensation intact  Skin: Skin is warm.  Psychiatric: He has a normal mood and affect.  Nursing note and vitals reviewed.  Results for orders placed or performed in visit on 07/15/18  CBC and differential  Result Value Ref Range   Hemoglobin 12.7 (A) 13.5 - 17.5   Platelets 228 150 - 399   WBC 10.1   TSH  Result Value Ref Range   TSH 0.90 0.35 - 4.50 uIU/mL  Comprehensive metabolic panel  Result Value Ref Range   Sodium 142 135 - 145 mEq/L   Potassium 3.6 3.5 - 5.1 mEq/L   Chloride 102 96 - 112 mEq/L   CO2 29 19 - 32 mEq/L   Glucose, Bld 257 (H) 70 - 99 mg/dL   BUN 24 (H) 6 - 23 mg/dL   Creatinine, Ser 1.24 0.40 - 1.50 mg/dL   Total Bilirubin 0.4 0.2 - 1.2 mg/dL   Alkaline Phosphatase 61 39 - 117 U/L   AST 21 0 - 37 U/L   ALT 40 0 - 53 U/L   Total Protein 6.2 6.0 - 8.3 g/dL   Albumin 3.8 3.5 - 5.2 g/dL   Calcium 9.6 8.4 - 10.5 mg/dL   GFR 60.71 >60.00 mL/min  Hemoglobin A1c  Result Value Ref Range   Hgb A1c MFr Bld 9.0 (H) 4.6 - 6.5 %  CK  Result Value Ref Range   Total CK 201 7 - 232 U/L  Vitamin B12   Result Value Ref Range   Vitamin B-12 230 211 - 911 pg/mL       Assessment & Plan:   Problem List Items Addressed This Visit    Weakness of both lower extremities - Primary    Progressive, noted since lumbar decompression surgery. ?surgery related.  Consider neuro referral for further evaluation ?NCS (steroid myopathy) Known diabetic.  Check labs today -  A1c, B12, TSH, CK.  Good pulses - doubt arterial claudication.       Relevant Orders   TSH (Completed)   Comprehensive metabolic panel (Completed)   Hemoglobin A1c (Completed)   CK (Completed)   Vitamin B12 (Completed)   Right knee pain    Has had significant evaluation/treatment for this, considering replacement.       Lymphocytic colitis    He recently restarted budesonide oral steroid.       Low vitamin B12 level    He regularly takes vit b complex. Update b12 with paresthesia symptoms. Consider steroid injections if needed.       Relevant Orders   Vitamin B12 (Completed)   Diabetes mellitus type 2, controlled, with complications (HCC)    Chronic, no recent flare. Now on daily budesonide for lymphocytic colitis - update A1c. ?diabetic neuropathy contributing to leg symptoms.       Relevant Orders   Hemoglobin A1c (Completed)    Other Visit Diagnoses    Need for influenza vaccination       Relevant Orders   Flu Vaccine QUAD 36+ mos IM (Completed)       No orders of the defined types were placed in this encounter.  Orders Placed This Encounter  Procedures  . Flu Vaccine QUAD 36+ mos IM  . CBC and differential    This external order was created through the Results Console.  . TSH  . Comprehensive metabolic panel  . Hemoglobin A1c  . CK  . Vitamin B12    Follow up plan: No follow-ups on file.  Ria Bush, MD

## 2018-07-15 NOTE — Patient Instructions (Addendum)
Flu shot today Labs today. We will be in touch with results.

## 2018-07-16 ENCOUNTER — Other Ambulatory Visit: Payer: Self-pay | Admitting: Family Medicine

## 2018-07-16 ENCOUNTER — Encounter: Payer: Self-pay | Admitting: Family Medicine

## 2018-07-16 DIAGNOSIS — R29898 Other symptoms and signs involving the musculoskeletal system: Secondary | ICD-10-CM | POA: Insufficient documentation

## 2018-07-16 LAB — COMPREHENSIVE METABOLIC PANEL
ALT: 40 U/L (ref 0–53)
AST: 21 U/L (ref 0–37)
Albumin: 3.8 g/dL (ref 3.5–5.2)
Alkaline Phosphatase: 61 U/L (ref 39–117)
BUN: 24 mg/dL — AB (ref 6–23)
CALCIUM: 9.6 mg/dL (ref 8.4–10.5)
CHLORIDE: 102 meq/L (ref 96–112)
CO2: 29 meq/L (ref 19–32)
CREATININE: 1.24 mg/dL (ref 0.40–1.50)
GFR: 60.71 mL/min (ref >60.00)
Glucose, Bld: 257 mg/dL — ABNORMAL HIGH (ref 70–99)
POTASSIUM: 3.6 meq/L (ref 3.5–5.1)
SODIUM: 142 meq/L (ref 135–145)
Total Bilirubin: 0.4 mg/dL (ref 0.2–1.2)
Total Protein: 6.2 g/dL (ref 6.0–8.3)

## 2018-07-16 LAB — CK: CK TOTAL: 201 U/L (ref 7–232)

## 2018-07-16 LAB — TSH: TSH: 0.9 u[IU]/mL (ref 0.35–4.50)

## 2018-07-16 LAB — HEMOGLOBIN A1C: Hgb A1c MFr Bld: 9 % — ABNORMAL HIGH (ref 4.6–6.5)

## 2018-07-16 LAB — VITAMIN B12: Vitamin B-12: 230 pg/mL (ref 211–911)

## 2018-07-16 MED ORDER — METFORMIN HCL 500 MG PO TABS: 500.0000 mg | ORAL_TABLET | Freq: Three times a day (TID) | ORAL | 1 refills | Status: DC

## 2018-07-16 NOTE — Assessment & Plan Note (Signed)
Has had significant evaluation/treatment for this, considering replacement.

## 2018-07-16 NOTE — Assessment & Plan Note (Signed)
He recently restarted budesonide oral steroid.

## 2018-07-16 NOTE — Assessment & Plan Note (Addendum)
Progressive, noted since lumbar decompression surgery. ?surgery related.  Consider neuro referral for further evaluation ?NCS (steroid myopathy) Known diabetic.  Check labs today - A1c, B12, TSH, CK.  Good pulses - doubt arterial claudication.

## 2018-07-16 NOTE — Assessment & Plan Note (Addendum)
Chronic, no recent flare. Now on daily budesonide for lymphocytic colitis - update A1c. ?diabetic neuropathy contributing to leg symptoms.

## 2018-07-16 NOTE — Assessment & Plan Note (Signed)
He regularly takes vit b complex. Update b12 with paresthesia symptoms. Consider steroid injections if needed.

## 2018-07-22 ENCOUNTER — Ambulatory Visit (INDEPENDENT_AMBULATORY_CARE_PROVIDER_SITE_OTHER): Payer: Medicare HMO

## 2018-07-22 VITALS — Wt 262.8 lb

## 2018-07-22 DIAGNOSIS — R29898 Other symptoms and signs involving the musculoskeletal system: Secondary | ICD-10-CM

## 2018-07-22 DIAGNOSIS — E538 Deficiency of other specified B group vitamins: Secondary | ICD-10-CM

## 2018-07-22 LAB — POC URINALSYSI DIPSTICK (AUTOMATED)
Bilirubin, UA: NEGATIVE
GLUCOSE UA: POSITIVE — AB
KETONES UA: NEGATIVE
Leukocytes, UA: NEGATIVE
Nitrite, UA: NEGATIVE
Protein, UA: NEGATIVE
RBC UA: NEGATIVE
SPEC GRAV UA: 1.025 (ref 1.010–1.025)
Urobilinogen, UA: 0.2 E.U./dL
pH, UA: 6 (ref 5.0–8.0)

## 2018-07-22 MED ORDER — CYANOCOBALAMIN 1000 MCG/ML IJ SOLN
1000.0000 ug | Freq: Once | INTRAMUSCULAR | Status: AC
  Administered 2018-07-22: 1000 ug via INTRAMUSCULAR

## 2018-07-22 NOTE — Progress Notes (Signed)
Pt given 1st 1000mg /ml B12 injection in left deltoid. Tolerated well.  Pt fell 2 days ago and has skintears on his right arm and leg. I changed his bandages with neosporin and telfa pads.  Per Dr Danise Mina, I performed a UA and weighed the pt due to the swelling in his feet. Weight is up from 259.8 lbs on 07-15-18 to 262.12lbs.

## 2018-07-26 NOTE — Progress Notes (Signed)
Significant increase in pedal edema without endorsed chest pain or dyspnea Checked UA r/o proteinuria

## 2018-07-28 DIAGNOSIS — R600 Localized edema: Secondary | ICD-10-CM | POA: Insufficient documentation

## 2018-07-28 DIAGNOSIS — I209 Angina pectoris, unspecified: Secondary | ICD-10-CM | POA: Diagnosis not present

## 2018-07-28 DIAGNOSIS — I25118 Atherosclerotic heart disease of native coronary artery with other forms of angina pectoris: Secondary | ICD-10-CM | POA: Diagnosis not present

## 2018-07-28 DIAGNOSIS — M1711 Unilateral primary osteoarthritis, right knee: Secondary | ICD-10-CM | POA: Diagnosis not present

## 2018-07-28 DIAGNOSIS — I10 Essential (primary) hypertension: Secondary | ICD-10-CM | POA: Diagnosis not present

## 2018-07-31 ENCOUNTER — Telehealth: Payer: Self-pay | Admitting: Gastroenterology

## 2018-07-31 NOTE — Telephone Encounter (Signed)
Have the patient add a schedule dose of imodium to keep his diarrhea under control.

## 2018-07-31 NOTE — Telephone Encounter (Signed)
Pt is currently taking Budesonide 9mg  at breakfast daily as you directed. Pt stated the medication is no longer working for his Lymphocytic colitis. He has a follow up appt with you on 08/17/18. Nothing available sooner. Please let me know what he can do to help with the diarrhea until his appt. He stated his stools are like water.

## 2018-07-31 NOTE — Telephone Encounter (Signed)
PT  Is calling for Ginger regarding symptoms he is having

## 2018-07-31 NOTE — Telephone Encounter (Signed)
Pt.notified

## 2018-08-05 ENCOUNTER — Ambulatory Visit (INDEPENDENT_AMBULATORY_CARE_PROVIDER_SITE_OTHER): Payer: Medicare HMO | Admitting: *Deleted

## 2018-08-05 DIAGNOSIS — E538 Deficiency of other specified B group vitamins: Secondary | ICD-10-CM | POA: Diagnosis not present

## 2018-08-05 MED ORDER — CYANOCOBALAMIN 1000 MCG/ML IJ SOLN
1000.0000 ug | Freq: Once | INTRAMUSCULAR | Status: AC
  Administered 2018-08-05: 1000 ug via INTRAMUSCULAR

## 2018-08-05 NOTE — Progress Notes (Signed)
Per orders of Dr. Gutierrez, injection of b12 given by Zonie Crutcher H. Patient tolerated injection well.  

## 2018-08-11 ENCOUNTER — Telehealth: Payer: Self-pay

## 2018-08-11 NOTE — Telephone Encounter (Signed)
Spoke with pt regarding his diarrhea. We advised him to add imodium along with his Budesonide daily. He stated it slowed the diarrhea a bit but today when he had a bowel movement this morning and there was a lot of blood in the toilet. No abdominal pain, fever, nausea or vomiting. He has a follow up appt on Monday with you to discuss the budesonide no longer helping. Please advise on the blood.

## 2018-08-12 DIAGNOSIS — R600 Localized edema: Secondary | ICD-10-CM | POA: Diagnosis not present

## 2018-08-12 DIAGNOSIS — I25118 Atherosclerotic heart disease of native coronary artery with other forms of angina pectoris: Secondary | ICD-10-CM | POA: Diagnosis not present

## 2018-08-13 NOTE — Telephone Encounter (Signed)
Let the patient know that the colitis should not cause bleeding and that should be investigated with a repeat colonoscopy. The last one in 2017 showed diverticulosis. This and hemorrhoids can bleed. If he is bleeding a lot then go to ER.

## 2018-08-13 NOTE — Telephone Encounter (Signed)
Spoke with pt regarding bleeding. Stated he had stopped then restarted yesterday. Still no abdominal pain associated with it. Will wait until appt on Monday, 08/17/18 to discuss.

## 2018-08-14 DIAGNOSIS — I25118 Atherosclerotic heart disease of native coronary artery with other forms of angina pectoris: Secondary | ICD-10-CM | POA: Diagnosis not present

## 2018-08-14 DIAGNOSIS — R600 Localized edema: Secondary | ICD-10-CM | POA: Diagnosis not present

## 2018-08-14 DIAGNOSIS — I10 Essential (primary) hypertension: Secondary | ICD-10-CM | POA: Diagnosis not present

## 2018-08-17 ENCOUNTER — Other Ambulatory Visit: Payer: Self-pay

## 2018-08-17 ENCOUNTER — Encounter: Payer: Self-pay | Admitting: Gastroenterology

## 2018-08-17 ENCOUNTER — Encounter (INDEPENDENT_AMBULATORY_CARE_PROVIDER_SITE_OTHER): Payer: Self-pay

## 2018-08-17 ENCOUNTER — Ambulatory Visit: Payer: Medicare HMO | Admitting: Gastroenterology

## 2018-08-17 VITALS — BP 87/46 | HR 76 | Ht 67.5 in | Wt 251.0 lb

## 2018-08-17 DIAGNOSIS — M549 Dorsalgia, unspecified: Secondary | ICD-10-CM | POA: Insufficient documentation

## 2018-08-17 DIAGNOSIS — M5136 Other intervertebral disc degeneration, lumbar region: Secondary | ICD-10-CM | POA: Insufficient documentation

## 2018-08-17 DIAGNOSIS — K921 Melena: Secondary | ICD-10-CM

## 2018-08-17 DIAGNOSIS — K52832 Lymphocytic colitis: Secondary | ICD-10-CM

## 2018-08-17 DIAGNOSIS — G4733 Obstructive sleep apnea (adult) (pediatric): Secondary | ICD-10-CM | POA: Insufficient documentation

## 2018-08-17 MED ORDER — NA SULFATE-K SULFATE-MG SULF 17.5-3.13-1.6 GM/177ML PO SOLN: 1.0000 | ORAL | 0 refills | Status: DC

## 2018-08-17 NOTE — Progress Notes (Signed)
Primary Care Physician: Ria Bush, MD  Primary Gastroenterologist:  Dr. [name]  Chief Complaint  Patient presents with  . Follow up lymphocytic colitis    HPI: Thomas Carlson is a 72 y.o. male here for follow-up.  The patient has a recent call to our office at the beginning of October for watery diarrhea.  The patient has a history of lymphocytic colitis.  The patient was told at that time to supplement his budesonide with Imodium.  The patient then called and stated that he was having painless hematochezia.  Due to the the hematochezia patient was set up for an office appointment today. The patient reports that his rectal bleeding is not clots but pink-tinged clear fluid with all bowel movements.  The patient also reports that he has had increased lower extremity edema and is seeing dermatology for skin lesions.  He also reports weakness in his legs ever since having surgery on his back.  The patient has been trying Imodium but has not been taking it regularly and reports continued diarrhea because of it. Current Outpatient Medications  Medication Sig Dispense Refill  . acyclovir (ZOVIRAX) 400 MG tablet Take 1 tablet (400 mg total) by mouth 2 (two) times daily. 180 tablet 3  . allopurinol (ZYLOPRIM) 100 MG tablet Take 1 tablet (100 mg total) by mouth daily. 90 tablet 3  . aspirin EC 81 MG tablet Take 81 mg by mouth daily.    Marland Kitchen atenolol-chlorthalidone (TENORETIC) 50-25 MG tablet Take 1 tablet by mouth daily. 90 tablet 3  . b complex vitamins tablet Take 1 tablet by mouth daily.    . Biotin 1000 MCG tablet Take by mouth.    . budesonide (ENTOCORT EC) 3 MG 24 hr capsule Take 3 capsules at breakfast daily 270 capsule 3  . Cholecalciferol (VITAMIN D) 2000 units CAPS Take 1 capsule by mouth daily.    . cyanocobalamin (,VITAMIN B-12,) 1000 MCG/ML injection Inject 1 mL (1,000 mcg total) into the muscle every 30 (thirty) days. 1 mL 0  . furosemide (LASIX) 20 MG tablet   11  .  lovastatin (MEVACOR) 40 MG tablet Take 2 tablets (80 mg total) by mouth at bedtime. 180 tablet 3  . meloxicam (MOBIC) 7.5 MG tablet     . metFORMIN (GLUCOPHAGE) 500 MG tablet Take 1 tablet (500 mg total) by mouth 3 (three) times daily before meals. 270 tablet 1  . Multiple Vitamin (MULTIVITAMIN) tablet Take 1 tablet by mouth daily.    . quinapril (ACCUPRIL) 20 MG tablet Take 1 tablet (20 mg total) by mouth at bedtime. 90 tablet 3  . vitamin C (ASCORBIC ACID) 500 MG tablet Take 500 mg by mouth daily.    . Omega-3 Fatty Acids (FISH OIL) 1360 MG CAPS Take 1 capsule by mouth daily.      No current facility-administered medications for this visit.     Allergies as of 08/17/2018 - Review Complete 08/17/2018  Allergen Reaction Noted  . Penicillins Hives 06/18/2012  . Bactrim [sulfamethoxazole-trimethoprim] Rash 05/19/2013    ROS:  General: Negative for anorexia, weight loss, fever, chills, fatigue, weakness. ENT: Negative for hoarseness, difficulty swallowing , nasal congestion. CV: Negative for chest pain, angina, palpitations, dyspnea on exertion, peripheral edema.  Respiratory: Negative for dyspnea at rest, dyspnea on exertion, cough, sputum, wheezing.  GI: See history of present illness. GU:  Negative for dysuria, hematuria, urinary incontinence, urinary frequency, nocturnal urination.  Endo: Negative for unusual weight change.    Physical  Examination:   BP (!) 87/46   Pulse 76   Ht 5' 7.5" (1.715 m)   Wt 251 lb (113.9 kg)   BMI 38.73 kg/m   General: Well-nourished, well-developed in no acute distress.  Eyes: No icterus. Conjunctivae pink. Mouth: Oropharyngeal mucosa moist and pink , no lesions erythema or exudate. Lungs: Clear to auscultation bilaterally. Non-labored. Heart: Regular rate and rhythm, no murmurs rubs or gallops.  Abdomen: Bowel sounds are normal, nontender, nondistended, no hepatosplenomegaly or masses, no abdominal bruits or hernia , no rebound or guarding.     Extremities: No lower extremity edema. No clubbing or deformities. Neuro: Alert and oriented x 3.  Grossly intact. Skin: Warm and dry, no jaundice.   Psych: Alert and cooperative, normal mood and affect.  Labs:    Imaging Studies: No results found.  Assessment and Plan:   Thomas Carlson is a 72 y.o. y/o male who has a history of lymphocytic colitis and has been on budesonide without relief of the diarrhea recently.  The patient has also had rectal bleeding with pink-tinged fluid with every bowel movement.  I have requested that the patient get a CBC today and start on a regular dose of Imodium multiple times a day.  He will also be set up for a colonoscopy to look for a source of his rectal bleeding.I have discussed risks & benefits which include, but are not limited to, bleeding, infection, perforation & drug reaction.  The patient agrees with this plan & written consent will be obtained.       [name], MD. Marval Regal   Note: This dictation was prepared with Dragon dictation along with smaller phrase technology. Any transcriptional errors that result from this process are unintentional.

## 2018-08-18 ENCOUNTER — Telehealth: Payer: Self-pay | Admitting: Gastroenterology

## 2018-08-18 LAB — CBC WITH DIFFERENTIAL/PLATELET
BASOS ABS: 0 10*3/uL (ref 0.0–0.2)
Basos: 0 %
EOS (ABSOLUTE): 0 10*3/uL (ref 0.0–0.4)
EOS: 0 %
HEMOGLOBIN: 8.6 g/dL — AB (ref 13.0–17.7)
Hematocrit: 25.6 % — ABNORMAL LOW (ref 37.5–51.0)
IMMATURE GRANULOCYTES: 1 %
Immature Grans (Abs): 0.1 10*3/uL (ref 0.0–0.1)
Lymphocytes Absolute: 1.6 10*3/uL (ref 0.7–3.1)
Lymphs: 16 %
MCH: 33 pg (ref 26.6–33.0)
MCHC: 33.6 g/dL (ref 31.5–35.7)
MCV: 98 fL — ABNORMAL HIGH (ref 79–97)
MONOCYTES: 8 %
Monocytes Absolute: 0.8 10*3/uL (ref 0.1–0.9)
NRBC: 1 % — AB (ref 0–0)
Neutrophils Absolute: 7.6 10*3/uL — ABNORMAL HIGH (ref 1.4–7.0)
Neutrophils: 75 %
PLATELETS: 256 10*3/uL (ref 150–450)
RBC: 2.61 x10E6/uL — AB (ref 4.14–5.80)
RDW: 14.6 % (ref 12.3–15.4)
WBC: 10.2 10*3/uL (ref 3.4–10.8)

## 2018-08-18 NOTE — Telephone Encounter (Signed)
Pt rescheduled his colonoscopy from 09/04/18 to 08/24/18.

## 2018-08-18 NOTE — Telephone Encounter (Signed)
Pt is calling to reschedule his procedure for 11/08  To a sooner apt

## 2018-08-19 ENCOUNTER — Other Ambulatory Visit: Payer: Self-pay

## 2018-08-19 ENCOUNTER — Encounter: Payer: Self-pay | Admitting: Anesthesiology

## 2018-08-19 ENCOUNTER — Encounter: Payer: Self-pay | Admitting: *Deleted

## 2018-08-21 NOTE — Discharge Instructions (Signed)
General Anesthesia, Adult, Care After °These instructions provide you with information about caring for yourself after your procedure. Your health care provider may also give you more specific instructions. Your treatment has been planned according to current medical practices, but problems sometimes occur. Call your health care provider if you have any problems or questions after your procedure. °What can I expect after the procedure? °After the procedure, it is common to have: °· Vomiting. °· A sore throat. °· Mental slowness. ° °It is common to feel: °· Nauseous. °· Cold or shivery. °· Sleepy. °· Tired. °· Sore or achy, even in parts of your body where you did not have surgery. ° °Follow these instructions at home: °For at least 24 hours after the procedure: °· Do not: °? Participate in activities where you could fall or become injured. °? Drive. °? Use heavy machinery. °? Drink alcohol. °? Take sleeping pills or medicines that cause drowsiness. °? Make important decisions or sign legal documents. °? Take care of children on your own. °· Rest. °Eating and drinking °· If you vomit, drink water, juice, or soup when you can drink without vomiting. °· Drink enough fluid to keep your urine clear or pale yellow. °· Make sure you have little or no nausea before eating solid foods. °· Follow the diet recommended by your health care provider. °General instructions °· Have a responsible adult stay with you until you are awake and alert. °· Return to your normal activities as told by your health care provider. Ask your health care provider what activities are safe for you. °· Take over-the-counter and prescription medicines only as told by your health care provider. °· If you smoke, do not smoke without supervision. °· Keep all follow-up visits as told by your health care provider. This is important. °Contact a health care provider if: °· You continue to have nausea or vomiting at home, and medicines are not helpful. °· You  cannot drink fluids or start eating again. °· You cannot urinate after 8-12 hours. °· You develop a skin rash. °· You have fever. °· You have increasing redness at the site of your procedure. °Get help right away if: °· You have difficulty breathing. °· You have chest pain. °· You have unexpected bleeding. °· You feel that you are having a life-threatening or urgent problem. °This information is not intended to replace advice given to you by your health care provider. Make sure you discuss any questions you have with your health care provider. °Document Released: 01/20/2001 Document Revised: 03/18/2016 Document Reviewed: 09/28/2015 °[employer] Interactive Patient Education © 2018 [employer] ° °

## 2018-08-23 ENCOUNTER — Inpatient Hospital Stay
Admission: EM | Admit: 2018-08-23 | Discharge: 2018-08-26 | DRG: 682 | Disposition: A | Discharge status: Home / Self Care | Payer: Medicare HMO | Attending: Internal Medicine | Admitting: Internal Medicine

## 2018-08-23 ENCOUNTER — Emergency Department: Payer: Medicare HMO

## 2018-08-23 ENCOUNTER — Other Ambulatory Visit: Payer: Self-pay

## 2018-08-23 DIAGNOSIS — E876 Hypokalemia: Secondary | ICD-10-CM | POA: Diagnosis not present

## 2018-08-23 DIAGNOSIS — K2971 Gastritis, unspecified, with bleeding: Secondary | ICD-10-CM | POA: Diagnosis present

## 2018-08-23 DIAGNOSIS — Z7982 Long term (current) use of aspirin: Secondary | ICD-10-CM | POA: Diagnosis not present

## 2018-08-23 DIAGNOSIS — K5730 Diverticulosis of large intestine without perforation or abscess without bleeding: Secondary | ICD-10-CM | POA: Diagnosis not present

## 2018-08-23 DIAGNOSIS — Z791 Long term (current) use of non-steroidal anti-inflammatories (NSAID): Secondary | ICD-10-CM

## 2018-08-23 DIAGNOSIS — E1122 Type 2 diabetes mellitus with diabetic chronic kidney disease: Secondary | ICD-10-CM | POA: Diagnosis not present

## 2018-08-23 DIAGNOSIS — I129 Hypertensive chronic kidney disease with stage 1 through stage 4 chronic kidney disease, or unspecified chronic kidney disease: Secondary | ICD-10-CM | POA: Diagnosis present

## 2018-08-23 DIAGNOSIS — E872 Acidosis: Secondary | ICD-10-CM | POA: Diagnosis present

## 2018-08-23 DIAGNOSIS — Z955 Presence of coronary angioplasty implant and graft: Secondary | ICD-10-CM

## 2018-08-23 DIAGNOSIS — K5731 Diverticulosis of large intestine without perforation or abscess with bleeding: Secondary | ICD-10-CM | POA: Diagnosis not present

## 2018-08-23 DIAGNOSIS — N179 Acute kidney failure, unspecified: Secondary | ICD-10-CM | POA: Diagnosis present

## 2018-08-23 DIAGNOSIS — Z6834 Body mass index (BMI) 34.0-34.9, adult: Secondary | ICD-10-CM

## 2018-08-23 DIAGNOSIS — Z79899 Other long term (current) drug therapy: Secondary | ICD-10-CM | POA: Diagnosis not present

## 2018-08-23 DIAGNOSIS — N183 Chronic kidney disease, stage 3 (moderate): Secondary | ICD-10-CM | POA: Diagnosis present

## 2018-08-23 DIAGNOSIS — I2510 Atherosclerotic heart disease of native coronary artery without angina pectoris: Secondary | ICD-10-CM | POA: Diagnosis present

## 2018-08-23 DIAGNOSIS — E669 Obesity, unspecified: Secondary | ICD-10-CM | POA: Diagnosis present

## 2018-08-23 DIAGNOSIS — K625 Hemorrhage of anus and rectum: Secondary | ICD-10-CM | POA: Diagnosis not present

## 2018-08-23 DIAGNOSIS — K648 Other hemorrhoids: Secondary | ICD-10-CM | POA: Diagnosis not present

## 2018-08-23 DIAGNOSIS — Z881 Allergy status to other antibiotic agents status: Secondary | ICD-10-CM | POA: Diagnosis not present

## 2018-08-23 DIAGNOSIS — K2970 Gastritis, unspecified, without bleeding: Secondary | ICD-10-CM | POA: Diagnosis not present

## 2018-08-23 DIAGNOSIS — E1140 Type 2 diabetes mellitus with diabetic neuropathy, unspecified: Secondary | ICD-10-CM | POA: Diagnosis present

## 2018-08-23 DIAGNOSIS — I959 Hypotension, unspecified: Secondary | ICD-10-CM | POA: Diagnosis not present

## 2018-08-23 DIAGNOSIS — K921 Melena: Secondary | ICD-10-CM | POA: Diagnosis not present

## 2018-08-23 DIAGNOSIS — M109 Gout, unspecified: Secondary | ICD-10-CM | POA: Diagnosis present

## 2018-08-23 DIAGNOSIS — K644 Residual hemorrhoidal skin tags: Secondary | ICD-10-CM | POA: Diagnosis present

## 2018-08-23 DIAGNOSIS — K635 Polyp of colon: Secondary | ICD-10-CM | POA: Diagnosis present

## 2018-08-23 DIAGNOSIS — K922 Gastrointestinal hemorrhage, unspecified: Secondary | ICD-10-CM

## 2018-08-23 DIAGNOSIS — D126 Benign neoplasm of colon, unspecified: Secondary | ICD-10-CM | POA: Diagnosis not present

## 2018-08-23 DIAGNOSIS — K259 Gastric ulcer, unspecified as acute or chronic, without hemorrhage or perforation: Secondary | ICD-10-CM | POA: Diagnosis not present

## 2018-08-23 DIAGNOSIS — Z880 Allergy status to penicillin: Secondary | ICD-10-CM

## 2018-08-23 DIAGNOSIS — Z888 Allergy status to other drugs, medicaments and biological substances status: Secondary | ICD-10-CM | POA: Diagnosis not present

## 2018-08-23 DIAGNOSIS — K5790 Diverticulosis of intestine, part unspecified, without perforation or abscess without bleeding: Secondary | ICD-10-CM | POA: Diagnosis not present

## 2018-08-23 DIAGNOSIS — E1165 Type 2 diabetes mellitus with hyperglycemia: Secondary | ICD-10-CM | POA: Diagnosis present

## 2018-08-23 DIAGNOSIS — E785 Hyperlipidemia, unspecified: Secondary | ICD-10-CM | POA: Diagnosis present

## 2018-08-23 DIAGNOSIS — K52832 Lymphocytic colitis: Secondary | ICD-10-CM

## 2018-08-23 DIAGNOSIS — G4733 Obstructive sleep apnea (adult) (pediatric): Secondary | ICD-10-CM | POA: Diagnosis not present

## 2018-08-23 DIAGNOSIS — D62 Acute posthemorrhagic anemia: Secondary | ICD-10-CM | POA: Diagnosis not present

## 2018-08-23 DIAGNOSIS — K2980 Duodenitis without bleeding: Secondary | ICD-10-CM | POA: Diagnosis not present

## 2018-08-23 DIAGNOSIS — E860 Dehydration: Secondary | ICD-10-CM | POA: Diagnosis present

## 2018-08-23 DIAGNOSIS — D123 Benign neoplasm of transverse colon: Secondary | ICD-10-CM | POA: Diagnosis not present

## 2018-08-23 DIAGNOSIS — K2981 Duodenitis with bleeding: Secondary | ICD-10-CM | POA: Diagnosis not present

## 2018-08-23 DIAGNOSIS — I358 Other nonrheumatic aortic valve disorders: Secondary | ICD-10-CM | POA: Diagnosis present

## 2018-08-23 DIAGNOSIS — K3189 Other diseases of stomach and duodenum: Secondary | ICD-10-CM | POA: Diagnosis not present

## 2018-08-23 LAB — CBC
HCT: 26 % — ABNORMAL LOW (ref 39.0–52.0)
HEMOGLOBIN: 8.9 g/dL — AB (ref 13.0–17.0)
MCH: 33.6 pg (ref 26.0–34.0)
MCHC: 34.2 g/dL (ref 30.0–36.0)
MCV: 98.1 fL (ref 80.0–100.0)
PLATELETS: 338 10*3/uL (ref 150–400)
RBC: 2.65 MIL/uL — ABNORMAL LOW (ref 4.22–5.81)
RDW: 16.8 % — ABNORMAL HIGH (ref 11.5–15.5)
WBC: 9.4 10*3/uL (ref 4.0–10.5)
nRBC: 0.3 % — ABNORMAL HIGH (ref 0.0–0.2)

## 2018-08-23 LAB — PROTIME-INR
INR: 1.07
PROTHROMBIN TIME: 13.8 s (ref 11.4–15.2)

## 2018-08-23 LAB — IRON AND TIBC
Iron: 60 ug/dL (ref 45–182)
SATURATION RATIOS: 19 % (ref 17.9–39.5)
TIBC: 322 ug/dL (ref 250–450)
UIBC: 262 ug/dL

## 2018-08-23 LAB — LACTIC ACID, PLASMA
LACTIC ACID, VENOUS: 2.6 mmol/L — AB (ref 0.5–1.9)
LACTIC ACID, VENOUS: 1.4 mmol/L (ref 0.5–1.9)

## 2018-08-23 LAB — BASIC METABOLIC PANEL
Anion gap: 18 — ABNORMAL HIGH (ref 5–15)
BUN: 37 mg/dL — AB (ref 8–23)
CALCIUM: 7.9 mg/dL — AB (ref 8.9–10.3)
CO2: 27 mmol/L (ref 22–32)
Chloride: 95 mmol/L — ABNORMAL LOW (ref 98–111)
Creatinine, Ser: 2.41 mg/dL — ABNORMAL HIGH (ref 0.61–1.24)
GFR calc Af Amer: 29 mL/min — ABNORMAL LOW (ref >60)
GFR calc non Af Amer: 25 mL/min — ABNORMAL LOW (ref >60)
GLUCOSE: 167 mg/dL — AB (ref 70–99)
Potassium: 2.2 mmol/L — CL (ref 3.5–5.1)
Sodium: 140 mmol/L (ref 135–145)

## 2018-08-23 LAB — GLUCOSE, CAPILLARY
GLUCOSE-CAPILLARY: 118 mg/dL — AB (ref 70–99)
Glucose-Capillary: 142 mg/dL — ABNORMAL HIGH (ref 70–99)

## 2018-08-23 LAB — TYPE AND SCREEN
ABO/RH(D): O POS
Antibody Screen: NEGATIVE

## 2018-08-23 LAB — FOLATE: Folate: 44 ng/mL (ref >5.9)

## 2018-08-23 LAB — MAGNESIUM: Magnesium: 1.7 mg/dL (ref 1.7–2.4)

## 2018-08-23 LAB — FERRITIN: FERRITIN: 99 ng/mL (ref 24–336)

## 2018-08-23 MED ORDER — SODIUM CHLORIDE 0.9 % IV BOLUS
1000.0000 mL | Freq: Once | INTRAVENOUS | Status: AC
  Administered 2018-08-23: 1000 mL via INTRAVENOUS

## 2018-08-23 MED ORDER — ALLOPURINOL 100 MG PO TABS
100.0000 mg | ORAL_TABLET | Freq: Every day | ORAL | Status: DC
  Administered 2018-08-24 – 2018-08-25 (×2): 100 mg via ORAL
  Filled 2018-08-23 (×4): qty 1

## 2018-08-23 MED ORDER — ONDANSETRON HCL 4 MG/2ML IJ SOLN: 4.0000 mg | Freq: Four times a day (QID) | INTRAMUSCULAR | Status: DC | PRN

## 2018-08-23 MED ORDER — ACYCLOVIR 200 MG PO CAPS
400.0000 mg | ORAL_CAPSULE | Freq: Two times a day (BID) | ORAL | Status: DC
  Administered 2018-08-23 – 2018-08-25 (×5): 400 mg via ORAL
  Filled 2018-08-23 (×7): qty 2

## 2018-08-23 MED ORDER — POTASSIUM CHLORIDE CRYS ER 20 MEQ PO TBCR
40.0000 meq | EXTENDED_RELEASE_TABLET | Freq: Three times a day (TID) | ORAL | Status: AC
  Administered 2018-08-23 – 2018-08-24 (×4): 40 meq via ORAL
  Filled 2018-08-23 (×4): qty 2

## 2018-08-23 MED ORDER — ACYCLOVIR 400 MG PO TABS
400.0000 mg | ORAL_TABLET | Freq: Two times a day (BID) | ORAL | Status: DC
  Filled 2018-08-23: qty 1

## 2018-08-23 MED ORDER — SODIUM CHLORIDE 0.9 % IV SOLN
INTRAVENOUS | Status: DC
  Administered 2018-08-24: 08:00:00 via INTRAVENOUS

## 2018-08-23 MED ORDER — ATENOLOL 25 MG PO TABS
25.0000 mg | ORAL_TABLET | Freq: Every day | ORAL | Status: DC
  Administered 2018-08-24: 25 mg via ORAL
  Filled 2018-08-23: qty 1

## 2018-08-23 MED ORDER — ACETAMINOPHEN 650 MG RE SUPP: 650.0000 mg | Freq: Four times a day (QID) | RECTAL | Status: DC | PRN

## 2018-08-23 MED ORDER — ACETAMINOPHEN 325 MG PO TABS: 650.0000 mg | ORAL_TABLET | Freq: Four times a day (QID) | ORAL | Status: DC | PRN

## 2018-08-23 MED ORDER — BUDESONIDE 3 MG PO CPEP
9.0000 mg | ORAL_CAPSULE | Freq: Every day | ORAL | Status: DC
  Administered 2018-08-24 – 2018-08-25 (×2): 9 mg via ORAL
  Filled 2018-08-23 (×3): qty 3

## 2018-08-23 MED ORDER — ONDANSETRON HCL 4 MG PO TABS: 4.0000 mg | ORAL_TABLET | Freq: Four times a day (QID) | ORAL | Status: DC | PRN

## 2018-08-23 MED ORDER — INSULIN ASPART 100 UNIT/ML ~~LOC~~ SOLN
0.0000 [IU] | Freq: Three times a day (TID) | SUBCUTANEOUS | Status: DC
  Administered 2018-08-24 (×2): 1 [IU] via SUBCUTANEOUS
  Administered 2018-08-24: 3 [IU] via SUBCUTANEOUS
  Administered 2018-08-25: 5 [IU] via SUBCUTANEOUS
  Administered 2018-08-25: 1 [IU] via SUBCUTANEOUS
  Filled 2018-08-23 (×4): qty 1

## 2018-08-23 MED ORDER — POTASSIUM CHLORIDE 10 MEQ/100ML IV SOLN
10.0000 meq | Freq: Once | INTRAVENOUS | Status: AC
  Administered 2018-08-23: 10 meq via INTRAVENOUS
  Filled 2018-08-23: qty 100

## 2018-08-23 MED ORDER — INSULIN ASPART 100 UNIT/ML ~~LOC~~ SOLN: 0.0000 [IU] | Freq: Every day | SUBCUTANEOUS | Status: DC

## 2018-08-23 NOTE — ED Provider Notes (Signed)
[facility] Emergency Department Provider Note ____________________________________________   First MD Initiated Contact with Patient 08/23/18 1329     (approximate)  I have reviewed the triage vital signs and the nursing notes.   HISTORY  Chief Complaint GI Bleeding and Dizziness    HPI Thomas Carlson is a 72 y.o. male with PMH as noted below including history of diverticulosis but no prior GI bleed who presents with weakness and dizziness over the last several days, gradual onset, worsening course, and associated with blood in his stool.  The patient reports that he has had blood in the stool for about the last month occurring during almost every bowel movement and the color of red wine.  He denies fever or chills, chest pain, shortness of breath, or abdominal pain.   Past Medical History:  Diagnosis Date  . Aortic valve sclerosis 08/13/2016   Sclerosis without stenosis by [place] (08/2016)  . CAD (coronary artery disease) 2005   s/p stent ([place])  . Cholelithiasis    by CT  . Chronic kidney disease, stage 3, mod decreased GFR (HCC)   . Diabetes mellitus ([place]) 2010  . Diverticulosis    by CT  . DJD (degenerative joint disease)    knee  . Genital herpes   . Gout   . Heart murmur    followed by PCP  . HLD (hyperlipidemia)   . HTN (hypertension)   . Knee pain, right    "needs replacement"  . Neuropathy    feet  . Obesity   . Seizures ([place])    x1 - after craniotomy (1960s)  . Weakness of both legs    "since back surgery"    Patient Active Problem List   Diagnosis Date Noted  . Backache 08/17/2018  . Degeneration of lumbar intervertebral disc 08/17/2018  . Obstructive sleep apnea syndrome 08/17/2018  . Edema of both legs 07/28/2018  . Weakness of both lower extremities 07/16/2018  . Lymphocytic colitis 08/13/2016  . Right knee pain 08/13/2016  . Aortic valve sclerosis 08/13/2016  . Hearing loss of left ear due to cerumen impaction  08/13/2016  . Low vitamin B12 level 08/09/2016  . Diverticulosis of large intestine without diverticulitis   . Medicare annual wellness visit, subsequent 07/13/2015  . Advanced care planning/counseling discussion 07/13/2015  . Lumbar disc herniation with radiculopathy 05/26/2014  . Vitamin D deficiency 07/14/2013  . Healthcare maintenance 07/10/2012  . CKD stage 3 due to type 2 diabetes mellitus ([place])   . DJD (degenerative joint disease)   . Diabetes mellitus type 2, controlled, with complications ([place])   . HTN (hypertension)   . HLD (hyperlipidemia)   . CAD (coronary artery disease)   . Gout   . Severe obesity (BMI 35.0-39.9) with comorbidity [facility])     Past Surgical History:  Procedure Laterality Date  . APPENDECTOMY  1958  . CLOSED REDUCTION CLAVICLE FRACTURE  1955  . COLONOSCOPY  02/2008   int hemorrhoids, diverticula (Dr. Allen Norris)  . COLONOSCOPY WITH PROPOFOL N/A 11/10/2015   diverticulosis, path with microscopic colitis [name], MD)  . CORONARY ANGIOPLASTY WITH STENT PLACEMENT  2005   stent 2005  . DECOMPRESSIVE LUMBAR LAMINECTOMY LEVEL 1  03/2018   herniated L4/5 disc R with free fargment over L4 s/p surgery L4 (Krasinksi)  . HERNIA REPAIR  1956  . KNEE ARTHROSCOPY  2008   torn meniscus  . SUBDURAL HEMATOMA EVACUATION VIA CRANIOTOMY  1964   hit in helmet by baseball  Prior to Admission medications   Medication Sig Start Date End Date Taking? Authorizing Provider  acyclovir (ZOVIRAX) 400 MG tablet Take 1 tablet (400 mg total) by mouth 2 (two) times daily. 10/10/17  Yes Ria Bush, MD  allopurinol (ZYLOPRIM) 100 MG tablet Take 1 tablet (100 mg total) by mouth daily. 10/10/17  Yes Ria Bush, MD  aspirin EC 81 MG tablet Take 81 mg by mouth daily.   Yes [provider]  atenolol-chlorthalidone (TENORETIC) 50-25 MG tablet Take 1 tablet by mouth daily. 10/10/17  Yes Ria Bush, MD  b complex vitamins tablet Take 1 tablet by mouth daily.   Yes  [provider]  Biotin 1000 MCG tablet Take 1,000 mcg by mouth daily.    Yes [provider]  budesonide (ENTOCORT EC) 3 MG 24 hr capsule Take 3 capsules at breakfast daily 07/10/18  Yes [name], MD  Cholecalciferol (VITAMIN D) 2000 units CAPS Take 1 capsule by mouth daily.   Yes [provider]  cyanocobalamin (,VITAMIN B-12,) 1000 MCG/ML injection Inject 1 mL (1,000 mcg total) into the muscle every 30 (thirty) days. 07/16/18  Yes Ria Bush, MD  furosemide (LASIX) 20 MG tablet Take 20-40 mg by mouth daily.  08/14/18  Yes [provider]  lovastatin (MEVACOR) 40 MG tablet Take 2 tablets (80 mg total) by mouth at bedtime. 10/10/17  Yes Ria Bush, MD  meloxicam (MOBIC) 7.5 MG tablet Take 7.5 mg by mouth 2 (two) times daily.  08/15/18  Yes [provider]  metFORMIN (GLUCOPHAGE) 500 MG tablet Take 1 tablet (500 mg total) by mouth 3 (three) times daily before meals. 07/16/18  Yes Ria Bush, MD  Multiple Vitamin (MULTIVITAMIN) tablet Take 1 tablet by mouth daily.   Yes [provider]  quinapril (ACCUPRIL) 20 MG tablet Take 1 tablet (20 mg total) by mouth at bedtime. 10/10/17  Yes Ria Bush, MD  vitamin C (ASCORBIC ACID) 500 MG tablet Take 500 mg by mouth daily.   Yes [provider]  Na Sulfate-K Sulfate-Mg Sulf (SUPREP BOWEL PREP KIT) 17.5-3.13-1.6 GM/177ML SOLN Take 1 kit by mouth as directed. Patient not taking: Reported on 08/23/2018 08/17/18   [name], MD    Allergies Gabapentin; Penicillins; and Bactrim [sulfamethoxazole-trimethoprim]  Family History  Problem Relation Age of Onset  . Diabetes Father 42  . Coronary artery disease Sister        catheterizations  . COPD Brother   . Stroke Brother   . Hypertension Brother   . Cancer Neg Hx     Social History Social History   Tobacco Use  . Smoking status: Never Smoker  . Smokeless tobacco: Never Used  Substance Use Topics  .  Alcohol use: No    Alcohol/week: 0.0 standard drinks  . Drug use: No    Review of Systems  Constitutional: No fever.  Positive for weakness. Eyes: No redness. ENT: No sore throat. Cardiovascular: Denies chest pain. Respiratory: Denies shortness of breath. Gastrointestinal: No vomiting.  Genitourinary: Negative for dysuria.  Musculoskeletal: Negative for back pain. Skin: Negative for rash. Neurological: Negative for headache.   ____________________________________________   PHYSICAL EXAM:  VITAL SIGNS: ED Triage Vitals  Enc Vitals Group     BP 08/23/18 1318 (!) 77/40     Pulse Rate 08/23/18 1318 (!) 115     Resp 08/23/18 1318 20     Temp 08/23/18 1318 98.3 F (36.8 C)     Temp Source 08/23/18 1318 Oral  SpO2 08/23/18 1318 97 %     Weight 08/23/18 1324 242 lb 11.6 oz (110.1 kg)     Height 08/23/18 1324 '5\' 8"'$  (1.727 m)     Head Circumference --      Peak Flow --      Pain Score 08/23/18 1318 8     Pain Loc --      Pain Edu? --      Excl. in [place]? --     Constitutional: Alert and oriented.  Somewhat weak appearing but no acute distress. Eyes: Conjunctivae are normal.  EOMI. Head: Atraumatic. Nose: No congestion/rhinnorhea. Mouth/Throat: Mucous membranes are dry.   Neck: Normal range of motion.  Cardiovascular: Tachycardic, regular rhythm. Grossly normal heart sounds.  Good peripheral circulation. Respiratory: Normal respiratory effort.  No retractions. Lungs CTAB. Gastrointestinal: Soft and nontender. No distention.  Genitourinary: No flank tenderness. Musculoskeletal: No lower extremity edema.  Extremities warm and well perfused.  Neurologic:  Normal speech and language. No gross focal neurologic deficits are appreciated.  Skin:  Skin is warm and dry. No rash noted. Psychiatric: Mood and affect are normal. Speech and behavior are normal.  ____________________________________________   LABS (all labs ordered are listed, but only abnormal results are  displayed)  Labs Reviewed  BASIC METABOLIC PANEL - Abnormal; Notable for the following components:      Result Value   Potassium 2.2 (*)    Chloride 95 (*)    Glucose, Bld 167 (*)    BUN 37 (*)    Creatinine, Ser 2.41 (*)    Calcium 7.9 (*)    GFR calc non Af Amer 25 (*)    GFR calc Af Amer 29 (*)    Anion gap 18 (*)    All other components within normal limits  CBC - Abnormal; Notable for the following components:   RBC 2.65 (*)    Hemoglobin 8.9 (*)    HCT 26.0 (*)    RDW 16.8 (*)    nRBC 0.3 (*)    All other components within normal limits  LACTIC ACID, PLASMA - Abnormal; Notable for the following components:   Lactic Acid, Venous 2.6 (*)    All other components within normal limits  PROTIME-INR  URINALYSIS, COMPLETE (UACMP) WITH MICROSCOPIC  LACTIC ACID, PLASMA  CBG MONITORING, ED  TYPE AND SCREEN   ____________________________________________  EKG  ED ECG REPORT I, Arta Silence, the attending physician, personally viewed and interpreted this ECG.  Date: 08/23/2018 EKG Time: 1323 Rate: 108 Rhythm: Sinus tachycardia QRS Axis: normal Intervals: normal ST/T Wave abnormalities: Nonspecific ST abnormalities Narrative Interpretation: no evidence of acute ischemia  ____________________________________________  RADIOLOGY  CXR: No focal infiltrate  ____________________________________________   PROCEDURES  Procedure(s) performed: No  Procedures  Critical Care performed: Yes  CRITICAL CARE Performed by: Arta Silence   Total critical care time: 30 minutes  Critical care time was exclusive of separately billable procedures and treating other patients.  Critical care was necessary to treat or prevent imminent or life-threatening deterioration.  Critical care was time spent personally by me on the following activities: development of treatment plan with patient and/or surrogate as well as nursing, discussions with consultants, evaluation of  patient's response to treatment, examination of patient, obtaining history from patient or surrogate, ordering and performing treatments and interventions, ordering and review of laboratory studies, ordering and review of radiographic studies, pulse oximetry and re-evaluation of patient's condition.  ____________________________________________   INITIAL IMPRESSION / ASSESSMENT AND PLAN / ED COURSE  Pertinent labs & imaging results that were available during my care of the patient were reviewed by me and considered in my medical decision making (see chart for details).  72 year old male with PMH as noted above presents with generalized weakness and lightheadedness over the last several days, as well as with blood in his stool over the last month.  Patient is actually scheduled for an outpatient colonoscopy tomorrow and was doing the prep today.  On exam, the patient is somewhat weak appearing but alert.  He is hypotensive and somewhat tachycardic.  His other vital signs are normal.  His abdomen is soft nontender.  The remainder of the exam is as described above.  Overall I am most concerned for acute blood loss related to lower GI bleed although the differential also includes dehydration, infection/sepsis, or metabolic etiology.  We will obtain labs, give fluids, and reassess.  If significant acute blood loss I will consult GI.  I anticipate admission.  ----------------------------------------- 3:10 PM on 08/23/2018 -----------------------------------------  The patient's work-up reveals anemia but no significant change from 6 days ago, no indication for transfusion at this time.  The vital signs have significantly improved after fluids.  The work-up also reveals hypokalemia and acute renal insufficiency as well as elevated lactate.  We will continue fluids and potassium.  The patient will require admission.  I signed him out to the hospitalist Dr.  Brett Albino.  ____________________________________________   FINAL CLINICAL IMPRESSION(S) / ED DIAGNOSES  Final diagnoses:  Hypotension  Hypokalemia  Hypotension, unspecified hypotension type  Gastrointestinal hemorrhage, unspecified gastrointestinal hemorrhage type      NEW MEDICATIONS STARTED DURING THIS VISIT:  New Prescriptions   No medications on file     Note:  This document was prepared using Dragon voice recognition software and may include unintentional dictation errors.    Arta Silence, MD 08/23/18 1510

## 2018-08-23 NOTE — Progress Notes (Signed)
Family Meeting Note  Advance Directive:yes  Today a meeting took place with the Patient and spouse.  Patient is able to participate.   The following clinical team members were present during this meeting:MD  The following were discussed:Patient's diagnosis: , Patient's progosis: Unable to determine and Goals for treatment: Full Code   Discussed code status with patient.  He states that his chronic medical conditions are under pretty good control.  He is mostly dealing with back pain and knee pain.  He would like to be full code, as he feels like he has a pretty good quality of life.  Additional follow-up to be provided: prn  Time spent during discussion:20 minutes  Evette Doffing, MD

## 2018-08-23 NOTE — H&P (Addendum)
[place] at  NAME: Thomas Carlson    MR#:  [id number]  DATE OF BIRTH:  1946/07/01  DATE OF ADMISSION:  08/23/2018  PRIMARY CARE PHYSICIAN: Ria Bush, MD   REQUESTING/REFERRING PHYSICIAN: Arta Silence, MD  CHIEF COMPLAINT:   Chief Complaint  Patient presents with  . GI Bleeding  . Dizziness    HISTORY OF PRESENT ILLNESS:  Thomas Carlson  is a 72 y.o. male with a known history of hypertension, hyperlipidemia, CKD 3, type 2 diabetes, CAD status post stent, seizures, obesity who presented to the ED with lower extremity weakness and inability to walk since this morning.  He states that he had back surgery in June and has had some weakness since then, but the lower extremity weakness was much worse this morning.  He has been treated for lower extremity edema with Lasix 40 mg p.o. daily over the last week.  He has never been on Lasix before.  Additionally, he has had bright red blood per rectum for the last couple of weeks.  He spoke with his gastroenterologist, who scheduled him for a colonoscopy tomorrow.  He denies any chest pain, shortness of breath, palpitations.  In the ED, labs were significant for K 2.2, creatinine 2.41, lactic acid 2.6, hemoglobin 8.9.  Chest x-ray negative.  Due to his hypokalemia and acute renal failure, hospitalists were called for admission.  PAST MEDICAL HISTORY:   Past Medical History:  Diagnosis Date  . Aortic valve sclerosis 08/13/2016   Sclerosis without stenosis by [place] (08/2016)  . CAD (coronary artery disease) 2005   s/p stent ([place])  . Cholelithiasis    by CT  . Chronic kidney disease, stage 3, mod decreased GFR (HCC)   . Diabetes mellitus ([place]) 2010  . Diverticulosis    by CT  . DJD (degenerative joint disease)    knee  . Genital herpes   . Gout   . Heart murmur    followed by PCP  . HLD (hyperlipidemia)   . HTN (hypertension)   . Knee pain, right    "needs replacement"    . Neuropathy    feet  . Obesity   . Seizures ([place])    x1 - after craniotomy (1960s)  . Weakness of both legs    "since back surgery"    PAST SURGICAL HISTORY:   Past Surgical History:  Procedure Laterality Date  . APPENDECTOMY  1958  . CLOSED REDUCTION CLAVICLE FRACTURE  1955  . COLONOSCOPY  02/2008   int hemorrhoids, diverticula (Dr. Allen Norris)  . COLONOSCOPY WITH PROPOFOL N/A 11/10/2015   diverticulosis, path with microscopic colitis [name], MD)  . CORONARY ANGIOPLASTY WITH STENT PLACEMENT  2005   stent 2005  . DECOMPRESSIVE LUMBAR LAMINECTOMY LEVEL 1  03/2018   herniated L4/5 disc R with free fargment over L4 s/p surgery L4 ([place]inksi)  . HERNIA REPAIR  1956  . KNEE ARTHROSCOPY  2008   torn meniscus  . SUBDURAL HEMATOMA EVACUATION VIA CRANIOTOMY  1964   hit in helmet by baseball    SOCIAL HISTORY:   Social History   Tobacco Use  . Smoking status: Never Smoker  . Smokeless tobacco: Never Used  Substance Use Topics  . Alcohol use: No    Alcohol/week: 0.0 standard drinks    FAMILY HISTORY:   Family History  Problem Relation Age of Onset  . Diabetes Father 40  . Coronary artery disease Sister  catheterizations  . COPD Brother   . Stroke Brother   . Hypertension Brother   . Cancer Neg Hx     DRUG ALLERGIES:   Allergies  Allergen Reactions  . Gabapentin   . Penicillins Hives    Has patient had a PCN reaction causing immediate rash, facial/tongue/throat swelling, SOB or lightheadedness with hypotension: Unknown Has patient had a PCN reaction causing severe rash involving mucus membranes or skin necrosis: Unknown Has patient had a PCN reaction that required hospitalization: Unknown Has patient had a PCN reaction occurring within the last 10 years: Unknown If all of the above answers are "NO", then may proceed with Cephalosporin use.   . Bactrim [Sulfamethoxazole-Trimethoprim] Rash    Diffuse drug reaction - maculopapular rash    REVIEW OF  SYSTEMS:   Review of Systems  Constitutional: Negative for fever.  HENT: Negative for congestion and sore throat.   Eyes: Negative for blurred vision and double vision.  Respiratory: Negative for cough and shortness of breath.   Cardiovascular: Positive for leg swelling. Negative for chest pain and palpitations.  Gastrointestinal: Positive for blood in stool and diarrhea. Negative for abdominal pain, constipation, nausea and vomiting.  Genitourinary: Negative for dysuria and frequency.  Musculoskeletal: Positive for back pain and joint pain. Negative for falls and neck pain.  Neurological: Positive for weakness. Negative for dizziness, sensory change, focal weakness and headaches.  Psychiatric/Behavioral: Negative for depression. The patient is not nervous/anxious.     MEDICATIONS AT HOME:   Prior to Admission medications   Medication Sig Start Date End Date Taking? Authorizing Provider  acyclovir (ZOVIRAX) 400 MG tablet Take 1 tablet (400 mg total) by mouth 2 (two) times daily. 10/10/17  Yes Ria Bush, MD  allopurinol (ZYLOPRIM) 100 MG tablet Take 1 tablet (100 mg total) by mouth daily. 10/10/17  Yes Ria Bush, MD  aspirin EC 81 MG tablet Take 81 mg by mouth daily.   Yes [provider]  atenolol-chlorthalidone (TENORETIC) 50-25 MG tablet Take 1 tablet by mouth daily. 10/10/17  Yes Ria Bush, MD  b complex vitamins tablet Take 1 tablet by mouth daily.   Yes [provider]  Biotin 1000 MCG tablet Take 1,000 mcg by mouth daily.    Yes [provider]  budesonide (ENTOCORT EC) 3 MG 24 hr capsule Take 3 capsules at breakfast daily 07/10/18  Yes [name], MD  Cholecalciferol (VITAMIN D) 2000 units CAPS Take 1 capsule by mouth daily.   Yes [provider]  cyanocobalamin (,VITAMIN B-12,) 1000 MCG/ML injection Inject 1 mL (1,000 mcg total) into the muscle every 30 (thirty) days. 07/16/18  Yes Ria Bush, MD  furosemide  (LASIX) 20 MG tablet Take 20-40 mg by mouth daily.  08/14/18  Yes [provider]  lovastatin (MEVACOR) 40 MG tablet Take 2 tablets (80 mg total) by mouth at bedtime. 10/10/17  Yes Ria Bush, MD  meloxicam (MOBIC) 7.5 MG tablet Take 7.5 mg by mouth 2 (two) times daily.  08/15/18  Yes [provider]  metFORMIN (GLUCOPHAGE) 500 MG tablet Take 1 tablet (500 mg total) by mouth 3 (three) times daily before meals. 07/16/18  Yes Ria Bush, MD  Multiple Vitamin (MULTIVITAMIN) tablet Take 1 tablet by mouth daily.   Yes [provider]  quinapril (ACCUPRIL) 20 MG tablet Take 1 tablet (20 mg total) by mouth at bedtime. 10/10/17  Yes Ria Bush, MD  vitamin C (ASCORBIC ACID) 500 MG tablet Take 500 mg by mouth daily.  Yes [provider]  Na Sulfate-K Sulfate-Mg Sulf (SUPREP BOWEL PREP KIT) 17.5-3.13-1.6 GM/177ML SOLN Take 1 kit by mouth as directed. Patient not taking: Reported on 08/23/2018 08/17/18   [name], MD      VITAL SIGNS:  Blood pressure (!) 77/40, pulse (!) 115, temperature 98.3 F (36.8 C), temperature source Oral, resp. rate 20, height _0  (1.727 m), weight 110.1 kg, SpO2 97 %.  PHYSICAL EXAMINATION:  Physical Exam  GENERAL:  72 y.o.-year-old patient lying in the bed with no acute distress.  EYES: Pupils equal, round, reactive to light and accommodation. No scleral icterus. Extraocular muscles intact.  HEENT: Head atraumatic, normocephalic. Oropharynx and nasopharynx clear.  NECK:  Supple, no jugular venous distention. No thyroid enlargement, no tenderness.  LUNGS: Normal breath sounds bilaterally, no wheezing, rales,rhonchi or crepitation. No use of accessory muscles of respiration.  CARDIOVASCULAR: RRR, S1, S2 normal. No rubs or gallops. II/VI systolic heart murmur. ABDOMEN: Soft, nontender, nondistended. Bowel sounds present. No organomegaly or mass.  EXTREMITIES: +trace pedal edema, cyanosis, or clubbing.  NEUROLOGIC:  Cranial nerves II through XII are intact. +global weakness. Sensation intact. Gait not checked.  PSYCHIATRIC: The patient is alert and oriented x 3.  SKIN: No obvious rash, lesion, or ulcer.   LABORATORY PANEL:   CBC Recent Labs  Lab 08/23/18 1338  WBC 9.4  HGB 8.9*  HCT 26.0*  PLT 338   ------------------------------------------------------------------------------------------------------------------  Chemistries  Recent Labs  Lab 08/23/18 1338  NA 140  K 2.2*  CL 95*  CO2 27  GLUCOSE 167*  BUN 37*  CREATININE 2.41*  CALCIUM 7.9*   ------------------------------------------------------------------------------------------------------------------  Cardiac Enzymes No results for input(s): TROPONINI in the last 168 hours. ------------------------------------------------------------------------------------------------------------------  RADIOLOGY:  Dg Chest Port 1 View  Result Date: 08/23/2018 CLINICAL DATA:  Low blood pressure for the past 2-3 days. EXAM: PORTABLE CHEST 1 VIEW COMPARISON:  None. FINDINGS: Portable AP upright view. Mild cardiac enlargement with minimal aortic atherosclerosis. Low lung volumes without alveolar consolidation or edema. No acute osseous abnormality. IMPRESSION: Cardiomegaly with minimal aortic atherosclerosis. No active pulmonary disease. Electronically Signed   By: Ashley Royalty M.D.   On: 08/23/2018 14:09      IMPRESSION AND PLAN:   Acute renal failure in CKD 3- likely related to hypotension and dehydration in the setting of recent initiation of Lasix over the last week.  ACE inhibitor and Meloxicam also likely contributing. -S/p 2 L normal saline bolus in the ED -Will place on gentle fluids -Hold lasix, quinapril, chlorthalidone, and meloxicam -Recheck creatinine in the morning  GI bleeding- has had bright red blood per rectum over the last couple of weeks.  Follows with Dr. Allen Norris as an outpatient.  Scheduled for outpatient colonoscopy  tomorrow. Hgb 8.9 (baseline 12-14) -Will discuss with GI to see if they would like to do his colonoscopy while he is inpatient -Hold aspirin and anticoagulation for GI bleeding  Hypokalemia- K 2.2.  Likely related to Lasix use. -Replete -Check magnesium  Lactic acidosis- likely related to dehydration, also takes metformin as an outpatient. No signs of sepsis.  -Fluids -Trend lactic acid  Hypotension- BPs improved s/p fluid bolus. Likely due to combination of hypokalemia, hypotension, and drop in Hgb from 12 to 8.9. -Holding lasix, quinapril, chlorthalidone -Decrease home atenolol dose to 49m qd  Lower extremity edema- had recent ECHO 08/12/18 that showed normal EF and G1DD -Ted hose and elevate legs -Hold lasix  Type 2 diabetes- hyperglycemic in the ED -Sensitive SSI  DVT prophylaxis- SCDs   All the records are reviewed and case discussed with ED provider. Management plans discussed with the patient, family and they are in agreement.  CODE STATUS: Full  TOTAL TIME TAKING CARE OF THIS PATIENT: 45 minutes.    Berna Spare Mayo M.D on 08/23/2018 at 3:36 PM  Between 7am to 6pm - Pager - [phone]  After 6pm go to [email or web address] - Proofreader  [facility]  Office  [phone]  CC: Primary care physician; Ria Bush, MD   Note: This dictation was prepared with Dragon dictation along with smaller phrase technology. Any transcriptional errors that result from this process are unintentional.

## 2018-08-23 NOTE — ED Notes (Signed)
Report to [place], [place]

## 2018-08-23 NOTE — Progress Notes (Signed)
Went to ER to pick up patient to be admitted to room 158. Tele and IV fluids started. A&O x4. Placed on isolation. Urinal at bedside. Reviewed orders and POC.

## 2018-08-23 NOTE — ED Triage Notes (Addendum)
Pt here with gi bleeding for the past month, was seen last week by GI and scheduled for a colonoscopy tomorrow, pt has been having increased weakness and dizziness, pt reports his bp was low in the office as well

## 2018-08-23 NOTE — Progress Notes (Signed)
   08/23/18 1945  Clinical Encounter Type  Visited With Patient  Visit Type Initial;Spiritual support  Referral From Nurse  Consult/Referral To Chaplain  Spiritual Encounters  Spiritual Needs Emotional;Grief support  CH entered room after following contact procedures (PPE). Pt was alert and awake upon arrival. Laying on his left side with head of hospital bed raised several inches. Television was on. Patient was pleasant and talkative. [name] built rapport with patient. PT has served with chaplains while working at [employer]. Pt desires to complete AD. Informed patient that it can be completed tomorrow. No further needs were requested by patient. Follow up needed.

## 2018-08-24 ENCOUNTER — Ambulatory Visit: Admission: RE | Admit: 2018-08-24 | Payer: Medicare HMO | Source: Ambulatory Visit | Admitting: Gastroenterology

## 2018-08-24 DIAGNOSIS — K922 Gastrointestinal hemorrhage, unspecified: Secondary | ICD-10-CM

## 2018-08-24 HISTORY — DX: Other symptoms and signs involving the musculoskeletal system: R29.898

## 2018-08-24 HISTORY — DX: Polyneuropathy, unspecified: G62.9

## 2018-08-24 HISTORY — DX: Pain in right knee: M25.561

## 2018-08-24 LAB — BASIC METABOLIC PANEL
ANION GAP: 13 (ref 5–15)
Anion gap: 11 (ref 5–15)
BUN: 31 mg/dL — ABNORMAL HIGH (ref 8–23)
BUN: 21 mg/dL (ref 8–23)
CALCIUM: 7.1 mg/dL — AB (ref 8.9–10.3)
CALCIUM: 7.3 mg/dL — AB (ref 8.9–10.3)
CO2: 28 mmol/L (ref 22–32)
CO2: 24 mmol/L (ref 22–32)
Chloride: 99 mmol/L (ref 98–111)
Chloride: 102 mmol/L (ref 98–111)
Creatinine, Ser: 1.81 mg/dL — ABNORMAL HIGH (ref 0.61–1.24)
Creatinine, Ser: 1.21 mg/dL (ref 0.61–1.24)
GFR calc Af Amer: >60 mL/min (ref >60)
GFR calc non Af Amer: 58 mL/min — ABNORMAL LOW (ref >60)
GFR, EST AFRICAN AMERICAN: 41 mL/min — AB (ref >60)
GFR, EST NON AFRICAN AMERICAN: 35 mL/min — AB (ref >60)
GLUCOSE: 197 mg/dL — AB (ref 70–99)
Glucose, Bld: 133 mg/dL — ABNORMAL HIGH (ref 70–99)
Potassium: 2.1 mmol/L — CL (ref 3.5–5.1)
Potassium: 3.4 mmol/L — ABNORMAL LOW (ref 3.5–5.1)
SODIUM: 140 mmol/L (ref 135–145)
Sodium: 137 mmol/L (ref 135–145)

## 2018-08-24 LAB — GLUCOSE, CAPILLARY
GLUCOSE-CAPILLARY: 132 mg/dL — AB (ref 70–99)
Glucose-Capillary: 133 mg/dL — ABNORMAL HIGH (ref 70–99)
Glucose-Capillary: 202 mg/dL — ABNORMAL HIGH (ref 70–99)
Glucose-Capillary: 158 mg/dL — ABNORMAL HIGH (ref 70–99)

## 2018-08-24 LAB — VITAMIN B12: Vitamin B-12: 1037 pg/mL — ABNORMAL HIGH (ref 180–914)

## 2018-08-24 LAB — CBC
HCT: 24.1 % — ABNORMAL LOW (ref 39.0–52.0)
Hemoglobin: 8 g/dL — ABNORMAL LOW (ref 13.0–17.0)
MCH: 33.6 pg (ref 26.0–34.0)
MCHC: 33.2 g/dL (ref 30.0–36.0)
MCV: 101.3 fL — ABNORMAL HIGH (ref 80.0–100.0)
PLATELETS: 283 10*3/uL (ref 150–400)
RBC: 2.38 MIL/uL — AB (ref 4.22–5.81)
RDW: 17 % — ABNORMAL HIGH (ref 11.5–15.5)
WBC: 7.6 10*3/uL (ref 4.0–10.5)
nRBC: 0 % (ref 0.0–0.2)

## 2018-08-24 LAB — PHOSPHORUS: PHOSPHORUS: 2.2 mg/dL — AB (ref 2.5–4.6)

## 2018-08-24 LAB — C-REACTIVE PROTEIN: CRP: 1.3 mg/dL — ABNORMAL HIGH (ref <1.0)

## 2018-08-24 SURGERY — COLONOSCOPY WITH PROPOFOL
Anesthesia: Choice

## 2018-08-24 MED ORDER — PEG 3350-KCL-NA BICARB-NACL 420 G PO SOLR
4000.0000 mL | Freq: Once | ORAL | Status: AC
  Administered 2018-08-25: 4000 mL via ORAL
  Filled 2018-08-24: qty 4000

## 2018-08-24 MED ORDER — POTASSIUM & SODIUM PHOSPHATES 280-160-250 MG PO PACK
1.0000 | PACK | Freq: Three times a day (TID) | ORAL | Status: AC
  Administered 2018-08-24 (×3): 1 via ORAL
  Filled 2018-08-24 (×3): qty 1

## 2018-08-24 MED ORDER — POTASSIUM CHLORIDE 10 MEQ/100ML IV SOLN
10.0000 meq | INTRAVENOUS | Status: AC
  Administered 2018-08-24 (×6): 10 meq via INTRAVENOUS
  Filled 2018-08-24 (×6): qty 100

## 2018-08-24 MED ORDER — SODIUM CHLORIDE 0.9 % IV SOLN
510.0000 mg | Freq: Once | INTRAVENOUS | Status: AC
  Administered 2018-08-24: 510 mg via INTRAVENOUS
  Filled 2018-08-24: qty 17

## 2018-08-24 MED ORDER — MAGNESIUM SULFATE 2 GM/50ML IV SOLN
2.0000 g | Freq: Once | INTRAVENOUS | Status: AC
  Administered 2018-08-24: 2 g via INTRAVENOUS
  Filled 2018-08-24: qty 50

## 2018-08-24 NOTE — Progress Notes (Signed)
Notified Dr. Aliene Altes that lab called with a potassium of 2.1. MD acknowledged and to place orders.

## 2018-08-24 NOTE — Consult Note (Signed)
Thomas Darby, MD [address]  [place]  [place], [place] [address]  Main: [phone]  Fax: [phone] Pager: [phone]   Consultation  Referring Provider:     No ref. provider found Primary Care Physician:  Ria Bush, MD Primary Gastroenterologist:  Dr. [name]         Reason for Consultation: Hematochezia  Date of Admission:  08/23/2018 Date of Consultation:  08/24/2018         HPI:   Thomas Carlson is a 72 y.o. male with history of stage III chronic kidney disease, coronary artery disease and other past medical history as detailed below who presents to the ER with worsening lower extremity weakness.  He had back surgery in June had some weakness since then.  He did have swelling of legs for which she has been started on diuretics.  He also reports rectal bleeding for the last 2 weeks.  He is seen by Dr. Allen Norris on 08/17/2018 secondary to watery diarrhea that was started earlier this month.  He has history of lymphocytic colitis taking budesonide.  He was started on Imodium.  He then started developing painless hematochezia for which he was seen in the office and plan was to undergo colonoscopy as outpatient today. His hemoglobin dropped from 12.7 in 05/2018 -8.6 on 08/17/2018. However, patient got admitted yesterday with severe hypokalemia, potassium 2.2, AKI on CKD.  Hemoglobin 8 today.  He continues to remain hypokalemic.  Currently being treated for AKI, hypokalemia.  Since admission, patient denies any episodes of rectal bleeding or diarrhea.  He reports that his leg weakness is slightly improved.  NSAIDs: None  Antiplts/Anticoagulants/Anti thrombotics: None  GI Procedures: Colonoscopy by Dr. Allen Norris 10/2015 - The examined portion of the ileum was normal. Biopsied. - Diverticulosis in the sigmoid colon. - Non-bleeding internal hemorrhoids. - Random biopsies were obtained in the entire colon. Diagnosis 1. Colon, biopsy, terminal ileum - BENIGN SMALL  BOWEL MUCOSA. NO VILLOUS ATROPHY, INFLAMMATION OR OTHER ABNORMALITIES PRESENT. 2. Colon, biopsy, random colon - BENIGN COLONIC MUCOSA WITH FEATURES CONSISTENT WITH MICROSCOPIC COLITIS, SEE COMMENT. Microscopic Comment 2. The sections show multiple fragments of colonic mucosa displaying mild increase in intraepithelial lymphocytes associated with orderly crypt architecture and lack of granulomata. The findings are most consistent with microscopic colitis (lymphocytic colitis) in the appropriate clinical and endoscopic setting. Dr. Saralyn Pilar has reviewed this biopsy and concurs. (BNS:kh 11-13-15)  Past Medical History:  Diagnosis Date  . Aortic valve sclerosis 08/13/2016   Sclerosis without stenosis by [place] (08/2016)  . CAD (coronary artery disease) 2005   s/p stent ([place])  . Cholelithiasis    by CT  . Chronic kidney disease, stage 3, mod decreased GFR (HCC)   . Diabetes mellitus ([place]) 2010  . Diverticulosis    by CT  . DJD (degenerative joint disease)    knee  . Genital herpes   . Gout   . Heart murmur    followed by PCP  . HLD (hyperlipidemia)   . HTN (hypertension)   . Knee pain, right    "needs replacement"  . Neuropathy    feet  . Obesity   . Seizures ([place])    x1 - after craniotomy (1960s)  . Weakness of both legs    "since back surgery"    Past Surgical History:  Procedure Laterality Date  . APPENDECTOMY  1958  . CLOSED REDUCTION CLAVICLE FRACTURE  1955  . COLONOSCOPY  02/2008   int hemorrhoids, diverticula (  Dr. Allen Norris)  . COLONOSCOPY WITH PROPOFOL N/A 11/10/2015   diverticulosis, path with microscopic colitis [name], MD)  . CORONARY ANGIOPLASTY WITH STENT PLACEMENT  2005   stent 2005  . DECOMPRESSIVE LUMBAR LAMINECTOMY LEVEL 1  03/2018   herniated L4/5 disc R with free fargment over L4 s/p surgery L4 ([place])  . HERNIA REPAIR  1956  . KNEE ARTHROSCOPY  2008   torn meniscus  . [place]   hit in helmet by baseball     Prior to Admission medications   Medication Sig Start Date End Date Taking? Authorizing Provider  acyclovir (ZOVIRAX) 400 MG tablet Take 1 tablet (400 mg total) by mouth 2 (two) times daily. 10/10/17  Yes Ria Bush, MD  allopurinol (ZYLOPRIM) 100 MG tablet Take 1 tablet (100 mg total) by mouth daily. 10/10/17  Yes Ria Bush, MD  aspirin EC 81 MG tablet Take 81 mg by mouth daily.   Yes [provider]  atenolol-chlorthalidone (TENORETIC) 50-25 MG tablet Take 1 tablet by mouth daily. 10/10/17  Yes Ria Bush, MD  b complex vitamins tablet Take 1 tablet by mouth daily.   Yes [provider]  Biotin 1000 MCG tablet Take 1,000 mcg by mouth daily.    Yes [provider]  budesonide (ENTOCORT EC) 3 MG 24 hr capsule Take 3 capsules at breakfast daily 07/10/18  Yes [name], MD  Cholecalciferol (VITAMIN D) 2000 units CAPS Take 1 capsule by mouth daily.   Yes [provider]  cyanocobalamin (,VITAMIN B-12,) 1000 MCG/ML injection Inject 1 mL (1,000 mcg total) into the muscle every 30 (thirty) days. 07/16/18  Yes Ria Bush, MD  furosemide (LASIX) 20 MG tablet Take 20-40 mg by mouth daily.  08/14/18  Yes [provider]  lovastatin (MEVACOR) 40 MG tablet Take 2 tablets (80 mg total) by mouth at bedtime. 10/10/17  Yes Ria Bush, MD  meloxicam (MOBIC) 7.5 MG tablet Take 7.5 mg by mouth 2 (two) times daily.  08/15/18  Yes [provider]  metFORMIN (GLUCOPHAGE) 500 MG tablet Take 1 tablet (500 mg total) by mouth 3 (three) times daily before meals. 07/16/18  Yes Ria Bush, MD  Multiple Vitamin (MULTIVITAMIN) tablet Take 1 tablet by mouth daily.   Yes [provider]  quinapril (ACCUPRIL) 20 MG tablet Take 1 tablet (20 mg total) by mouth at bedtime. 10/10/17  Yes Ria Bush, MD  vitamin C (ASCORBIC ACID) 500 MG tablet Take 500 mg by mouth daily.   Yes [provider]  Na Sulfate-K  Sulfate-Mg Sulf (SUPREP BOWEL PREP KIT) 17.5-3.13-1.6 GM/177ML SOLN Take 1 kit by mouth as directed. Patient not taking: Reported on 08/23/2018 08/17/18   [name], MD    Family History  Problem Relation Age of Onset  . Diabetes Father 84  . Coronary artery disease Sister        catheterizations  . COPD Brother   . Stroke Brother   . Hypertension Brother   . Cancer Neg Hx      Social History   Tobacco Use  . Smoking status: Never Smoker  . Smokeless tobacco: Never Used  Substance Use Topics  . Alcohol use: No    Alcohol/week: 0.0 standard drinks  . Drug use: No    Allergies as of 08/23/2018 - Review Complete 08/23/2018  Allergen Reaction Noted  . Gabapentin  08/19/2018  . Penicillins Hives 06/18/2012  . Bactrim [sulfamethoxazole-trimethoprim] Rash 05/19/2013    Review of Systems:  All systems reviewed and negative except where noted in HPI.   Physical Exam:  Vital signs in last 24 hours: Temp:  [98 F (36.7 C)-98.2 F (36.8 C)] 98.2 F (36.8 C) (10/28 0737) Pulse Rate:  [90-92] 91 (10/28 0737) Resp:  [18-19] 18 (10/28 0737) BP: (92-121)/(50-61) 121/50 (10/28 0737) SpO2:  [96 %-100 %] 96 % (10/28 0737) Last BM Date: 08/23/18 General:   Pleasant, cooperative in NAD Head:  Normocephalic and atraumatic. Eyes:   No icterus.   Conjunctiva pink. PERRLA. Ears:  Normal auditory acuity. Neck:  Supple; no masses or thyroidomegaly Lungs: Respirations even and unlabored. Lungs clear to auscultation bilaterally.   No wheezes, crackles, or rhonchi.  Heart:  Regular rate and rhythm;  Without murmur, clicks, rubs or gallops Abdomen:  Soft, obese, nondistended, nontender. Normal bowel sounds. No appreciable masses or hepatomegaly.  No rebound or guarding.  Rectal:  Not performed. Msk:  Symmetrical without gross deformities.  Strength normal Extremities:  Without edema, cyanosis or clubbing. Neurologic:  Alert and oriented x3;  grossly normal neurologically. Skin:   Intact without significant lesions or rashes. Cervical Nodes:  No significant cervical adenopathy. Psych:  Alert and cooperative. Normal affect.  LAB RESULTS: CBC Latest Ref Rng & Units 08/24/2018 08/23/2018 08/17/2018  WBC 4.0 - 10.5 K/uL 7.6 9.4 10.2  Hemoglobin 13.0 - 17.0 g/dL 8.0(L) 8.9(L) 8.6(L)  Hematocrit 39.0 - 52.0 % 24.1(L) 26.0(L) 25.6(L)  Platelets 150 - 400 K/uL 283 338 256    BMET BMP Latest Ref Rng & Units 08/24/2018 08/24/2018 08/23/2018  Glucose 70 - 99 mg/dL 197(H) 133(H) 167(H)  BUN 8 - 23 mg/dL 21 31(H) 37(H)  Creatinine 0.61 - 1.24 mg/dL 1.21 1.81(H) 2.41(H)  Sodium 135 - 145 mmol/L 137 140 140  Potassium 3.5 - 5.1 mmol/L 3.4(L) 2.1(LL) 2.2(LL)  Chloride 98 - 111 mmol/L 102 99 95(L)  CO2 22 - 32 mmol/L 24 28 27   Calcium 8.9 - 10.3 mg/dL 7.3(L) 7.1(L) 7.9(L)    LFT Hepatic Function Latest Ref Rng & Units 07/15/2018 09/30/2017 04/28/2017  Total Protein 6.0 - 8.3 g/dL 6.2 6.3 7.0  Albumin 3.5 - 5.2 g/dL 3.8 4.3 4.2  AST 0 - 37 U/L 21 18 21   ALT 0 - 53 U/L 40 14 21  Alk Phosphatase 39 - 117 U/L 61 52 55  Total Bilirubin 0.2 - 1.2 mg/dL 0.4 0.5 0.4     STUDIES: Dg Chest Port 1 View  Result Date: 08/23/2018 CLINICAL DATA:  Low blood pressure for the past 2-3 days. EXAM: PORTABLE CHEST 1 VIEW COMPARISON:  None. FINDINGS: Portable AP upright view. Mild cardiac enlargement with minimal aortic atherosclerosis. Low lung volumes without alveolar consolidation or edema. No acute osseous abnormality. IMPRESSION: Cardiomegaly with minimal aortic atherosclerosis. No active pulmonary disease. Electronically Signed   By: Ashley Royalty M.D.   On: 08/23/2018 14:09      Impression / Plan:   Thomas Carlson is a 72 y.o. male with AKI, hypokalemia secondary to diuretics and hematochezia  Hematochezia: Most likely diverticular bleed or secondary to hemorrhoids No evidence of active bleeding since admission Hemoglobin is downtrending, partially secondary to hemodilution as  patient is receiving maintenance IV fluids to treat AKI We will perform upper endoscopy and colonoscopy on Wednesday after correction of hypokalemia Monitor CBC daily, and transfuse if hemoglobin <7 He does not have iron deficiency  AKI and hypokalemia: Improving with IV fluids Potassium replacement  Thank you for involving me in the care of this patient.  Will follow along with you    LOS: 1 day   Sherri Sear, MD  08/24/2018, 7:23 PM   Note: This dictation was prepared with Dragon dictation along with smaller phrase technology. Any transcriptional errors that result from this process are unintentional.

## 2018-08-24 NOTE — Consult Note (Addendum)
PHARMACY CONSULT NOTE - FOLLOW UP  Pharmacy Consult for Electrolyte Monitoring and Replacement   Labs: Recent Labs    08/23/18 1338 08/23/18 1751 08/24/18 0324  WBC 9.4  --  7.6  HGB 8.9*  --  8.0*  HCT 26.0*  --  24.1*  PLT 338  --  283  CREATININE 2.41*  --  1.81*  MG  --  1.7  --   PHOS  --   --  2.2*   Potassium (mmol/L)  Date Value  08/24/2018 2.1 (LL)  05/19/2014 4.3   Magnesium (mg/dL)  Date Value  08/23/2018 1.7   Phosphorus (mg/dL)  Date Value  08/24/2018 2.2 (L)   Calcium (mg/dL)  Date Value  08/24/2018 7.1 (L)  07/21/2013 8.9   Albumin  Date Value  07/15/2018 3.8 g/dL  06/27/2011 4.2  ]  Estimated Creatinine Clearance: 42.2 mL/min (A) (by C-G formula based on SCr of 1.81 mg/dL (H)).   Assessment: Pharmacy consulted for electrolyte monitoring and replacement in 72 yo male admitted with acute renal failure related to dehydration and hypotension.  Potassium on admission was 2.2. Scr improving.   Goal of Therapy:  Electrolytes WNL   Plan:  10/28 Patient has Mg 2g IV x 1 dose, KCL 21mEq  3x a day x 4 doses, and KCL 40mEq IV x 6 ordered.   Will order Phos-Nak x 3 doses.  Will order K+ level for 1800 this evening.   Recheck all electrolytes with AM labs.   Pernell Dupre, PharmD, BCPS Clinical Pharmacist 08/24/2018 9:06 AM

## 2018-08-24 NOTE — Progress Notes (Signed)
[facility] [facility] [place] NAME: Flor Houdeshell    MR#:  [id number]  DATE OF BIRTH:  1946/03/09  SUBJECTIVE:  CHIEF COMPLAINT:   Chief Complaint  Patient presents with  . GI Bleeding  . Dizziness  tired, weak, not ambulating much since June, hoping to get his knee replacement REVIEW OF SYSTEMS:  Review of Systems  Constitutional: Positive for malaise/fatigue. Negative for chills, fever and weight loss.  HENT: Negative for nosebleeds and sore throat.   Eyes: Negative for blurred vision.  Respiratory: Negative for cough, shortness of breath and wheezing.   Cardiovascular: Negative for chest pain, orthopnea, leg swelling and PND.  Gastrointestinal: Positive for melena. Negative for abdominal pain, constipation, diarrhea, heartburn, nausea and vomiting.  Genitourinary: Negative for dysuria and urgency.  Musculoskeletal: Positive for back pain.  Skin: Negative for rash.  Neurological: Negative for dizziness, speech change, focal weakness and headaches.  Endo/Heme/Allergies: Does not bruise/bleed easily.  Psychiatric/Behavioral: Negative for depression.    DRUG ALLERGIES:   Allergies  Allergen Reactions  . Gabapentin   . Penicillins Hives    Has patient had a PCN reaction causing immediate rash, facial/tongue/throat swelling, SOB or lightheadedness with hypotension: Unknown Has patient had a PCN reaction causing severe rash involving mucus membranes or skin necrosis: Unknown Has patient had a PCN reaction that required hospitalization: Unknown Has patient had a PCN reaction occurring within the last 10 years: Unknown If all of the above answers are "NO", then may proceed with Cephalosporin use.   . Bactrim [Sulfamethoxazole-Trimethoprim] Rash    Diffuse drug reaction - maculopapular rash   VITALS:  Blood pressure 118/62, pulse 76, temperature 98 F (36.7 C), temperature source Oral, resp. rate 19, height 5\' 8"  (1.727 m), weight 102.4 kg,  SpO2 97 %. PHYSICAL EXAMINATION:  Physical Exam  Constitutional: He is oriented to person, place, and time.  HENT:  Head: Normocephalic and atraumatic.  Eyes: Pupils are equal, round, and reactive to light. Conjunctivae and EOM are normal.  Neck: Normal range of motion. Neck supple. No tracheal deviation present. No thyromegaly present.  Cardiovascular: Normal rate, regular rhythm and normal heart sounds.  Pulmonary/Chest: Effort normal and breath sounds normal. No respiratory distress. He has no wheezes. He exhibits no tenderness.  Abdominal: Soft. Bowel sounds are normal. He exhibits no distension. There is no tenderness.  Musculoskeletal: Normal range of motion.  Neurological: He is alert and oriented to person, place, and time. No cranial nerve deficit.  Skin: Skin is warm and dry. No rash noted.   LABORATORY PANEL:  Male CBC Recent Labs  Lab 08/24/18 0324  WBC 7.6  HGB 8.0*  HCT 24.1*  PLT 283   ------------------------------------------------------------------------------------------------------------------ Chemistries  Recent Labs  Lab 08/23/18 1751  08/24/18 1712  NA  --    < > 137  K  --    < > 3.4*  CL  --    < > 102  CO2  --    < > 24  GLUCOSE  --    < > 197*  BUN  --    < > 21  CREATININE  --    < > 1.21  CALCIUM  --    < > 7.3*  MG 1.7  --   --    < > = values in this interval not displayed.   RADIOLOGY:  No results found. ASSESSMENT AND PLAN:  72 y.o. male with a known history of hypertension, hyperlipidemia, CKD  3, type 2 diabetes, CAD status post stent, seizures, obesity admitted for severe hypokalemia, lower extremity weakness and inability to walk   * Acute on CKD 3- likely related to hypotension and dehydration in the setting of recent initiation of Lasix over the last week.  ACE inhibitor and Meloxicam also likely contributing. -aggressive IV hydration -Hold lasix, quinapril, chlorthalidone, and meloxicam -Recheck creatinine in the morning  *  GI bleeding- has had bright red blood per rectum over the last couple of weeks.  Follows with Dr. Allen Norris as an outpatient.   - Hgb 8.0 (baseline 12-14) - GI planning for endoscopy while inpt -Hold aspirin and anticoagulation for GI bleeding  * Hypokalemia- K 2.2.  Likely related to Lasix use. -Replete - recheck at 3.4 -Check magnesium  * Lactic acidosis- likely related to dehydration, also takes metformin as an outpatient. No signs of sepsis.  -Fluids -Trend lactic acid - improving  * Hypotension- BPs improved s/p fluid bolus. Likely due to combination of hypokalemia, hypotension, and drop in Hgb from 12 to 8.9. -Holding lasix, quinapril, chlorthalidone -Decrease home atenolol dose to 25mg  qd  Lower extremity edema- had recent ECHO 08/12/18 that showed normal EF and G1DD -Ted hose and elevate legs -Hold lasix  Type 2 diabetes-  -Sensitive SSI     All the records are reviewed and case discussed with Care Management/Social Worker. Management plans discussed with the patient, family (wife at bedside) and they are in agreement.  CODE STATUS: Full Code  TOTAL TIME TAKING CARE OF THIS PATIENT: 35 minutes.   More than 50% of the time was spent in counseling/coordination of care: YES  POSSIBLE D/C IN 2-3 DAYS, DEPENDING ON CLINICAL CONDITION and GI eval   Lauren Aguayo M.D on 08/24/2018 at 9:37 PM  Between 7am to 6pm - Pager - [phone]  After 6pm go to [email or web address] - Proofreader  [facility]  Office  [phone]  CC: Primary care physician; Ria Bush, MD  Note: This dictation was prepared with Dragon dictation along with smaller phrase technology. Any transcriptional errors that result from this process are unintentional.

## 2018-08-24 NOTE — Progress Notes (Signed)
PT Cancellation Note  Patient Details Name: AYDIAN DIMMICK MRN: [id number] DOB: [birth year]   Cancelled Treatment:    Reason Eval/Treat Not Completed: Medical issues which prohibited therapy: Pt's K+ taken this AM at 2.1 trending down and falling outside guidelines for participation with PT services.  Will attempt to see pt at a future date/time as medically appropriate.     Linus Salmons PT, DPT 08/24/18, 8:46 AM

## 2018-08-24 NOTE — Consult Note (Signed)
PHARMACY CONSULT NOTE - FOLLOW UP  Pharmacy Consult for Electrolyte Monitoring and Replacement   Labs: Recent Labs    08/23/18 1338 08/23/18 1751 08/24/18 0324 08/24/18 1712  WBC 9.4  --  7.6  --   HGB 8.9*  --  8.0*  --   HCT 26.0*  --  24.1*  --   PLT 338  --  283  --   CREATININE 2.41*  --  1.81* 1.21  MG  --  1.7  --   --   PHOS  --   --  2.2*  --    Potassium (mmol/L)  Date Value  08/24/2018 3.4 (L)  05/19/2014 4.3   Magnesium (mg/dL)  Date Value  08/23/2018 1.7   Phosphorus (mg/dL)  Date Value  08/24/2018 2.2 (L)   Calcium (mg/dL)  Date Value  08/24/2018 7.3 (L)  07/21/2013 8.9   Albumin  Date Value  07/15/2018 3.8 g/dL  06/27/2011 4.2  ]  Estimated Creatinine Clearance: 63.1 mL/min (by C-G formula based on SCr of 1.21 mg/dL).   Assessment: Pharmacy consulted for electrolyte monitoring and replacement in 72 yo male admitted with acute renal failure related to dehydration and hypotension.  Potassium on admission was 2.2. Scr improving.   Goal of Therapy:  Electrolytes WNL   Plan:  10/28 Patient has Mg 2g IV x 1 dose, KCL 35mEq  3x a day x 4 doses, and KCL 76mEq IV x 6 ordered.   Will order Phos-Nak x 3 doses.  Will order K+ level for 1800 this evening.   10/28 17:12 K 3.4. Patient still has oral dose of Klor Con scheduled for this evening at 20:00. Will give that and recheck electrolytes with AM labs.   Recheck all electrolytes with AM labs.   Laural Benes, PharmD, BCPS Clinical Pharmacist 08/24/2018 6:29 PM

## 2018-08-25 LAB — PHOSPHORUS: PHOSPHORUS: 1.3 mg/dL — AB (ref 2.5–4.6)

## 2018-08-25 LAB — HEPATIC FUNCTION PANEL
ALT: 39 U/L (ref 0–44)
AST: 27 U/L (ref 15–41)
Albumin: 2.6 g/dL — ABNORMAL LOW (ref 3.5–5.0)
Alkaline Phosphatase: 47 U/L (ref 38–126)
Total Bilirubin: 0.7 mg/dL (ref 0.3–1.2)
Total Protein: 4.9 g/dL — ABNORMAL LOW (ref 6.5–8.1)

## 2018-08-25 LAB — CBC
HCT: 23.2 % — ABNORMAL LOW (ref 39.0–52.0)
Hemoglobin: 7.7 g/dL — ABNORMAL LOW (ref 13.0–17.0)
MCH: 34.1 pg — AB (ref 26.0–34.0)
MCHC: 33.2 g/dL (ref 30.0–36.0)
MCV: 102.7 fL — ABNORMAL HIGH (ref 80.0–100.0)
PLATELETS: 270 10*3/uL (ref 150–400)
RBC: 2.26 MIL/uL — AB (ref 4.22–5.81)
RDW: 16.9 % — ABNORMAL HIGH (ref 11.5–15.5)
WBC: 7.5 10*3/uL (ref 4.0–10.5)
nRBC: 0 % (ref 0.0–0.2)

## 2018-08-25 LAB — GLUCOSE, CAPILLARY
GLUCOSE-CAPILLARY: 146 mg/dL — AB (ref 70–99)
GLUCOSE-CAPILLARY: 150 mg/dL — AB (ref 70–99)
Glucose-Capillary: 261 mg/dL — ABNORMAL HIGH (ref 70–99)
Glucose-Capillary: 126 mg/dL — ABNORMAL HIGH (ref 70–99)

## 2018-08-25 LAB — BASIC METABOLIC PANEL
ANION GAP: 7 (ref 5–15)
BUN: 19 mg/dL (ref 8–23)
CALCIUM: 7.1 mg/dL — AB (ref 8.9–10.3)
CO2: 30 mmol/L (ref 22–32)
Chloride: 104 mmol/L (ref 98–111)
Creatinine, Ser: 1.13 mg/dL (ref 0.61–1.24)
GFR calc Af Amer: >60 mL/min (ref >60)
GLUCOSE: 155 mg/dL — AB (ref 70–99)
Potassium: 4.1 mmol/L (ref 3.5–5.1)
Sodium: 141 mmol/L (ref 135–145)

## 2018-08-25 LAB — LACTATE DEHYDROGENASE: LDH: 208 U/L — AB (ref 98–192)

## 2018-08-25 LAB — PATHOLOGIST SMEAR REVIEW

## 2018-08-25 LAB — MAGNESIUM: MAGNESIUM: 1.8 mg/dL (ref 1.7–2.4)

## 2018-08-25 MED ORDER — POTASSIUM & SODIUM PHOSPHATES 280-160-250 MG PO PACK
2.0000 | PACK | ORAL | Status: AC
  Administered 2018-08-25 – 2018-08-26 (×3): 2 via ORAL
  Filled 2018-08-25 (×4): qty 2

## 2018-08-25 NOTE — Progress Notes (Signed)
    Thomas Darby, MD [address]  [facility]  [place], [place] [address]  Main: [phone]  Fax: [phone] Pager: [phone]   Subjective: Doing well today.  He had a bowel movement but denies rectal bleeding.  Hemoglobin dropped today.   Objective: Vital signs in last 24 hours: Vitals:   08/24/18 1949 08/24/18 2348 08/25/18 0749 08/25/18 1721  BP: 118/62 109/66 (!) 98/46 112/64  Pulse: 76 68 63 86  Resp: 19 19 18 18   Temp: 98 F (36.7 C) 98 F (36.7 C) 97.9 F (36.6 C) 98.3 F (36.8 C)  TempSrc: Oral  Oral Oral  SpO2: 97%  97% 98%  Weight:      Height:       Weight change:   Intake/Output Summary (Last 24 hours) at 08/25/2018 1942 Last data filed at 08/25/2018 0900 Gross per 24 hour  Intake 692.62 ml  Output 550 ml  Net 142.62 ml     Exam: Heart:: Regular rate and rhythm or S1S2 present Lungs: normal and clear to auscultation Abdomen: soft, nontender, normal bowel sounds   Lab Results: CBC Latest Ref Rng & Units 08/25/2018 08/24/2018 08/23/2018  WBC 4.0 - 10.5 K/uL 7.5 7.6 9.4  Hemoglobin 13.0 - 17.0 g/dL 7.7(L) 8.0(L) 8.9(L)  Hematocrit 39.0 - 52.0 % 23.2(L) 24.1(L) 26.0(L)  Platelets 150 - 400 K/uL 270 283 338   CMP Latest Ref Rng & Units 08/25/2018 08/24/2018 08/24/2018  Glucose 70 - 99 mg/dL 155(H) 197(H) 133(H)  BUN 8 - 23 mg/dL 19 21 31(H)  Creatinine 0.61 - 1.24 mg/dL 1.13 1.21 1.81(H)  Sodium 135 - 145 mmol/L 141 137 140  Potassium 3.5 - 5.1 mmol/L 4.1 3.4(L) 2.1(LL)  Chloride 98 - 111 mmol/L 104 102 99  CO2 22 - 32 mmol/L 30 24 28   Calcium 8.9 - 10.3 mg/dL 7.1(L) 7.3(L) 7.1(L)  Total Protein 6.5 - 8.1 g/dL 4.9(L) - -  Total Bilirubin 0.3 - 1.2 mg/dL 0.7 - -  Alkaline Phos 38 - 126 U/L 47 - -  AST 15 - 41 U/L 27 - -  ALT 0 - 44 U/L 39 - -   Micro Results: No results found for this or any previous visit (from the past 240 hour(s)). Studies/Results: No results found. Medications: I have reviewed the patient's current  medications. Scheduled Meds: . acyclovir  400 mg Oral BID  . allopurinol  100 mg Oral Daily  . budesonide  9 mg Oral Daily  . insulin aspart  0-5 Units Subcutaneous QHS  . insulin aspart  0-9 Units Subcutaneous TID WC  . polyethylene glycol-electrolytes  4,000 mL Oral Once  . potassium & sodium phosphates  2 packet Oral Q2H   Continuous Infusions: PRN Meds:.acetaminophen **OR** acetaminophen, ondansetron **OR** ondansetron (ZOFRAN) IV   Assessment: Active Problems:   Acute renal failure (ARF) (HCC) Rectal bleeding, hypokalemia Hypokalemia resolved, AKI resolved  Plan: Plan for EGD and colonoscopy tomorrow Macrocytic anemia, peripheral blood smear reviewed by pathologist.  Macrocytosis only B12 levels are normal.  Check RBC folic acid, homocystine levels Consider hematology consult   LOS: 2 days   Lashia Niese 08/25/2018, 7:42 PM

## 2018-08-25 NOTE — Evaluation (Signed)
Physical Therapy Evaluation Patient Details Name: Thomas Carlson MRN: [id number] DOB: November 15, 1945 Today's Date: 08/25/2018   History of Present Illness  Thomas Carlson is a 72 y.o. male with weakness and dizziness over the last several days, GI bleeding, and blood in his stool.  The patient reports that he has had blood in the stool for about the last month occurring during almost every bowel movement and the color of red wine.  He denies fever or chills, chest pain, shortness of breath, or abdominal pain. Pt has had one fall down 2 steps in the last month due to weakness.   Clinical Impression  Upon arrival pt sitting edge of bed, pleasant and eager to work with PT. Pt has a history of back surgery 6/19 followed by BLE weakness, long term R knee pain with plans to get R TKR until he was admitted to the hospital for GI bleeding. Pt also has tingling in B feet that was not apparent before his back surgery. Pt currently limited in strength, balance, and mobility secondary to BLE weakness and R knee pain. Pt ambulating 220 feet with SPC for half and no AD for half. Pt has R sided limp with decreased WBing tolerance on RLE. Pt needs no physical assist for ambulation but demonstrate one LOB during initial ambulation able to correct balance independently. Pt would benefit from skilled PT in outpatient PT for management of weakness in BLE and in preparation for TKR.     Follow Up Recommendations Outpatient PT    Equipment Recommendations       Recommendations for Other Services       Precautions / Restrictions Precautions Precautions: Fall Restrictions Weight Bearing Restrictions: No      Mobility  Bed Mobility Overal bed mobility: Independent                Transfers Overall transfer level: Modified independent   Transfers: Sit to/from Stand Sit to Stand: Modified independent (Device/Increase time)         General transfer comment: Quick to rise no physical assist,  asymptomatic upon standing  Ambulation/Gait Ambulation/Gait assistance: Min guard Gait Distance (Feet): 220 Feet Assistive device: None;Straight cane       General Gait Details: 110 with no AD, 110 with cane. Upon initial standing pt stumbling but catching himself with no physical assist from PT, pt having moderate limp on R, decreased step length on L, improved gait mechanics with SPC, pt reporting "heaviness" in BLE during ambulation  Stairs            Wheelchair Mobility    Modified Rankin (Stroke Patients Only)       Balance Overall balance assessment: Mild deficits observed, not formally tested                                           Pertinent Vitals/Pain Pain Assessment: Faces Faces Pain Scale: Hurts a little bit Pain Location: R knee during MMT Pain Intervention(s): Monitored during session    Home Living Family/patient expects to be discharged to:: Private residence Living Arrangements: Spouse/significant other Available Help at Discharge: Family Type of Home: House Home Access: Stairs to enter Entrance Stairs-Rails: Can reach both Entrance Stairs-Number of Steps: 3 Home Layout: Two level Home Equipment: Cane - single point      Prior Function Level of Independence: Independent  Comments: Limited to household ambulation due to weakness and knee pain but no use of AD     Hand Dominance        Extremity/Trunk Assessment   Upper Extremity Assessment Upper Extremity Assessment: Overall WFL for tasks assessed    Lower Extremity Assessment Lower Extremity Assessment: Generalized weakness(hip flexion R 4-/5 L 3+/5 , knee ext R 4-/5 L 4/5, knee flex 5/5, DF B 4-/5, PF B 3-/5), B foot tingling no other related neural symptoms in BLE       Communication   Communication: No difficulties  Cognition Arousal/Alertness: Awake/alert Behavior During Therapy: WFL for tasks assessed/performed Overall Cognitive Status: Within  Functional Limits for tasks assessed                                        General Comments      Exercises     Assessment/Plan    PT Assessment Patient needs continued PT services  PT Problem List Decreased strength;Decreased range of motion;Decreased activity tolerance;Decreased safety awareness;Decreased balance;Decreased mobility       PT Treatment Interventions DME instruction;Gait training;Balance training;Neuromuscular re-education;Modalities;Stair training;Functional mobility training;Patient/family education;Therapeutic activities;Therapeutic exercise    PT Goals (Current goals can be found in the Care Plan section)  Acute Rehab PT Goals Patient Stated Goal: improve strength and QOL PT Goal Formulation: With patient Time For Goal Achievement: 09/08/18 Potential to Achieve Goals: Good    Frequency Min 2X/week   Barriers to discharge        Co-evaluation               AM-PAC PT "6 Clicks" Daily Activity  Outcome Measure Difficulty turning over in bed (including adjusting bedclothes, sheets and blankets)?: None Difficulty moving from lying on back to sitting on the side of the bed? : None Difficulty sitting down on and standing up from a chair with arms (e.g., wheelchair, bedside commode, etc,.)?: None Help needed moving to and from a bed to chair (including a wheelchair)?: A Little Help needed walking in hospital room?: A Little Help needed climbing 3-5 steps with a railing? : A Little 6 Click Score: 21    End of Session Equipment Utilized During Treatment: Gait belt Activity Tolerance: Patient tolerated treatment well Patient left: in bed;with call bell/phone within reach;with bed alarm set Nurse Communication: Mobility status PT Visit Diagnosis: Unsteadiness on feet (R26.81);Difficulty in walking, not elsewhere classified (R26.2);Pain;Muscle weakness (generalized) (M62.81) Pain - Right/Left: Right Pain - part of body: Knee    Time:  1455-1530 PT Time Calculation (min) (ACUTE ONLY): 35 min   Charges:              Ernie Avena, SPT 08/25/2018, 4:19 PM

## 2018-08-25 NOTE — Progress Notes (Signed)
[facility] [place] NAME: Thomas Carlson    MR#:  [id number]  DATE OF BIRTH:  Aug 12, 1946  SUBJECTIVE:  CHIEF COMPLAINT:   Chief Complaint  Patient presents with  . GI Bleeding  . Dizziness  No complaints, remains hypotensive. hoping to get his endoscopy done while here.  Hemoglobin dropped to 7.7, no bleeding REVIEW OF SYSTEMS:  Review of Systems  Constitutional: Positive for malaise/fatigue. Negative for chills, fever and weight loss.  HENT: Negative for nosebleeds and sore throat.   Eyes: Negative for blurred vision.  Respiratory: Negative for cough, shortness of breath and wheezing.   Cardiovascular: Negative for chest pain, orthopnea, leg swelling and PND.  Gastrointestinal: Positive for melena. Negative for abdominal pain, constipation, diarrhea, heartburn, nausea and vomiting.  Genitourinary: Negative for dysuria and urgency.  Musculoskeletal: Positive for back pain.  Skin: Negative for rash.  Neurological: Negative for dizziness, speech change, focal weakness and headaches.  Endo/Heme/Allergies: Does not bruise/bleed easily.  Psychiatric/Behavioral: Negative for depression.   DRUG ALLERGIES:   Allergies  Allergen Reactions  . Gabapentin   . Penicillins Hives    Has patient had a PCN reaction causing immediate rash, facial/tongue/throat swelling, SOB or lightheadedness with hypotension: Unknown Has patient had a PCN reaction causing severe rash involving mucus membranes or skin necrosis: Unknown Has patient had a PCN reaction that required hospitalization: Unknown Has patient had a PCN reaction occurring within the last 10 years: Unknown If all of the above answers are "NO", then may proceed with Cephalosporin use.   . Bactrim [Sulfamethoxazole-Trimethoprim] Rash    Diffuse drug reaction - maculopapular rash   VITALS:  Blood pressure (!) 98/46, pulse 63, temperature 97.9 F (36.6 C), temperature source Oral, resp. rate  18, height 5\' 8"  (1.727 m), weight 102.4 kg, SpO2 97 %. PHYSICAL EXAMINATION:  Physical Exam  Constitutional: He is oriented to person, place, and time.  HENT:  Head: Normocephalic and atraumatic.  Eyes: Pupils are equal, round, and reactive to light. Conjunctivae and EOM are normal.  Neck: Normal range of motion. Neck supple. No tracheal deviation present. No thyromegaly present.  Cardiovascular: Normal rate, regular rhythm and normal heart sounds.  Pulmonary/Chest: Effort normal and breath sounds normal. No respiratory distress. He has no wheezes. He exhibits no tenderness.  Abdominal: Soft. Bowel sounds are normal. He exhibits no distension. There is no tenderness.  Musculoskeletal: Normal range of motion.  Neurological: He is alert and oriented to person, place, and time. No cranial nerve deficit.  Skin: Skin is warm and dry. No rash noted.   LABORATORY PANEL:  Male CBC Recent Labs  Lab 08/25/18 0549  WBC 7.5  HGB 7.7*  HCT 23.2*  PLT 270   ------------------------------------------------------------------------------------------------------------------ Chemistries  Recent Labs  Lab 08/25/18 0549 08/25/18 1019  NA 141  --   K 4.1  --   CL 104  --   CO2 30  --   GLUCOSE 155*  --   BUN 19  --   CREATININE 1.13  --   CALCIUM 7.1*  --   MG 1.8  --   AST  --  27  ALT  --  39  ALKPHOS  --  47  BILITOT  --  0.7   RADIOLOGY:  No results found. ASSESSMENT AND PLAN:  72 y.o. male with a known history of hypertension, hyperlipidemia, CKD 3, type 2 diabetes, CAD status post stent, seizures, obesity admitted for severe hypokalemia, lower extremity weakness  and inability to walk   * Acute on CKD 3- likely related to hypotension and dehydration in the setting of recent initiation of Lasix over the last week.  ACE inhibitor and Meloxicam also likely contributing.  Cr down to 1.3 -Hold lasix, quinapril, chlorthalidone, and meloxicam  * GI bleeding- has had bright red blood  per rectum over the last couple of weeks.  Follows with Dr. Allen Norris as an outpatient.   - Hgb 7.7 (8.0) (baseline 12-14) - GI planning for endoscopy tomorrow -Hold aspirin and anticoagulation for GI bleeding  * Hypokalemia- K 4.1.  Likely related to Lasix use.  * Lactic acidosis- likely related to dehydration, also takes metformin as an outpatient. No signs of sepsis.  -Fluids -Trend lactic acid - improving  * Hypotension- BPs improved s/p fluid bolus. Likely due to combination of hypokalemia, hypotension, and drop in Hgb from 12 to 8.9. -Holding lasix, quinapril, chlorthalidone -Decrease home atenolol dose to 25mg  qd  Lower extremity edema- had recent ECHO 08/12/18 that showed normal EF and G1DD -Ted hose and elevate legs -Hold lasix  Type 2 diabetes-  -Sensitive SSI     All the records are reviewed and case discussed with Care Management/Social Worker. Management plans discussed with the patient, nursing and they are in agreement.  CODE STATUS: Full Code  TOTAL TIME TAKING CARE OF THIS PATIENT: 35 minutes.   More than 50% of the time was spent in counseling/coordination of care: YES  POSSIBLE D/C IN 1-2 DAYS, DEPENDING ON CLINICAL CONDITION and GI eval   Terisha Losasso M.D on 08/25/2018 at 3:21 PM  Between 7am to 6pm - Pager - [phone]  After 6pm go to [email or web address] - Proofreader  [facility]  Office  [phone]  CC: Primary care physician; Ria Bush, MD  Note: This dictation was prepared with Dragon dictation along with smaller phrase technology. Any transcriptional errors that result from this process are unintentional.

## 2018-08-25 NOTE — Consult Note (Signed)
PHARMACY CONSULT NOTE - FOLLOW UP  Pharmacy Consult for Electrolyte Monitoring and Replacement   Labs: Recent Labs    08/23/18 1338 08/23/18 1751 08/24/18 0324 08/24/18 1712 08/25/18 0549 08/25/18 1019  WBC 9.4  --  7.6  --  7.5  --   HGB 8.9*  --  8.0*  --  7.7*  --   HCT 26.0*  --  24.1*  --  23.2*  --   PLT 338  --  283  --  270  --   CREATININE 2.41*  --  1.81* 1.21 1.13  --   MG  --  1.7  --   --  1.8  --   PHOS  --   --  2.2*  --  1.3*  --   ALBUMIN  --   --   --   --   --  2.6*  PROT  --   --   --   --   --  4.9*  AST  --   --   --   --   --  27  ALT  --   --   --   --   --  39  ALKPHOS  --   --   --   --   --  47  BILITOT  --   --   --   --   --  0.7  BILIDIR  --   --   --   --   --  <0.1  IBILI  --   --   --   --   --  NOT CALCULATED   Potassium (mmol/L)  Date Value  08/25/2018 4.1  05/19/2014 4.3   Magnesium (mg/dL)  Date Value  08/25/2018 1.8   Phosphorus (mg/dL)  Date Value  08/25/2018 1.3 (L)   Calcium (mg/dL)  Date Value  08/25/2018 7.1 (L)  07/21/2013 8.9   Albumin  Date Value  08/25/2018 2.6 g/dL (L)  06/27/2011 4.2  ]  Estimated Creatinine Clearance: 67.5 mL/min (by C-G formula based on SCr of 1.13 mg/dL).   Assessment: Pharmacy consulted for electrolyte monitoring and replacement in 72 yo male admitted with acute renal failure related to dehydration and hypotension. Potassium on admission was 2.2. Scr improving.   10/28 Patient was administered there following: Mg 2g IV x 1 dose, KCL 28mEq  3x a day x 4 doses, and KCL 24mEq IV x 6 ordered.   10/29: All monitored electrolytes from original consult are wnl except phosphorous. Despite being at a very low level IV formulations are not reasonable due to the amount of potassium or sodium since levels of these electrolytes are wnl.  Goal of Therapy:  Electrolytes WNL   Plan:  Phosphorous 500mg  q2h x4   Recheck all electrolytes with AM labs.   Dallie Piles, PharmD Clinical  Pharmacist 08/25/2018 3:36 PM

## 2018-08-26 ENCOUNTER — Encounter: Payer: Self-pay | Admitting: Anesthesiology

## 2018-08-26 ENCOUNTER — Encounter
Admission: EM | Disposition: A | Discharge status: Home / Self Care | Payer: Self-pay | Source: Home / Self Care | Attending: Internal Medicine

## 2018-08-26 ENCOUNTER — Inpatient Hospital Stay: Payer: Medicare HMO | Admitting: Anesthesiology

## 2018-08-26 ENCOUNTER — Ambulatory Visit: Payer: Medicare HMO

## 2018-08-26 DIAGNOSIS — D62 Acute posthemorrhagic anemia: Secondary | ICD-10-CM

## 2018-08-26 DIAGNOSIS — D123 Benign neoplasm of transverse colon: Secondary | ICD-10-CM

## 2018-08-26 DIAGNOSIS — K625 Hemorrhage of anus and rectum: Secondary | ICD-10-CM

## 2018-08-26 DIAGNOSIS — K3189 Other diseases of stomach and duodenum: Secondary | ICD-10-CM

## 2018-08-26 DIAGNOSIS — K2980 Duodenitis without bleeding: Secondary | ICD-10-CM

## 2018-08-26 DIAGNOSIS — K5730 Diverticulosis of large intestine without perforation or abscess without bleeding: Secondary | ICD-10-CM

## 2018-08-26 HISTORY — PX: COLONOSCOPY: SHX5424

## 2018-08-26 HISTORY — PX: ESOPHAGOGASTRODUODENOSCOPY: SHX5428

## 2018-08-26 LAB — CBC WITH DIFFERENTIAL/PLATELET
ABS IMMATURE GRANULOCYTES: 0.08 10*3/uL — AB (ref 0.00–0.07)
BASOS PCT: 0 %
Basophils Absolute: 0 10*3/uL (ref 0.0–0.1)
Eosinophils Absolute: 0 10*3/uL (ref 0.0–0.5)
Eosinophils Relative: 0 %
HCT: 23.2 % — ABNORMAL LOW (ref 39.0–52.0)
Hemoglobin: 8 g/dL — ABNORMAL LOW (ref 13.0–17.0)
IMMATURE GRANULOCYTES: 1 %
Lymphocytes Relative: 14 %
Lymphs Abs: 1.1 10*3/uL (ref 0.7–4.0)
MCH: 35.2 pg — ABNORMAL HIGH (ref 26.0–34.0)
MCHC: 34.5 g/dL (ref 30.0–36.0)
MCV: 102.2 fL — ABNORMAL HIGH (ref 80.0–100.0)
Monocytes Absolute: 0.5 10*3/uL (ref 0.1–1.0)
Monocytes Relative: 7 %
NEUTROS ABS: 6.2 10*3/uL (ref 1.7–7.7)
NEUTROS PCT: 78 %
PLATELETS: 275 10*3/uL (ref 150–400)
RBC: 2.27 MIL/uL — AB (ref 4.22–5.81)
RDW: 16.6 % — ABNORMAL HIGH (ref 11.5–15.5)
WBC: 7.9 10*3/uL (ref 4.0–10.5)
nRBC: 0.3 % — ABNORMAL HIGH (ref 0.0–0.2)

## 2018-08-26 LAB — PHOSPHORUS: Phosphorus: 2 mg/dL — ABNORMAL LOW (ref 2.5–4.6)

## 2018-08-26 LAB — BASIC METABOLIC PANEL
ANION GAP: 9 (ref 5–15)
BUN: 16 mg/dL (ref 8–23)
CHLORIDE: 101 mmol/L (ref 98–111)
CO2: 27 mmol/L (ref 22–32)
Calcium: 7 mg/dL — ABNORMAL LOW (ref 8.9–10.3)
Creatinine, Ser: 1.16 mg/dL (ref 0.61–1.24)
Glucose, Bld: 152 mg/dL — ABNORMAL HIGH (ref 70–99)
POTASSIUM: 3.6 mmol/L (ref 3.5–5.1)
SODIUM: 137 mmol/L (ref 135–145)

## 2018-08-26 LAB — MAGNESIUM: MAGNESIUM: 1.6 mg/dL — AB (ref 1.7–2.4)

## 2018-08-26 LAB — GLUCOSE, CAPILLARY: GLUCOSE-CAPILLARY: 123 mg/dL — AB (ref 70–99)

## 2018-08-26 SURGERY — EGD (ESOPHAGOGASTRODUODENOSCOPY)
Anesthesia: General

## 2018-08-26 MED ORDER — PHENYLEPHRINE HCL 10 MG/ML IJ SOLN
INTRAMUSCULAR | Status: DC | PRN
  Administered 2018-08-26: 100 ug via INTRAVENOUS

## 2018-08-26 MED ORDER — MAGNESIUM SULFATE 2 GM/50ML IV SOLN
2.0000 g | Freq: Once | INTRAVENOUS | Status: DC
  Filled 2018-08-26: qty 50

## 2018-08-26 MED ORDER — POTASSIUM PHOSPHATES 15 MMOLE/5ML IV SOLN
30.0000 mmol | Freq: Once | INTRAVENOUS | Status: DC
  Filled 2018-08-26: qty 10

## 2018-08-26 MED ORDER — LIDOCAINE HCL (CARDIAC) PF 100 MG/5ML IV SOSY
PREFILLED_SYRINGE | INTRAVENOUS | Status: DC | PRN
  Administered 2018-08-26: 50 mg via INTRAVENOUS

## 2018-08-26 MED ORDER — PROPOFOL 500 MG/50ML IV EMUL
INTRAVENOUS | Status: DC | PRN
  Administered 2018-08-26: 140 ug/kg/min via INTRAVENOUS

## 2018-08-26 MED ORDER — SODIUM CHLORIDE 0.9 % IV SOLN
INTRAVENOUS | Status: DC
  Administered 2018-08-26: 11:00:00 via INTRAVENOUS

## 2018-08-26 MED ORDER — PROPOFOL 500 MG/50ML IV EMUL
INTRAVENOUS | Status: AC
  Filled 2018-08-26: qty 50

## 2018-08-26 MED ORDER — PROPOFOL 10 MG/ML IV BOLUS
INTRAVENOUS | Status: DC | PRN
  Administered 2018-08-26: 20 mg via INTRAVENOUS
  Administered 2018-08-26: 90 mg via INTRAVENOUS

## 2018-08-26 MED ORDER — PANTOPRAZOLE SODIUM 40 MG PO TBEC: 40.0000 mg | DELAYED_RELEASE_TABLET | Freq: Every day | ORAL | 0 refills | Status: DC

## 2018-08-26 MED ORDER — LIDOCAINE HCL (PF) 2 % IJ SOLN
INTRAMUSCULAR | Status: AC
  Filled 2018-08-26: qty 10

## 2018-08-26 NOTE — Transfer of Care (Signed)
Immediate Anesthesia Transfer of Care Note  Patient: Thomas Carlson  Procedure(s) Performed: ESOPHAGOGASTRODUODENOSCOPY (EGD) (N/A ) COLONOSCOPY (N/A )  Patient Location: PACU and Endoscopy Unit  Anesthesia Type:General  Level of Consciousness: drowsy  Airway & Oxygen Therapy: Patient Spontanous Breathing  Post-op Assessment: Report given to RN and Post -op Vital signs reviewed and stable  Post vital signs: Reviewed and stable  Last Vitals:  Vitals Value Taken Time  BP 109/62 08/26/2018 11:54 AM  Temp 36.1 C 08/26/2018 11:54 AM  Pulse 72 08/26/2018 11:54 AM  Resp 14 08/26/2018 11:54 AM  SpO2 97 % 08/26/2018 11:54 AM  Vitals shown include unvalidated device data.  Last Pain:  Vitals:   08/26/18 1154  TempSrc: Tympanic  PainSc:       Patients Stated Pain Goal: 3 (32/76/14 7092)  Complications: No apparent anesthesia complications

## 2018-08-26 NOTE — Discharge Instructions (Signed)
Anemia Anemia is a condition in which you do not have enough red blood cells or hemoglobin. Hemoglobin is a substance in red blood cells that carries oxygen. When you do not have enough red blood cells or hemoglobin (are anemic), your body cannot get enough oxygen and your organs may not work properly. As a result, you may feel very tired or have other problems. What are the causes? Common causes of anemia include:  Excessive bleeding. Anemia can be caused by excessive bleeding inside or outside the body, including bleeding from the intestine or from periods in women.  Poor nutrition.  Long-lasting (chronic) kidney, thyroid, and liver disease.  Bone marrow disorders.  Cancer and treatments for cancer.  HIV (human immunodeficiency virus) and AIDS (acquired immunodeficiency syndrome).  Treatments for HIV and AIDS.  Spleen problems.  Blood disorders.  Infections, medicines, and autoimmune disorders that destroy red blood cells.  What are the signs or symptoms? Symptoms of this condition include:  Minor weakness.  Dizziness.  Headache.  Feeling heartbeats that are irregular or faster than normal (palpitations).  Shortness of breath, especially with exercise.  Paleness.  Cold sensitivity.  Indigestion.  Nausea.  Difficulty sleeping.  Difficulty concentrating.  Symptoms may occur suddenly or develop slowly. If your anemia is mild, you may not have symptoms. How is this diagnosed? This condition is diagnosed based on:  Blood tests.  Your medical history.  A physical exam.  Bone marrow biopsy.  Your health care provider may also check your stool (feces) for blood and may do additional testing to look for the cause of your bleeding. You may also have other tests, including:  Imaging tests, such as a CT scan or MRI.  Endoscopy.  Colonoscopy.  How is this treated? Treatment for this condition depends on the cause. If you continue to lose a lot of blood,  you may need to be treated at a hospital. Treatment may include:  Taking supplements of iron, vitamin T02, or folic acid.  Taking a hormone medicine (erythropoietin) that can help to stimulate red blood cell growth.  Having a blood transfusion. This may be needed if you lose a lot of blood.  Making changes to your diet.  Having surgery to remove your spleen.  Follow these instructions at home:  Take over-the-counter and prescription medicines only as told by your health care provider.  Take supplements only as told by your health care provider.  Follow any diet instructions that you were given.  Keep all follow-up visits as told by your health care provider. This is important. Contact a health care provider if:  You develop new bleeding anywhere in the body. Get help right away if:  You are very weak.  You are short of breath.  You have pain in your abdomen or chest.  You are dizzy or feel faint.  You have trouble concentrating.  You have bloody or black, tarry stools.  You vomit repeatedly or you vomit up blood. Summary  Anemia is a condition in which you do not have enough red blood cells or enough of a substance in your red blood cells that carries oxygen (hemoglobin).  Symptoms may occur suddenly or develop slowly.  If your anemia is mild, you may not have symptoms.  This condition is diagnosed with blood tests as well as a medical history and physical exam. Other tests may be needed.  Treatment for this condition depends on the cause of the anemia. This information is not intended to replace advice  given to you by your health care provider. Make sure you discuss any questions you have with your health care provider. °Document Released: 11/21/2004 Document Revised: 11/15/2016 Document Reviewed: 11/15/2016 °Elsevier Interactive Patient Education © 2018 [employer] ° °

## 2018-08-26 NOTE — Anesthesia Postprocedure Evaluation (Signed)
Anesthesia Post Note  Patient: Thomas Carlson  Procedure(s) Performed: ESOPHAGOGASTRODUODENOSCOPY (EGD) (N/A ) COLONOSCOPY (N/A )  Patient location during evaluation: Endoscopy Anesthesia Type: General Level of consciousness: awake and alert Pain management: pain level controlled Vital Signs Assessment: post-procedure vital signs reviewed and stable Respiratory status: spontaneous breathing, nonlabored ventilation, respiratory function stable and patient connected to nasal cannula oxygen Cardiovascular status: blood pressure returned to baseline and stable Postop Assessment: no apparent nausea or vomiting Anesthetic complications: no     Last Vitals:  Vitals:   08/26/18 1154 08/26/18 1214  BP: 109/72 140/61  Pulse:    Resp:    Temp: (!) 36.1 C   SpO2:      Last Pain:  Vitals:   08/26/18 1154  TempSrc: Tympanic  PainSc:                  Tahisha Hakim S

## 2018-08-26 NOTE — OR Nursing (Signed)
REPORT TO JENNIFER,RN ON 1A.PT S/P COLONOSCOPY AND UPPER ENDOSCOPY.PT WITH GASTRIC EROSIONS AND COLON POLYPS. I/S [facility] TO CALL FOR F/U APPT WITH DR Marius Ditch FOR 4WEEKS. UNDERSTANDING VOICED.

## 2018-08-26 NOTE — Anesthesia Post-op Follow-up Note (Signed)
Anesthesia QCDR form completed.        

## 2018-08-26 NOTE — Consult Note (Signed)
PHARMACY CONSULT NOTE - FOLLOW UP  Pharmacy Consult for Electrolyte Monitoring and Replacement   Labs: Recent Labs    08/23/18 1751 08/24/18 0324 08/24/18 1712 08/25/18 0549 08/25/18 1019 08/26/18 0312  WBC  --  7.6  --  7.5  --  7.9  HGB  --  8.0*  --  7.7*  --  8.0*  HCT  --  24.1*  --  23.2*  --  23.2*  PLT  --  283  --  270  --  275  CREATININE  --  1.81* 1.21 1.13  --  1.16  MG 1.7  --   --  1.8  --  1.6*  PHOS  --  2.2*  --  1.3*  --  2.0*  ALBUMIN  --   --   --   --  2.6*  --   PROT  --   --   --   --  4.9*  --   AST  --   --   --   --  27  --   ALT  --   --   --   --  39  --   ALKPHOS  --   --   --   --  47  --   BILITOT  --   --   --   --  0.7  --   BILIDIR  --   --   --   --  <0.1  --   IBILI  --   --   --   --  NOT CALCULATED  --    Potassium (mmol/L)  Date Value  08/26/2018 3.6  05/19/2014 4.3   Magnesium (mg/dL)  Date Value  08/26/2018 1.6 (L)   Phosphorus (mg/dL)  Date Value  08/26/2018 2.0 (L)   Calcium (mg/dL)  Date Value  08/26/2018 7.0 (L)  07/21/2013 8.9   Albumin  Date Value  08/25/2018 2.6 g/dL (L)  06/27/2011 4.2  ]  Estimated Creatinine Clearance: 65.8 mL/min (by C-G formula based on SCr of 1.16 mg/dL).   Assessment: Pharmacy consulted for electrolyte monitoring and replacement in 72 yo male admitted with acute renal failure related to dehydration and hypotension. Potassium on admission was 2.2. Scr improved.   Goal of Therapy:  Electrolytes WNL   Plan:  10/30 K: 3.6, Mg: 1.6, Phos: 2.0 Will order KPhos IV 55mmol x 1 dose and Magnesium 2g V x 1 dose.  No additional replacement needed at this time.    Recheck all electrolytes with AM labs.   Pernell Dupre, PharmD, BCPS Clinical Pharmacist 08/26/2018 8:35 AM

## 2018-08-26 NOTE — Op Note (Signed)
[facility] Patient Name: Thomas Carlson Procedure Date: 08/26/2018 10:53 AM MRN: [id number] Account #: [id number] Date of Birth: 02-Feb-1946 Admit Type: Inpatient Age: 72 Room: [facility] ENDO ROOM 1 Gender: Male Note Status: Finalized Procedure:            Upper GI endoscopy Indications:          Acute post hemorrhagic anemia Providers:            Lin Landsman MD, MD Referring MD:         Ria Bush (Referring MD) Medicines:            Monitored Anesthesia Care Complications:        No immediate complications. Estimated blood loss: None. Procedure:            Pre-Anesthesia Assessment:                       - Prior to the procedure, a History and Physical was                        performed, and patient medications and allergies were                        reviewed. The patient is competent. The risks and                        benefits of the procedure and the sedation options and                        risks were discussed with the patient. All questions                        were answered and informed consent was obtained.                        Patient identification and proposed procedure were                        verified by the physician, the nurse, the                        anesthesiologist, the anesthetist and the technician in                        the pre-procedure area in the procedure room in the                        endoscopy suite. Mental Status Examination: alert and                        oriented. Airway Examination: normal oropharyngeal                        airway and neck mobility. Respiratory Examination:                        clear to auscultation. CV Examination: normal.                        Prophylactic Antibiotics: The patient does not require  prophylactic antibiotics. Prior Anticoagulants: The                        patient has taken no previous anticoagulant or         antiplatelet agents. ASA Grade Assessment: III - A                        patient with severe systemic disease. After reviewing                        the risks and benefits, the patient was deemed in                        satisfactory condition to undergo the procedure. The                        anesthesia plan was to use monitored anesthesia care                        (MAC). Immediately prior to administration of                        medications, the patient was re-assessed for adequacy                        to receive sedatives. The heart rate, respiratory rate,                        oxygen saturations, blood pressure, adequacy of                        pulmonary ventilation, and response to care were                        monitored throughout the procedure. The physical status                        of the patient was re-assessed after the procedure.                       After obtaining informed consent, the endoscope was                        passed under direct vision. Throughout the procedure,                        the patient's blood pressure, pulse, and oxygen                        saturations were monitored continuously. The Endoscope                        was introduced through the mouth, and advanced to the                        second part of duodenum. The upper GI endoscopy was                        accomplished without difficulty. The patient tolerated  the procedure well. Findings:      Diffuse mild inflammation characterized by erosions was found in the       duodenal bulb.      The second portion of the duodenum was normal.      Multiple dispersed, small non-bleeding erosions were found in the       gastric antrum. There were no stigmata of recent bleeding. Biopsies were       taken with a cold forceps for Helicobacter pylori testing.      The cardia, gastric fundus, gastric body and incisura were normal.       Biopsies were  taken with a cold forceps for Helicobacter pylori testing.      Esophagogastric landmarks were identified: the gastroesophageal junction       was found at 38 cm from the incisors.      The gastroesophageal junction and examined esophagus were normal. Impression:           - Duodenitis.                       - Normal second portion of the duodenum.                       - Non-bleeding erosive gastropathy. Biopsied.                       - Normal cardia, gastric fundus, gastric body and                        incisura. Biopsied.                       - Esophagogastric landmarks identified.                       - Normal gastroesophageal junction and esophagus. Recommendation:       - Await pathology results.                       - Proceed with colonoscopy as scheduled                       See colonoscopy report Procedure Code(s):    --- Professional ---                       [phone], Esophagogastroduodenoscopy, flexible, transoral;                        with biopsy, single or multiple Diagnosis Code(s):    --- Professional ---                       K29.80, Duodenitis without bleeding                       K31.89, Other diseases of stomach and duodenum                       D62, Acute posthemorrhagic anemia CPT copyright 2018 American Medical Association. All rights reserved. The codes documented in this report are preliminary and upon coder review may  be revised to meet current compliance requirements. Dr. Ulyess Mort Lin Landsman MD, MD 08/26/2018 11:31:36 AM This report has been signed electronically. Number of  Addenda: 0 Note Initiated On: 08/26/2018 10:53 AM      [facility]

## 2018-08-26 NOTE — Anesthesia Preprocedure Evaluation (Signed)
Anesthesia Evaluation  Patient identified by MRN, date of birth, ID band Patient awake    Reviewed: Allergy & Precautions, NPO status , Patient's Chart, lab work & pertinent test results, reviewed documented beta blocker date and time   Airway Mallampati: III  TM Distance: >3 FB     Dental  (+) Chipped   Pulmonary sleep apnea ,           Cardiovascular hypertension, Pt. on medications and Pt. on home beta blockers + CAD and + Cardiac Stents  + Valvular Problems/Murmurs      Neuro/Psych Seizures -, Well Controlled,   Neuromuscular disease    GI/Hepatic   Endo/Other  diabetes, Type 2  Renal/GU Renal disease     Musculoskeletal  (+) Arthritis ,   Abdominal   Peds  Hematology   Anesthesia Other Findings Gout.Hb 8. EF 55-60 2 yrs ago. Obese.  Reproductive/Obstetrics                             Anesthesia Physical Anesthesia Plan  ASA: III  Anesthesia Plan: General   Post-op Pain Management:    Induction: Intravenous  PONV Risk Score and Plan:   Airway Management Planned:   Additional Equipment:   Intra-op Plan:   Post-operative Plan:   Informed Consent: I have reviewed the patients History and Physical, chart, labs and discussed the procedure including the risks, benefits and alternatives for the proposed anesthesia with the patient or authorized representative who has indicated his/her understanding and acceptance.     Plan Discussed with: CRNA  Anesthesia Plan Comments:         Anesthesia Quick Evaluation

## 2018-08-26 NOTE — Op Note (Addendum)
[facility] Patient Name: Thomas Carlson Procedure Date: 08/26/2018 10:52 AM MRN: [id number] Account #: [id number] Date of Birth: 10-11-1946 Admit Type: Inpatient Age: 72 Room: [facility] ENDO ROOM 1 Gender: Male Note Status: Finalized Procedure:            Colonoscopy Indications:          Rectal bleeding, Acute post hemorrhagic anemia Providers:            Lin Landsman MD, MD Referring MD:         Ria Bush (Referring MD) Medicines:            Monitored Anesthesia Care Complications:        No immediate complications. Estimated blood loss: None. Procedure:            Pre-Anesthesia Assessment:                       - Prior to the procedure, a History and Physical was                        performed, and patient medications and allergies were                        reviewed. The patient is competent. The risks and                        benefits of the procedure and the sedation options and                        risks were discussed with the patient. All questions                        were answered and informed consent was obtained.                        Patient identification and proposed procedure were                        verified by the physician, the nurse, the                        anesthesiologist, the anesthetist and the technician in                        the pre-procedure area in the procedure room in the                        endoscopy suite. Mental Status Examination: alert and                        oriented. Airway Examination: normal oropharyngeal                        airway and neck mobility. Respiratory Examination:                        clear to auscultation. CV Examination: normal.                        Prophylactic Antibiotics: The patient does not require  prophylactic antibiotics. Prior Anticoagulants: The                        patient has taken no previous anticoagulant or                   antiplatelet agents. ASA Grade Assessment: III - A                        patient with severe systemic disease. After reviewing                        the risks and benefits, the patient was deemed in                        satisfactory condition to undergo the procedure. The                        anesthesia plan was to use monitored anesthesia care                        (MAC). Immediately prior to administration of                        medications, the patient was re-assessed for adequacy                        to receive sedatives. The heart rate, respiratory rate,                        oxygen saturations, blood pressure, adequacy of                        pulmonary ventilation, and response to care were                        monitored throughout the procedure. The physical status                        of the patient was re-assessed after the procedure.                       After obtaining informed consent, the colonoscope was                        passed under direct vision. Throughout the procedure,                        the patient's blood pressure, pulse, and oxygen                        saturations were monitored continuously. The                        Colonoscope was introduced through the anus and                        advanced to the the terminal ileum. The colonoscopy was                        performed without difficulty. The  patient tolerated the                        procedure well. The quality of the bowel preparation                        was adequate. Findings:      Hemorrhoids were found on perianal exam.      The terminal ileum appeared normal.      Two sessile polyps were found in the transverse colon. The polyps were 6       to 8 mm in size. These polyps were removed with a hot snare. Resection       and retrieval were complete.      Multiple diverticula were found in the sigmoid colon. There was no       evidence of diverticular  bleeding.      Non-bleeding external and internal hemorrhoids were found during       retroflexion and during perianal exam. The hemorrhoids were large. Impression:           - Hemorrhoids found on perianal exam.                       - The examined portion of the ileum was normal.                       - Two 6 to 8 mm polyps in the transverse colon, removed                        with a hot snare. Resected and retrieved.                       - Severe diverticulosis in the sigmoid colon. There was                        no evidence of diverticular bleeding.                       - Non-bleeding external and internal hemorrhoids, with                        stigmata of recent bleeding, likely source of rectal                        bleeding. Recommendation:       - Return patient to hospital ward for possible                        discharge same day.                       - Cardiac diet and DASH diet.                       - Continue present medications.                       - Await pathology results.                       - Repeat colonoscopy in 3 years for polyp surveillance.                       -  Return to GI office with Dr Marius Ditch in 2-3 weeks for                        hemorrhoid banding. Procedure Code(s):    --- Professional ---                       [phone], Colonoscopy, flexible; with removal of tumor(s),                        polyp(s), or other lesion(s) by snare technique Diagnosis Code(s):    --- Professional ---                       K64.8, Other hemorrhoids                       D12.3, Benign neoplasm of transverse colon (hepatic                        flexure or splenic flexure)                       K62.5, Hemorrhage of anus and rectum                       D62, Acute posthemorrhagic anemia                       K57.30, Diverticulosis of large intestine without                        perforation or abscess without bleeding CPT copyright 2018 American Medical  Association. All rights reserved. The codes documented in this report are preliminary and upon coder review may  be revised to meet current compliance requirements. Dr. Ulyess Mort Lin Landsman MD, MD 08/26/2018 11:53:27 AM This report has been signed electronically. Number of Addenda: 0 Note Initiated On: 08/26/2018 10:52 AM Scope Withdrawal Time: 0 hours 12 minutes 26 seconds  Total Procedure Duration: 0 hours 15 minutes 20 seconds       [facility]

## 2018-08-26 NOTE — Care Management Important Message (Signed)
Important Message  Patient Details  Name: Thomas Carlson MRN: [id number] Date of Birth: 15-Jun-1946   Medicare Important Message Given:  Yes    Juliann Pulse A Minnette Merida 08/26/2018, 11:07 AM

## 2018-08-26 NOTE — Progress Notes (Signed)
Discharge AVS reviewed with verbal understanding. Post procedure instructions given to spouse. Escorted to personal vehicle.

## 2018-08-27 ENCOUNTER — Telehealth: Payer: Self-pay

## 2018-08-27 ENCOUNTER — Encounter: Payer: Self-pay | Admitting: Gastroenterology

## 2018-08-27 LAB — SURGICAL PATHOLOGY

## 2018-08-27 NOTE — Discharge Summary (Signed)
[facility] NAME: Thomas Carlson    MR#:  [id number]  DATE OF BIRTH:  [birth year]  DATE OF ADMISSION:  08/23/2018   ADMITTING PHYSICIAN: Sela Hua, MD  DATE OF DISCHARGE: 08/26/2018  1:37 PM  PRIMARY CARE PHYSICIAN: Ria Bush, MD   ADMISSION DIAGNOSIS:  Hypokalemia [E87.6] Hypotension [I95.9] Hypotension, unspecified hypotension type [I95.9] Gastrointestinal hemorrhage, unspecified gastrointestinal hemorrhage type [K92.2] DISCHARGE DIAGNOSIS:  Active Problems:   Acute renal failure (ARF) (HCC)   Acute posthemorrhagic anemia   Rectal bleeding  SECONDARY DIAGNOSIS:   Past Medical History:  Diagnosis Date  . Aortic valve sclerosis 08/13/2016   Sclerosis without stenosis by [place] (08/2016)  . CAD (coronary artery disease) 2005   s/p stent ([place])  . Cholelithiasis    by CT  . Chronic kidney disease, stage 3, mod decreased GFR (HCC)   . Diabetes mellitus ([place]) 2010  . Diverticulosis    by CT  . DJD (degenerative joint disease)    knee  . Genital herpes   . Gout   . Heart murmur    followed by PCP  . HLD (hyperlipidemia)   . HTN (hypertension)   . Knee pain, right    "needs replacement"  . Neuropathy    feet  . Obesity   . Seizures ([place])    x1 - after craniotomy (1960s)  . Weakness of both legs    "since back surgery"   HOSPITAL COURSE:  72 y.o.malewith a known history ofhypertension, hyperlipidemia, CKD 3, type 2 diabetes, CAD status post stent, seizures, obesity admitted for severe hypokalemia, lower extremity weakness and inability to walk   * Acute on CKD 3-likely related to hypotension and dehydration in the setting of recent initiation of Lasixweek before admission. ACE inhibitor and Meloxicamalso likely contributing.  Cr down to 1.3 -Heldlasix, quinapril, chlorthalidone, and meloxicam. Kidney function back to baseline at D/C  * GI bleeding-has had bright red blood per rectum over  the last couple of weeks. Follows with Dr. Shearon Stalls an outpatient.  - Hgb 7.7 (8.0) (baseline 12-14) - s/p endoscopy (EGD & c-scope) not showing any acute patho - Non-bleeding erosive gastropathy and duodenitis - may need further GI w/up as an outpt and or Hematology if anemia persists  * Hypokalemia- repleted and resolved. K 4.1.Likely related to Lasix use.  * Lactic acidosis- likely related to dehydration, also takes metformin as an outpatient. No signs of sepsis.  - lactic acid - improved with hydration  * Hypotension- BPs improved s/p fluid bolus. Likely due to combination of hypokalemia, hypotension, and drop in Hgb from 12 to 8.9. -Held lasix, quinapril, chlorthalidone  Lower extremity edema- had recent ECHO 08/12/18 that showed normal EF and G1DD -Ted hose and elevate legs - lasix  Type 2 diabetes-  -stable on home regimen DISCHARGE CONDITIONS:  stable CONSULTS OBTAINED:  Treatment Team:  Lin Landsman, MD DRUG ALLERGIES:   Allergies  Allergen Reactions  . Gabapentin   . Penicillins Hives    Has patient had a PCN reaction causing immediate rash, facial/tongue/throat swelling, SOB or lightheadedness with hypotension: Unknown Has patient had a PCN reaction causing severe rash involving mucus membranes or skin necrosis: Unknown Has patient had a PCN reaction that required hospitalization: Unknown Has patient had a PCN reaction occurring within the last 10 years: Unknown If all of the above answers are "NO", then may proceed with Cephalosporin use.   . Bactrim [Sulfamethoxazole-Trimethoprim] Rash  Diffuse drug reaction - maculopapular rash   DISCHARGE MEDICATIONS:   Allergies as of 08/26/2018      Reactions   Gabapentin    Penicillins Hives   Has patient had a PCN reaction causing immediate rash, facial/tongue/throat swelling, SOB or lightheadedness with hypotension: Unknown Has patient had a PCN reaction causing severe rash involving mucus membranes  or skin necrosis: Unknown Has patient had a PCN reaction that required hospitalization: Unknown Has patient had a PCN reaction occurring within the last 10 years: Unknown If all of the above answers are "NO", then may proceed with Cephalosporin use.   Bactrim [sulfamethoxazole-trimethoprim] Rash   Diffuse drug reaction - maculopapular rash      Medication List    STOP taking these medications   meloxicam 7.5 MG tablet Commonly known as:  MOBIC     TAKE these medications   acyclovir 400 MG tablet Commonly known as:  ZOVIRAX Take 1 tablet (400 mg total) by mouth 2 (two) times daily.   allopurinol 100 MG tablet Commonly known as:  ZYLOPRIM Take 1 tablet (100 mg total) by mouth daily.   aspirin EC 81 MG tablet Take 81 mg by mouth daily.   atenolol-chlorthalidone 50-25 MG tablet Commonly known as:  TENORETIC Take 1 tablet by mouth daily.   b complex vitamins tablet Take 1 tablet by mouth daily.   Biotin 1000 MCG tablet Take 1,000 mcg by mouth daily.   budesonide 3 MG 24 hr capsule Commonly known as:  ENTOCORT EC Take 3 capsules at breakfast daily   cyanocobalamin 1000 MCG/ML injection Commonly known as:  (VITAMIN B-12) Inject 1 mL (1,000 mcg total) into the muscle every 30 (thirty) days.   furosemide 20 MG tablet Commonly known as:  LASIX Take 20-40 mg by mouth daily.   lovastatin 40 MG tablet Commonly known as:  MEVACOR Take 2 tablets (80 mg total) by mouth at bedtime.   metFORMIN 500 MG tablet Commonly known as:  GLUCOPHAGE Take 1 tablet (500 mg total) by mouth 3 (three) times daily before meals.   multivitamin tablet Take 1 tablet by mouth daily.   Na Sulfate-K Sulfate-Mg Sulf 17.5-3.13-1.6 GM/177ML Soln Take 1 kit by mouth as directed.   pantoprazole 40 MG tablet Commonly known as:  PROTONIX Take 1 tablet (40 mg total) by mouth daily.   quinapril 20 MG tablet Commonly known as:  ACCUPRIL Take 1 tablet (20 mg total) by mouth at bedtime.   vitamin C  500 MG tablet Commonly known as:  ASCORBIC ACID Take 500 mg by mouth daily.   Vitamin D 2000 units Caps Take 1 capsule by mouth daily.        DISCHARGE INSTRUCTIONS:   DIET:  Regular diet DISCHARGE CONDITION:  Good ACTIVITY:  Activity as tolerated OXYGEN:  Home Oxygen: No.  Oxygen Delivery: room air DISCHARGE LOCATION:  home   If you experience worsening of your admission symptoms, develop shortness of breath, life threatening emergency, suicidal or homicidal thoughts you must seek medical attention immediately by calling 911 or calling your MD immediately  if symptoms less severe.  You Must read complete instructions/literature along with all the possible adverse reactions/side effects for all the Medicines you take and that have been prescribed to you. Take any new Medicines after you have completely understood and accpet all the possible adverse reactions/side effects.   Please note  You were cared for by a hospitalist during your hospital stay. If you have any questions about your discharge medications  or the care you received while you were in the hospital after you are discharged, you can call the unit and asked to speak with the hospitalist on call if the hospitalist that took care of you is not available. Once you are discharged, your primary care physician will handle any further medical issues. Please note that NO REFILLS for any discharge medications will be authorized once you are discharged, as it is imperative that you return to your primary care physician (or establish a relationship with a primary care physician if you do not have one) for your aftercare needs so that they can reassess your need for medications and monitor your lab values.    On the day of Discharge:  VITAL SIGNS:  Blood pressure 140/61, pulse 87, temperature (!) 97 F (36.1 C), temperature source Tympanic, resp. rate 18, height '5\' 8"'$  (1.727 m), weight 102.4 kg, SpO2 98 %. PHYSICAL EXAMINATION:    GENERAL:  72 y.o.-year-old patient lying in the bed with no acute distress.  EYES: Pupils equal, round, reactive to light and accommodation. No scleral icterus. Extraocular muscles intact.  HEENT: Head atraumatic, normocephalic. Oropharynx and nasopharynx clear.  NECK:  Supple, no jugular venous distention. No thyroid enlargement, no tenderness.  LUNGS: Normal breath sounds bilaterally, no wheezing, rales,rhonchi or crepitation. No use of accessory muscles of respiration.  CARDIOVASCULAR: S1, S2 normal. No murmurs, rubs, or gallops.  ABDOMEN: Soft, non-tender, non-distended. Bowel sounds present. No organomegaly or mass.  EXTREMITIES: No pedal edema, cyanosis, or clubbing.  NEUROLOGIC: Cranial nerves II through XII are intact. Muscle strength 5/5 in all extremities. Sensation intact. Gait not checked.  PSYCHIATRIC: The patient is alert and oriented x 3.  SKIN: No obvious rash, lesion, or ulcer.  DATA REVIEW:   CBC Recent Labs  Lab 08/26/18 0312  WBC 7.9  HGB 8.0*  HCT 23.2*  PLT 275    Chemistries  Recent Labs  Lab 08/25/18 1019 08/26/18 0312  NA  --  137  K  --  3.6  CL  --  101  CO2  --  27  GLUCOSE  --  152*  BUN  --  16  CREATININE  --  1.16  CALCIUM  --  7.0*  MG  --  1.6*  AST 27  --   ALT 39  --   ALKPHOS 47  --   BILITOT 0.7  --     Follow-up Information    Ria Bush, MD. Schedule an appointment as soon as possible for a visit in 5 day(s).   Specialty:  Family Medicine Contact information: [place] [address] [phone]        Lin Landsman, MD. Schedule an appointment as soon as possible for a visit in 2 week(s).   Specialty:  Gastroenterology Contact information: [place] [address] [phone]             Management plans discussed with the patient, family and they are in agreement.  CODE STATUS: Prior   TOTAL TIME TAKING CARE OF THIS PATIENT: 45 minutes.    Max Sane  M.D on 08/27/2018 at 8:47 PM  Between 7am to 6pm - Pager - [phone]  After 6pm go to [email or web address] - Proofreader  [facility]  Office  [phone]  CC: Primary care physician; Ria Bush, MD   Note: This dictation was prepared with Dragon dictation along with smaller phrase technology. Any transcriptional errors that result from  this process are unintentional.

## 2018-08-27 NOTE — Telephone Encounter (Signed)
Can pt come in 11 - 11:30 on the 6th?

## 2018-08-27 NOTE — Telephone Encounter (Signed)
Transition Care Management Follow-up Telephone Call   Date discharged? 08/26/2018   How have you been since you were released from the hospital? Still weak. Getting better.   Do you understand why you were in the hospital? Yes   Do you understand the discharge instructions? Yes   Where were you discharged to? Home   Items Reviewed:  Medications reviewed: Yes  Allergies reviewed: Yes  Dietary changes reviewed: Yes.  Referrals reviewed: Yes-needs to see GI and has it set up    Functional Questionnaire:   Activities of Daily Living (ADLs):   He states they are independent in the following: ambulation, feeding, toileting, grooming, continence, hygiene, bathing. States they require assistance with the following: none-able to get things independently.   Any transportation issues/concerns?: No   Any patient concerns?No   Confirmed importance and date/time of follow-up visits scheduled Yes  Provider Appointment not booked yet-sending message to Dr Danise Mina to see when we can get patient in.  Confirmed with patient if condition begins to worsen call PCP or go to the ER.  Patient was given the office number and encouraged to call back with question or concerns.  : Yes

## 2018-08-27 NOTE — Telephone Encounter (Signed)
Dr. Danise Mina patient needs a hospital follow up and per D/C note to be seen on 08/31/2018 and you are not here then. I do not see any availability for this until 09/04/2018. Please let me know if that time frame ok or to work him in sooner. TCM is done. Thank you. Patient aware you will be out of the office until 09/01/2018-Tressia Labrum V Barth Trella, RMA

## 2018-08-28 NOTE — Telephone Encounter (Signed)
Patient is coming in on 09/02/18 at [employer], RMA

## 2018-08-28 NOTE — Telephone Encounter (Signed)
Left message for patient to call back-Anastasiya V Hopkins, RMA  

## 2018-08-30 ENCOUNTER — Encounter: Payer: Self-pay | Admitting: Family Medicine

## 2018-09-02 ENCOUNTER — Ambulatory Visit (INDEPENDENT_AMBULATORY_CARE_PROVIDER_SITE_OTHER): Payer: Medicare HMO | Admitting: Family Medicine

## 2018-09-02 ENCOUNTER — Encounter: Payer: Self-pay | Admitting: Family Medicine

## 2018-09-02 VITALS — BP 128/62 | HR 70 | Temp 98.4°F | Ht 67.5 in | Wt 253.5 lb

## 2018-09-02 DIAGNOSIS — E538 Deficiency of other specified B group vitamins: Secondary | ICD-10-CM

## 2018-09-02 DIAGNOSIS — E118 Type 2 diabetes mellitus with unspecified complications: Secondary | ICD-10-CM

## 2018-09-02 DIAGNOSIS — E1122 Type 2 diabetes mellitus with diabetic chronic kidney disease: Secondary | ICD-10-CM

## 2018-09-02 DIAGNOSIS — G6289 Other specified polyneuropathies: Secondary | ICD-10-CM

## 2018-09-02 DIAGNOSIS — R600 Localized edema: Secondary | ICD-10-CM

## 2018-09-02 DIAGNOSIS — E559 Vitamin D deficiency, unspecified: Secondary | ICD-10-CM | POA: Diagnosis not present

## 2018-09-02 DIAGNOSIS — R29898 Other symptoms and signs involving the musculoskeletal system: Secondary | ICD-10-CM | POA: Diagnosis not present

## 2018-09-02 DIAGNOSIS — E8809 Other disorders of plasma-protein metabolism, not elsewhere classified: Secondary | ICD-10-CM | POA: Diagnosis not present

## 2018-09-02 DIAGNOSIS — N183 Chronic kidney disease, stage 3 unspecified: Secondary | ICD-10-CM

## 2018-09-02 DIAGNOSIS — G629 Polyneuropathy, unspecified: Secondary | ICD-10-CM | POA: Insufficient documentation

## 2018-09-02 DIAGNOSIS — K52832 Lymphocytic colitis: Secondary | ICD-10-CM | POA: Diagnosis not present

## 2018-09-02 DIAGNOSIS — K922 Gastrointestinal hemorrhage, unspecified: Secondary | ICD-10-CM

## 2018-09-02 LAB — PHOSPHORUS: Phosphorus: 4 mg/dL (ref 2.3–4.6)

## 2018-09-02 LAB — RENAL FUNCTION PANEL
ALBUMIN: 3.5 g/dL (ref 3.5–5.2)
BUN: 26 mg/dL — AB (ref 6–23)
CO2: 26 mEq/L (ref 19–32)
Calcium: 8.9 mg/dL (ref 8.4–10.5)
Chloride: 104 mEq/L (ref 96–112)
Creatinine, Ser: 1.42 mg/dL (ref 0.40–1.50)
GFR: 51.9 mL/min — ABNORMAL LOW (ref >60.00)
GLUCOSE: 133 mg/dL — AB (ref 70–99)
POTASSIUM: 3.3 meq/L — AB (ref 3.5–5.1)
Phosphorus: 4 mg/dL (ref 2.3–4.6)
SODIUM: 143 meq/L (ref 135–145)

## 2018-09-02 LAB — VITAMIN D 25 HYDROXY (VIT D DEFICIENCY, FRACTURES): VITD: 29.91 ng/mL — ABNORMAL LOW (ref 30.00–100.00)

## 2018-09-02 LAB — CORTISOL: Cortisol, Plasma: 2.8 ug/dL

## 2018-09-02 LAB — MAGNESIUM: Magnesium: 1.3 mg/dL — ABNORMAL LOW (ref 1.5–2.5)

## 2018-09-02 MED ORDER — PANTOPRAZOLE SODIUM 40 MG PO TBEC: 40.0000 mg | DELAYED_RELEASE_TABLET | Freq: Every day | ORAL | 3 refills | Status: DC

## 2018-09-02 NOTE — Assessment & Plan Note (Signed)
Metformin recently tapered up to TID dosing.

## 2018-09-02 NOTE — Assessment & Plan Note (Signed)
Progressive since lumbar decompressive surgery.  B12 returned low, s/p IM replacement with normal B12 levels but persistent symptoms.  Encouraged re eval with surgeon.  Consider neuro eval if ongoing.

## 2018-09-02 NOTE — Assessment & Plan Note (Signed)
Has stopped budesonide as states stools have now normalized. Encouraged check with GI on discontinuing steroid. Has f/u GI appt scheduled later this month.

## 2018-09-02 NOTE — Assessment & Plan Note (Addendum)
Acute worsening over the last few months, limited to lower extremity, present prior to back surgery. Check SPEP today.

## 2018-09-02 NOTE — Assessment & Plan Note (Signed)
Marked, associated with low albumin. Had reassuring echocardiogram 07/2018. Will check abd [place] eval for cirrhosis. He is now off lasix (led to ARF).

## 2018-09-02 NOTE — Progress Notes (Signed)
BP 128/62 (BP Location: Left Arm, Patient Position: Sitting, Cuff Size: Large)   Pulse 70   Temp 98.4 F (36.9 C) (Oral)   Ht 5' 7.5" (1.715 m)   Wt 253 lb 8 oz (115 kg)   SpO2 96%   BMI 39.12 kg/m    CC: hosp f/u visit Subjective:    Patient ID: Thomas Carlson, male    DOB: 01-17-1946, 72 y.o.   MRN: [id number]  HPI: Thomas Carlson is a 72 y.o. male presenting on 09/02/2018 for Hospitalization Follow-up (C/o bilateral foot swelling. )   Recent hospitalization for weakness from acute anemia from rectal bleeding, found to have acute on chronic kidney disease. Lasix, ACEI held. Had EGD/colonoscopy showing non-bleeding erosive gastropathy and duodenitis. K on admission was 2.2. For recent pedal edema, 2D echo 07/2018 showed normal EF and G1DD. Has not been taking protonix  Has f/u GI appt scheduled in 2 wks ([place]).  Known lymphocytic colitis, stopped budesonide yesterday as stools have firmed up.   Persistent foot and ankle swelling. Had some pedal edema even prior to back surgery 03/2018. Persistent marked leg weakness. Noticing increased dyspnea since he's been out of hospital.   He increased metformin to 577m tid due to elevated A1c 06/2018 (while on budesonide).  DATE OF ADMISSION:  08/23/2018   DATE OF DISCHARGE: 08/26/2018  1:37 PM TCM hosp f/u phone call performed 08/27/2018  D/C Dx Active Problems:   Ac[place]   Acute posthemorrhagic anemia   Rectal bleeding  Relevant past medical, surgical, family and social history reviewed and updated as indicated. Interim medical history since our last visit reviewed. Allergies and medications reviewed and updated. D/C meds reconciled with current home medications Outpatient Medications Prior to Visit  Medication Sig Dispense Refill  . acyclovir (ZOVIRAX) 400 MG tablet Take 1 tablet (400 mg total) by mouth 2 (two) times daily. 180 tablet 3  . allopurinol (ZYLOPRIM) 100 MG tablet Take 1 tablet (100 mg  total) by mouth daily. 90 tablet 3  . aspirin EC 81 MG tablet Take 81 mg by mouth daily.    .Marland Kitchenatenolol-chlorthalidone (TENORETIC) 50-25 MG tablet Take 1 tablet by mouth daily. 90 tablet 3  . b complex vitamins tablet Take 1 tablet by mouth daily.    . Biotin 1000 MCG tablet Take 1,000 mcg by mouth daily.     . budesonide (ENTOCORT EC) 3 MG 24 hr capsule Take 3 capsules at breakfast daily (Patient taking differently: Take 3 capsules at breakfast daily as needed) 270 capsule 3  . Cholecalciferol (VITAMIN D) 2000 units CAPS Take 1 capsule by mouth daily.    . cyanocobalamin (,VITAMIN B-12,) 1000 MCG/ML injection Inject 1 mL (1,000 mcg total) into the muscle every 30 (thirty) days. 1 mL 0  . lovastatin (MEVACOR) 40 MG tablet Take 2 tablets (80 mg total) by mouth at bedtime. 180 tablet 3  . metFORMIN (GLUCOPHAGE) 500 MG tablet Take 1 tablet (500 mg total) by mouth 3 (three) times daily before meals. 270 tablet 1  . Multiple Vitamin (MULTIVITAMIN) tablet Take 1 tablet by mouth daily.    . quinapril (ACCUPRIL) 20 MG tablet Take 1 tablet (20 mg total) by mouth at bedtime. 90 tablet 3  . vitamin C (ASCORBIC ACID) 500 MG tablet Take 500 mg by mouth daily.    . furosemide (LASIX) 20 MG tablet Take 20-40 mg by mouth daily.   11  . Na Sulfate-K Sulfate-Mg Sulf (SUPREP BOWEL PREP  KIT) 17.5-3.13-1.6 GM/177ML SOLN Take 1 kit by mouth as directed. (Patient not taking: Reported on 08/23/2018) 1 Bottle 0  . pantoprazole (PROTONIX) 40 MG tablet Take 1 tablet (40 mg total) by mouth daily. 30 tablet 0   No facility-administered medications prior to visit.      Per HPI unless specifically indicated in ROS section below Review of Systems     Objective:    BP 128/62 (BP Location: Left Arm, Patient Position: Sitting, Cuff Size: Large)   Pulse 70   Temp 98.4 F (36.9 C) (Oral)   Ht 5' 7.5" (1.715 m)   Wt 253 lb 8 oz (115 kg)   SpO2 96%   BMI 39.12 kg/m   Wt Readings from Last 3 Encounters:  09/02/18 253 lb  8 oz (115 kg)  08/26/18 225 lb 12 oz (102.4 kg)  08/17/18 251 lb (113.9 kg)    Physical Exam  Constitutional: He appears well-developed and well-nourished. No distress.  HENT:  Mouth/Throat: Oropharynx is clear and moist. No oropharyngeal exudate.  Cardiovascular: Normal rate and regular rhythm.  Murmur (3/6 systolic) heard. Pulmonary/Chest: Effort normal and breath sounds normal. No respiratory distress. He has no wheezes. He has no rales.  Abdominal: Soft. Bowel sounds are normal. He exhibits no distension and no mass. There is no hepatosplenomegaly. There is no tenderness. There is no rebound, no guarding and no CVA tenderness. No hernia.  Musculoskeletal: He exhibits edema (3+ bilateral feet, 1+ lower legs).  Neurological: He is alert. He has normal strength. He displays a negative Romberg sign.  5/5 strength BLE  Skin: Skin is warm and dry. No rash noted.  Psychiatric: He has a normal mood and affect.  Nursing note and vitals reviewed.  Lab Results  Component Value Date   WBC 7.9 08/26/2018   HGB 8.0 (L) 08/26/2018   HCT 23.2 (L) 08/26/2018   MCV 102.2 (H) 08/26/2018   PLT 275 08/26/2018    Lab Results  Component Value Date   ALT 39 08/25/2018   AST 27 08/25/2018   GGT 27 06/26/2012   ALKPHOS 47 08/25/2018   BILITOT 0.7 08/25/2018    Lab Results  Component Value Date   CREATININE 1.16 08/26/2018   BUN 16 08/26/2018   NA 137 08/26/2018   K 3.6 08/26/2018   CL 101 08/26/2018   CO2 27 08/26/2018       Assessment & Plan:   Problem List Items Addressed This Visit    Weakness of both lower extremities - Primary    Progressive since lumbar decompressive surgery.  B12 returned low, s/p IM replacement with normal B12 levels but persistent symptoms.  Encouraged re eval with surgeon.  Consider neuro eval if ongoing.       Relevant Orders   Cortisol   Vitamin D deficiency   Upper GI bleed    rec start daily protonix. Has f/u with GI later this month.        Peripheral neuropathy    Acute worsening over the last few months, limited to lower extremity, present prior to back surgery. Check SPEP today.       Relevant Orders   Serum protein electrophoresis with reflex   Lymphocytic colitis    Has stopped budesonide as states stools have now normalized. Encouraged check with GI on discontinuing steroid. Has f/u GI appt scheduled later this month.       Low vitamin B12 level    S/p replacement x2 IM without significant improvement. B12 levels  in hospital were high - will hold replacement at this time.       Edema of both feet    Marked, associated with low albumin. Had reassuring echocardiogram 07/2018. Will check abd [place] eval for cirrhosis. He is now off lasix (led to ARF).       Diabetes mellitus type 2, controlled, with complications ([place])    Metformin recently tapered up to TID dosing.       CKD stage 3 due to type 2 diabetes mellitus (HCC)    Recent ARF, Cr has normalized. Presumed diabetes related. Update renal panel today. Check abd [place] eval kidneys and liver.  Check SPEP.       Relevant Orders   Renal function panel   Parathyroid hormone, intact (no Ca)   VITAMIN D 25 Hydroxy (Vit-D Deficiency, Fractures)   Serum protein electrophoresis with reflex   US Abdomen Complete    Other Visit Diagnoses    Hypomagnesemia       Relevant Orders   Magnesium   Hypophosphatemia       Relevant Orders   Phosphorus   Hypoalbuminemia       Relevant Orders   US Abdomen Complete       Meds ordered this encounter  Medications  . pantoprazole (PROTONIX) 40 MG tablet    Sig: Take 1 tablet (40 mg total) by mouth daily.    Dispense:  30 tablet    Refill:  3   Orders Placed This Encounter  Procedures  . US Abdomen Complete    Standing Status:   Future    Standing Expiration Date:   11/03/2019    Order Specific Question:   Reason for Exam (SYMPTOM  OR DIAGNOSIS REQUIRED)    Answer:   hypoalbuminemia, pedal edema    Order Specific Question:    Preferred imaging location?    Answer:   [facility]  . Renal function panel  . Magnesium  . Phosphorus  . Parathyroid hormone, intact (no Ca)  . VITAMIN D 25 Hydroxy (Vit-D Deficiency, Fractures)  . Serum protein electrophoresis with reflex  . Cortisol    Follow up plan: No follow-ups on file.  Ria Bush, MD

## 2018-09-02 NOTE — Assessment & Plan Note (Addendum)
rec start daily protonix. Has f/u with GI later this month.

## 2018-09-02 NOTE — Assessment & Plan Note (Signed)
S/p replacement x2 IM without significant improvement. B12 levels in hospital were high - will hold replacement at this time.

## 2018-09-02 NOTE — Patient Instructions (Addendum)
Labs today See our referral coordinator to schedule abdominal ultrasound.  Continue current medicines.  Start protonix (pantoprazole)

## 2018-09-02 NOTE — Assessment & Plan Note (Addendum)
Recent ARF, Cr has normalized. Presumed diabetes related. Update renal panel today. Check abd [place] eval kidneys and liver.  Check SPEP.

## 2018-09-07 ENCOUNTER — Encounter: Payer: Self-pay | Admitting: Gastroenterology

## 2018-09-07 LAB — PROTEIN ELECTROPHORESIS, SERUM, WITH REFLEX
Albumin ELP: 3.1 g/dL — ABNORMAL LOW (ref 3.8–4.8)
Alpha 1: 0.3 g/dL (ref 0.2–0.3)
Alpha 2: 0.9 g/dL (ref 0.5–0.9)
BETA GLOBULIN: 0.4 g/dL (ref 0.4–0.6)
Beta 2: 0.2 g/dL (ref 0.2–0.5)
Gamma Globulin: 0.2 g/dL — ABNORMAL LOW (ref 0.8–1.7)
TOTAL PROTEIN: 5.2 g/dL — AB (ref 6.1–8.1)

## 2018-09-07 LAB — PARATHYROID HORMONE, INTACT (NO CA): PTH: 23 pg/mL (ref 14–64)

## 2018-09-07 LAB — FOLATE RBC: Folate, RBC: UNDETERMINED ng/mL

## 2018-09-07 LAB — HOMOCYSTEINE

## 2018-09-07 LAB — IFE INTERPRETATION: Immunofix Electr Int: NOT DETECTED

## 2018-09-08 ENCOUNTER — Other Ambulatory Visit: Payer: Self-pay | Admitting: Family Medicine

## 2018-09-08 MED ORDER — POTASSIUM CHLORIDE ER 10 MEQ PO TBCR: 10.0000 meq | EXTENDED_RELEASE_TABLET | Freq: Every day | ORAL | 3 refills | Status: DC

## 2018-09-08 MED ORDER — MAGNESIUM 400 MG PO CAPS: 1.0000 | ORAL_CAPSULE | Freq: Every day | ORAL | 3 refills | Status: DC

## 2018-09-09 ENCOUNTER — Ambulatory Visit
Admission: RE | Admit: 2018-09-09 | Discharge: 2018-09-09 | Disposition: A | Discharge status: Home / Self Care | Payer: Medicare HMO | Source: Ambulatory Visit | Attending: Family Medicine | Admitting: Family Medicine

## 2018-09-09 DIAGNOSIS — E8809 Other disorders of plasma-protein metabolism, not elsewhere classified: Secondary | ICD-10-CM | POA: Insufficient documentation

## 2018-09-09 DIAGNOSIS — N183 Chronic kidney disease, stage 3 (moderate): Secondary | ICD-10-CM | POA: Diagnosis not present

## 2018-09-09 DIAGNOSIS — E1122 Type 2 diabetes mellitus with diabetic chronic kidney disease: Secondary | ICD-10-CM | POA: Diagnosis not present

## 2018-09-15 ENCOUNTER — Ambulatory Visit: Payer: Medicare HMO | Admitting: Gastroenterology

## 2018-09-16 DIAGNOSIS — E119 Type 2 diabetes mellitus without complications: Secondary | ICD-10-CM | POA: Diagnosis not present

## 2018-09-16 DIAGNOSIS — Z0100 Encounter for examination of eyes and vision without abnormal findings: Secondary | ICD-10-CM | POA: Diagnosis not present

## 2018-09-16 LAB — HM DIABETES EYE EXAM

## 2018-09-18 ENCOUNTER — Other Ambulatory Visit: Payer: Self-pay

## 2018-09-18 ENCOUNTER — Ambulatory Visit: Payer: Medicare HMO | Admitting: Gastroenterology

## 2018-09-18 ENCOUNTER — Encounter: Payer: Self-pay | Admitting: Gastroenterology

## 2018-09-18 VITALS — BP 123/70 | HR 76 | Resp 16 | Ht 69.0 in | Wt 253.7 lb

## 2018-09-18 DIAGNOSIS — D649 Anemia, unspecified: Secondary | ICD-10-CM

## 2018-09-18 DIAGNOSIS — K641 Second degree hemorrhoids: Secondary | ICD-10-CM

## 2018-09-18 DIAGNOSIS — K625 Hemorrhage of anus and rectum: Secondary | ICD-10-CM | POA: Diagnosis not present

## 2018-09-18 DIAGNOSIS — K52832 Lymphocytic colitis: Secondary | ICD-10-CM

## 2018-09-18 NOTE — Progress Notes (Signed)
[name], MD [address]  [place]  [place], [place] [address]  Main: [phone]  Fax: [phone]    Gastroenterology Consultation  Referring Provider:     Ria Bush, MD Primary Care Physician:  Ria Bush, MD Primary Gastroenterologist:  Dr. [name] Reason for Consultation:     Internal hemorrhoids, rectal bleeding        HPI:   Thomas Carlson is a 72 y.o. male referred by Dr. Ria Bush, MD  for consultation & management of symptomatic internal hemorrhoids.  Patient has history of stage III CKD, coronary artery disease who was admitted to [facility] on 08/24/1989 secondary to rectal bleeding, symptomatic anemia.  He was experiencing rectal bleeding for 2 weeks prior to presentation to the hospital.  Patient has history of lymphocytic colitis taking budesonide.  Patient reports painless hematochezia, hemoglobin on admission was 8.6, baseline 12.7 in 05/2018.  His potassium was 2.2 with AKI on CKD on admission.  Patient underwent upper endoscopy and colonoscopy.  EGD was unremarkable, colonoscopy revealed lymphocytic colitis again as well as large internal hemorrhoids.  He is here to discuss about hemorrhoid ligation.  Patient reports that his energy levels are significantly better, swelling of legs has significantly improved since discharge.  He is no longer experiencing rectal bleeding.  NSAIDs: None  Antiplts/Anticoagulants/Anti thrombotics: None  GI Procedures: Colonoscopy by Dr. Allen Norris 10/2015 - The examined portion of the ileum was normal. Biopsied. - Diverticulosis in the sigmoid colon. - Non-bleeding internal hemorrhoids. - Random biopsies were obtained in the entire colon. Diagnosis 1. Colon, biopsy, terminal ileum - BENIGN SMALL BOWEL MUCOSA. NO VILLOUS ATROPHY, INFLAMMATION OR OTHER ABNORMALITIES PRESENT. 2. Colon, biopsy, random colon - BENIGN COLONIC MUCOSA WITH FEATURES CONSISTENT WITH MICROSCOPIC COLITIS, SEE  COMMENT.  EGD 08/26/2018 - Duodenitis. - Normal second portion of the duodenum. - Non-bleeding erosive gastropathy. Biopsied. - Normal cardia, gastric fundus, gastric body and incisura. Biopsied. - Esophagogastric landmarks identified. - Normal gastroesophageal junction and esophagus.  Colonoscopy 08/26/2018 - Hemorrhoids found on perianal exam. - The examined portion of the ileum was normal. - Two 6 to 8 mm polyps in the transverse colon, removed with a hot snare. Resected and retrieved. - Severe diverticulosis in the sigmoid colon. There was no evidence of diverticular bleeding. - Non-bleeding external and internal hemorrhoids, with stigmata of recent bleeding, likely source of rectal Bleeding.  DIAGNOSIS:  A. STOMACH, RANDOM; COLD BIOPSY:  - ANTRAL MUCOSA WITH MILD REACTIVE GASTRITIS.  - UNREMARKABLE OXYNTIC MUCOSA.  - NEGATIVE FOR H. PYLORI, DYSPLASIA, AND MALIGNANCY.   B. COLON POLYP X2, TRANSVERSE; HOT SNARE:  - TUBULAR ADENOMA (MULTIPLE FRAGMENTS).  - NEGATIVE FOR HIGH-GRADE DYSPLASIA AND MALIGNANCY.   Past Medical History:  Diagnosis Date  . Aortic valve sclerosis 08/13/2016   Sclerosis without stenosis by [place] (08/2016)  . CAD (coronary artery disease) 2005   s/p stent ([place])  . Cholelithiasis    by CT  . Chronic kidney disease, stage 3, mod decreased GFR (HCC)   . Diabetes mellitus ([place]) 2010  . Diverticulosis    by CT  . DJD (degenerative joint disease)    knee  . Genital herpes   . Gout   . Heart murmur    followed by PCP  . HLD (hyperlipidemia)   . HTN (hypertension)   . Knee pain, right    "needs replacement"  . Neuropathy    feet  . Obesity   . Seizures ([place])    x1 -  after craniotomy (1960s)  . Weakness of both legs    "since back surgery"    Past Surgical History:  Procedure Laterality Date  . APPENDECTOMY  1958  . CLOSED REDUCTION CLAVICLE FRACTURE  1955  . COLONOSCOPY  02/2008   int hemorrhoids, diverticula (Dr. Allen Norris)  . COLONOSCOPY N/A  08/26/2018   TA, (Helio Lack, [employer])  . COLONOSCOPY WITH PROPOFOL N/A 11/10/2015   diverticulosis, path with microscopic colitis [name], MD)  . CORONARY ANGIOPLASTY WITH STENT PLACEMENT  2005   stent 2005  . DECOMPRESSIVE LUMBAR LAMINECTOMY LEVEL 1  03/2018   herniated L4/5 disc R with free fargment over L4 s/p surgery L4 ([place])  . ESOPHAGOGASTRODUODENOSCOPY N/A 08/26/2018   reactive gastritis ([place])  . HERNIA REPAIR  1956  . KNEE ARTHROSCOPY  2008   torn meniscus  . [place]   hit in helmet by baseball    Current Outpatient Medications:  .  acyclovir (ZOVIRAX) 400 MG tablet, Take 1 tablet (400 mg total) by mouth 2 (two) times daily., Disp: 180 tablet, Rfl: 3 .  allopurinol (ZYLOPRIM) 100 MG tablet, Take 1 tablet (100 mg total) by mouth daily., Disp: 90 tablet, Rfl: 3 .  aspirin EC 81 MG tablet, Take 81 mg by mouth daily., Disp: , Rfl:  .  atenolol-chlorthalidone (TENORETIC) 50-25 MG tablet, Take 1 tablet by mouth daily., Disp: 90 tablet, Rfl: 3 .  b complex vitamins tablet, Take 1 tablet by mouth daily., Disp: , Rfl:  .  Biotin 1000 MCG tablet, Take 1,000 mcg by mouth daily. , Disp: , Rfl:  .  budesonide (ENTOCORT EC) 3 MG 24 hr capsule, Take 3 capsules at breakfast daily (Patient taking differently: Take 3 capsules at breakfast daily as needed), Disp: 270 capsule, Rfl: 3 .  Cholecalciferol (VITAMIN D) 2000 units CAPS, Take 1 capsule by mouth daily., Disp: , Rfl:  .  cyanocobalamin (,VITAMIN B-12,) 1000 MCG/ML injection, Inject 1 mL (1,000 mcg total) into the muscle every 30 (thirty) days., Disp: 1 mL, Rfl: 0 .  lovastatin (MEVACOR) 40 MG tablet, Take 2 tablets (80 mg total) by mouth at bedtime., Disp: 180 tablet, Rfl: 3 .  Magnesium 400 MG CAPS, Take 1 capsule by mouth daily., Disp: 30 capsule, Rfl: 3 .  meloxicam (MOBIC) 7.5 MG tablet, , Disp: , Rfl:  .  metFORMIN (GLUCOPHAGE) 500 MG tablet, Take 1 tablet (500 mg  total) by mouth 3 (three) times daily before meals., Disp: 270 tablet, Rfl: 1 .  Multiple Vitamin (MULTIVITAMIN) tablet, Take 1 tablet by mouth daily., Disp: , Rfl:  .  potassium chloride (K-DUR) 10 MEQ tablet, Take 1 tablet (10 mEq total) by mouth daily., Disp: 30 tablet, Rfl: 3 .  quinapril (ACCUPRIL) 20 MG tablet, Take 1 tablet (20 mg total) by mouth at bedtime., Disp: 90 tablet, Rfl: 3 .  vitamin C (ASCORBIC ACID) 500 MG tablet, Take 500 mg by mouth daily., Disp: , Rfl:  .  pantoprazole (PROTONIX) 40 MG tablet, Take 1 tablet (40 mg total) by mouth daily. (Patient not taking: Reported on 09/18/2018), Disp: 30 tablet, Rfl: 3   Family History  Problem Relation Age of Onset  . Diabetes Father 77  . Coronary artery disease Sister        catheterizations  . COPD Brother   . Stroke Brother   . Hypertension Brother   . Cancer Neg Hx      Social History   Tobacco Use  .  Smoking status: Never Smoker  . Smokeless tobacco: Never Used  Substance Use Topics  . Alcohol use: No    Alcohol/week: 0.0 standard drinks  . Drug use: No    Allergies as of 09/18/2018 - Review Complete 09/18/2018  Allergen Reaction Noted  . Gabapentin  08/19/2018  . Lasix [furosemide]  09/02/2018  . Penicillins Hives 06/18/2012  . Bactrim [sulfamethoxazole-trimethoprim] Rash 05/19/2013    Review of Systems:    All systems reviewed and negative except where noted in HPI.   Physical Exam:  BP 123/70 (BP Location: Left Arm, Patient Position: Sitting, Cuff Size: Large)   Pulse 76   Resp 16   Ht 5\' 9"  (1.753 m)   Wt 253 lb 10.4 oz (115.1 kg)   BMI 37.46 kg/m  No LMP for male patient.  General:   Alert,  Well-developed, well-nourished, pleasant and cooperative in NAD Head:  Normocephalic and atraumatic. Eyes:  Sclera clear, no icterus.   Conjunctiva pink. Ears:  Normal auditory acuity. Nose:  No deformity, discharge, or lesions. Mouth:  No deformity or lesions,oropharynx pink & moist. Neck:  Supple; no  masses or thyromegaly. Lungs:  Respirations even and unlabored.  Clear throughout to auscultation.   No wheezes, crackles, or rhonchi. No acute distress. Heart:  Regular rate and rhythm; no murmurs, clicks, rubs, or gallops. Abdomen:  Normal bowel sounds. Soft, obese, non-tender and non-distended without masses, hepatosplenomegaly or hernias noted.  No guarding or rebound tenderness.   Rectal: Nontender, large internal hemorrhoids Msk:  Symmetrical without gross deformities. Good, equal movement & strength bilaterally. Pulses:  Normal pulses noted. Extremities:  No clubbing, bilateral swelling of feet.  No cyanosis. Neurologic:  Alert and oriented x3;  grossly normal neurologically. Skin:  Intact without significant lesions or rashes. No jaundice. Psych:  Alert and cooperative. Normal mood and affect.  Imaging Studies: Reviewed  Assessment and Plan:   JENCARLOS NICOLSON is a 72 y.o. Caucasian male with lymphocytic colitis, chronic kidney disease, coronary artery disease with rectal bleeding secondary to internal hemorrhoids resulting in anemia  Large internal hemorrhoids leading to painless rectal bleeding Discussed with patient about risks and benefits of outpatient hemorrhoid ligation, patient is agreeable to undergo procedure today Consent obtained, perform hemorrhoid ligation today  Normocytic anemia Patient had normal iron, B12 and folate levels Recheck CBC  Lymphocytic colitis Continue budesonide 3 mg capsules 3 pills daily   Follow up in 2 weeks   [name], MD

## 2018-09-18 NOTE — Progress Notes (Signed)

## 2018-09-21 ENCOUNTER — Encounter: Payer: Self-pay | Admitting: Family Medicine

## 2018-09-21 DIAGNOSIS — D649 Anemia, unspecified: Secondary | ICD-10-CM | POA: Diagnosis not present

## 2018-09-22 LAB — CBC
Hematocrit: 32.5 % — ABNORMAL LOW (ref 37.5–51.0)
Hemoglobin: 11.2 g/dL — ABNORMAL LOW (ref 13.0–17.7)
MCH: 34.5 pg — AB (ref 26.6–33.0)
MCHC: 34.5 g/dL (ref 31.5–35.7)
MCV: 100 fL — AB (ref 79–97)
PLATELETS: 387 10*3/uL (ref 150–450)
RBC: 3.25 x10E6/uL — ABNORMAL LOW (ref 4.14–5.80)
RDW: 13.4 % (ref 12.3–15.4)
WBC: 11.2 10*3/uL — AB (ref 3.4–10.8)

## 2018-10-01 DIAGNOSIS — I10 Essential (primary) hypertension: Secondary | ICD-10-CM | POA: Diagnosis not present

## 2018-10-01 DIAGNOSIS — R600 Localized edema: Secondary | ICD-10-CM | POA: Diagnosis not present

## 2018-10-01 DIAGNOSIS — I25118 Atherosclerotic heart disease of native coronary artery with other forms of angina pectoris: Secondary | ICD-10-CM | POA: Diagnosis not present

## 2018-10-01 DIAGNOSIS — E1122 Type 2 diabetes mellitus with diabetic chronic kidney disease: Secondary | ICD-10-CM | POA: Diagnosis not present

## 2018-10-01 DIAGNOSIS — E782 Mixed hyperlipidemia: Secondary | ICD-10-CM | POA: Diagnosis not present

## 2018-10-01 DIAGNOSIS — G4733 Obstructive sleep apnea (adult) (pediatric): Secondary | ICD-10-CM | POA: Diagnosis not present

## 2018-10-01 DIAGNOSIS — N183 Chronic kidney disease, stage 3 (moderate): Secondary | ICD-10-CM | POA: Diagnosis not present

## 2018-10-02 DIAGNOSIS — M48061 Spinal stenosis, lumbar region without neurogenic claudication: Secondary | ICD-10-CM | POA: Diagnosis not present

## 2018-10-02 DIAGNOSIS — M1711 Unilateral primary osteoarthritis, right knee: Secondary | ICD-10-CM | POA: Diagnosis not present

## 2018-10-06 ENCOUNTER — Other Ambulatory Visit (INDEPENDENT_AMBULATORY_CARE_PROVIDER_SITE_OTHER): Payer: Medicare HMO

## 2018-10-06 ENCOUNTER — Other Ambulatory Visit: Payer: Self-pay | Admitting: Family Medicine

## 2018-10-06 LAB — MAGNESIUM: MAGNESIUM: 2 mg/dL (ref 1.5–2.5)

## 2018-10-06 LAB — RENAL FUNCTION PANEL
Albumin: 3.5 g/dL (ref 3.5–5.2)
BUN: 32 mg/dL — AB (ref 6–23)
CALCIUM: 8.7 mg/dL (ref 8.4–10.5)
CO2: 32 meq/L (ref 19–32)
CREATININE: 1.18 mg/dL (ref 0.40–1.50)
Chloride: 103 mEq/L (ref 96–112)
GFR: 64.24 mL/min (ref >60.00)
Glucose, Bld: 182 mg/dL — ABNORMAL HIGH (ref 70–99)
PHOSPHORUS: 3 mg/dL (ref 2.3–4.6)
Potassium: 2.9 mEq/L — ABNORMAL LOW (ref 3.5–5.1)
Sodium: 143 mEq/L (ref 135–145)

## 2018-10-07 ENCOUNTER — Ambulatory Visit: Payer: Medicare HMO | Admitting: Gastroenterology

## 2018-10-07 ENCOUNTER — Encounter: Payer: Self-pay | Admitting: Gastroenterology

## 2018-10-07 VITALS — BP 158/74 | HR 66 | Ht 69.0 in | Wt 255.0 lb

## 2018-10-07 DIAGNOSIS — K529 Noninfective gastroenteritis and colitis, unspecified: Secondary | ICD-10-CM | POA: Diagnosis not present

## 2018-10-07 DIAGNOSIS — K641 Second degree hemorrhoids: Secondary | ICD-10-CM

## 2018-10-07 NOTE — Progress Notes (Signed)

## 2018-10-07 NOTE — Progress Notes (Signed)
[name], MD [address]  [place]  [place], [place] [address]  Main: [phone]  Fax: [phone]    Gastroenterology Consultation  Referring Provider:     Ria Bush, MD Primary Care Physician:  Ria Bush, MD Primary Gastroenterologist:  Dr. [name] Reason for Consultation:     Internal hemorrhoids, rectal bleeding, worsening diarrhea        HPI:   Thomas Carlson is a 72 y.o. male referred by Dr. Ria Bush, MD  for consultation & management of symptomatic internal hemorrhoids.  Patient has history of stage III CKD, coronary artery disease who was admitted to [facility] on 08/24/1989 secondary to rectal bleeding, symptomatic anemia.  He was experiencing rectal bleeding for 2 weeks prior to presentation to the hospital.  Patient has history of lymphocytic colitis taking budesonide.  Patient reports painless hematochezia, hemoglobin on admission was 8.6, baseline 12.7 in 05/2018.  His potassium was 2.2 with AKI on CKD on admission.  Patient underwent upper endoscopy and colonoscopy.  EGD was unremarkable, colonoscopy revealed lymphocytic colitis again as well as large internal hemorrhoids.  He is here to discuss about hemorrhoid ligation.  Patient reports that his energy levels are significantly better, swelling of legs has significantly improved since discharge.  He is no longer experiencing rectal bleeding.  Follow-up visit 1119 He underwent first hemorrhoid ligation about 2 weeks ago.  Denies any rectal bleeding.  He continues to have watery diarrhea, 3-4 times daily, denies abdominal pain, nausea or vomiting.  He is taking budesonide 3 mg 3 pills with breakfast for microscopic colitis.  Otherwise, his hemoglobin has significantly improved since hospital discharge.  NSAIDs: None  Antiplts/Anticoagulants/Anti thrombotics: None  GI Procedures: Colonoscopy by Dr. Allen Norris 10/2015 - The examined portion of the ileum was normal. Biopsied. -  Diverticulosis in the sigmoid colon. - Non-bleeding internal hemorrhoids. - Random biopsies were obtained in the entire colon. Diagnosis 1. Colon, biopsy, terminal ileum - BENIGN SMALL BOWEL MUCOSA. NO VILLOUS ATROPHY, INFLAMMATION OR OTHER ABNORMALITIES PRESENT. 2. Colon, biopsy, random colon - BENIGN COLONIC MUCOSA WITH FEATURES CONSISTENT WITH MICROSCOPIC COLITIS, SEE COMMENT.  EGD 08/26/2018 - Duodenitis. - Normal second portion of the duodenum. - Non-bleeding erosive gastropathy. Biopsied. - Normal cardia, gastric fundus, gastric body and incisura. Biopsied. - Esophagogastric landmarks identified. - Normal gastroesophageal junction and esophagus.  Colonoscopy 08/26/2018 - Hemorrhoids found on perianal exam. - The examined portion of the ileum was normal. - Two 6 to 8 mm polyps in the transverse colon, removed with a hot snare. Resected and retrieved. - Severe diverticulosis in the sigmoid colon. There was no evidence of diverticular bleeding. - Non-bleeding external and internal hemorrhoids, with stigmata of recent bleeding, likely source of rectal Bleeding.  DIAGNOSIS:  A. STOMACH, RANDOM; COLD BIOPSY:  - ANTRAL MUCOSA WITH MILD REACTIVE GASTRITIS.  - UNREMARKABLE OXYNTIC MUCOSA.  - NEGATIVE FOR H. PYLORI, DYSPLASIA, AND MALIGNANCY.   B. COLON POLYP X2, TRANSVERSE; HOT SNARE:  - TUBULAR ADENOMA (MULTIPLE FRAGMENTS).  - NEGATIVE FOR HIGH-GRADE DYSPLASIA AND MALIGNANCY.   Past Medical History:  Diagnosis Date  . Acute posthemorrhagic anemia   . Aortic valve sclerosis 08/13/2016   Sclerosis without stenosis by [place] (08/2016)  . CAD (coronary artery disease) 2005   s/p stent ([facility])  . Cholelithiasis    by CT  . Chronic kidney disease, stage 3, mod decreased GFR (HCC)   . Diabetes mellitus ([place]) 2010  . Diverticulosis    by CT  . DJD (  degenerative joint disease)    knee  . Genital herpes   . Gout   . Heart murmur    followed by PCP  . HLD (hyperlipidemia)   .  HTN (hypertension)   . Knee pain, right    "needs replacement"  . Neuropathy    feet  . Obesity   . Seizures ([place])    x1 - after craniotomy (1960s)  . Weakness of both legs    "since back surgery"    Past Surgical History:  Procedure Laterality Date  . APPENDECTOMY  1958  . CLOSED REDUCTION CLAVICLE FRACTURE  1955  . COLONOSCOPY  02/2008   int hemorrhoids, diverticula (Dr. Allen Norris)  . COLONOSCOPY N/A 08/26/2018   TA, (Vanga, [employer])  . COLONOSCOPY WITH PROPOFOL N/A 11/10/2015   diverticulosis, path with microscopic colitis [name], MD)  . CORONARY ANGIOPLASTY WITH STENT PLACEMENT  2005   stent 2005  . DECOMPRESSIVE LUMBAR LAMINECTOMY LEVEL 1  03/2018   herniated L4/5 disc R with free fargment over L4 s/p surgery L4 ([place])  . ESOPHAGOGASTRODUODENOSCOPY N/A 08/26/2018   reactive gastritis (Vanga, Rohini Reddy)  . HERNIA REPAIR  1956  . KNEE ARTHROSCOPY  2008   torn meniscus  . [place]   hit in helmet by baseball    Current Outpatient Medications:  .  acyclovir (ZOVIRAX) 400 MG tablet, Take 1 tablet (400 mg total) by mouth 2 (two) times daily., Disp: 180 tablet, Rfl: 3 .  allopurinol (ZYLOPRIM) 100 MG tablet, Take 1 tablet (100 mg total) by mouth daily., Disp: 90 tablet, Rfl: 3 .  aspirin EC 81 MG tablet, Take 81 mg by mouth daily., Disp: , Rfl:  .  atenolol-chlorthalidone (TENORETIC) 50-25 MG tablet, Take 1 tablet by mouth daily., Disp: 90 tablet, Rfl: 3 .  b complex vitamins tablet, Take 1 tablet by mouth daily., Disp: , Rfl:  .  Biotin 1000 MCG tablet, Take 1,000 mcg by mouth daily. , Disp: , Rfl:  .  budesonide (ENTOCORT EC) 3 MG 24 hr capsule, Take 3 capsules at breakfast daily (Patient taking differently: Take 3 capsules at breakfast daily as needed), Disp: 270 capsule, Rfl: 3 .  Cholecalciferol (VITAMIN D) 2000 units CAPS, Take 1 capsule by mouth daily., Disp: , Rfl:  .  cyanocobalamin (,VITAMIN B-12,) 1000 MCG/ML  injection, Inject 1 mL (1,000 mcg total) into the muscle every 30 (thirty) days., Disp: 1 mL, Rfl: 0 .  lovastatin (MEVACOR) 40 MG tablet, Take 2 tablets (80 mg total) by mouth at bedtime., Disp: 180 tablet, Rfl: 3 .  Magnesium 400 MG CAPS, Take 1 capsule by mouth daily., Disp: 30 capsule, Rfl: 3 .  meloxicam (MOBIC) 7.5 MG tablet, , Disp: , Rfl:  .  metFORMIN (GLUCOPHAGE) 500 MG tablet, Take 1 tablet (500 mg total) by mouth 3 (three) times daily before meals., Disp: 270 tablet, Rfl: 1 .  Multiple Vitamin (MULTIVITAMIN) tablet, Take 1 tablet by mouth daily., Disp: , Rfl:  .  pantoprazole (PROTONIX) 40 MG tablet, Take 1 tablet (40 mg total) by mouth daily., Disp: 30 tablet, Rfl: 3 .  potassium chloride (K-DUR) 10 MEQ tablet, Take 1 tablet (10 mEq total) by mouth daily., Disp: 30 tablet, Rfl: 3 .  quinapril (ACCUPRIL) 20 MG tablet, Take 1 tablet (20 mg total) by mouth at bedtime., Disp: 90 tablet, Rfl: 3 .  vitamin C (ASCORBIC ACID) 500 MG tablet, Take 500 mg by mouth daily., Disp: , Rfl:  Family History  Problem Relation Age of Onset  . Diabetes Father 58  . Coronary artery disease Sister        catheterizations  . COPD Brother   . Stroke Brother   . Hypertension Brother   . Cancer Neg Hx      Social History   Tobacco Use  . Smoking status: Never Smoker  . Smokeless tobacco: Never Used  Substance Use Topics  . Alcohol use: No    Alcohol/week: 0.0 standard drinks  . Drug use: No    Allergies as of 10/07/2018 - Review Complete 10/07/2018  Allergen Reaction Noted  . Gabapentin  08/19/2018  . Lasix [furosemide]  09/02/2018  . Penicillins Hives 06/18/2012  . Bactrim [sulfamethoxazole-trimethoprim] Rash 05/19/2013    Review of Systems:    All systems reviewed and negative except where noted in HPI.   Physical Exam:  BP (!) 158/74   Pulse 66   Ht 5\' 9"  (1.753 m)   Wt 255 lb (115.7 kg)   BMI 37.66 kg/m  No LMP for male patient.  General:   Alert,  Well-developed,  well-nourished, pleasant and cooperative in NAD Head:  Normocephalic and atraumatic. Eyes:  Sclera clear, no icterus.   Conjunctiva pink. Ears:  Normal auditory acuity. Nose:  No deformity, discharge, or lesions. Mouth:  No deformity or lesions,oropharynx pink & moist. Neck:  Supple; no masses or thyromegaly. Lungs:  Respirations even and unlabored.  Clear throughout to auscultation.   No wheezes, crackles, or rhonchi. No acute distress. Heart:  Regular rate and rhythm; no murmurs, clicks, rubs, or gallops. Abdomen:  Normal bowel sounds. Soft, obese, non-tender and non-distended without masses, hepatosplenomegaly or hernias noted.  No guarding or rebound tenderness.   Rectal: Nontender, large internal hemorrhoids Msk:  Symmetrical without gross deformities. Good, equal movement & strength bilaterally. Pulses:  Normal pulses noted. Extremities:  No clubbing, bilateral swelling of feet.  No cyanosis. Neurologic:  Alert and oriented x3;  grossly normal neurologically. Skin:  Intact without significant lesions or rashes. No jaundice. Psych:  Alert and cooperative. Normal mood and affect.  Imaging Studies: Reviewed  Assessment and Plan:   Thomas Carlson is a 72 y.o. Caucasian male with lymphocytic colitis, chronic kidney disease, coronary artery disease with rectal bleeding secondary to internal hemorrhoids resulting in anemia  Large internal hemorrhoids leading to painless rectal bleeding Consent obtained, perform second hemorrhoid ligation today  Normocytic anemia Patient had normal iron, B12 and folate levels Hemoglobin is improving  Lymphocytic colitis Continue budesonide 3 mg capsules 3 pills daily Due to worsening diarrhea, check stool studies to rule out infection and pancreatic fecal elastase   Follow up in 4 weeks   [name], MD

## 2018-10-08 ENCOUNTER — Other Ambulatory Visit: Payer: Self-pay | Admitting: Family Medicine

## 2018-10-08 DIAGNOSIS — M1711 Unilateral primary osteoarthritis, right knee: Secondary | ICD-10-CM | POA: Diagnosis not present

## 2018-10-08 MED ORDER — POTASSIUM CHLORIDE ER 20 MEQ PO TBCR: 20.0000 meq | EXTENDED_RELEASE_TABLET | Freq: Every day | ORAL | 1 refills | Status: DC

## 2018-10-09 ENCOUNTER — Other Ambulatory Visit: Payer: Self-pay | Admitting: Orthopedic Surgery

## 2018-10-11 ENCOUNTER — Encounter: Payer: Self-pay | Admitting: Family Medicine

## 2018-10-11 NOTE — Progress Notes (Signed)
BP 136/60 (BP Location: Left Arm, Patient Position: Sitting, Cuff Size: Large)   Pulse 66   Temp 98.2 F (36.8 C) (Oral)   Ht 5' 7.5" (1.715 m)   Wt 249 lb (112.9 kg)   SpO2 96%   BMI 38.42 kg/m    CC: 1 mo f/u visit  Subjective:    Patient ID: Thomas Carlson, male    DOB: 05-May-1946, 72 y.o.   MRN: [id number]  HPI: Thomas Carlson is a 72 y.o. male presenting on 10/12/2018 for Follow-up (Here for 4-6 wk f/u. Pt thinks he is due for vit B12 inj. However, in [place] notes from 09/02/18 state hold replacement for now. )   See prior note for details.  Planned knee replacement surgery 10/2018.   Ongoing leg weakness and foot swelling that started after lumbar surgery 03/2018 - saw ortho surgeon without cause found. Takes B complex vitamin. Received one b12 shot 06/2018. Mild paresthesias of feet. No numbness or burning pain. Especially notes trouble getting pants and socks on in the morning.   Compliant with magnesium 400mg  daily, Kdur 68mEq bid for 5 days.  HTN - on quinapril 20mg  daily, atenolol/chlorthalidone.   Lymphocytic colitis complicated by rectal bleeding from internal hemorrhoids s/p recent hosptialization - back on budesonide 9mg  daily. Saw GI s/p 2 hemorrhoid ligations. Pending fecal elastase and GI stool pathogen panel for ongoing diarrhea. GI has limited red meat.    Marked pedal edema - saw cardiology ([facility]) and was recommended low sodium diet <2000 mg/day.  CAD deemed stable (s/p remote LAD stent).   DM - does regularly check sugars qod. Compliant with antihyperglycemic regimen which includes: metformin 500mg  tid. Denies low sugars or hypoglycemic symptoms. Denies paresthesias. Last diabetic eye exam 08/2018. Pneumovax: 2013. Prevnar: 2017. Glucometer brand: accu check. DSME: would like to defer return until after surgery. Lab Results  Component Value Date   HGBA1C 6.5 10/12/2018   Diabetic Foot Exam - Simple   No data filed     No results found for: Derl Barrow   COLONOSCOPY 08/26/2018 - TA (Vanga, Tally Due)  Relevant past medical, surgical, family and social history reviewed and updated as indicated. Interim medical history since our last visit reviewed. Allergies and medications reviewed and updated. Outpatient Medications Prior to Visit  Medication Sig Dispense Refill  . acyclovir (ZOVIRAX) 400 MG tablet Take 1 tablet (400 mg total) by mouth 2 (two) times daily. 180 tablet 3  . allopurinol (ZYLOPRIM) 100 MG tablet Take 1 tablet (100 mg total) by mouth daily. 90 tablet 3  . aspirin EC 81 MG tablet Take 81 mg by mouth daily.    Marland Kitchen atenolol-chlorthalidone (TENORETIC) 50-25 MG tablet Take 1 tablet by mouth daily. 90 tablet 3  . b complex vitamins tablet Take 1 tablet by mouth daily.    . Biotin 1000 MCG tablet Take 1,000 mcg by mouth daily.     . budesonide (ENTOCORT EC) 3 MG 24 hr capsule Take 3 capsules at breakfast daily (Patient taking differently: Take 3 capsules at breakfast daily as needed) 270 capsule 3  . Cholecalciferol (VITAMIN D) 2000 units CAPS Take 1 capsule by mouth daily.    . cyanocobalamin (,VITAMIN B-12,) 1000 MCG/ML injection Inject 1 mL (1,000 mcg total) into the muscle every 30 (thirty) days. 1 mL 0  . lovastatin (MEVACOR) 40 MG tablet Take 2 tablets (80 mg total) by mouth at bedtime. 180 tablet 3  . Magnesium 400 MG CAPS Take 1 capsule  by mouth daily. 30 capsule 3  . meloxicam (MOBIC) 7.5 MG tablet     . metFORMIN (GLUCOPHAGE) 500 MG tablet Take 1 tablet (500 mg total) by mouth 3 (three) times daily before meals. 270 tablet 1  . Multiple Vitamin (MULTIVITAMIN) tablet Take 1 tablet by mouth daily.    . pantoprazole (PROTONIX) 40 MG tablet Take 1 tablet (40 mg total) by mouth daily. 30 tablet 3  . potassium chloride 20 MEQ TBCR Take 20 mEq by mouth daily. 90 tablet 1  . quinapril (ACCUPRIL) 20 MG tablet Take 1 tablet (20 mg total) by mouth at bedtime. 90 tablet 3  . vitamin C (ASCORBIC ACID) 500 MG tablet Take 500  mg by mouth daily.     No facility-administered medications prior to visit.      Per HPI unless specifically indicated in ROS section below Review of Systems     Objective:    BP 136/60 (BP Location: Left Arm, Patient Position: Sitting, Cuff Size: Large)   Pulse 66   Temp 98.2 F (36.8 C) (Oral)   Ht 5' 7.5" (1.715 m)   Wt 249 lb (112.9 kg)   SpO2 96%   BMI 38.42 kg/m   Wt Readings from Last 3 Encounters:  10/12/18 249 lb (112.9 kg)  10/07/18 255 lb (115.7 kg)  09/18/18 253 lb 10.4 oz (115.1 kg)    Physical Exam Vitals signs and nursing note reviewed.  Constitutional:      General: He is not in acute distress.    Appearance: Normal appearance.  HENT:     Head: Normocephalic and atraumatic.     Mouth/Throat:     Mouth: Mucous membranes are moist.     Pharynx: No oropharyngeal exudate.  Eyes:     Pupils: Pupils are equal, round, and reactive to light.  Cardiovascular:     Rate and Rhythm: Normal rate and regular rhythm.     Heart sounds: Murmur (3/6 systolic) present.  Pulmonary:     Effort: Pulmonary effort is normal. No respiratory distress.     Breath sounds: Normal breath sounds. No wheezing, rhonchi or rales.  Abdominal:     General: Bowel sounds are normal. There is no distension.     Palpations: There is no mass.     Tenderness: There is no abdominal tenderness. There is no guarding or rebound.     Hernia: No hernia is present.     Comments: No HSM  Musculoskeletal:     Right lower leg: Edema present.     Left lower leg: Edema present.     Comments: 3+ bilateral feet, tr into ankles  Skin:    General: Skin is warm and dry.     Findings: No rash.  Neurological:     Mental Status: He is alert.     Comments: 5/5 strength BLE  Sensation intact  Psychiatric:        Mood and Affect: Mood normal.    Results for orders placed or performed in visit on 10/12/18  Renal function panel  Result Value Ref Range   Sodium 140 135 - 145 mEq/L   Potassium 3.0 (L)  3.5 - 5.1 mEq/L   Chloride 100 96 - 112 mEq/L   CO2 29 19 - 32 mEq/L   Calcium 8.9 8.4 - 10.5 mg/dL   Albumin 3.7 3.5 - 5.2 g/dL   BUN 19 6 - 23 mg/dL   Creatinine, Ser 1.04 0.40 - 1.50 mg/dL   Glucose, Bld 133 (  H) 70 - 99 mg/dL   Phosphorus 2.4 2.3 - 4.6 mg/dL   GFR 74.32 >60.00 mL/min  TSH  Result Value Ref Range   TSH 2.45 0.35 - 4.50 uIU/mL  Hemoglobin A1c  Result Value Ref Range   Hgb A1c MFr Bld 6.5 4.6 - 6.5 %  Fructosamine  Result Value Ref Range   Fructosamine 219 205 - 285 umol/L  Aldolase  Result Value Ref Range   Aldolase 4.9 < OR = 8.1 U/L  CK  Result Value Ref Range   Total CK 59 7 - 232 U/L  Vitamin B12  Result Value Ref Range   Vitamin B-12 193 (L) 211 - 911 pg/mL      Assessment & Plan:   Problem List Items Addressed This Visit    Weakness of both lower extremities - Primary    Ongoing progressive subjective weakness since lumbar surgery. Ortho doesn't think weakness is related to surgery however.  He has received a few B12 injections - will update labs.  Check CPK, aldolase.       Relevant Orders   TSH (Completed)   Aldolase (Completed)   CK (Completed)   Vitamin B12 (Completed)   Vitamin B12 deficiency    Received IM replacement earlier this year - will update b12 and consider ongoing IM replacement.       Relevant Orders   Vitamin B12 (Completed)   Right knee pain    Seeing ortho, planning upcoming knee replacement.       Peripheral neuropathy   Lymphocytic colitis    Followed by GI - continues budesonide. Concern glucocorticoid is contributing to pedal edema.       HTN (hypertension)    Chronic, stable. Continue current regimen which includes quinapril 20mg  daily, atenolol/chlorthalidone 50/25mg  daily.       Edema of both feet    Ongoing, of unclear cause.  Previously with low albumin, low b12.  Update labs today.  This may be glucocorticoid related as it may have started around the time he started budesonide for lymphocytic  colitis. Consider endo eval.       Relevant Orders   TSH (Completed)   Diabetes mellitus type 2, controlled, with complications (HCC)    Continues metformin 500mg  tid - update A1c today.       Relevant Orders   Hemoglobin A1c (Completed)   Fructosamine (Completed)   CKD stage 3 due to type 2 diabetes mellitus (HCC)    Recent SPEP without M spike.  Update Cr today.       Relevant Orders   Renal function panel (Completed)   CAD (coronary artery disease)    Sees cards Dr Ubaldo Glassing, recently cleared for knee replacement.           No orders of the defined types were placed in this encounter.  Orders Placed This Encounter  Procedures  . Renal function panel  . TSH  . Hemoglobin A1c  . Fructosamine  . Aldolase  . CK  . Vitamin B12    Follow up plan: No follow-ups on file.  Ria Bush, MD

## 2018-10-12 ENCOUNTER — Ambulatory Visit (INDEPENDENT_AMBULATORY_CARE_PROVIDER_SITE_OTHER): Payer: Medicare HMO | Admitting: Family Medicine

## 2018-10-12 ENCOUNTER — Encounter: Payer: Self-pay | Admitting: Family Medicine

## 2018-10-12 VITALS — BP 136/60 | HR 66 | Temp 98.2°F | Ht 67.5 in | Wt 249.0 lb

## 2018-10-12 DIAGNOSIS — R29898 Other symptoms and signs involving the musculoskeletal system: Secondary | ICD-10-CM

## 2018-10-12 DIAGNOSIS — E538 Deficiency of other specified B group vitamins: Secondary | ICD-10-CM | POA: Diagnosis not present

## 2018-10-12 DIAGNOSIS — N183 Chronic kidney disease, stage 3 (moderate): Secondary | ICD-10-CM | POA: Diagnosis not present

## 2018-10-12 DIAGNOSIS — I2510 Atherosclerotic heart disease of native coronary artery without angina pectoris: Secondary | ICD-10-CM | POA: Diagnosis not present

## 2018-10-12 DIAGNOSIS — E118 Type 2 diabetes mellitus with unspecified complications: Secondary | ICD-10-CM | POA: Diagnosis not present

## 2018-10-12 DIAGNOSIS — I10 Essential (primary) hypertension: Secondary | ICD-10-CM | POA: Diagnosis not present

## 2018-10-12 DIAGNOSIS — R600 Localized edema: Secondary | ICD-10-CM

## 2018-10-12 DIAGNOSIS — G6289 Other specified polyneuropathies: Secondary | ICD-10-CM

## 2018-10-12 DIAGNOSIS — K52832 Lymphocytic colitis: Secondary | ICD-10-CM | POA: Diagnosis not present

## 2018-10-12 DIAGNOSIS — M25561 Pain in right knee: Secondary | ICD-10-CM

## 2018-10-12 DIAGNOSIS — E1122 Type 2 diabetes mellitus with diabetic chronic kidney disease: Secondary | ICD-10-CM

## 2018-10-12 DIAGNOSIS — G8929 Other chronic pain: Secondary | ICD-10-CM

## 2018-10-12 LAB — RENAL FUNCTION PANEL
Albumin: 3.7 g/dL (ref 3.5–5.2)
BUN: 19 mg/dL (ref 6–23)
CO2: 29 mEq/L (ref 19–32)
Calcium: 8.9 mg/dL (ref 8.4–10.5)
Chloride: 100 mEq/L (ref 96–112)
Creatinine, Ser: 1.04 mg/dL (ref 0.40–1.50)
GFR: 74.32 mL/min (ref >60.00)
Glucose, Bld: 133 mg/dL — ABNORMAL HIGH (ref 70–99)
Phosphorus: 2.4 mg/dL (ref 2.3–4.6)
Potassium: 3 mEq/L — ABNORMAL LOW (ref 3.5–5.1)
Sodium: 140 mEq/L (ref 135–145)

## 2018-10-12 LAB — HEMOGLOBIN A1C: Hgb A1c MFr Bld: 6.5 % (ref 4.6–6.5)

## 2018-10-12 LAB — CK: Total CK: 59 U/L (ref 7–232)

## 2018-10-12 LAB — VITAMIN B12: Vitamin B-12: 193 pg/mL — ABNORMAL LOW (ref 211–911)

## 2018-10-12 LAB — TSH: TSH: 2.45 u[IU]/mL (ref 0.35–4.50)

## 2018-10-12 NOTE — Patient Instructions (Addendum)
Labs today.  Continue potassium twice daily.  We may change blood pressure medicine pending lab results.  We may refer you to specialist pending lab results.

## 2018-10-13 DIAGNOSIS — K529 Noninfective gastroenteritis and colitis, unspecified: Secondary | ICD-10-CM | POA: Diagnosis not present

## 2018-10-15 ENCOUNTER — Other Ambulatory Visit: Payer: Self-pay | Admitting: Family Medicine

## 2018-10-15 LAB — ALDOLASE: ALDOLASE: 4.9 U/L (ref <=8.1)

## 2018-10-15 LAB — FRUCTOSAMINE: Fructosamine: 219 umol/L (ref 205–285)

## 2018-10-15 MED ORDER — POTASSIUM CHLORIDE ER 20 MEQ PO TBCR: 20.0000 meq | EXTENDED_RELEASE_TABLET | Freq: Two times a day (BID) | ORAL | 1 refills | Status: DC

## 2018-10-15 NOTE — Assessment & Plan Note (Signed)
Received IM replacement earlier this year - will update b12 and consider ongoing IM replacement.

## 2018-10-15 NOTE — Assessment & Plan Note (Addendum)
Recent SPEP without M spike.  Update Cr today.

## 2018-10-15 NOTE — Assessment & Plan Note (Signed)
Chronic, stable. Continue current regimen which includes quinapril 20mg  daily, atenolol/chlorthalidone 50/25mg  daily.

## 2018-10-15 NOTE — Assessment & Plan Note (Addendum)
Continues metformin 500mg  tid - update A1c today.

## 2018-10-15 NOTE — Assessment & Plan Note (Addendum)
Ongoing progressive subjective weakness since lumbar surgery. Ortho doesn't think weakness is related to surgery however.  He has received a few B12 injections - will update labs.  Check CPK, aldolase.

## 2018-10-15 NOTE — Assessment & Plan Note (Signed)
Followed by GI - continues budesonide. Concern glucocorticoid is contributing to pedal edema.

## 2018-10-15 NOTE — Assessment & Plan Note (Signed)
Ongoing, of unclear cause.  Previously with low albumin, low b12.  Update labs today.  This may be glucocorticoid related as it may have started around the time he started budesonide for lymphocytic colitis. Consider endo eval.

## 2018-10-15 NOTE — Assessment & Plan Note (Signed)
Seeing ortho, planning upcoming knee replacement.

## 2018-10-15 NOTE — Assessment & Plan Note (Signed)
Sees cards Dr Ubaldo Glassing, recently cleared for knee replacement.

## 2018-10-16 LAB — GI PROFILE, STOOL, PCR
Adenovirus F 40/41: DETECTED — AB
Astrovirus: NOT DETECTED
C difficile toxin A/B: NOT DETECTED
CYCLOSPORA CAYETANENSIS: NOT DETECTED
Campylobacter: NOT DETECTED
Cryptosporidium: NOT DETECTED
ENTAMOEBA HISTOLYTICA: NOT DETECTED
Enteroaggregative E coli: NOT DETECTED
Enteropathogenic E coli: NOT DETECTED
Enterotoxigenic E coli: NOT DETECTED
Giardia lamblia: NOT DETECTED
Norovirus GI/GII: NOT DETECTED
PLESIOMONAS SHIGELLOIDES: NOT DETECTED
Rotavirus A: NOT DETECTED
SALMONELLA: NOT DETECTED
SAPOVIRUS: NOT DETECTED
SHIGELLA/ENTEROINVASIVE E COLI: NOT DETECTED
Shiga-toxin-producing E coli: NOT DETECTED
VIBRIO CHOLERAE: NOT DETECTED
VIBRIO: NOT DETECTED
Yersinia enterocolitica: NOT DETECTED

## 2018-10-16 LAB — PANCREATIC ELASTASE, FECAL: Pancreatic Elastase, Fecal: >500 ug Elast./g (ref >200)

## 2018-10-17 ENCOUNTER — Other Ambulatory Visit: Payer: Self-pay | Admitting: Family Medicine

## 2018-10-17 DIAGNOSIS — R29898 Other symptoms and signs involving the musculoskeletal system: Secondary | ICD-10-CM

## 2018-10-17 DIAGNOSIS — R600 Localized edema: Secondary | ICD-10-CM

## 2018-10-17 DIAGNOSIS — K52832 Lymphocytic colitis: Secondary | ICD-10-CM

## 2018-10-17 DIAGNOSIS — G6289 Other specified polyneuropathies: Secondary | ICD-10-CM

## 2018-10-19 DIAGNOSIS — M48061 Spinal stenosis, lumbar region without neurogenic claudication: Secondary | ICD-10-CM | POA: Diagnosis not present

## 2018-10-21 ENCOUNTER — Encounter: Payer: Self-pay | Admitting: Family Medicine

## 2018-10-22 DIAGNOSIS — M48061 Spinal stenosis, lumbar region without neurogenic claudication: Secondary | ICD-10-CM | POA: Diagnosis not present

## 2018-10-29 ENCOUNTER — Encounter
Admission: RE | Admit: 2018-10-29 | Discharge: 2018-10-29 | Disposition: A | Discharge status: Home / Self Care | Payer: Medicare HMO | Source: Ambulatory Visit | Attending: Orthopedic Surgery | Admitting: Orthopedic Surgery

## 2018-10-29 ENCOUNTER — Ambulatory Visit
Admission: RE | Admit: 2018-10-29 | Discharge: 2018-10-29 | Disposition: A | Discharge status: Home / Self Care | Payer: Medicare HMO | Source: Ambulatory Visit | Attending: Orthopedic Surgery | Admitting: Orthopedic Surgery

## 2018-10-29 ENCOUNTER — Other Ambulatory Visit: Payer: Self-pay

## 2018-10-29 DIAGNOSIS — Z01811 Encounter for preprocedural respiratory examination: Secondary | ICD-10-CM

## 2018-10-29 DIAGNOSIS — Z01818 Encounter for other preprocedural examination: Secondary | ICD-10-CM | POA: Diagnosis not present

## 2018-10-29 LAB — TYPE AND SCREEN
ABO/RH(D): O POS
Antibody Screen: NEGATIVE

## 2018-10-29 LAB — CBC WITH DIFFERENTIAL/PLATELET
Abs Immature Granulocytes: 0.05 10*3/uL (ref 0.00–0.07)
Basophils Absolute: 0 10*3/uL (ref 0.0–0.1)
Basophils Relative: 0 %
Eosinophils Absolute: 0 10*3/uL (ref 0.0–0.5)
Eosinophils Relative: 0 %
HCT: 36.9 % — ABNORMAL LOW (ref 39.0–52.0)
Hemoglobin: 12.5 g/dL — ABNORMAL LOW (ref 13.0–17.0)
IMMATURE GRANULOCYTES: 1 %
Lymphocytes Relative: 12 %
Lymphs Abs: 1.2 10*3/uL (ref 0.7–4.0)
MCH: 33.2 pg (ref 26.0–34.0)
MCHC: 33.9 g/dL (ref 30.0–36.0)
MCV: 97.9 fL (ref 80.0–100.0)
Monocytes Absolute: 0.8 10*3/uL (ref 0.1–1.0)
Monocytes Relative: 8 %
NEUTROS PCT: 79 %
NRBC: 0 % (ref 0.0–0.2)
Neutro Abs: 7.6 10*3/uL (ref 1.7–7.7)
Platelets: 268 10*3/uL (ref 150–400)
RBC: 3.77 MIL/uL — ABNORMAL LOW (ref 4.22–5.81)
RDW: 13.5 % (ref 11.5–15.5)
WBC: 9.7 10*3/uL (ref 4.0–10.5)

## 2018-10-29 LAB — BASIC METABOLIC PANEL
ANION GAP: 10 (ref 5–15)
BUN: 28 mg/dL — ABNORMAL HIGH (ref 8–23)
CO2: 23 mmol/L (ref 22–32)
Calcium: 8.5 mg/dL — ABNORMAL LOW (ref 8.9–10.3)
Chloride: 106 mmol/L (ref 98–111)
Creatinine, Ser: 1.04 mg/dL (ref 0.61–1.24)
GFR calc non Af Amer: >60 mL/min (ref >60)
Glucose, Bld: 153 mg/dL — ABNORMAL HIGH (ref 70–99)
Potassium: 3.1 mmol/L — ABNORMAL LOW (ref 3.5–5.1)
Sodium: 139 mmol/L (ref 135–145)

## 2018-10-29 LAB — APTT: aPTT: 24 seconds (ref 24–36)

## 2018-10-29 LAB — PROTIME-INR
INR: 0.97
Prothrombin Time: 12.8 seconds (ref 11.4–15.2)

## 2018-10-29 NOTE — Patient Instructions (Signed)
Your procedure is scheduled on: Thurs. 11/12/18 Report to Day Surgery. To find out your arrival time please call [phone] between [place] on Wed. 11/11/18.  Remember: Instructions that are not followed completely may result in serious medical risk,  up to and including death, or upon the discretion of your surgeon and anesthesiologist your  surgery may need to be rescheduled.     _X__ 1. Do not eat food after midnight the night before your procedure.                 No gum chewing or hard candies. You may drink clear liquids up to 2 hours                 before you are scheduled to arrive for your surgery- DO not drink clear                 liquids within 2 hours of the start of your surgery.                 Clear Liquids include:  water,  __X__2.  On the morning of surgery brush your teeth with toothpaste and water, you                may rinse your mouth with mouthwash if you wish.  Do not swallow any toothpaste of mouthwash.     ___ 3.  No Alcohol for 24 hours before or after surgery.   ___ 4.  Do Not Smoke or use e-cigarettes For 24 Hours Prior to Your Surgery.                 Do not use any chewable tobacco products for at least 6 hours prior to                 surgery.  ____  5.  Bring all medications with you on the day of surgery if instructed.   __x__  6.  Notify your doctor if there is any change in your medical condition      (cold, fever, infections).     Do not wear jewelry, make-up, hairpins, clips or nail polish. Do not wear lotions, powders, or perfumes. You may wear deodorant. Do not shave 48 hours prior to surgery. Men may shave face and neck. Do not bring valuables to the hospital.    [facility] is not responsible for any belongings or valuables.  Contacts, dentures or bridgework may not be worn into surgery. Leave your suitcase in the car. After surgery it may be brought to your room. For patients admitted to the hospital,  discharge time is determined by your treatment team.   Patients discharged the day of surgery will not be allowed to drive home.   Please read over the following fact sheets that you were given:    _x___ Take these medicines the morning of surgery with A SIP OF WATER:    1. allopurinol (ZYLOPRIM) 100 MG tablet  2. budesonide (ENTOCORT EC) 3 MG 24 hr capsule  3. pantoprazole (PROTONIX) 40 MG tablet take dose the night before and the morning of surgery  4.  5.  6.  ____ Fleet Enema (as directed)   __x__ Use CHG Soap as directed  ____ Use inhalers on the day of surgery  __x__ Stop metformin 2 days prior to surgery last dose on 1/13    ____ Take 1/2 of usual insulin dose the night before surgery. No insulin the  morning          of surgery.   _x___ Stop aspirin on 11/05/18  _x___ Stop Anti-inflammatories meloxicam (MOBIC) 7.5 MG tablet on 11/05/18 May take tylenol   _x___ Stop supplements until after surgery.  Biotin 1000 MCG tablet,vitamin C (ASCORBIC ACID) 500 MG tablet on 11/05/18  ____ Bring C-Pap to the hospital.

## 2018-10-30 DIAGNOSIS — M47817 Spondylosis without myelopathy or radiculopathy, lumbosacral region: Secondary | ICD-10-CM | POA: Diagnosis not present

## 2018-11-04 ENCOUNTER — Ambulatory Visit (INDEPENDENT_AMBULATORY_CARE_PROVIDER_SITE_OTHER): Payer: Medicare HMO

## 2018-11-04 DIAGNOSIS — E538 Deficiency of other specified B group vitamins: Secondary | ICD-10-CM

## 2018-11-04 MED ORDER — CYANOCOBALAMIN 1000 MCG/ML IJ SOLN
1000.0000 ug | Freq: Once | INTRAMUSCULAR | Status: AC
  Administered 2018-11-04: 1000 ug via INTRAMUSCULAR

## 2018-11-04 NOTE — Progress Notes (Signed)
Pt given 1000mg /ml B12 in Left Deltoid. Tolerated well. Advised him to go ahead ans schedule his next 2 weekly shots on his way out.

## 2018-11-10 ENCOUNTER — Telehealth: Payer: Self-pay

## 2018-11-10 NOTE — Telephone Encounter (Signed)
Pt is scheduled 11/11/18 for 2nd B12 inj. Pt is scheduled for total knee replacement on 11/12/18. Pt said at pre op visit the nurse was very restrictive about other medications and pt wants to know should he get the B 12 on 11/11/18. [facility] will be doing the surgery. Pt request cb.

## 2018-11-10 NOTE — Telephone Encounter (Signed)
Spoke with pt relaying Dr. G's message. Pt verbalizes understanding.  

## 2018-11-10 NOTE — Telephone Encounter (Signed)
Should be ok to do shot. Thanks.

## 2018-11-11 ENCOUNTER — Ambulatory Visit (INDEPENDENT_AMBULATORY_CARE_PROVIDER_SITE_OTHER): Payer: Medicare HMO | Admitting: *Deleted

## 2018-11-11 DIAGNOSIS — E538 Deficiency of other specified B group vitamins: Secondary | ICD-10-CM | POA: Diagnosis not present

## 2018-11-11 MED ORDER — CLINDAMYCIN PHOSPHATE 900 MG/50ML IV SOLN
900.0000 mg | Freq: Once | INTRAVENOUS | Status: AC
  Administered 2018-11-12: 900 mg via INTRAVENOUS

## 2018-11-11 MED ORDER — VANCOMYCIN HCL 10 G IV SOLR
1500.0000 mg | INTRAVENOUS | Status: AC
  Administered 2018-11-12: 1500 mg via INTRAVENOUS
  Filled 2018-11-11: qty 1500

## 2018-11-11 MED ORDER — CYANOCOBALAMIN 1000 MCG/ML IJ SOLN
1000.0000 ug | Freq: Once | INTRAMUSCULAR | Status: AC
  Administered 2018-11-11: 1000 ug via INTRAMUSCULAR

## 2018-11-11 NOTE — Progress Notes (Signed)
Per orders of Dr. Danise Mina, injection of b12 given by Modena Nunnery. Patient tolerated injection well.

## 2018-11-12 ENCOUNTER — Inpatient Hospital Stay: Payer: Medicare HMO | Admitting: Anesthesiology

## 2018-11-12 ENCOUNTER — Inpatient Hospital Stay: Payer: Medicare HMO

## 2018-11-12 ENCOUNTER — Encounter
Admission: RE | Disposition: A | Discharge status: Skilled Nursing Facility | Payer: Self-pay | Source: Home / Self Care | Attending: Orthopedic Surgery

## 2018-11-12 ENCOUNTER — Other Ambulatory Visit: Payer: Self-pay

## 2018-11-12 ENCOUNTER — Inpatient Hospital Stay
Admission: RE | Admit: 2018-11-12 | Discharge: 2018-11-17 | DRG: 470 | Disposition: A | Discharge status: Skilled Nursing Facility | Payer: Medicare HMO | Attending: Orthopedic Surgery | Admitting: Orthopedic Surgery

## 2018-11-12 DIAGNOSIS — E785 Hyperlipidemia, unspecified: Secondary | ICD-10-CM | POA: Diagnosis present

## 2018-11-12 DIAGNOSIS — Z833 Family history of diabetes mellitus: Secondary | ICD-10-CM

## 2018-11-12 DIAGNOSIS — Z823 Family history of stroke: Secondary | ICD-10-CM | POA: Diagnosis not present

## 2018-11-12 DIAGNOSIS — M1711 Unilateral primary osteoarthritis, right knee: Principal | ICD-10-CM | POA: Diagnosis present

## 2018-11-12 DIAGNOSIS — Z7982 Long term (current) use of aspirin: Secondary | ICD-10-CM | POA: Diagnosis not present

## 2018-11-12 DIAGNOSIS — I2510 Atherosclerotic heart disease of native coronary artery without angina pectoris: Secondary | ICD-10-CM | POA: Diagnosis present

## 2018-11-12 DIAGNOSIS — L03115 Cellulitis of right lower limb: Secondary | ICD-10-CM | POA: Diagnosis not present

## 2018-11-12 DIAGNOSIS — N179 Acute kidney failure, unspecified: Secondary | ICD-10-CM | POA: Diagnosis not present

## 2018-11-12 DIAGNOSIS — I951 Orthostatic hypotension: Secondary | ICD-10-CM | POA: Diagnosis not present

## 2018-11-12 DIAGNOSIS — R339 Retention of urine, unspecified: Secondary | ICD-10-CM | POA: Diagnosis not present

## 2018-11-12 DIAGNOSIS — N183 Chronic kidney disease, stage 3 (moderate): Secondary | ICD-10-CM | POA: Diagnosis not present

## 2018-11-12 DIAGNOSIS — X58XXXA Exposure to other specified factors, initial encounter: Secondary | ICD-10-CM | POA: Diagnosis not present

## 2018-11-12 DIAGNOSIS — M2550 Pain in unspecified joint: Secondary | ICD-10-CM | POA: Diagnosis not present

## 2018-11-12 DIAGNOSIS — R531 Weakness: Secondary | ICD-10-CM | POA: Diagnosis not present

## 2018-11-12 DIAGNOSIS — Z880 Allergy status to penicillin: Secondary | ICD-10-CM

## 2018-11-12 DIAGNOSIS — Z791 Long term (current) use of non-steroidal anti-inflammatories (NSAID): Secondary | ICD-10-CM | POA: Diagnosis not present

## 2018-11-12 DIAGNOSIS — R0602 Shortness of breath: Secondary | ICD-10-CM

## 2018-11-12 DIAGNOSIS — S40022A Contusion of left upper arm, initial encounter: Secondary | ICD-10-CM | POA: Diagnosis not present

## 2018-11-12 DIAGNOSIS — E1140 Type 2 diabetes mellitus with diabetic neuropathy, unspecified: Secondary | ICD-10-CM | POA: Diagnosis not present

## 2018-11-12 DIAGNOSIS — Z6838 Body mass index (BMI) 38.0-38.9, adult: Secondary | ICD-10-CM | POA: Diagnosis not present

## 2018-11-12 DIAGNOSIS — E669 Obesity, unspecified: Secondary | ICD-10-CM | POA: Diagnosis present

## 2018-11-12 DIAGNOSIS — Z7984 Long term (current) use of oral hypoglycemic drugs: Secondary | ICD-10-CM

## 2018-11-12 DIAGNOSIS — Z8249 Family history of ischemic heart disease and other diseases of the circulatory system: Secondary | ICD-10-CM

## 2018-11-12 DIAGNOSIS — I358 Other nonrheumatic aortic valve disorders: Secondary | ICD-10-CM | POA: Diagnosis present

## 2018-11-12 DIAGNOSIS — S40021A Contusion of right upper arm, initial encounter: Secondary | ICD-10-CM | POA: Diagnosis not present

## 2018-11-12 DIAGNOSIS — E1122 Type 2 diabetes mellitus with diabetic chronic kidney disease: Secondary | ICD-10-CM | POA: Diagnosis not present

## 2018-11-12 DIAGNOSIS — Z7401 Bed confinement status: Secondary | ICD-10-CM | POA: Diagnosis not present

## 2018-11-12 DIAGNOSIS — I10 Essential (primary) hypertension: Secondary | ICD-10-CM | POA: Diagnosis not present

## 2018-11-12 DIAGNOSIS — I129 Hypertensive chronic kidney disease with stage 1 through stage 4 chronic kidney disease, or unspecified chronic kidney disease: Secondary | ICD-10-CM | POA: Diagnosis present

## 2018-11-12 DIAGNOSIS — E119 Type 2 diabetes mellitus without complications: Secondary | ICD-10-CM | POA: Diagnosis not present

## 2018-11-12 DIAGNOSIS — R338 Other retention of urine: Secondary | ICD-10-CM | POA: Diagnosis not present

## 2018-11-12 DIAGNOSIS — T8141XA Infection following a procedure, superficial incisional surgical site, initial encounter: Secondary | ICD-10-CM | POA: Diagnosis not present

## 2018-11-12 DIAGNOSIS — R52 Pain, unspecified: Secondary | ICD-10-CM | POA: Diagnosis not present

## 2018-11-12 DIAGNOSIS — Z888 Allergy status to other drugs, medicaments and biological substances status: Secondary | ICD-10-CM | POA: Diagnosis not present

## 2018-11-12 DIAGNOSIS — I25118 Atherosclerotic heart disease of native coronary artery with other forms of angina pectoris: Secondary | ICD-10-CM | POA: Diagnosis not present

## 2018-11-12 DIAGNOSIS — Z79899 Other long term (current) drug therapy: Secondary | ICD-10-CM

## 2018-11-12 DIAGNOSIS — Z955 Presence of coronary angioplasty implant and graft: Secondary | ICD-10-CM

## 2018-11-12 DIAGNOSIS — Z471 Aftercare following joint replacement surgery: Secondary | ICD-10-CM | POA: Diagnosis not present

## 2018-11-12 DIAGNOSIS — S8012XA Contusion of left lower leg, initial encounter: Secondary | ICD-10-CM | POA: Diagnosis not present

## 2018-11-12 DIAGNOSIS — Z825 Family history of asthma and other chronic lower respiratory diseases: Secondary | ICD-10-CM | POA: Diagnosis not present

## 2018-11-12 DIAGNOSIS — E1142 Type 2 diabetes mellitus with diabetic polyneuropathy: Secondary | ICD-10-CM | POA: Diagnosis present

## 2018-11-12 DIAGNOSIS — Z882 Allergy status to sulfonamides status: Secondary | ICD-10-CM | POA: Diagnosis not present

## 2018-11-12 DIAGNOSIS — K5900 Constipation, unspecified: Secondary | ICD-10-CM | POA: Diagnosis not present

## 2018-11-12 DIAGNOSIS — E1159 Type 2 diabetes mellitus with other circulatory complications: Secondary | ICD-10-CM | POA: Diagnosis not present

## 2018-11-12 DIAGNOSIS — Z96651 Presence of right artificial knee joint: Secondary | ICD-10-CM | POA: Diagnosis not present

## 2018-11-12 DIAGNOSIS — S8011XA Contusion of right lower leg, initial encounter: Secondary | ICD-10-CM | POA: Diagnosis not present

## 2018-11-12 HISTORY — PX: TOTAL KNEE ARTHROPLASTY: SHX125

## 2018-11-12 LAB — GLUCOSE, CAPILLARY
Glucose-Capillary: 183 mg/dL — ABNORMAL HIGH (ref 70–99)
Glucose-Capillary: 176 mg/dL — ABNORMAL HIGH (ref 70–99)

## 2018-11-12 LAB — SURGICAL PCR SCREEN
MRSA, PCR: NEGATIVE
Staphylococcus aureus: NEGATIVE

## 2018-11-12 SURGERY — ARTHROPLASTY, KNEE, TOTAL
Anesthesia: Spinal | Site: Knee | Laterality: Right

## 2018-11-12 MED ORDER — BUPIVACAINE LIPOSOME 1.3 % IJ SUSP
INTRAMUSCULAR | Status: AC
  Filled 2018-11-12: qty 20

## 2018-11-12 MED ORDER — EPINEPHRINE PF 1 MG/ML IJ SOLN
INTRAMUSCULAR | Status: AC
  Filled 2018-11-12: qty 1

## 2018-11-12 MED ORDER — LIDOCAINE HCL (PF) 2 % IJ SOLN
INTRAMUSCULAR | Status: AC
  Filled 2018-11-12: qty 10

## 2018-11-12 MED ORDER — ASPIRIN EC 81 MG PO TBEC
81.0000 mg | DELAYED_RELEASE_TABLET | Freq: Every day | ORAL | Status: DC
  Administered 2018-11-12 – 2018-11-16 (×5): 81 mg via ORAL
  Filled 2018-11-12 (×5): qty 1

## 2018-11-12 MED ORDER — FENTANYL CITRATE (PF) 100 MCG/2ML IJ SOLN
INTRAMUSCULAR | Status: AC
  Filled 2018-11-12: qty 2

## 2018-11-12 MED ORDER — OXYCODONE HCL 5 MG PO TABS: 5.0000 mg | ORAL_TABLET | Freq: Once | ORAL | Status: DC | PRN

## 2018-11-12 MED ORDER — POTASSIUM CHLORIDE CRYS ER 20 MEQ PO TBCR
20.0000 meq | EXTENDED_RELEASE_TABLET | Freq: Two times a day (BID) | ORAL | Status: DC
  Administered 2018-11-12 – 2018-11-17 (×10): 20 meq via ORAL
  Filled 2018-11-12 (×10): qty 1

## 2018-11-12 MED ORDER — ATENOLOL 25 MG PO TABS
50.0000 mg | ORAL_TABLET | Freq: Every day | ORAL | Status: DC
  Administered 2018-11-13 – 2018-11-16 (×3): 50 mg via ORAL
  Filled 2018-11-12 (×4): qty 2

## 2018-11-12 MED ORDER — QUINAPRIL HCL 10 MG PO TABS
20.0000 mg | ORAL_TABLET | Freq: Every day | ORAL | Status: DC
  Administered 2018-11-12 – 2018-11-14 (×3): 20 mg via ORAL
  Filled 2018-11-12 (×4): qty 2

## 2018-11-12 MED ORDER — SODIUM CHLORIDE 0.9 % IV SOLN
INTRAVENOUS | Status: DC
  Administered 2018-11-12 (×2): via INTRAVENOUS

## 2018-11-12 MED ORDER — OXYCODONE HCL 5 MG PO TABS
5.0000 mg | ORAL_TABLET | ORAL | Status: DC | PRN
  Administered 2018-11-12 – 2018-11-17 (×5): 10 mg via ORAL
  Filled 2018-11-12 (×6): qty 2

## 2018-11-12 MED ORDER — TRANEXAMIC ACID 1000 MG/10ML IV SOLN
INTRAVENOUS | Status: AC
  Filled 2018-11-12: qty 10

## 2018-11-12 MED ORDER — DOCUSATE SODIUM 100 MG PO CAPS
100.0000 mg | ORAL_CAPSULE | Freq: Two times a day (BID) | ORAL | Status: DC
  Administered 2018-11-12 – 2018-11-16 (×7): 100 mg via ORAL
  Filled 2018-11-12 (×9): qty 1

## 2018-11-12 MED ORDER — PHENYLEPHRINE HCL 10 MG/ML IJ SOLN
INTRAMUSCULAR | Status: AC
  Filled 2018-11-12: qty 1

## 2018-11-12 MED ORDER — NEOMYCIN-POLYMYXIN B GU 40-200000 IR SOLN
Status: AC
  Filled 2018-11-12: qty 1

## 2018-11-12 MED ORDER — OXYCODONE HCL 5 MG PO TABS
10.0000 mg | ORAL_TABLET | ORAL | Status: DC | PRN
  Administered 2018-11-14 – 2018-11-16 (×5): 10 mg via ORAL
  Filled 2018-11-12 (×4): qty 2

## 2018-11-12 MED ORDER — CLINDAMYCIN PHOSPHATE 900 MG/50ML IV SOLN
900.0000 mg | Freq: Three times a day (TID) | INTRAVENOUS | Status: AC
  Administered 2018-11-12 (×2): 900 mg via INTRAVENOUS
  Filled 2018-11-12 (×2): qty 50

## 2018-11-12 MED ORDER — SODIUM CHLORIDE 0.9 % IV SOLN
INTRAVENOUS | Status: DC
  Administered 2018-11-12 – 2018-11-13 (×3): via INTRAVENOUS

## 2018-11-12 MED ORDER — BUPIVACAINE HCL (PF) 0.5 % IJ SOLN
INTRAMUSCULAR | Status: DC | PRN
  Administered 2018-11-12: 3 mL via INTRATHECAL

## 2018-11-12 MED ORDER — GENTAMICIN SULFATE 40 MG/ML IJ SOLN
INTRAMUSCULAR | Status: AC
  Filled 2018-11-12: qty 4

## 2018-11-12 MED ORDER — CHLORTHALIDONE 25 MG PO TABS
25.0000 mg | ORAL_TABLET | Freq: Every day | ORAL | Status: DC
  Administered 2018-11-13 – 2018-11-16 (×4): 25 mg via ORAL
  Filled 2018-11-12 (×6): qty 1

## 2018-11-12 MED ORDER — CLINDAMYCIN PHOSPHATE 900 MG/50ML IV SOLN
INTRAVENOUS | Status: AC
  Filled 2018-11-12: qty 50

## 2018-11-12 MED ORDER — PANTOPRAZOLE SODIUM 40 MG PO TBEC
40.0000 mg | DELAYED_RELEASE_TABLET | Freq: Every day | ORAL | Status: DC
  Administered 2018-11-13 – 2018-11-17 (×5): 40 mg via ORAL
  Filled 2018-11-12 (×5): qty 1

## 2018-11-12 MED ORDER — ONDANSETRON HCL 4 MG/2ML IJ SOLN: 4.0000 mg | Freq: Four times a day (QID) | INTRAMUSCULAR | Status: DC | PRN

## 2018-11-12 MED ORDER — ATENOLOL 50 MG PO TABS
50.0000 mg | ORAL_TABLET | Freq: Once | ORAL | Status: AC
  Administered 2018-11-12: 50 mg via ORAL
  Filled 2018-11-12: qty 1

## 2018-11-12 MED ORDER — HYDROMORPHONE HCL 1 MG/ML IJ SOLN: 0.5000 mg | INTRAMUSCULAR | Status: DC | PRN

## 2018-11-12 MED ORDER — BIOTIN 1000 MCG PO TABS: 1000.0000 ug | ORAL_TABLET | Freq: Every day | ORAL | Status: DC

## 2018-11-12 MED ORDER — EPINEPHRINE PF 1 MG/ML IJ SOLN
INTRAMUSCULAR | Status: DC | PRN
  Administered 2018-11-12: .0001 mL via INTRATHECAL

## 2018-11-12 MED ORDER — PRAVASTATIN SODIUM 20 MG PO TABS
40.0000 mg | ORAL_TABLET | Freq: Every day | ORAL | Status: DC
  Administered 2018-11-12 – 2018-11-16 (×5): 40 mg via ORAL
  Filled 2018-11-12 (×5): qty 2

## 2018-11-12 MED ORDER — GLYCOPYRROLATE 0.2 MG/ML IJ SOLN
INTRAMUSCULAR | Status: AC
  Filled 2018-11-12: qty 1

## 2018-11-12 MED ORDER — ACYCLOVIR 200 MG PO CAPS
400.0000 mg | ORAL_CAPSULE | Freq: Two times a day (BID) | ORAL | Status: DC
  Administered 2018-11-12 – 2018-11-17 (×10): 400 mg via ORAL
  Filled 2018-11-12 (×12): qty 2

## 2018-11-12 MED ORDER — MAGNESIUM OXIDE 400 (241.3 MG) MG PO TABS
400.0000 mg | ORAL_TABLET | Freq: Every day | ORAL | Status: DC
  Administered 2018-11-14 – 2018-11-17 (×4): 400 mg via ORAL
  Filled 2018-11-12 (×4): qty 1

## 2018-11-12 MED ORDER — PROPOFOL 500 MG/50ML IV EMUL
INTRAVENOUS | Status: AC
  Filled 2018-11-12: qty 50

## 2018-11-12 MED ORDER — BUPIVACAINE HCL (PF) 0.5 % IJ SOLN
INTRAMUSCULAR | Status: AC
  Filled 2018-11-12: qty 10

## 2018-11-12 MED ORDER — SODIUM CHLORIDE 0.9 % IV SOLN
INTRAVENOUS | Status: DC | PRN
  Administered 2018-11-12: 1000 mL

## 2018-11-12 MED ORDER — FENTANYL CITRATE (PF) 100 MCG/2ML IJ SOLN: 25.0000 ug | INTRAMUSCULAR | Status: DC | PRN

## 2018-11-12 MED ORDER — CHLORHEXIDINE GLUCONATE CLOTH 2 % EX PADS: 6.0000 | MEDICATED_PAD | Freq: Once | CUTANEOUS | Status: DC

## 2018-11-12 MED ORDER — GENTAMICIN SULFATE 40 MG/ML IJ SOLN
INTRAMUSCULAR | Status: AC
  Filled 2018-11-12: qty 2

## 2018-11-12 MED ORDER — METHOCARBAMOL 1000 MG/10ML IJ SOLN
500.0000 mg | Freq: Four times a day (QID) | INTRAVENOUS | Status: DC | PRN
  Filled 2018-11-12: qty 5

## 2018-11-12 MED ORDER — ADULT MULTIVITAMIN W/MINERALS CH
1.0000 | ORAL_TABLET | Freq: Every day | ORAL | Status: DC
  Administered 2018-11-13 – 2018-11-17 (×5): 1 via ORAL
  Filled 2018-11-12 (×5): qty 1

## 2018-11-12 MED ORDER — LIDOCAINE HCL (PF) 2 % IJ SOLN
INTRAMUSCULAR | Status: DC | PRN
  Administered 2018-11-12: 50 mg

## 2018-11-12 MED ORDER — BUPIVACAINE-EPINEPHRINE 0.25% -1:200000 IJ SOLN
INTRAMUSCULAR | Status: DC | PRN
  Administered 2018-11-12: 30 mL

## 2018-11-12 MED ORDER — VITAMIN D 25 MCG (1000 UNIT) PO TABS
2000.0000 [IU] | ORAL_TABLET | Freq: Every day | ORAL | Status: DC
  Administered 2018-11-13 – 2018-11-17 (×5): 2000 [IU] via ORAL
  Filled 2018-11-12 (×5): qty 2

## 2018-11-12 MED ORDER — FENTANYL CITRATE (PF) 100 MCG/2ML IJ SOLN
INTRAMUSCULAR | Status: DC | PRN
  Administered 2018-11-12 (×2): 25 ug via INTRAVENOUS
  Administered 2018-11-12: 50 ug via INTRAVENOUS

## 2018-11-12 MED ORDER — ALLOPURINOL 100 MG PO TABS
100.0000 mg | ORAL_TABLET | Freq: Every day | ORAL | Status: DC
  Administered 2018-11-13 – 2018-11-17 (×5): 100 mg via ORAL
  Filled 2018-11-12 (×6): qty 1

## 2018-11-12 MED ORDER — ONDANSETRON HCL 4 MG PO TABS: 4.0000 mg | ORAL_TABLET | Freq: Four times a day (QID) | ORAL | Status: DC | PRN

## 2018-11-12 MED ORDER — TRAMADOL HCL 50 MG PO TABS
50.0000 mg | ORAL_TABLET | Freq: Four times a day (QID) | ORAL | Status: DC
  Administered 2018-11-12 – 2018-11-17 (×19): 50 mg via ORAL
  Filled 2018-11-12 (×20): qty 1

## 2018-11-12 MED ORDER — BISACODYL 10 MG RE SUPP
10.0000 mg | Freq: Every day | RECTAL | Status: DC | PRN
  Administered 2018-11-15: 10 mg via RECTAL
  Filled 2018-11-12: qty 1

## 2018-11-12 MED ORDER — BUPIVACAINE-EPINEPHRINE (PF) 0.25% -1:200000 IJ SOLN
INTRAMUSCULAR | Status: AC
  Filled 2018-11-12: qty 30

## 2018-11-12 MED ORDER — MIDAZOLAM HCL 2 MG/2ML IJ SOLN
INTRAMUSCULAR | Status: AC
  Filled 2018-11-12: qty 2

## 2018-11-12 MED ORDER — FLEET ENEMA 7-19 GM/118ML RE ENEM: 1.0000 | ENEMA | Freq: Once | RECTAL | Status: DC | PRN

## 2018-11-12 MED ORDER — PROPOFOL 500 MG/50ML IV EMUL
INTRAVENOUS | Status: DC | PRN
  Administered 2018-11-12: 25 ug/kg/min via INTRAVENOUS

## 2018-11-12 MED ORDER — METFORMIN HCL 500 MG PO TABS
500.0000 mg | ORAL_TABLET | Freq: Three times a day (TID) | ORAL | Status: DC
  Administered 2018-11-12 – 2018-11-16 (×11): 500 mg via ORAL
  Filled 2018-11-12 (×13): qty 1

## 2018-11-12 MED ORDER — BACITRACIN ZINC 500 UNIT/GM EX OINT
TOPICAL_OINTMENT | CUTANEOUS | Status: AC
  Filled 2018-11-12: qty 28.35

## 2018-11-12 MED ORDER — DIPHENHYDRAMINE HCL 12.5 MG/5ML PO ELIX: 12.5000 mg | ORAL_SOLUTION | ORAL | Status: DC | PRN

## 2018-11-12 MED ORDER — ACETAMINOPHEN 325 MG PO TABS
325.0000 mg | ORAL_TABLET | Freq: Four times a day (QID) | ORAL | Status: DC | PRN
  Filled 2018-11-12: qty 1

## 2018-11-12 MED ORDER — SODIUM CHLORIDE (PF) 0.9 % IJ SOLN
INTRAMUSCULAR | Status: AC
  Filled 2018-11-12: qty 50

## 2018-11-12 MED ORDER — MORPHINE SULFATE (PF) 4 MG/ML IV SOLN
INTRAVENOUS | Status: AC
  Filled 2018-11-12: qty 1

## 2018-11-12 MED ORDER — B COMPLEX-C PO TABS
1.0000 | ORAL_TABLET | Freq: Every day | ORAL | Status: DC
  Administered 2018-11-13 – 2018-11-17 (×5): 1 via ORAL
  Filled 2018-11-12 (×6): qty 1

## 2018-11-12 MED ORDER — VITAMIN C 500 MG PO TABS
500.0000 mg | ORAL_TABLET | Freq: Every day | ORAL | Status: DC
  Administered 2018-11-13 – 2018-11-17 (×5): 500 mg via ORAL
  Filled 2018-11-12 (×5): qty 1

## 2018-11-12 MED ORDER — SODIUM CHLORIDE 0.9 % IV SOLN
INTRAVENOUS | Status: DC | PRN
  Administered 2018-11-12: 3000 mL

## 2018-11-12 MED ORDER — ALUM & MAG HYDROXIDE-SIMETH 200-200-20 MG/5ML PO SUSP: 30.0000 mL | ORAL | Status: DC | PRN

## 2018-11-12 MED ORDER — MORPHINE SULFATE 4 MG/ML IJ SOLN
INTRAMUSCULAR | Status: DC | PRN
  Administered 2018-11-12: 4 mg

## 2018-11-12 MED ORDER — METHOCARBAMOL 500 MG PO TABS: 500.0000 mg | ORAL_TABLET | Freq: Four times a day (QID) | ORAL | Status: DC | PRN

## 2018-11-12 MED ORDER — VANCOMYCIN HCL IN DEXTROSE 1-5 GM/200ML-% IV SOLN
1000.0000 mg | Freq: Two times a day (BID) | INTRAVENOUS | Status: AC
  Administered 2018-11-12: 1000 mg via INTRAVENOUS
  Filled 2018-11-12: qty 200

## 2018-11-12 MED ORDER — ACETAMINOPHEN 500 MG PO TABS
1000.0000 mg | ORAL_TABLET | Freq: Four times a day (QID) | ORAL | Status: AC
  Administered 2018-11-12 – 2018-11-13 (×4): 1000 mg via ORAL
  Filled 2018-11-12 (×4): qty 2

## 2018-11-12 MED ORDER — SODIUM CHLORIDE 0.9 % IV SOLN
INTRAVENOUS | Status: DC | PRN
  Administered 2018-11-12: 70 mL

## 2018-11-12 MED ORDER — POLYETHYLENE GLYCOL 3350 17 G PO PACK
17.0000 g | PACK | Freq: Every day | ORAL | Status: DC | PRN
  Filled 2018-11-12: qty 1

## 2018-11-12 MED ORDER — VANCOMYCIN HCL IN DEXTROSE 1-5 GM/200ML-% IV SOLN
INTRAVENOUS | Status: AC
  Filled 2018-11-12: qty 200

## 2018-11-12 MED ORDER — MIDAZOLAM HCL 5 MG/5ML IJ SOLN
INTRAMUSCULAR | Status: DC | PRN
  Administered 2018-11-12: 2 mg via INTRAVENOUS

## 2018-11-12 MED ORDER — ENOXAPARIN SODIUM 40 MG/0.4ML ~~LOC~~ SOLN
40.0000 mg | SUBCUTANEOUS | Status: DC
  Administered 2018-11-13 – 2018-11-16 (×4): 40 mg via SUBCUTANEOUS
  Filled 2018-11-12 (×4): qty 0.4

## 2018-11-12 MED ORDER — ATENOLOL-CHLORTHALIDONE 50-25 MG PO TABS: 1.0000 | ORAL_TABLET | Freq: Every day | ORAL | Status: DC

## 2018-11-12 MED ORDER — BUDESONIDE 3 MG PO CPEP: 9.0000 mg | ORAL_CAPSULE | Freq: Every day | ORAL | Status: DC | PRN

## 2018-11-12 MED ORDER — OXYCODONE HCL 5 MG/5ML PO SOLN: 5.0000 mg | Freq: Once | ORAL | Status: DC | PRN

## 2018-11-12 MED ORDER — GLYCOPYRROLATE 0.2 MG/ML IJ SOLN
INTRAMUSCULAR | Status: DC | PRN
  Administered 2018-11-12: 0.2 mg via INTRAVENOUS

## 2018-11-12 SURGICAL SUPPLY — 67 items
BLADE SAW 90X13X1.19 OSCILLAT (BLADE) ×3 IMPLANT
BLADE SAW 90X25X1.19 OSCILLAT (BLADE) ×3 IMPLANT
CANISTER SUCT 1200ML W/VALVE (MISCELLANEOUS) ×3 IMPLANT
CANISTER SUCT 3000ML PPV (MISCELLANEOUS) ×6 IMPLANT
CEMENT HV SMART SET (Cement) ×6 IMPLANT
CEMENT TIBIA MBT SIZE 5 (Knees) IMPLANT
CNTNR SPEC 2.5X3XGRAD LEK (MISCELLANEOUS) ×1
CONT SPEC 4OZ STER OR WHT (MISCELLANEOUS) ×2
CONTAINER SPEC 2.5X3XGRAD LEK (MISCELLANEOUS) ×1 IMPLANT
COOLER POLAR GLACIER W/PUMP (MISCELLANEOUS) ×3 IMPLANT
COVER WAND RF STERILE (DRAPES) ×3 IMPLANT
CUFF TOURN 24 STER (MISCELLANEOUS) IMPLANT
CUFF TOURN 30 STER DUAL PORT (MISCELLANEOUS) ×2 IMPLANT
DRAPE IMP U-DRAPE 54X76 (DRAPES) ×3 IMPLANT
DRAPE INCISE IOBAN 66X60 STRL (DRAPES) ×3 IMPLANT
DRAPE SHEET LG 3/4 BI-LAMINATE (DRAPES) ×6 IMPLANT
DRAPE SURG 17X11 SM STRL (DRAPES) ×6 IMPLANT
DRSG OPSITE POSTOP 4X12 (GAUZE/BANDAGES/DRESSINGS) ×3 IMPLANT
DRSG OPSITE POSTOP 4X14 (GAUZE/BANDAGES/DRESSINGS) ×1 IMPLANT
DURAPREP 26ML APPLICATOR (WOUND CARE) ×9 IMPLANT
ELECT CAUTERY BLADE 6.4 (BLADE) ×2 IMPLANT
ELECT REM PT RETURN 9FT ADLT (ELECTROSURGICAL) ×3
ELECTRODE REM PT RTRN 9FT ADLT (ELECTROSURGICAL) ×1 IMPLANT
FEMUR SIGMA PS SZ 5.0 R (Femur) ×2 IMPLANT
GAUZE SPONGE 4X4 12PLY STRL (GAUZE/BANDAGES/DRESSINGS) ×3 IMPLANT
GLOVE BIOGEL PI IND STRL 9 (GLOVE) ×1 IMPLANT
GLOVE BIOGEL PI INDICATOR 9 (GLOVE) ×2
GLOVE SURG 9.0 ORTHO LTXF (GLOVE) ×6 IMPLANT
GOWN STRL REUS TWL 2XL XL LVL4 (GOWN DISPOSABLE) ×3 IMPLANT
GOWN STRL REUS W/ TWL LRG LVL3 (GOWN DISPOSABLE) ×1 IMPLANT
GOWN STRL REUS W/ TWL LRG LVL4 (GOWN DISPOSABLE) ×1 IMPLANT
GOWN STRL REUS W/TWL LRG LVL3 (GOWN DISPOSABLE) ×2
GOWN STRL REUS W/TWL LRG LVL4 (GOWN DISPOSABLE) ×2
HOLDER FOLEY CATH W/STRAP (MISCELLANEOUS) ×3 IMPLANT
IMMBOLIZER KNEE 19 BLUE UNIV (SOFTGOODS) ×3 IMPLANT
KIT TURNOVER KIT A (KITS) ×3 IMPLANT
NDL SAFETY ECLIPSE 18X1.5 (NEEDLE) ×1 IMPLANT
NDL SPNL 20GX3.5 QUINCKE YW (NEEDLE) ×1 IMPLANT
NEEDLE HYPO 18GX1.5 SHARP (NEEDLE) ×2
NEEDLE HYPO 22GX1.5 SAFETY (NEEDLE) ×3 IMPLANT
NEEDLE SPNL 20GX3.5 QUINCKE YW (NEEDLE) ×3 IMPLANT
NS IRRIG 1000ML POUR BTL (IV SOLUTION) ×3 IMPLANT
PACK TOTAL KNEE (MISCELLANEOUS) ×3 IMPLANT
PAD PREP 24X41 OB/GYN DISP (PERSONAL CARE ITEMS) ×3 IMPLANT
PAD WRAPON POLAR KNEE (MISCELLANEOUS) ×1 IMPLANT
PATELLA DOME PFC 38MM (Knees) ×2 IMPLANT
PENCIL SMOKE ULTRAEVAC 22 CON (MISCELLANEOUS) ×3 IMPLANT
PLATE ROT INSERT 12.5MM (Plate) ×2 IMPLANT
PULSAVAC PLUS IRRIG FAN TIP (DISPOSABLE) ×3
SOL .9 NS 3000ML IRR  AL (IV SOLUTION) ×2
SOL .9 NS 3000ML IRR UROMATIC (IV SOLUTION) ×1 IMPLANT
SPONGE DRAIN TRACH 4X4 STRL 2S (GAUZE/BANDAGES/DRESSINGS) ×1 IMPLANT
SPONGE LAP 18X18 RF (DISPOSABLE) ×2 IMPLANT
STAPLER SKIN PROX 35W (STAPLE) ×3 IMPLANT
SUCTION FRAZIER HANDLE 10FR (MISCELLANEOUS) ×2
SUCTION TUBE FRAZIER 10FR DISP (MISCELLANEOUS) ×1 IMPLANT
SUT ETHIBOND NAB CT1 #1 30IN (SUTURE) ×6 IMPLANT
SUT VIC AB 0 CT1 36 (SUTURE) ×3 IMPLANT
SUT VIC AB 2-0 CT1 (SUTURE) ×6 IMPLANT
SYR 20CC LL (SYRINGE) ×3 IMPLANT
SYR 30ML LL (SYRINGE) ×6 IMPLANT
TIBIA MBT CEMENT SIZE 5 (Knees) ×3 IMPLANT
TIP FAN IRRIG PULSAVAC PLUS (DISPOSABLE) ×1 IMPLANT
TOWER CARTRIDGE SMART MIX (DISPOSABLE) ×3 IMPLANT
TRAY FOLEY MTR SLVR 16FR STAT (SET/KITS/TRAYS/PACK) ×3 IMPLANT
TUBE SUCT KAM VAC (TUBING) ×3 IMPLANT
WRAPON POLAR PAD KNEE (MISCELLANEOUS) ×3

## 2018-11-12 NOTE — Anesthesia Procedure Notes (Signed)
Spinal  Patient location during procedure: OR Staffing Anesthesiologist: Piscitello, Joseph K, MD Resident/CRNA: Vita Currin, CRNA Performed: resident/CRNA  Preanesthetic Checklist Completed: patient identified, site marked, surgical consent, pre-op evaluation, timeout performed, IV checked, risks and benefits discussed and monitors and equipment checked Spinal Block Patient position: sitting Prep: ChloraPrep and site prepped and draped Patient monitoring: heart rate, continuous pulse ox, blood pressure and cardiac monitor Approach: midline Location: L4-5 Injection technique: single-shot Needle Needle type: Introducer and Pencan  Needle gauge: 24 G Needle length: 9 cm Additional Notes Negative paresthesia. Negative blood return. Positive free-flowing CSF. Expiration date of kit checked and confirmed. Patient tolerated procedure well, without complications.       

## 2018-11-12 NOTE — Anesthesia Post-op Follow-up Note (Signed)
Anesthesia QCDR form completed.        

## 2018-11-12 NOTE — Evaluation (Signed)
Physical Therapy Evaluation Patient Details Name: Thomas Carlson MRN: [id number] DOB: 27-Nov-1945 Today's Date: 11/12/2018   History of Present Illness  Patient is a 73 year old male admitted s/p R TKA.  PMH includes seizures, neuropathy, Htn, HLD, heart murmur, gout, DM, DJD, CKD 3 and CAD.  Clinical Impression  Pt is a 73 year old male who lives in a two story home with his wife.  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  Pt in bed with knee immobilizer donned on R LE and reporting 4/10 pain.  Pt able to perform SLR and quad sets and physician in room to approve of removal for ambulation.  He demonstrated good strength of L LE and R LE only limited by post op pain.  Pt able to perform bed mobility and STS transfer with min A.  PT provided VC's for WB status and HEP management and pt was receptive, demonstrating ability to perform all exercises.  Pt able to walk 4-5 steps to recliner and sequence use of RW with VC's.  Pt will continue to benefit from continued skilled PT with focus on pain management, stair negotiation, strength, knee ROM and safe functional mobility.    Follow Up Recommendations Home health PT    Equipment Recommendations  Rolling walker with 5" wheels;3in1 (PT)    Recommendations for Other Services       Precautions / Restrictions Precautions Precautions: Knee Precaution Booklet Issued: No Restrictions Weight Bearing Restrictions: Yes RLE Weight Bearing: Weight bearing as tolerated      Mobility  Bed Mobility Overal bed mobility: Needs Assistance Bed Mobility: Supine to Sit     Supine to sit: Min assist     General bed mobility comments: Min hand held assist to slide to EOB  Transfers Overall transfer level: Needs assistance Equipment used: Rolling walker (2 wheeled) Transfers: Sit to/from Stand Sit to Stand: Min assist         General transfer comment: VC's for placement of hands on RW and WB status  Ambulation/Gait Ambulation/Gait  assistance: Min guard Gait Distance (Feet): 4 Feet Assistive device: Rolling walker (2 wheeled)       General Gait Details: Able to ambulate to RW with step to gait and VC's for management of RW.  Pt able to control descent to RW  with UE's.  Stairs            Wheelchair Mobility    Modified Rankin (Stroke Patients Only)       Balance Overall balance assessment: Modified Independent                                           Pertinent Vitals/Pain Pain Assessment: 0-10 Pain Score: 4  Pain Location: R knee Pain Intervention(s): Monitored during session    Home Living Family/patient expects to be discharged to:: Private residence Living Arrangements: Spouse/significant other Available Help at Discharge: Family;Friend(s) Type of Home: House Home Access: Stairs to enter Entrance Stairs-Rails: None Entrance Stairs-Number of Steps: 5 Home Layout: Two level Home Equipment: None      Prior Function Level of Independence: Independent         Comments: Pt generally active community ambulator     Hand Dominance        Extremity/Trunk Assessment   Upper Extremity Assessment Upper Extremity Assessment: Overall WFL for tasks assessed    Lower Extremity  Assessment Lower Extremity Assessment: Overall WFL for tasks assessed;RLE deficits/detail(L LE: 4+/5 grossly) RLE: Unable to fully assess due to pain RLE Sensation: WNL    Cervical / Trunk Assessment Cervical / Trunk Assessment: Normal  Communication   Communication: No difficulties  Cognition Arousal/Alertness: Awake/alert Behavior During Therapy: WFL for tasks assessed/performed Overall Cognitive Status: Within Functional Limits for tasks assessed                                        General Comments      Exercises Total Joint Exercises Ankle Circles/Pumps: 20 reps;Strengthening;Both;Seated Quad Sets: Right;10 reps;Seated;Strengthening Goniometric ROM: R knee  ext/flex: 3-85 Other Exercises Other Exercises: Reviewed HEP ther ex and schedule x4 min Other Exercises: polar care education x3 min   Assessment/Plan    PT Assessment Patient needs continued PT services  PT Problem List Decreased strength;Decreased mobility;Decreased range of motion;Decreased activity tolerance;Decreased balance;Decreased knowledge of use of DME;Pain       PT Treatment Interventions DME instruction;Functional mobility training;Balance training;Patient/family education;Gait training;Therapeutic activities;Stair training;Therapeutic exercise    PT Goals (Current goals can be found in the Care Plan section)  Acute Rehab PT Goals Patient Stated Goal: To return to general daily activity without an AD PT Goal Formulation: With patient Time For Goal Achievement: 11/26/18 Potential to Achieve Goals: Good    Frequency BID   Barriers to discharge        Co-evaluation               AM-PAC PT "6 Clicks" Mobility  Outcome Measure Help needed turning from your back to your side while in a flat bed without using bedrails?: None Help needed moving from lying on your back to sitting on the side of a flat bed without using bedrails?: A Little Help needed moving to and from a bed to a chair (including a wheelchair)?: A Little Help needed standing up from a chair using your arms (e.g., wheelchair or bedside chair)?: A Little Help needed to walk in hospital room?: A Little Help needed climbing 3-5 steps with a railing? : A Little 6 Click Score: 19    End of Session Equipment Utilized During Treatment: Gait belt(R knee immobilizer donned at beginning of session.  Reomoved, per Dr. Mack Guise, pt only needs to wear this when in supine for prolonged period of time.) Activity Tolerance: Patient tolerated treatment well Patient left: in chair;with call bell/phone within reach;with chair alarm set Nurse Communication: Mobility status PT Visit Diagnosis: Unsteadiness on feet  (R26.81);Muscle weakness (generalized) (M62.81);Pain Pain - Right/Left: Right Pain - part of body: Knee    Time: 6606-3016 PT Time Calculation (min) (ACUTE ONLY): 31 min   Charges:   PT Evaluation $PT Eval Low Complexity: 1 Low PT Treatments $Therapeutic Activity: 8-22 mins        Roxanne Gates, PT, DPT   Roxanne Gates 11/12/2018, 4:49 PM

## 2018-11-12 NOTE — Progress Notes (Signed)
   11/12/18 1500  Clinical Encounter Type  Visited With Patient and family together  Visit Type Initial  Referral From Physician  Consult/Referral To Chaplain  Recommendations  (Please return tomorrow.)    Chaplain received an OR to update or complete an AD. Upon arrival to the room, the patient was lying down in bed with family at the bedside. Patient on the phone and requests that we come back tomorrow instead.

## 2018-11-12 NOTE — H&P (Signed)
PREOPERATIVE H&P  Chief Complaint: OSTEOARTHRITIS OF RIGHT KNEE JOINT  HPI: Thomas Carlson is a 73 y.o. male who presents for preoperative history and physical with a diagnosis of OSTEOARTHRITIS OF RIGHT KNEE JOINT. Symptoms are rated as moderate to severe, and have been worsening.  This is significantly impairing activities of daily living.  Patient has failed nonoperative management.  He has elected for surgical management.   Past Medical History:  Diagnosis Date  . Acute posthemorrhagic anemia   . Aortic valve sclerosis 08/13/2016   Sclerosis without stenosis by [place] (08/2016)  . CAD (coronary artery disease) 2005   s/p stent ([place])  . Cholelithiasis    by CT  . Chronic kidney disease, stage 3, mod decreased GFR ([facility])   . Diabetes mellitus ([place]) 2010  . Diverticulosis    by CT  . DJD (degenerative joint disease)    knee  . Genital herpes   . Gout   . Heart murmur    followed by PCP  . HLD (hyperlipidemia)   . HTN (hypertension)   . Knee pain, right    "needs replacement"  . Neuropathy    feet  . Obesity   . Seizures ([place])    x1 - after craniotomy (1960s)  . Weakness of both legs    "since back surgery"   Past Surgical History:  Procedure Laterality Date  . APPENDECTOMY  1958  . CLOSED REDUCTION CLAVICLE FRACTURE  1955  . COLONOSCOPY  02/2008   int hemorrhoids, diverticula (Dr. Allen Norris)  . COLONOSCOPY N/A 08/26/2018   TA, rpt 3 yrs ([place], [place])  . COLONOSCOPY WITH PROPOFOL N/A 11/10/2015   diverticulosis, path with microscopic colitis [name], MD)  . CORONARY ANGIOPLASTY WITH STENT PLACEMENT  2005   stent 2005  . DECOMPRESSIVE LUMBAR LAMINECTOMY LEVEL 1  03/2018   herniated L4/5 disc R with free fargment over L4 s/p surgery L4 ([place])  . ESOPHAGOGASTRODUODENOSCOPY N/A 08/26/2018   reactive gastritis with benign biopsies ([place], Rohini Reddy)  . HERNIA REPAIR  1956   inguinal  . KNEE ARTHROSCOPY  2008   torn meniscus  . SUBDURAL HEMATOMA  EVACUATION VIA CRANIOTOMY  1964   hit in helmet by baseball   Social History   Socioeconomic History  . Marital status: Married    Spouse name: Not on file  . Number of children: Not on file  . Years of education: Not on file  . Highest education level: Not on file  Occupational History  . Not on file  Social Needs  . Financial resource strain: Not hard at all  . Food insecurity:    Worry: Never true    Inability: Never true  . Transportation needs:    Medical: No    Non-medical: No  Tobacco Use  . Smoking status: Never Smoker  . Smokeless tobacco: Never Used  Substance and Sexual Activity  . Alcohol use: No    Alcohol/week: 0.0 standard drinks  . Drug use: No  . Sexual activity: Not Currently  Lifestyle  . Physical activity:    Days per week: 0 days    Minutes per session: Not on file  . Stress: To some extent  Relationships  . Social connections:    Talks on phone: Twice a week    Gets together: Twice a week    Attends religious service: 1 to 4 times per year    Active member of club or organization: No    Attends meetings of clubs or  organizations: Not on file    Relationship status: Married  Other Topics Concern  . Not on file  Social History Narrative   Caffeine: 2 cans of diet soda/day   Lives with wife.  1 dog at home.  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 Diabetes Father 58  . Coronary artery disease Sister        catheterizations  . COPD Brother   . Stroke Brother   . Hypertension Brother   . Cancer Neg Hx    Allergies  Allergen Reactions  . Gabapentin Other (See Comments)    unknown  . Bactrim [Sulfamethoxazole-Trimethoprim] Rash    Diffuse drug reaction - maculopapular rash  . Penicillins Hives    Has patient had a PCN reaction  causing immediate rash, facial/tongue/throat swelling, SOB or lightheadedness with hypotension: Unknown Has patient had a PCN reaction causing severe rash involving mucus membranes or skin necrosis: Unknown Has patient had a PCN reaction that required hospitalization: Unknown Has patient had a PCN reaction occurring within the last 10 years: Unknown If all of the above answers are "NO", then may proceed with Cephalosporin use.   . Lasix [Furosemide] Other (See Comments)    Drops blood pressure and drained potassium and magnesium   Prior to Admission medications   Medication Sig Start Date End Date Taking? Authorizing Provider  acyclovir (ZOVIRAX) 400 MG tablet Take 1 tablet (400 mg total) by mouth 2 (two) times daily. 10/10/17  Yes Ria Bush, MD  allopurinol (ZYLOPRIM) 100 MG tablet Take 1 tablet (100 mg total) by mouth daily. 10/10/17  Yes Ria Bush, MD  aspirin EC 81 MG tablet Take 81 mg by mouth daily.   Yes [provider]  atenolol-chlorthalidone (TENORETIC) 50-25 MG tablet Take 1 tablet by mouth daily. 10/10/17  Yes Ria Bush, MD  b complex vitamins tablet Take 1 tablet by mouth daily.   Yes [provider]  Biotin 1000 MCG tablet Take 1,000 mcg by mouth daily.    Yes [provider]  budesonide (ENTOCORT EC) 3 MG 24 hr capsule Take 3 capsules at breakfast daily Patient taking differently: Take 9 mg by mouth daily as needed (diarrhea). Take 3 capsules at breakfast daily as needed 07/10/18  Yes [name], MD  Cholecalciferol (VITAMIN D) 2000 units CAPS Take 2,000 Units by mouth daily.    Yes [provider]  lovastatin (MEVACOR) 40 MG tablet Take 2 tablets (80 mg total) by mouth at bedtime. 10/10/17  Yes Ria Bush, MD  Magnesium 500 MG TABS Take 500 mg by mouth daily.   Yes [provider]  meloxicam (MOBIC) 7.5 MG tablet Take 7.5 mg by mouth daily.  09/15/18  Yes [provider]  metFORMIN  (GLUCOPHAGE) 500 MG tablet Take 1 tablet (500 mg total) by mouth 3 (three) times daily before meals. 07/16/18  Yes Ria Bush, MD  Multiple Vitamin (MULTIVITAMIN) tablet Take 1 tablet by mouth daily.   Yes [provider]  pantoprazole (PROTONIX) 40 MG tablet Take 1 tablet (40 mg total) by mouth daily. 09/02/18  Yes Ria Bush, MD  Potassium Chloride ER 20 MEQ TBCR Take 20 mEq by mouth 2 (two) times daily. 10/15/18  Yes Ria Bush, MD  quinapril (ACCUPRIL) 20 MG tablet Take 1 tablet (  20 mg total) by mouth at bedtime. 10/10/17  Yes Ria Bush, MD  vitamin C (ASCORBIC ACID) 500 MG tablet Take 500 mg by mouth daily.   Yes [provider]     Positive ROS: All other systems have been reviewed and were otherwise negative with the exception of those mentioned in the HPI and as above.  Physical Exam: General: Alert, no acute distress Cardiovascular: Regular rate and rhythm, no murmurs rubs or gallops.  No pedal edema Respiratory: Clear to auscultation bilaterally, no wheezes rales or rhonchi. No cyanosis, no use of accessory musculature GI: No organomegaly, abdomen is soft and non-tender nondistended with positive bowel sounds. Skin: Skin intact, no lesions within the operative field. Neurologic: Sensation intact distally Psychiatric: Patient is competent for consent with normal mood and affect Lymphatic: No cervical lymphadenopathy  MUSCULOSKELETAL: Right knee: Patient's skin is intact.  There is no erythema ecchymosis or effusion.  He has palpable pedal pulses, intact sensation light touch and intact motor function.  Range of motion is from -5 to 115 degrees.  There is no ligamentous laxity.  Assessment: OSTEOARTHRITIS OF RIGHT KNEE JOINT  Plan: Plan for Procedure(s): RIGHT TOTAL KNEE ARTHROPLASTY  I have reviewed the details of the operation as well as the postoperative course with the patient and his family at the bedside this morning.  I  discussed the risks and benefits of surgery. The risks include but are not limited to infection, bleeding requiring blood transfusion, nerve or blood vessel injury, joint stiffness or loss of motion, persistent pain, weakness or instability, and hardware failure and the need for further surgery. Medical risks include but are not limited to DVT and pulmonary embolism, myocardial infarction, stroke, pneumonia, respiratory failure and death. Patient understood these risks and wished to proceed.     Thornton Park, MD   11/12/2018 7:52 AM

## 2018-11-12 NOTE — Care Management Note (Signed)
Case Management Note  Patient Details  Name: Thomas Carlson MRN: [id number] Date of Birth: 1946/05/08  Subjective/Objective:                  Met with Patient to discuss DC plan Patient will need a rolling walker Patient was provided with the [facility] list per CMS.gov Notified Brad with [place] that the patient needs a RW Patient uses [facility] as a pharmacy, can afford medication Patient uses Dr. Danise Mina PCP Patient has transportation  Patient will determine what other needs that he has before DC    Action/Plan: [facility] list provided to the patient per CMS.gov Expected Discharge Date:                  Expected Discharge Plan:  [place]  In-House Referral:     Discharge planning Services     Post Acute Care Choice:  Durable Medical Equipment Choice offered to:     DME Arranged:  Walker rolling DME Agency:  [place]:  PT [facility] Agency:     Status of Service:  In process, will continue to follow  If discussed at Long Length of Stay Meetings, dates discussed:    Additional Comments:  Su Hilt, RN 11/12/2018, 3:19 PM

## 2018-11-12 NOTE — Progress Notes (Signed)
  Subjective:  POST OP CHECK:  Patient reports right pain as moderate.  He is on the side of the bed with physical therapists.  His wife is in the room.  Objective:   VITALS:   Vitals:   11/12/18 1237 11/12/18 1311 11/12/18 1424 11/12/18 1529  BP: 137/74 (!) 158/74 129/67 131/64  Pulse: 63 64 71 70  Resp:  (!) 22 20 18   Temp: 97.6 F (36.4 C) (!) 97.4 F (36.3 C) (!) 97.5 F (36.4 C) 98.2 F (36.8 C)  TempSrc: Oral Oral Oral Oral  SpO2: 99% 98% 98% 97%  Weight:      Height:        PHYSICAL EXAM: Right lower extremity: Neurovascular intact Sensation intact distally Intact pulses distally Dorsiflexion/Plantar flexion intact Incision: dressing C/D/I No cellulitis present Compartment soft  LABS  Results for orders placed or performed during the hospital encounter of 11/12/18 (from the past 24 hour(s))  Glucose, capillary     Status: Abnormal   Collection Time: 11/12/18  6:20 AM  Result Value Ref Range   Glucose-Capillary 183 (H) 70 - 99 mg/dL  Surgical pcr screen     Status: None   Collection Time: 11/12/18  6:21 AM  Result Value Ref Range   MRSA, PCR NEGATIVE NEGATIVE   Staphylococcus aureus NEGATIVE NEGATIVE  Glucose, capillary     Status: Abnormal   Collection Time: 11/12/18 11:04 AM  Result Value Ref Range   Glucose-Capillary 176 (H) 70 - 99 mg/dL    Dg Knee Right Port  Result Date: 11/12/2018 CLINICAL DATA:  Status post right knee replacement today. EXAM: PORTABLE RIGHT KNEE - 1-2 VIEW COMPARISON:  Plain films right knee 02/13/2018. FINDINGS: New total arthroplasty is in place. The device is located. No fracture. Gas in the soft tissues and surgical staples noted. IMPRESSION: Status post right knee replacement.  No acute finding. Electronically Signed   By: Inge Rise M.D.   On: 11/12/2018 11:43    Assessment/Plan: Day of Surgery   Active Problems:   Total knee replacement status, right  Patient is stable postop.  His spinal block is wearing off  and he is having increasing pain in the right knee.  He recently received oxycodone.  I reviewed the postoperative x-ray which shows his total knee components are well-positioned.  There is no fracture or dislocation or other postop complication seen.  Patient will receive 24 hours of postop antibiotics vancomycin and clindamycin.  Foley catheter will be removed in the morning.  Labs will be checked in the morning.  Patient will begin Lovenox for DVT prophylaxis tomorrow.    Thornton Park , MD 11/12/2018, 4:11 PM

## 2018-11-12 NOTE — Progress Notes (Signed)
PHARMACIST - PHYSICIAN ORDER COMMUNICATION  CONCERNING: P&T Medication Policy on Herbal Medications  DESCRIPTION:  This patient's order for:  Biotin  has been noted.  This product(s) is classified as an "herbal" or natural product. Due to a lack of definitive safety studies or FDA approval, nonstandard manufacturing practices, plus the potential risk of unknown drug-drug interactions while on inpatient medications, the Pharmacy and Therapeutics Committee does not permit the use of "herbal" or natural products of this type within [place].   ACTION TAKEN: The pharmacy department is unable to verify this order at this time Please reevaluate patient's clinical condition at discharge and address if the herbal or natural product(s) should be resumed at that time.    

## 2018-11-12 NOTE — NC FL2 (Signed)
[place] LEVEL OF CARE SCREENING TOOL     IDENTIFICATION  Patient Name: Thomas Carlson Birthdate: Nov 06, 1945 Sex: male Admission Date (Current Location): 11/12/2018  [place] and [place] Number:  Engineering geologist and Address:  [facility], [address], [place], [place] [address]      Provider Number: [id number]  Attending Physician Name and Address:  Thornton Park, MD  Relative Name and Phone Number:       Current Level of Care: Hospital Recommended Level of Care: [place] Prior Approval Number:    Date Approved/Denied:   PASRR Number: [id number])  Discharge Plan: SNF    Current Diagnoses: Patient Active Problem List   Diagnosis Date Noted  . Total knee replacement status, right 11/12/2018  . Peripheral neuropathy 09/02/2018  . Degeneration of lumbar intervertebral disc 08/17/2018  . Obstructive sleep apnea syndrome 08/17/2018  . Edema of both feet 07/28/2018  . Weakness of both lower extremities 07/16/2018  . Lymphocytic colitis 08/13/2016  . Right knee pain 08/13/2016  . Aortic valve sclerosis 08/13/2016  . Hearing loss of left ear due to cerumen impaction 08/13/2016  . Vitamin B12 deficiency 08/09/2016  . Diverticulosis of large intestine without diverticulitis   . Medicare annual wellness visit, subsequent 07/13/2015  . Advanced care planning/counseling discussion 07/13/2015  . Lumbar disc herniation with radiculopathy 05/26/2014  . Vitamin D deficiency 07/14/2013  . Healthcare maintenance 07/10/2012  . CKD stage 3 due to type 2 diabetes mellitus ([place])   . DJD (degenerative joint disease)   . Diabetes mellitus type 2, controlled, with complications ([place])   . HTN (hypertension)   . HLD (hyperlipidemia)   . CAD (coronary artery disease)   . Gout   . Severe obesity (BMI 35.0-39.9) with comorbidity ([place])     Orientation RESPIRATION BLADDER Height & Weight     Self, Time, Situation,  Place  Normal Continent Weight: 252 lb 8 oz (114.5 kg) Height:  5' 7.5" (171.5 cm)  BEHAVIORAL SYMPTOMS/MOOD NEUROLOGICAL BOWEL NUTRITION STATUS      Continent Diet(Diet: Regular )  AMBULATORY STATUS COMMUNICATION OF NEEDS Skin   Extensive Assist Verbally Surgical wounds(Incision: Right Knee. )                       Personal Care Assistance Level of Assistance  Bathing, Feeding, Dressing Bathing Assistance: Limited assistance Feeding assistance: Independent Dressing Assistance: Limited assistance     Functional Limitations Info  Sight, Hearing, Speech Sight Info: Adequate Hearing Info: Adequate Speech Info: Adequate    SPECIAL CARE FACTORS FREQUENCY  PT (By licensed PT), OT (By licensed OT)     PT Frequency: (5) OT Frequency: (5)            Contractures      Additional Factors Info  Code Status, Allergies Code Status Info: (Full Code. ) Allergies Info: (Gabapentin, Bactrim Sulfamethoxazole-trimethoprim, Penicillins, Lasix Furosemide)           Current Medications (11/12/2018):  This is the current hospital active medication list Current Facility-Administered Medications  Medication Dose Route Frequency Provider Last Rate Last Dose  . 0.9 %  sodium chloride infusion   Intravenous Continuous Thornton Park, MD 75 mL/hr at 11/12/18 1311    . acetaminophen (TYLENOL) tablet 1,000 mg  1,000 mg Oral Q6H Thornton Park, MD   1,000 mg at 11/12/18 1312  . [START ON 11/13/2018] acetaminophen (TYLENOL) tablet 325-650 mg  325-650 mg Oral Q6H PRN Thornton Park,  MD      . acyclovir (ZOVIRAX) 200 MG capsule 400 mg  400 mg Oral BID Thornton Park, MD      . allopurinol (ZYLOPRIM) tablet 100 mg  100 mg Oral Daily Thornton Park, MD      . alum & mag hydroxide-simeth (MAALOX/MYLANTA) 200-200-20 MG/5ML suspension 30 mL  30 mL Oral Q4H PRN Thornton Park, MD      . aspirin EC tablet 81 mg  81 mg Oral Daily Thornton Park, MD      . atenolol (TENORMIN) tablet 50 mg   50 mg Oral Daily Thornton Park, MD       And  . chlorthalidone (HYGROTON) tablet 25 mg  25 mg Oral Daily Thornton Park, MD      . B-complex with vitamin C tablet 1 tablet  1 tablet Oral Daily Thornton Park, MD      . bisacodyl (DULCOLAX) suppository 10 mg  10 mg Rectal Daily PRN Thornton Park, MD      . budesonide (ENTOCORT EC) 24 hr capsule 9 mg  9 mg Oral Daily PRN Thornton Park, MD      . cholecalciferol (VITAMIN D3) tablet 2,000 Units  2,000 Units Oral Daily Thornton Park, MD      . clindamycin (CLEOCIN) IVPB 900 mg  900 mg Intravenous Q8H Thornton Park, MD      . diphenhydrAMINE (BENADRYL) 12.5 MG/5ML elixir 12.5-25 mg  12.5-25 mg Oral Q4H PRN Thornton Park, MD      . docusate sodium (COLACE) capsule 100 mg  100 mg Oral BID Thornton Park, MD      . Derrill Memo ON 11/13/2018] enoxaparin (LOVENOX) injection 40 mg  40 mg Subcutaneous Q24H Thornton Park, MD      . HYDROmorphone (DILAUDID) injection 0.5-1 mg  0.5-1 mg Intravenous Q4H PRN Thornton Park, MD      . magnesium oxide (MAG-OX) tablet 400 mg  400 mg Oral Daily Thornton Park, MD      . metFORMIN (GLUCOPHAGE) tablet 500 mg  500 mg Oral TID AC Thornton Park, MD      . methocarbamol (ROBAXIN) tablet 500 mg  500 mg Oral Q6H PRN Thornton Park, MD       Or  . methocarbamol (ROBAXIN) 500 mg in dextrose 5 % 50 mL IVPB  500 mg Intravenous Q6H PRN Thornton Park, MD      . multivitamin with minerals tablet 1 tablet  1 tablet Oral Daily Thornton Park, MD      . ondansetron [facility]) tablet 4 mg  4 mg Oral Q6H PRN Thornton Park, MD       Or  . ondansetron [facility]) injection 4 mg  4 mg Intravenous Q6H PRN Thornton Park, MD      . oxyCODONE (Oxy IR/ROXICODONE) immediate release tablet 10-15 mg  10-15 mg Oral Q4H PRN Thornton Park, MD      . oxyCODONE (Oxy IR/ROXICODONE) immediate release tablet 5-10 mg  5-10 mg Oral Q4H PRN Thornton Park, MD   10 mg at 11/12/18 1529  . pantoprazole (PROTONIX) EC  tablet 40 mg  40 mg Oral Daily Thornton Park, MD      . polyethylene glycol (MIRALAX / GLYCOLAX) packet 17 g  17 g Oral Daily PRN Thornton Park, MD      . potassium chloride SA (K-DUR,KLOR-CON) CR tablet 20 mEq  20 mEq Oral BID Thornton Park, MD      . pravastatin (PRAVACHOL) tablet 40 mg  40 mg Oral q1800 Thornton Park, MD      .  quinapril (ACCUPRIL) tablet 20 mg  20 mg Oral QHS Thornton Park, MD      . sodium phosphate (FLEET) 7-19 GM/118ML enema 1 enema  1 enema Rectal Once PRN Thornton Park, MD      . traMADol Veat[facility] Bourbon) tablet 50 mg  50 mg Oral Q6H Thornton Park, MD   50 mg at 11/12/18 1312  . vancomycin (VANCOCIN) IVPB 1000 mg/200 mL premix  1,000 mg Intravenous Q12H Thornton Park, MD      . vitamin C (ASCORBIC ACID) tablet 500 mg  500 mg Oral Daily Thornton Park, MD         Discharge Medications: Please see discharge summary for a list of discharge medications.  Relevant Imaging Results:  Relevant Lab Results:   Additional Information (SSN: [id number])  Vivan Agostino, Veronia Beets, LCSW

## 2018-11-12 NOTE — Op Note (Signed)
DATE OF SURGERY:  11/12/2018 TIME: 11:08 AM  PATIENT NAME:  Thomas Carlson   AGE: 73 y.o.    PRE-OPERATIVE DIAGNOSIS:  OSTEOARTHRITIS OF RIGHT KNEE JOINT  POST-OPERATIVE DIAGNOSIS:  Same  PROCEDURE:  Procedure(s): RIGHT TOTAL KNEE ARTHROPLASTY  SURGEON:  Thornton Park, MD   ASSISTANT:  Carlynn Spry, PA, Ave Filter, [place] PA student  OPERATIVE IMPLANTS: Depuy PFC Sigma, Posterior Stabilized Femural component size 5, Tibia size rotating platform component size 5, Patella polyethylene 3-peg oval button size 38, with a 12.5 mm polyethylene insert.  EBL:  50  TOURNIQUET TIME:  125 minutes  PREOPERATIVE INDICATIONS:  Thomas Carlson is an 73 y.o. male who has a diagnosis of  OSTEOARTHRITIS OF RIGHT KNEE JOINT and elected for a right total knee arthroplasty after failing nonoperative treatment.  Their knee pain significantly impacts their activity of daily living.  Radiographs have demonstrated tricompartmental osteoarthritis joint space narrowing, osteophytes, subchondral sclerosis and subchondral cyst formation.  The risks, benefits, and alternatives were discussed at length including but not limited to the risks of infection, bleeding, nerve or blood vessel injury, knee stiffness, fracture, dislocation, loosening or failure of the hardware and the need for further surgery. Medical risks include but not limited to DVT and pulmonary embolism, myocardial infarction, stroke, pneumonia, respiratory failure and death. I discussed these risks with the patient in my office prior to the date of surgery. They understood these risks and were willing to proceed.  OPERATIVE FINDINGS AND UNIQUE ASPECTS OF THE CASE:  tricompartmental osteoarthritis  OPERATIVE DESCRIPTION:  The patient was brought to the operative room and placed in a supine position after undergoing placement of a spinal anesthetic.  A Foley catheter was placed.  IV antibiotics were given. Patient received vancomycin and  clindamycin due to a PCN allergy. The lower extremity was prepped and draped in the usual sterile fashion.  A time out was performed to verify the patient's name, date of birth, medical record number, correct site of surgery and correct procedure to be performed. The timeout was also used to confirm the patient received antibiotics and that appropriate instruments, implants and radiographs studies were available in the room.  The leg was elevated and exsanguinated with an Esmarch and the tourniquet was inflated to 275 mmHg for 125 minutes.  A midline incision was made over the right knee. Full-thickness skin flaps were developed. A medial parapatellar arthrotomy was then made and the patella everted and the knee was brought into 90 of flexion. Hoffa's fat pad along with the cruciate ligaments and medial and lateral menisci were resected.   The distal femoral intramedullary canal was opened with a drill and the intramedullary distal femoral cutting jig was inserted into the femoral canal pinned into position. It was set at 5 degrees resecting 10 mm off the distal femur.  Care was taken to protect the collateral ligaments during distal femoral resection.  The distal femoral resection was performed with an oscillating saw. The femoral cutting guide was then removed.  The extramedullary tibial cutting guide was then placed using the anterior tibial crest and second ray of the foot as a references.  The tibial cutting guide was adjusted to allow for appropriate posterior slope.  The tibial cutting block was pinned into position. The slotted stylus was used to measure the proximal tibial resection of 10 mm off the high lateral side.  The tibial long rod alignment guide was then used to confirm position of the cutting block. A third cross  pin through the tibial cutting block was then drilled into position to allow for rotational stability. Care was taken during the tibial resection to protect the medial and  collateral ligaments.  The resected tibial bone was removed along with the posterior horns of the menisci.  The PCL was sacrificed.  Extension gap was measured with a spacer block and alignment and extension was confirmed using a long alignment rod.  The attention was then turned back to the femur. The posterior referencing distal femoral sizing guide was applied to the distal femur.  The femur was sized to be a size 5. Rotation of the referencing guide was checked with the epicondylar axis and Whitesides line. Then the 4-in-1 cutting jig was then applied to the distal femur. A stylus was used to confirm that the anterior femur would not be notched.   Then the anterior, posterior and chamfer femoral cuts were then made with an oscillating saw.  The flexion gap was then measured with a flexion spacer block and long alignment rod and was found to be symmetric with the extension gap and perpendicular to mechanical axis of the tibia.  The distal femoral preparation was completed by performing the posterior stabilized box cut using the cutting block. The entry site for the intramedullary femoral guide was filled with autologous bone graft from bone previously resected earlier in the case.  The proximal tibia plateau was then sized with trial trays. The best coverage was achieved with a size 5. This tibial tray was then pinned into position. The proximal tibia was then prepared with the reamer and keel punch.  After tibial preparation was completed, all trial components were inserted with polyethylene trials.  The knee was found to have excellent balance and full motion with a size 10 mm tibial polyethylene insert.  The attention was then turned to preparation of the patella. The thickness of the patella was measured with a caliper, the diameter measured with the patella templates.  The patella resection was then made with an oscillating saw using the patella cutting guide.   3 peg holes for the patella component  were then drilled. The trial patella was then placed. Knee was taken through a full range of motion and deemed to be stable with the trial components. All trial components were then removed. The knee capsule was then injected with Exparel.  The knee joint capsule was injected with a mixture of quarter percent Marcaine, Toradol and morphine to assist with postoperative pain relief.  The joint was copiously irrigated with pulse lavage.  The final total knee arthroplasty components were then cemented into place with a 10 mm trial polyethylene insert and all excess methylmethacrylate was removed.  The joint was again copiously irrigated. After the cement had hardened the knee was again taken through a full range of motion. It was felt to be most stable with the 12.5 mm tibial polyethylene insert. The actual tibial polyethylene insert was then placed.   The knee was taken through a range of motion and the patella tracked well and the knee was again irrigated copiously.    The medial arthrotomy was closed with #1 Ethibond. The subcutaneous tissue closed with 0 and 2-0 vicryl, and skin approximated with staples.  A dry sterile and compressive dressing was applied.  A Polar Care was applied to the operative knee along with a knee immobilizer.  The patient was awakened and brought to the PACU in stable and satisfactory condition.  All sharp, lap and instrument counts  were correct at the conclusion the case. I spoke with the patient's family in the postop consultation room to let them know the case had been performed without complication and the patient was stable in recovery room.

## 2018-11-12 NOTE — Transfer of Care (Signed)
Immediate Anesthesia Transfer of Care Note  Patient: Thomas Carlson  Procedure(s) Performed: TOTAL KNEE ARTHROPLASTY (Right Knee)  Patient Location: PACU  Anesthesia Type:Spinal  Level of Consciousness: awake and alert   Airway & Oxygen Therapy: Patient Spontanous Breathing  Post-op Assessment: Report given to RN and Post -op Vital signs reviewed and stable  Post vital signs: Reviewed  Last Vitals:  Vitals Value Taken Time  BP 136/73 11/12/2018 11:02 AM  Temp    Pulse 72 11/12/2018 11:02 AM  Resp 13 11/12/2018 11:02 AM  SpO2 98 % 11/12/2018 11:02 AM  Vitals shown include unvalidated device data.  Last Pain:  Vitals:   11/12/18 0622  TempSrc: Temporal         Complications: No apparent anesthesia complications
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
## 2018-11-13 ENCOUNTER — Encounter
Admission: RE | Admit: 2018-11-13 | Discharge: 2018-11-13 | Disposition: A | Discharge status: Home / Self Care | Payer: Medicare HMO | Source: Ambulatory Visit | Attending: Internal Medicine | Admitting: Internal Medicine

## 2018-11-13 ENCOUNTER — Encounter: Payer: Self-pay | Admitting: Orthopedic Surgery

## 2018-11-13 LAB — CBC
HEMATOCRIT: 30.3 % — AB (ref 39.0–52.0)
Hemoglobin: 9.9 g/dL — ABNORMAL LOW (ref 13.0–17.0)
MCH: 32.5 pg (ref 26.0–34.0)
MCHC: 32.7 g/dL (ref 30.0–36.0)
MCV: 99.3 fL (ref 80.0–100.0)
Platelets: 146 10*3/uL — ABNORMAL LOW (ref 150–400)
RBC: 3.05 MIL/uL — ABNORMAL LOW (ref 4.22–5.81)
RDW: 13.5 % (ref 11.5–15.5)
WBC: 11.2 10*3/uL — ABNORMAL HIGH (ref 4.0–10.5)
nRBC: 0 % (ref 0.0–0.2)

## 2018-11-13 LAB — BASIC METABOLIC PANEL
Anion gap: 5 (ref 5–15)
BUN: 37 mg/dL — ABNORMAL HIGH (ref 8–23)
CO2: 24 mmol/L (ref 22–32)
Calcium: 7.4 mg/dL — ABNORMAL LOW (ref 8.9–10.3)
Chloride: 107 mmol/L (ref 98–111)
Creatinine, Ser: 1.36 mg/dL — ABNORMAL HIGH (ref 0.61–1.24)
GFR calc Af Amer: 59 mL/min — ABNORMAL LOW (ref >60)
GFR, EST NON AFRICAN AMERICAN: 51 mL/min — AB (ref >60)
GLUCOSE: 166 mg/dL — AB (ref 70–99)
Potassium: 3.8 mmol/L (ref 3.5–5.1)
Sodium: 136 mmol/L (ref 135–145)

## 2018-11-13 NOTE — Progress Notes (Signed)
  Subjective:  POD #1 s/p right TKA.  Patient reports right knee pain as mild at rest.  Patient has not yet received physical therapy today.  His family is at the bedside.  Objective:   VITALS:   Vitals:   11/12/18 2316 11/13/18 0420 11/13/18 0908 11/13/18 1228  BP: 125/73 (!) 110/53 (!) 99/59 (!) 118/59  Pulse: 62 64 65 64  Resp:  19 18   Temp: (!) 97.5 F (36.4 C) (!) 97.3 F (36.3 C) (!) 97.5 F (36.4 C)   TempSrc: Oral Oral Oral   SpO2: 97% 98% 97% 96%  Weight:      Height:        PHYSICAL EXAM: Right lower extremity: Patient's right knee is in an immobilizer.  He is lying in bed. Neurovascular intact Sensation intact distally Intact pulses distally Dorsiflexion/Plantar flexion intact No cellulitis present Compartment soft  LABS  Results for orders placed or performed during the hospital encounter of 11/12/18 (from the past 24 hour(s))  CBC     Status: Abnormal   Collection Time: 11/13/18  4:31 AM  Result Value Ref Range   WBC 11.2 (H) 4.0 - 10.5 K/uL   RBC 3.05 (L) 4.22 - 5.81 MIL/uL   Hemoglobin 9.9 (L) 13.0 - 17.0 g/dL   HCT 30.3 (L) 39.0 - 52.0 %   MCV 99.3 80.0 - 100.0 fL   MCH 32.5 26.0 - 34.0 pg   MCHC 32.7 30.0 - 36.0 g/dL   RDW 13.5 11.5 - 15.5 %   Platelets 146 (L) 150 - 400 K/uL   nRBC 0.0 0.0 - 0.2 %  Basic metabolic panel     Status: Abnormal   Collection Time: 11/13/18  4:31 AM  Result Value Ref Range   Sodium 136 135 - 145 mmol/L   Potassium 3.8 3.5 - 5.1 mmol/L   Chloride 107 98 - 111 mmol/L   CO2 24 22 - 32 mmol/L   Glucose, Bld 166 (H) 70 - 99 mg/dL   BUN 37 (H) 8 - 23 mg/dL   Creatinine, Ser 1.36 (H) 0.61 - 1.24 mg/dL   Calcium 7.4 (L) 8.9 - 10.3 mg/dL   GFR calc non Af Amer 51 (L) >60 mL/min   GFR calc Af Amer 59 (L) >60 mL/min   Anion gap 5 5 - 15    Dg Knee Right Port  Result Date: 11/12/2018 CLINICAL DATA:  Status post right knee replacement today. EXAM: PORTABLE RIGHT KNEE - 1-2 VIEW COMPARISON:  Plain films right knee  02/13/2018. FINDINGS: New total arthroplasty is in place. The device is located. No fracture. Gas in the soft tissues and surgical staples noted. IMPRESSION: Status post right knee replacement.  No acute finding. Electronically Signed   By: Inge Rise M.D.   On: 11/12/2018 11:43    Assessment/Plan: 1 Day Post-Op   Active Problems:   Total knee replacement status, right  Patient is doing well post-op.  Patient's hemoglobin and hematocrit are stable.  He is afebrile with stable vital signs.  Patient's Foley catheter is been removed.  He will receive physical therapy later today.  Depending on his physical therapy evaluation will determine his disposition upon discharge.  I will continue to monitor the patient's progress.    Thornton Park , MD 11/13/2018, 1:27 PM

## 2018-11-13 NOTE — Progress Notes (Signed)
Physical Therapy Treatment Patient Details Name: Thomas Carlson MRN: [id number] DOB: Jun 21, 1946 Today's Date: 11/13/2018    History of Present Illness Patient is a 73 year old male admitted to [facility] for a TKA.  PMHx includes seizures, neuropathy, Htn, HLD, heart murmur, gout, DM, DJD, CKD 3 and CAD.    PT Comments    Pt in bed upon entry RN in room, preparing for mobility to BR for BM. Pt assisted to BR, modA for bed mobility, minA for slow, high-effort AMB. Poor tolerance for weightbearing. Pt assisted with cleanup after toiletting. AMB from bathroom to recliner over ~2 minutes slow labored AMB with constant tachypnea throughout, pt attributes to being 'not in great shape'. Pt assisted with exercises in chair at end of session. AMB remains a struggle, heavy physical assist is required for bed mobility and transfers, physical assist required for clean up after toiletting, hence PT recommendations updated to STR so patient can quickly regain potential for independent mobility. Will see again later this afternoon.    Follow Up Recommendations  SNF     Equipment Recommendations  Rolling walker with 5" wheels;3in1 (PT)    Recommendations for Other Services       Precautions / Restrictions Precautions Precautions: Knee Precaution Booklet Issued: No Required Braces or Orthoses: Knee Immobilizer - Right Restrictions Weight Bearing Restrictions: Yes RLE Weight Bearing: Weight bearing as tolerated    Mobility  Bed Mobility Overal bed mobility: Needs Assistance Bed Mobility: Supine to Sit     Supine to sit: Mod assist     General bed mobility comments: needs physical assist of RLE to floor, and hand held assist to allow pt to pull self forward.   Transfers Overall transfer level: Needs assistance Equipment used: Rolling walker (2 wheeled) Transfers: Sit to/from Stand Sit to Stand: Mod assist;From elevated surface         General transfer comment: Requires lift assist, 2x in  session.   Ambulation/Gait Ambulation/Gait assistance: Min guard Gait Distance (Feet): 16 Feet Assistive device: Rolling walker (2 wheeled)       General Gait Details: pain limiting with difficulty with weight acceptance of operative knee; heavy BUE weight bearing. several short momemnts of instability and pain. Takes about 2 minutes to mobilize from bathroom to recliner. (tachypnea throughout d/t high effort level. )   Stairs             Wheelchair Mobility    Modified Rankin (Stroke Patients Only)       Balance Overall balance assessment: Mild deficits observed, not formally tested;Modified Independent                                          Cognition Arousal/Alertness: Awake/alert Behavior During Therapy: WFL for tasks assessed/performed;Flat affect Overall Cognitive Status: Within Functional Limits for tasks assessed                                        Exercises Total Joint Exercises Short Arc Quad: AAROM;Right;Seated;10 reps Heel Slides: AAROM;Right;Seated;15 reps Goniometric ROM: Rt knee P/ROM: 18-83 degrees     General Comments        Pertinent Vitals/Pain Pain Assessment: 0-10 Pain Score: 6 (after AMB< improved with rest) Pain Location: Right Knee Pain Intervention(s): Limited activity within patient's tolerance;Monitored during session;Premedicated before  session;Repositioned;Ice applied    Home Living Family/patient expects to be discharged to:: Private residence Living Arrangements: Spouse/significant other Available Help at Discharge: Family;Friend(s) Type of Home: House Home Access: Stairs to enter Entrance Stairs-Rails: None Home Layout: Two level Home Equipment: None;Hand held shower head      Prior Function Level of Independence: Independent      Comments: Pt generally active community ambulator   PT Goals (current goals can now be found in the care plan section) Acute Rehab PT Goals Patient  Stated Goal: To return home PT Goal Formulation: With patient Time For Goal Achievement: 11/26/18 Potential to Achieve Goals: Poor Additional Goals Additional Goal #1: Pt will demonstrate 0-90 degrees of R knee extension/flexion to facilitate safe functional mobility. Progress towards PT goals: Progressing toward goals    Frequency    BID      PT Plan Discharge plan needs to be updated    Co-evaluation              AM-PAC PT "6 Clicks" Mobility   Outcome Measure  Help needed turning from your back to your side while in a flat bed without using bedrails?: A Little Help needed moving from lying on your back to sitting on the side of a flat bed without using bedrails?: A Lot Help needed moving to and from a bed to a chair (including a wheelchair)?: A Little Help needed standing up from a chair using your arms (e.g., wheelchair or bedside chair)?: A Little Help needed to walk in hospital room?: A Little Help needed climbing 3-5 steps with a railing? : A Lot 6 Click Score: 16    End of Session Equipment Utilized During Treatment: Gait belt Activity Tolerance: Patient tolerated treatment well Patient left: in chair;with call bell/phone within reach;with chair alarm set;with SCD's reapplied Nurse Communication: Mobility status PT Visit Diagnosis: Unsteadiness on feet (R26.81);Muscle weakness (generalized) (M62.81);Pain Pain - Right/Left: Right Pain - part of body: Knee     Time: 8768-1157 PT Time Calculation (min) (ACUTE ONLY): 38 min  Charges:  $Therapeutic Exercise: 8-22 mins $Therapeutic Activity: 23-37 mins                     2:29 PM, 11/13/18 Etta Grandchild, PT, DPT Physical Therapist - [facility]  [phone] ([place])      Mattilyn Crites C 11/13/2018, 2:26 PM

## 2018-11-13 NOTE — Care Management (Signed)
Notified Bethena Roys with [facility] that the patient did not do well with PT and SNF is recommended

## 2018-11-13 NOTE — Evaluation (Signed)
Occupational Therapy Evaluation Patient Details Name: Thomas Carlson MRN: [id number] DOB: 08/07/1946 Today's Date: 11/13/2018    History of Present Illness Patient is a 73 year old male admitted to [facility] for a TKA.  PMHx includes seizures, neuropathy, Htn, HLD, heart murmur, gout, DM, DJD, CKD 3 and CAD.   Clinical Impression   Pt. presents with weakness, limited activity tolerance, and limited functional mobility which limits the ability to complete basic LE ADL and IADL functioning. Pt. resides at home with his wife. Pt. was independent with ADLs, and IADL functioning: including meal preparation, and medication management. Pt. Was able to drive, and is retired. Pt. edcuation was provided about A/E use for LE ADLs. Pt. Reports being familiar with OT services, and  having all the A/E at home, and has been using it consistently following back surgery in June 2019. Pt. declined need for additional A/E, and ADL training with OT services. Pt. Has no additional questions, A/E, or OT related concerns at this time. Pt. Plans to return home with family assist as needed for ADLs, IADLs, and meals.    Follow Up Recommendations  No OT follow up    Equipment Recommendations       Recommendations for Other Services       Precautions / Restrictions Precautions Precautions: Knee Precaution Booklet Issued: No Restrictions Weight Bearing Restrictions: Yes RLE Weight Bearing: Weight bearing as tolerated      Mobility Bed Mobility                  Transfers Overall transfer level: Needs assistance   Transfers: Sit to/from Stand Sit to Stand: Min assist         General transfer comment: Per PT report    Balance                                           ADL either performed or assessed with clinical judgement   ADL Overall ADL's : Needs assistance/impaired Eating/Feeding: Independent;Set up   Grooming: Independent;Set up   Upper Body Bathing: Set  up;Independent   Lower Body Bathing: Set up;Moderate assistance   Upper Body Dressing : Set up;Independent   Lower Body Dressing: Set up;Moderate assistance                 General ADL Comments: Pt. edcuation was provided about A/E use for LE ADLs.     Vision Baseline Vision/History: Wears glasses Wears Glasses: At all times       Perception     Praxis      Pertinent Vitals/Pain Pain Assessment: 0-10 Pain Score: 4  Pain Location: Right Knee     Hand Dominance Left   Extremity/Trunk Assessment Upper Extremity Assessment Upper Extremity Assessment: Overall WFL for tasks assessed           Communication Communication Communication: No difficulties   Cognition Arousal/Alertness: Awake/alert Behavior During Therapy: WFL for tasks assessed/performed Overall Cognitive Status: Within Functional Limits for tasks assessed                                     General Comments       Exercises     Shoulder Instructions      Home Living Family/patient expects to be discharged to:: Private residence Living Arrangements: Spouse/significant other  Available Help at Discharge: Family;Friend(s) Type of Home: House Home Access: Stairs to enter [employer] of Steps: 5 Entrance Stairs-Rails: None Home Layout: Two level Alternate Level Stairs-Number of Steps: 17 Alternate Level Stairs-Rails: Can reach both Bathroom Shower/Tub: Tub/shower unit;Walk-in shower;Door   Armed forces training and education officer: Yes   Home Equipment: None;Hand held shower head          Prior Functioning/Environment Level of Independence: Independent        Comments: Pt generally active community ambulator        OT Problem List: Decreased range of motion;Decreased knowledge of use of DME or AE      OT Treatment/Interventions:      OT Goals(Current goals can be found in the care plan section) Acute Rehab OT Goals Patient Stated Goal: To  return home OT Goal Formulation: With patient Potential to Achieve Goals: Good  OT Frequency:     Barriers to D/C:            Co-evaluation              AM-PAC OT "6 Clicks" Daily Activity     Outcome Measure Help from another person eating meals?: None Help from another person taking care of personal grooming?: None Help from another person toileting, which includes using toliet, bedpan, or urinal?: A Little Help from another person bathing (including washing, rinsing, drying)?: A Lot Help from another person to put on and taking off regular upper body clothing?: None Help from another person to put on and taking off regular lower body clothing?: A Lot 6 Click Score: 19   End of Session    Activity Tolerance: Patient tolerated treatment well Patient left: in bed;with call bell/phone within reach;with bed alarm set  OT Visit Diagnosis: Muscle weakness (generalized) (M62.81)                Time: 5852-7782 OT Time Calculation (min): 15 min Charges:  OT General Charges $OT Visit: 1 Visit OT Evaluation $OT Eval Low Complexity: 1 Low  Harrel Carina, MS, OTR/L   Harrel Carina 11/13/2018, 11:11 AM

## 2018-11-13 NOTE — Clinical Social Work Placement (Signed)
   CLINICAL SOCIAL WORK PLACEMENT  NOTE  Date:  11/13/2018  Patient Details  Name: Thomas Carlson MRN: [id number] Date of Birth: 15-Jul-1946  Clinical Social Work is seeking post-discharge placement for this patient at the [place] level of care (*CSW will initial, date and re-position this form in  chart as items are completed):  Yes   Patient/family provided with [facility] Work Department's list of facilities offering this level of care within the geographic area requested by the patient (or if unable, by the patient's family).  Yes   Patient/family informed of their freedom to choose among providers that offer the needed level of care, that participate in Medicare, Medicaid or managed care program needed by the patient, have an available bed and are willing to accept the patient.  Yes   Patient/family informed of [facility]'s ownership interest in [facility] and [facility], as well as of the fact that they are under no obligation to receive care at these facilities.  PASRR submitted to EDS on 11/12/18     PASRR number received on 11/12/18     Existing PASRR number confirmed on       FL2 transmitted to all facilities in geographic area requested by pt/family on 11/13/18     FL2 transmitted to all facilities within larger geographic area on       Patient informed that his/her managed care company has contracts with or will negotiate with certain facilities, including the following:            Patient/family informed of bed offers received.  Patient chooses bed at       Physician recommends and patient chooses bed at      Patient to be transferred to   on  .  Patient to be transferred to facility by       Patient family notified on   of transfer.  Name of family member notified:        PHYSICIAN       Additional Comment:    _______________________________________________ Kymberlie Brazeau, Veronia Beets, LCSW 11/13/2018, 2:33 PM

## 2018-11-13 NOTE — Care Management (Signed)
Talked with patient to follow up and see if they have made a choice for [facility] agency yet, he stated they have narrowed it down to 2 but does not want to make a choice until later, after PT gets him up today. Follow up with Patient again

## 2018-11-13 NOTE — Anesthesia Postprocedure Evaluation (Signed)
Anesthesia Post Note  Patient: Thomas Carlson  Procedure(s) Performed: TOTAL KNEE ARTHROPLASTY (Right Knee)  Patient location during evaluation: Nursing Unit Anesthesia Type: Spinal Level of consciousness: awake and alert and oriented Pain management: pain level controlled Vital Signs Assessment: post-procedure vital signs reviewed and stable Respiratory status: spontaneous breathing and nonlabored ventilation Cardiovascular status: stable Postop Assessment: patient able to bend at knees, adequate PO intake, able to ambulate and no apparent nausea or vomiting Anesthetic complications: no     Last Vitals:  Vitals:   11/12/18 2316 11/13/18 0420  BP: 125/73 (!) 110/53  Pulse: 62 64  Resp:  19  Temp: (!) 36.4 C (!) 36.3 C  SpO2: 97% 98%    Last Pain:  Vitals:   11/13/18 0500  TempSrc:   PainSc: 4                  Lanora Manis

## 2018-11-13 NOTE — Progress Notes (Signed)
Physical Therapy Treatment Patient Details Name: Thomas Carlson MRN: [id number] DOB: 11/17/1945 Today's Date: 11/13/2018    History of Present Illness Patient is a 73 year old male admitted to [facility] for a TKA.  PMHx includes seizures, neuropathy, Htn, HLD, heart murmur, gout, DM, DJD, CKD 3 and CAD.    PT Comments    Pt continues to have significant pain, quad weakness, difficulty with mobility, ambulation, WBing etc.  He was able to manage ~15 ft with walker (same as this AM) with great effort and c/o increased pain.  He was able to tolerate ROM and strength exercises but remains very guarded and limited.     Follow Up Recommendations  SNF     Equipment Recommendations  Rolling walker with 5" wheels;3in1 (PT)    Recommendations for Other Services       Precautions / Restrictions Precautions Precautions: Knee Required Braces or Orthoses: Knee Immobilizer - Right Knee Immobilizer - Right: Other (comment)(per Dr. Raliegh Ip, for prolonged time in supine to promote TKE. ) Restrictions Weight Bearing Restrictions: Yes RLE Weight Bearing: Weight bearing as tolerated    Mobility  Bed Mobility Overal bed mobility: Needs Assistance Bed Mobility: Sit to Supine     Supine to sit: Mod assist Sit to supine: Mod assist   General bed mobility comments: Pt needed assist to get LEs up into bed, R more than L  Transfers Overall transfer level: Needs assistance Equipment used: Rolling walker (2 wheeled) Transfers: Sit to/from Stand Sit to Stand: Mod assist         General transfer comment: Pt showed good effort but ultimately needed considerable assist to get weight forward and up  Ambulation/Gait Ambulation/Gait assistance: Min assist Gait Distance (Feet): 15 Feet Assistive device: Rolling walker (2 wheeled)       General Gait Details: Pt with very pain limited, guarded and heavily reliant on walker ambulation.  KI donned with a few small episodes of buckling but no LOBs.     Stairs             Wheelchair Mobility    Modified Rankin (Stroke Patients Only)       Balance Overall balance assessment: Mild deficits observed, not formally tested                                          Cognition Arousal/Alertness: Awake/alert Behavior During Therapy: WFL for tasks assessed/performed;Flat affect Overall Cognitive Status: Within Functional Limits for tasks assessed                                        Exercises Total Joint Exercises Ankle Circles/Pumps: Strengthening;10 reps Quad Sets: Strengthening;10 reps Short Arc Quad: AROM;10 reps Heel Slides: AAROM;10 reps Hip ABduction/ADduction: Strengthening;10 reps Straight Leg Raises: PROM;5 reps Knee Flexion: PROM;5 reps Goniometric ROM: 5-78    General Comments        Pertinent Vitals/Pain Pain Assessment: 0-10 Pain Score: 7 (increases with ROM tasks) Pain Location: Right Knee Pain Intervention(s): Limited activity within patient's tolerance;Monitored during session;Premedicated before session;Repositioned;Ice applied    Home Living                      Prior Function            PT Goals (current  goals can now be found in the care plan section) Acute Rehab PT Goals Patient Stated Goal: To return home PT Goal Formulation: With patient Time For Goal Achievement: 11/26/18 Potential to Achieve Goals: Poor Additional Goals Additional Goal #1: Pt will demonstrate 0-90 degrees of R knee extension/flexion to facilitate safe functional mobility. Progress towards PT goals: Progressing toward goals    Frequency    BID      PT Plan Current plan remains appropriate    Co-evaluation              AM-PAC PT "6 Clicks" Mobility   Outcome Measure  Help needed turning from your back to your side while in a flat bed without using bedrails?: A Little Help needed moving from lying on your back to sitting on the side of a flat bed without  using bedrails?: A Lot Help needed moving to and from a bed to a chair (including a wheelchair)?: A Little Help needed standing up from a chair using your arms (e.g., wheelchair or bedside chair)?: A Little Help needed to walk in hospital room?: A Little Help needed climbing 3-5 steps with a railing? : Total 6 Click Score: 15    End of Session Equipment Utilized During Treatment: Gait belt Activity Tolerance: Patient tolerated treatment well Patient left: with call bell/phone within reach;with bed alarm set Nurse Communication: Mobility status PT Visit Diagnosis: Unsteadiness on feet (R26.81);Muscle weakness (generalized) (M62.81);Pain Pain - Right/Left: Right Pain - part of body: Knee     Time: 1650-1730 PT Time Calculation (min) (ACUTE ONLY): 40 min  Charges:  $Gait Training: 8-22 mins $Therapeutic Exercise: 8-22 mins $Therapeutic Activity: 8-22 mins                     Kreg Shropshire, DPT 11/13/2018, 5:47 PM

## 2018-11-13 NOTE — Progress Notes (Signed)
Clinical Education officer, museum (CSW) presented bed offers to patient and he chose [facility]. Per [facility] admissions coordinator at [facility] she will start [facility] authorization today and will have a private room for patient.   [employer], LCSW [phone]

## 2018-11-13 NOTE — Clinical Social Work Note (Signed)
Clinical Social Work Assessment  Patient Details  Name: Thomas Carlson MRN: [id number] Date of Birth: [birth year]  Date of referral:  11/13/18               Reason for consult:  Facility Placement                Permission sought to share information with:  Chartered certified accountant granted to share information::  Yes, Verbal Permission Granted  Name::      [place]::   [place]   Relationship::     Contact Information:     Housing/Transportation Living arrangements for the past 2 months:  [place] of Information:  Patient Patient Interpreter Needed:  None Criminal Activity/Legal Involvement Pertinent to Current Situation/Hospitalization:  No - Comment as needed Significant Relationships:  Spouse, Adult Children Lives with:  Spouse Do you feel safe going back to the place where you live?  Yes Need for family participation in patient care:  Yes (Comment)  Care giving concerns:  Patient lives in [place] with his wife Thomas Carlson.    Social Worker assessment / plan:  Holiday representative (CSW) received SNF consult. PT is recommending SNF. CSW met with patient alone at bedside to discuss D/C plan. Patient was alert and oriented X4 and was sitting up in the chair at bedside. CSW introduced self and explained role of CSW department. Per patient he lives in [place] with his wife Thomas Carlson. CSW explained SNF process and that [facility] will have to approve it. Patient is agreeable to SNF search in [facility]. FL2 complete and faxed out. CSW will continue to follow and assist as needed.   Employment status:  Disabled (Comment on whether or not currently receiving Disability), Retired Nurse, adult PT Recommendations:  [place] / Referral to community resources:  [place]  Patient/Family's Response to care:  Patient is agreeable to [facility] in [place].    Patient/Family's Understanding of and Emotional Response to Diagnosis, Current Treatment, and Prognosis:  Patient was very pleasant and thanked CSW for assistance.   Emotional Assessment Appearance:  Appears stated age Attitude/Demeanor/Rapport:    Affect (typically observed):  Accepting, Adaptable, Pleasant Orientation:  Oriented to Self, Oriented to Place, Oriented to  Time, Oriented to Situation Alcohol / Substance use:  Not Applicable Psych involvement (Current and /or in the community):  No (Comment)  Discharge Needs  Concerns to be addressed:  Discharge Planning Concerns Readmission within the last 30 days:  No Current discharge risk:  Dependent with Mobility Barriers to Discharge:  Continued Medical Work up   [employer], Veronia Beets, LCSW 11/13/2018, 2:34 PM

## 2018-11-14 LAB — CBC
HCT: 29.6 % — ABNORMAL LOW (ref 39.0–52.0)
Hemoglobin: 9.7 g/dL — ABNORMAL LOW (ref 13.0–17.0)
MCH: 32.8 pg (ref 26.0–34.0)
MCHC: 32.8 g/dL (ref 30.0–36.0)
MCV: 100 fL (ref 80.0–100.0)
Platelets: 139 10*3/uL — ABNORMAL LOW (ref 150–400)
RBC: 2.96 MIL/uL — AB (ref 4.22–5.81)
RDW: 13.6 % (ref 11.5–15.5)
WBC: 11.7 10*3/uL — ABNORMAL HIGH (ref 4.0–10.5)
nRBC: 0 % (ref 0.0–0.2)

## 2018-11-14 NOTE — Progress Notes (Signed)
Subjective: 2 Days Post-Op Procedure(s) (LRB): TOTAL KNEE ARTHROPLASTY (Right)   Alert and awake.  Is finishing a PT session.  Slow progress according to patient and therapist.  Moderate pain.  Awaiting authorization from [facility] for skilled nursing placement.  This will likely not occur until Monday.  Patient reports pain as moderate.  Objective:   VITALS:   Vitals:   11/14/18 0513 11/14/18 0845  BP: (!) 142/61 125/64  Pulse: 71 71  Resp:  18  Temp:    SpO2:  97%    Neurologically intact ABD soft Neurovascular intact Sensation intact distally Intact pulses distally Dorsiflexion/Plantar flexion intact Incision: dressing C/D/I  LABS Recent Labs    11/13/18 0431 11/14/18 0354  HGB 9.9* 9.7*  HCT 30.3* 29.6*  WBC 11.2* 11.7*  PLT 146* 139*    Recent Labs    11/13/18 0431  NA 136  K 3.8  BUN 37*  CREATININE 1.36*  GLUCOSE 166*    No results for input(s): LABPT, INR in the last 72 hours.   Assessment/Plan: 2 Days Post-Op Procedure(s) (LRB): TOTAL KNEE ARTHROPLASTY (Right)   Advance diet Up with therapy D/C IV fluids Discharge to SNF on Monday.

## 2018-11-14 NOTE — Progress Notes (Signed)
Physical Therapy Treatment Patient Details Name: Thomas Carlson MRN: [id number] DOB: 09-24-1946 Today's Date: 11/14/2018    History of Present Illness Patient is a 73 year old male admitted to [facility] for a TKA.  PMHx includes seizures, neuropathy, Htn, HLD, heart murmur, gout, DM, DJD, CKD 3 and CAD.    PT Comments    Pt in bed upon entry, reports pain is worse today, more constant throb, whereas he was relatively comfortable when not moving limb yesterday. Full bed HEP program, requires physical assist to perform d/u pain inhibition and weakness, isolated quads function for knee extension is poor. MinA for OOB. MinGuard for transfers from EOB elevated quite a bit. AMB tolerated more poorly this date than prior, but additional cues given for alignment, pt reporting fearful of buckling. KI may make him more functional for AMB, will attempt in PM session.   Follow Up Recommendations  SNF     Equipment Recommendations  Rolling walker with 5" wheels;3in1 (PT)    Recommendations for Other Services       Precautions / Restrictions Precautions Precautions: Knee Required Braces or Orthoses: Knee Immobilizer - Right Knee Immobilizer - Right: Other (comment)(with prlonged supine) Restrictions RLE Weight Bearing: Weight bearing as tolerated    Mobility  Bed Mobility Overal bed mobility: Needs Assistance Bed Mobility: Sit to Supine     Supine to sit: Min assist(physical assist needed for Right leg 2/2 pain)        Transfers Overall transfer level: Needs assistance Equipment used: Rolling walker (2 wheeled) Transfers: Sit to/from Stand Sit to Stand: Min guard;From elevated surface         General transfer comment: able to perform 3x from elevated EOB; heavy BUE effort needed, voices pain limitations  Ambulation/Gait Ambulation/Gait assistance: Min guard Gait Distance (Feet): 9 Feet Assistive device: Rolling walker (2 wheeled)       General Gait Details: Pt with very pain  limited, guarded and heavily reliant on walker ambulation.  KI doffed does not improve AMB tolerance, and noted buckling without LOB.    Stairs             Wheelchair Mobility    Modified Rankin (Stroke Patients Only)       Balance Overall balance assessment: Mild deficits observed, not formally tested                                          Cognition Arousal/Alertness: Awake/alert Behavior During Therapy: WFL for tasks assessed/performed Overall Cognitive Status: Within Functional Limits for tasks assessed                                        Exercises Total Joint Exercises Ankle Circles/Pumps: Strengthening;20 reps;Both Quad Sets: 10 reps;Right;AAROM;Supine Gluteal Sets: AAROM;Supine;Right;10 reps Short Arc Quad: 10 reps;AAROM;Right;Supine Heel Slides: AAROM;10 reps;Supine;Right Hip ABduction/ADduction: AAROM;10 reps;Right;Supine Goniometric ROM: Right knee P/ROM: 15-82    General Comments        Pertinent Vitals/Pain Pain Score: 6  Pain Location: Right Knee Pain Descriptors / Indicators: (constant throbbing) Pain Intervention(s): Limited activity within patient's tolerance;Monitored during session;Premedicated before session;Repositioned;Ice applied    Home Living                      Prior Function  PT Goals (current goals can now be found in the care plan section) Acute Rehab PT Goals Patient Stated Goal: To return home PT Goal Formulation: With patient Time For Goal Achievement: 11/26/18 Potential to Achieve Goals: Poor Additional Goals Additional Goal #1: Pt will demonstrate 0-90 degrees of R knee extension/flexion to facilitate safe functional mobility. Progress towards PT goals: Not progressing toward goals - comment    Frequency    BID      PT Plan Current plan remains appropriate    Co-evaluation              AM-PAC PT "6 Clicks" Mobility   Outcome Measure  Help  needed turning from your back to your side while in a flat bed without using bedrails?: A Little Help needed moving from lying on your back to sitting on the side of a flat bed without using bedrails?: A Lot Help needed moving to and from a bed to a chair (including a wheelchair)?: A Little Help needed standing up from a chair using your arms (e.g., wheelchair or bedside chair)?: A Little Help needed to walk in hospital room?: A Little Help needed climbing 3-5 steps with a railing? : Total 6 Click Score: 15    End of Session Equipment Utilized During Treatment: Gait belt;Right knee immobilizer Activity Tolerance: Patient limited by pain;Patient limited by fatigue Patient left: with call bell/phone within reach;in chair;with chair alarm set Nurse Communication: Mobility status PT Visit Diagnosis: Unsteadiness on feet (R26.81);Muscle weakness (generalized) (M62.81);Pain Pain - Right/Left: Right Pain - part of body: Knee     Time: 3244-0102 PT Time Calculation (min) (ACUTE ONLY): 38 min  Charges:  $Therapeutic Exercise: 23-37 mins $Therapeutic Activity: 8-22 mins                     11:55 AM, 11/14/18 Etta Grandchild, PT, DPT Physical Therapist - [facility]  [phone] ([place])     Devynn Scheff C 11/14/2018, 11:50 AM

## 2018-11-14 NOTE — Progress Notes (Signed)
Physical Therapy Treatment Patient Details Name: Thomas Carlson MRN: [id number] DOB: [birth year] Today's Date: 11/14/2018    History of Present Illness Patient is a 73 year old male admitted to [facility] for a TKA.  PMHx includes seizures, neuropathy, Htn, HLD, heart murmur, gout, DM, DJD, CKD 3 and CAD.    PT Comments    Pt received up in chair at entry, agreeable to treatment session. Increased physical assist for transfers this session compared to morning. Several minutes spent attempting AMB without avail, pt appears to have have heavy BUE reliance on RW, often too far anterior and forward flexed, and BUE fatigue is apparent and limiting. Pt unable to progress RLE without dragging the limb and even with this approach is unable to move limb more than 12 inches this session. Pt reports feeling perplexed as to why his body is not responding and why his RLE is so weak. Heavv physical assistance for back into bed. Several attempts made for teach mobility for postioning with pt collapsing posteriorly onto bed. At EOB, heel elevated 12 inches why KI is donned to maintain TKE. Pt made comfortable.   Follow Up Recommendations  SNF     Equipment Recommendations  Rolling walker with 5" wheels;3in1 (PT)    Recommendations for Other Services       Precautions / Restrictions Precautions Precautions: Knee Precaution Booklet Issued: No Required Braces or Orthoses: Knee Immobilizer - Right Knee Immobilizer - Right: Other (comment)(in prolonged supine) Restrictions Weight Bearing Restrictions: Yes RLE Weight Bearing: Weight bearing as tolerated    Mobility  Bed Mobility Overal bed mobility: Needs Assistance Bed Mobility: Supine to Sit     Supine to sit: Max assist;+2 for physical assistance     General bed mobility comments: heavy assist required for trunk and RLE;   Transfers Overall transfer level: Needs assistance Equipment used: Rolling walker (2 wheeled) Transfers: Sit to/from  Stand Sit to Stand: From elevated surface;Min assist         General transfer comment: much weaker in afternoon session; ModA from chair, technique fair, but transition from arms fo chair to RW is precarious  Ambulation/Gait Ambulation/Gait assistance: Min guard Gait Distance (Feet): (unable this session. Pt attempts for 7 minutes without success. ) Assistive device: Rolling walker (2 wheeled)       General Gait Details: Pt with very pain limited, guarded and heavily reliant on walker ambulation.  KI doffed does not improve AMB tolerance, and noted buckling without LOB.    Stairs             Wheelchair Mobility    Modified Rankin (Stroke Patients Only)       Balance Overall balance assessment: Mild deficits observed, not formally tested;Needs assistance                                          Cognition Arousal/Alertness: Awake/alert Behavior During Therapy: WFL for tasks assessed/performed Overall Cognitive Status: Within Functional Limits for tasks assessed                                        Exercises Total Joint Exercises Short Arc Quad: Seated;AAROM;Right;10 reps Heel Slides: AAROM;Right;15 reps;Seated    General Comments        Pertinent Vitals/Pain Pain Score: 6  Pain Location:  Right Knee Pain Descriptors / Indicators: ("feels dead" ) Pain Intervention(s): Limited activity within patient's tolerance;Monitored during session;Premedicated before session;Repositioned    Home Living                      Prior Function            PT Goals (current goals can now be found in the care plan section) Acute Rehab PT Goals Patient Stated Goal: To return home PT Goal Formulation: With patient Time For Goal Achievement: 11/26/18 Potential to Achieve Goals: Poor Additional Goals Additional Goal #1: Pt will demonstrate 0-90 degrees of R knee extension/flexion to facilitate safe functional mobility. Progress  towards PT goals: Not progressing toward goals - comment    Frequency    BID      PT Plan Current plan remains appropriate    Co-evaluation              AM-PAC PT "6 Clicks" Mobility   Outcome Measure  Help needed turning from your back to your side while in a flat bed without using bedrails?: A Lot Help needed moving from lying on your back to sitting on the side of a flat bed without using bedrails?: A Lot Help needed moving to and from a bed to a chair (including a wheelchair)?: A Lot Help needed standing up from a chair using your arms (e.g., wheelchair or bedside chair)?: A Lot Help needed to walk in hospital room?: Total Help needed climbing 3-5 steps with a railing? : Total 6 Click Score: 10    End of Session Equipment Utilized During Treatment: Gait belt;Right knee immobilizer Activity Tolerance: Patient limited by pain;Patient limited by fatigue Patient left: with call bell/phone within reach;in chair;with chair alarm set Nurse Communication: Mobility status PT Visit Diagnosis: Unsteadiness on feet (R26.81);Muscle weakness (generalized) (M62.81);Pain Pain - Right/Left: Right Pain - part of body: Knee     Time: 0100-7121 PT Time Calculation (min) (ACUTE ONLY): 31 min  Charges:  $Therapeutic Exercise: 8-22 mins $Therapeutic Activity: 8-22 mins                     1:49 PM, 11/14/18 Etta Grandchild, PT, DPT Physical Therapist - [facility]  [phone] ([place])     Tempie Gibeault C 11/14/2018, 1:46 PM

## 2018-11-15 ENCOUNTER — Inpatient Hospital Stay: Payer: Medicare HMO

## 2018-11-15 LAB — CBC
HCT: 28.4 % — ABNORMAL LOW (ref 39.0–52.0)
Hemoglobin: 9.3 g/dL — ABNORMAL LOW (ref 13.0–17.0)
MCH: 32.4 pg (ref 26.0–34.0)
MCHC: 32.7 g/dL (ref 30.0–36.0)
MCV: 99 fL (ref 80.0–100.0)
Platelets: 148 10*3/uL — ABNORMAL LOW (ref 150–400)
RBC: 2.87 MIL/uL — ABNORMAL LOW (ref 4.22–5.81)
RDW: 13.7 % (ref 11.5–15.5)
WBC: 11.9 10*3/uL — AB (ref 4.0–10.5)
nRBC: 0 % (ref 0.0–0.2)

## 2018-11-15 LAB — URINALYSIS, COMPLETE (UACMP) WITH MICROSCOPIC
Bilirubin Urine: NEGATIVE
Glucose, UA: NEGATIVE mg/dL
Hgb urine dipstick: NEGATIVE
Ketones, ur: NEGATIVE mg/dL
Leukocytes, UA: NEGATIVE
Nitrite: NEGATIVE
Protein, ur: NEGATIVE mg/dL
Specific Gravity, Urine: 1.016 (ref 1.005–1.030)
Squamous Epithelial / HPF: NONE SEEN (ref 0–5)
pH: 5 (ref 5.0–8.0)

## 2018-11-15 LAB — COMPREHENSIVE METABOLIC PANEL
ALT: 20 U/L (ref 0–44)
ANION GAP: 11 (ref 5–15)
AST: 11 U/L — ABNORMAL LOW (ref 15–41)
Albumin: 2.8 g/dL — ABNORMAL LOW (ref 3.5–5.0)
Alkaline Phosphatase: 53 U/L (ref 38–126)
BUN: 39 mg/dL — ABNORMAL HIGH (ref 8–23)
CHLORIDE: 102 mmol/L (ref 98–111)
CO2: 18 mmol/L — ABNORMAL LOW (ref 22–32)
Calcium: 7.7 mg/dL — ABNORMAL LOW (ref 8.9–10.3)
Creatinine, Ser: 1.79 mg/dL — ABNORMAL HIGH (ref 0.61–1.24)
GFR calc Af Amer: 43 mL/min — ABNORMAL LOW (ref >60)
GFR calc non Af Amer: 37 mL/min — ABNORMAL LOW (ref >60)
Glucose, Bld: 137 mg/dL — ABNORMAL HIGH (ref 70–99)
POTASSIUM: 4.3 mmol/L (ref 3.5–5.1)
Sodium: 131 mmol/L — ABNORMAL LOW (ref 135–145)
Total Bilirubin: 0.6 mg/dL (ref 0.3–1.2)
Total Protein: 5.6 g/dL — ABNORMAL LOW (ref 6.5–8.1)

## 2018-11-15 LAB — INFLUENZA PANEL BY PCR (TYPE A & B)
Influenza A By PCR: NEGATIVE
Influenza B By PCR: NEGATIVE

## 2018-11-15 NOTE — Progress Notes (Signed)
Physical Therapy Treatment Patient Details Name: Thomas Carlson MRN: [id number] DOB: Jun 17, 1946 Today's Date: 11/15/2018    History of Present Illness Patient is a 73 year old male admitted to [facility] for a TKA.  PMHx includes seizures, neuropathy, Htn, HLD, heart murmur, gout, DM, DJD, CKD 3 and CAD.    PT Comments     Pt presenting today with nausea, difficulty urinating and overall weakness.  He was able to get to EOB with mod A from PT and sit but unable to manage STS transfer, becoming nauseated and requesting emesis bag.  Pt attempted to use urinal but was unsuccessful.  Pt did perform there ex in supine prior to sitting up and demonstrated decreased quad strength and SLR ability as compared to day of evaluation.  Pt also reporting high pain level throughout treatment.  He was able to complete sets with VC's and manual assistance.  Nurse tech and RN assisted with pt back to supine and scooting up in bed.  Pt left in rm with nurse tech at end of session.  Pt will continue to benefit from skilled PT with focus on strength, tolerance to activity, pain management and knee ROM.  SNF setting is appropriate for pt to address mobility concerns at this time.  Follow Up Recommendations  SNF     Equipment Recommendations  Rolling walker with 5" wheels;3in1 (PT)    Recommendations for Other Services       Precautions / Restrictions Precautions Precautions: Knee Precaution Booklet Issued: No Knee Immobilizer - Right: Other (comment)(in prolonged supine) Restrictions Weight Bearing Restrictions: Yes RLE Weight Bearing: Weight bearing as tolerated    Mobility  Bed Mobility Overal bed mobility: Needs Assistance Bed Mobility: Supine to Sit     Supine to sit: Mod assist Sit to supine: +2 for physical assistance;Max assist;HOB elevated   General bed mobility comments: heavy assist required for trunk and RLE; pt unable to coordinate turning to return upper body to bed due to feeling very  weak and nauseated.  Transfers Overall transfer level: Needs assistance Equipment used: Rolling walker (2 wheeled) Transfers: Sit to/from Stand Sit to Stand: From elevated surface;Max assist         General transfer comment: Pt attempted to stand 3-4 times and stated "I just can't do it" and that he felt overall weak, shaky and nauseated.  Ambulation/Gait Ambulation/Gait assistance: (Deferred)               Stairs             Wheelchair Mobility    Modified Rankin (Stroke Patients Only)       Balance Overall balance assessment: Mild deficits observed, not formally tested;Needs assistance                                          Cognition Arousal/Alertness: Awake/alert Behavior During Therapy: WFL for tasks assessed/performed;Anxious Overall Cognitive Status: Within Functional Limits for tasks assessed                                 General Comments: Pt nauseated and appearing weaker than during evaluation.  Pt reported feeling "weaker overall".      Exercises Total Joint Exercises Ankle Circles/Pumps: Strengthening;20 reps;Both;Supine Quad Sets: 10 reps;Right;AAROM;Supine Short Arc Quad: 10 reps;AAROM;Right;Supine Heel Slides: AAROM;10 reps;Supine;Right Hip ABduction/ADduction: AAROM;10 reps;Right;Supine  General Comments        Pertinent Vitals/Pain Pain Score: 8  Pain Location: R knee Pain Descriptors / Indicators: Aching;Guarding;Grimacing Pain Intervention(s): Monitored during session;Limited activity within patient's tolerance    Home Living                      Prior Function            PT Goals (current goals can now be found in the care plan section) Acute Rehab PT Goals Patient Stated Goal: To return home PT Goal Formulation: With patient Time For Goal Achievement: 11/26/18 Potential to Achieve Goals: Poor Additional Goals Additional Goal #1: Pt will demonstrate 0-90 degrees of R  knee extension/flexion to facilitate safe functional mobility. Progress towards PT goals: Not progressing toward goals - comment(Pt presenting with medical concerns including overall weakness, nausea and difficulty urinating which has affected mobility.)    Frequency    BID      PT Plan Current plan remains appropriate    Co-evaluation              AM-PAC PT "6 Clicks" Mobility   Outcome Measure  Help needed turning from your back to your side while in a flat bed without using bedrails?: A Lot Help needed moving from lying on your back to sitting on the side of a flat bed without using bedrails?: A Lot Help needed moving to and from a bed to a chair (including a wheelchair)?: A Lot Help needed standing up from a chair using your arms (e.g., wheelchair or bedside chair)?: A Lot Help needed to walk in hospital room?: Total Help needed climbing 3-5 steps with a railing? : Total 6 Click Score: 10    End of Session Equipment Utilized During Treatment: Gait belt;Right knee immobilizer Activity Tolerance: Patient limited by pain;Patient limited by fatigue Patient left: in bed;with bed alarm set;with nursing/sitter in room Nurse Communication: Mobility status PT Visit Diagnosis: Unsteadiness on feet (R26.81);Muscle weakness (generalized) (M62.81);Pain Pain - Right/Left: Right Pain - part of body: Knee     Time: 9373-4287 PT Time Calculation (min) (ACUTE ONLY): 20 min  Charges:  $Therapeutic Activity: 8-22 mins                     Roxanne Gates, PT, DPT    Roxanne Gates 11/15/2018, 10:22 AM
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
## 2018-11-15 NOTE — Progress Notes (Signed)
Subjective: 3 Days Post-Op Procedure(s) (LRB): TOTAL KNEE ARTHROPLASTY (Right)   Patient does not feel well today.  He is quite weak and was unable to get out of bed today.  Could not participate with PT.  White blood count is minimally elevated at 11.9.  Blood pressures running a little low.  I will get a medical consult check him for possible sepsis and get urine and blood cultures  as well as repeat urinalysis.  Rectal suppository given for constipation.  Patient reports pain as moderate.  Objective:   VITALS:   Vitals:   11/15/18 0035 11/15/18 0924  BP: (!) 104/57 (!) 115/55  Pulse: 69 74  Resp: 18 18  Temp:  (!) 96.7 F (35.9 C)  SpO2: 97% 95%    Neurologically intact ABD soft Neurovascular intact Sensation intact distally Intact pulses distally Dorsiflexion/Plantar flexion intact Incision: dressing C/D/I  LABS Recent Labs    11/13/18 0431 11/14/18 0354 11/15/18 0259  HGB 9.9* 9.7* 9.3*  HCT 30.3* 29.6* 28.4*  WBC 11.2* 11.7* 11.9*  PLT 146* 139* 148*    Recent Labs    11/13/18 0431  NA 136  K 3.8  BUN 37*  CREATININE 1.36*  GLUCOSE 166*    No results for input(s): LABPT, INR in the last 72 hours.   Assessment/Plan: 3 Days Post-Op Procedure(s) (LRB): TOTAL KNEE ARTHROPLASTY (Right)--patient is not doing well today and we will get further studies.   Advance diet Up with therapy Discharge to SNF tomorrow  Have discussed consult with Dr. Francis Dowse. Urine and blood cultures ordered as well as repeat urinalysis

## 2018-11-15 NOTE — Progress Notes (Signed)
Pt not wanting to take laxative stating that he had a bowel movement on January 17 during the day.

## 2018-11-15 NOTE — Progress Notes (Signed)
Pts bladder scan > 999 ml, MD Gouru notified. Orders received to insert foley. Order placed.

## 2018-11-16 ENCOUNTER — Telehealth: Payer: Self-pay | Admitting: Urology

## 2018-11-16 LAB — BASIC METABOLIC PANEL
Anion gap: 9 (ref 5–15)
BUN: 39 mg/dL — ABNORMAL HIGH (ref 8–23)
CO2: 20 mmol/L — ABNORMAL LOW (ref 22–32)
Calcium: 8 mg/dL — ABNORMAL LOW (ref 8.9–10.3)
Chloride: 102 mmol/L (ref 98–111)
Creatinine, Ser: 1.67 mg/dL — ABNORMAL HIGH (ref 0.61–1.24)
GFR calc Af Amer: 46 mL/min — ABNORMAL LOW (ref >60)
GFR calc non Af Amer: 40 mL/min — ABNORMAL LOW (ref >60)
Glucose, Bld: 137 mg/dL — ABNORMAL HIGH (ref 70–99)
Potassium: 4.7 mmol/L (ref 3.5–5.1)
Sodium: 131 mmol/L — ABNORMAL LOW (ref 135–145)

## 2018-11-16 LAB — PSA: Prostatic Specific Antigen: 0.25 ng/mL (ref 0.00–4.00)

## 2018-11-16 LAB — RESPIRATORY PANEL BY PCR
Adenovirus: NOT DETECTED
Bordetella pertussis: NOT DETECTED
Chlamydophila pneumoniae: NOT DETECTED
Coronavirus 229E: NOT DETECTED
Coronavirus HKU1: NOT DETECTED
Coronavirus NL63: NOT DETECTED
Coronavirus OC43: NOT DETECTED
INFLUENZA A-RVPPCR: NOT DETECTED
Influenza B: NOT DETECTED
Metapneumovirus: NOT DETECTED
Mycoplasma pneumoniae: NOT DETECTED
PARAINFLUENZA VIRUS 4-RVPPCR: NOT DETECTED
Parainfluenza Virus 1: NOT DETECTED
Parainfluenza Virus 2: NOT DETECTED
Parainfluenza Virus 3: NOT DETECTED
Respiratory Syncytial Virus: NOT DETECTED
Rhinovirus / Enterovirus: NOT DETECTED

## 2018-11-16 LAB — CBC
HCT: 26.1 % — ABNORMAL LOW (ref 39.0–52.0)
HEMOGLOBIN: 8.4 g/dL — AB (ref 13.0–17.0)
MCH: 32.2 pg (ref 26.0–34.0)
MCHC: 32.2 g/dL (ref 30.0–36.0)
MCV: 100 fL (ref 80.0–100.0)
Platelets: 182 10*3/uL (ref 150–400)
RBC: 2.61 MIL/uL — ABNORMAL LOW (ref 4.22–5.81)
RDW: 13.8 % (ref 11.5–15.5)
WBC: 10.5 10*3/uL (ref 4.0–10.5)
nRBC: 0 % (ref 0.0–0.2)

## 2018-11-16 LAB — GLUCOSE, CAPILLARY
GLUCOSE-CAPILLARY: 120 mg/dL — AB (ref 70–99)
Glucose-Capillary: 119 mg/dL — ABNORMAL HIGH (ref 70–99)
Glucose-Capillary: 166 mg/dL — ABNORMAL HIGH (ref 70–99)

## 2018-11-16 LAB — MAGNESIUM: Magnesium: 2.2 mg/dL (ref 1.7–2.4)

## 2018-11-16 LAB — TSH: TSH: 1.359 u[IU]/mL (ref 0.350–4.500)

## 2018-11-16 LAB — PHOSPHORUS: PHOSPHORUS: 3.1 mg/dL (ref 2.5–4.6)

## 2018-11-16 MED ORDER — INSULIN ASPART 100 UNIT/ML ~~LOC~~ SOLN: 0.0000 [IU] | Freq: Every day | SUBCUTANEOUS | Status: DC

## 2018-11-16 MED ORDER — TAMSULOSIN HCL 0.4 MG PO CAPS
0.4000 mg | ORAL_CAPSULE | Freq: Every day | ORAL | Status: DC
  Administered 2018-11-16: 0.4 mg via ORAL
  Filled 2018-11-16: qty 1

## 2018-11-16 MED ORDER — ASPIRIN EC 325 MG PO TBEC
325.0000 mg | DELAYED_RELEASE_TABLET | Freq: Two times a day (BID) | ORAL | Status: DC
  Administered 2018-11-16 – 2018-11-17 (×2): 325 mg via ORAL
  Filled 2018-11-16 (×2): qty 1

## 2018-11-16 MED ORDER — INSULIN ASPART 100 UNIT/ML ~~LOC~~ SOLN
0.0000 [IU] | Freq: Three times a day (TID) | SUBCUTANEOUS | Status: DC
  Administered 2018-11-17 (×2): 2 [IU] via SUBCUTANEOUS
  Filled 2018-11-16 (×2): qty 1

## 2018-11-16 NOTE — Progress Notes (Signed)
Per [facility] admissions coordinator at [facility] authorization has been received. Patient can D/C to [facility] when medically stable. Patient is aware of above. Patient's wife [name] is at bedside and aware of above.   [employer], LCSW [phone]

## 2018-11-16 NOTE — Progress Notes (Signed)
Physical Therapy Treatment Patient Details Name: Thomas Carlson MRN: [id number] DOB: [birth year] Today's Date: 11/16/2018    History of Present Illness Patient is a 73 year old male admitted to [facility] for a TKA.  PMHx includes seizures, neuropathy, Htn, HLD, heart murmur, gout, DM, DJD, CKD 3 and CAD.    PT Comments    Thomas Carlson was able to participate in more activity this session compared to the previous, however he is still limited by activity tolerance, pain, weakness, and dizziness. Of note orthostatics were taken in supine 150/68, sitting 126/59, standing 90/58. Pt required Mod A- Max A x2 with bed mobility and Mod A x2 for transfers. Pt completed therapeutic exercises prior to performing 2 sit<>stands and weight shifting while in standing. Pt reported 8/10 dizziness with second sit>stand requested to sit down and PT x2 assisted into supine. In supine pt reported dizziness decreased to 6/10. SpO2 did not drop below 96% and HR did not get above 104 throughout session. RN was notified of orthostatics and dizziness at end of treatment.    Follow Up Recommendations  SNF     Equipment Recommendations  Rolling walker with 5" wheels    Recommendations for Other Services       Precautions / Restrictions Precautions Precautions: Knee Precaution Booklet Issued: No Required Braces or Orthoses: Knee Immobilizer - Right Knee Immobilizer - Right: Other (comment)(prolonged supine and PRN ) Restrictions Weight Bearing Restrictions: Yes RLE Weight Bearing: Weight bearing as tolerated    Mobility  Bed Mobility Overal bed mobility: Needs Assistance Bed Mobility: Supine to Sit;Sit to Supine     Supine to sit: Mod assist;+2 for physical assistance Sit to supine: Max assist;+2 for physical assistance   General bed mobility comments: Pt was able to assist moving legs to EOB from sup>sit but required max A for leg and trunk support moving sit>sup.   Transfers Overall transfer level: Needs  assistance Equipment used: Rolling walker (2 wheeled) Transfers: Sit to/from Stand Sit to Stand: Mod assist;+2 physical assistance         General transfer comment: Pt performed 2 sit to stands. Pain increased from 6 to 8 with transfer. With second sit to stand pt reported feeling 8/10 dizzy and needed to sit.  Ambulation/Gait             General Gait Details: Unable to attempt due to orthostatics.    Stairs             Wheelchair Mobility    Modified Rankin (Stroke Patients Only)       Balance Overall balance assessment: Needs assistance Sitting-balance support: Bilateral upper extremity supported;Feet supported Sitting balance-Leahy Scale: Poor Sitting balance - Comments: Pt required at least 2 UE support to remain steady at EOB.    Standing balance support: Bilateral upper extremity supported;During functional activity Standing balance-Leahy Scale: Poor Standing balance comment: Pt required bilateral UE support in order to maintain upright posture.                            Cognition Arousal/Alertness: Awake/alert Behavior During Therapy: WFL for tasks assessed/performed;Anxious Overall Cognitive Status: Within Functional Limits for tasks assessed                                 General Comments: Pt reported feeling tired. Multiple cues required throughout session for safe management of RW with transfers.  Exercises Total Joint Exercises Goniometric ROM: -6-38 General Exercises - Lower Extremity Ankle Circles/Pumps: AROM;Both;15 reps;Supine Quad Sets: AROM;5 reps;Right;Supine Heel Slides: AAROM;Right;5 reps;Supine(PT assisted) Straight Leg Raises: AAROM;Strengthening;Right;5 reps;Supine Other Exercises Other Exercises: Pt instructed to weight shift R to L in standing x4 each way.     General Comments General comments (skin integrity, edema, etc.): Pt became slightly pale with activty, color returned with rest breaks.  BP in supine 150/86, pt in sitting 126/59, in standing 90/58, PT instructed pt to sit and BP 137/63, instructed pt to stand 110/62 at which point pt reported needing to sit while weight shifting due to 8/10 dizziness. In supine BP 169/68.       Pertinent Vitals/Pain Pain Assessment: 0-10 Pain Score: 8 (with activity, 6 at rest ) Pain Location: R knee Pain Descriptors / Indicators: Aching;Guarding;Grimacing;Discomfort;Tiring Pain Intervention(s): Monitored during session;Premedicated before session;Utilized relaxation techniques;Ice applied    Home Living                      Prior Function            PT Goals (current goals can now be found in the care plan section) Acute Rehab PT Goals Patient Stated Goal: To return home PT Goal Formulation: With patient Time For Goal Achievement: 11/26/18 Potential to Achieve Goals: Poor Progress towards PT goals: Not progressing toward goals - comment(+ orthostatics)    Frequency    BID      PT Plan Current plan remains appropriate    Co-evaluation              AM-PAC PT "6 Clicks" Mobility   Outcome Measure  Help needed turning from your back to your side while in a flat bed without using bedrails?: A Lot Help needed moving from lying on your back to sitting on the side of a flat bed without using bedrails?: A Lot Help needed moving to and from a bed to a chair (including a wheelchair)?: A Lot Help needed standing up from a chair using your arms (e.g., wheelchair or bedside chair)?: A Lot Help needed to walk in hospital room?: Total Help needed climbing 3-5 steps with a railing? : Total 6 Click Score: 10    End of Session Equipment Utilized During Treatment: Gait belt;Right knee immobilizer(ice machine) Activity Tolerance: Patient limited by fatigue(pt limited by dizziness ) Patient left: in bed;with call bell/phone within reach;with bed alarm set;with family/visitor present;with SCD's reapplied(polar care, towel  roll) Nurse Communication: Mobility status;Other (comment)(orthostatic values and dizziness. ) PT Visit Diagnosis: Unsteadiness on feet (R26.81);Muscle weakness (generalized) (M62.81);Pain Pain - Right/Left: Right Pain - part of body: Knee     Time: 8333-8329 PT Time Calculation (min) (ACUTE ONLY): 55 min  Charges:               Dorothy Spark, SPT           11/16/2018, 1:22 PM

## 2018-11-16 NOTE — Plan of Care (Signed)

## 2018-11-16 NOTE — Telephone Encounter (Signed)
This is a patient in urinary retention he is being discharged to SNF with Foley catheter.  He needs a follow-up visit with any provider in 1 week as a new patient in the morning to establish care/voiding trial.  Hollice Espy, MD

## 2018-11-16 NOTE — Care Management Important Message (Signed)
Important Message  Patient Details  Name: Thomas Carlson MRN: [id number] Date of Birth: 12/25/1945   Medicare Important Message Given:  Yes    Juliann Pulse A Anelisse Jacobson 11/16/2018, 11:04 AM

## 2018-11-16 NOTE — Progress Notes (Signed)
Upon arriving in pt's room, this nurse noticed an unopen bottle of whiskey on pt's bedside table. This nurse advised pt that he was not allowed to have it. Pt agreeable and allowed this nurse to take bottle out of room. Bottle given to Tiffiany (Camera operator) and will give back when pt discharges.

## 2018-11-16 NOTE — Progress Notes (Signed)
Physical Therapy Treatment Patient Details Name: Thomas Carlson MRN: [id number] DOB: 1946/08/24 Today's Date: 11/16/2018    History of Present Illness Patient is a 73 year old male admitted to [facility] for a TKA.  PMHx includes seizures, neuropathy, Htn, HLD, heart murmur, gout, DM, DJD, CKD 3 and CAD.    PT Comments    Pt's mobility continues to be limited due to symptomatic +orthostatics with supine>sit>stand.  Performed sit>stand x2 from EOB but ultimately required return to supine due to orthostatics.  RN notified of +orthostatics.  Follow up recommendations remain appropriate.   Follow Up Recommendations  SNF     Equipment Recommendations  Rolling walker with 5" wheels    Recommendations for Other Services       Precautions / Restrictions Precautions Precautions: Knee;Fall Required Braces or Orthoses: Knee Immobilizer - Right Knee Immobilizer - Right: Other (comment)(prolonged supine and PRN ) Restrictions Weight Bearing Restrictions: Yes RLE Weight Bearing: Weight bearing as tolerated    Mobility  Bed Mobility Overal bed mobility: Needs Assistance Bed Mobility: Supine to Sit;Sit to Supine     Supine to sit: +2 for physical assistance;Min assist;HOB elevated Sit to supine: +2 for physical assistance;Mod assist   General bed mobility comments: Increased time and effort for supine>sit with assist to advance LEs to EOB.  Heavy use of bed rail. Cues for sequencing.  Assist to bring BLEs into bed with return to supine.   Transfers Overall transfer level: Needs assistance Equipment used: Rolling walker (2 wheeled) Transfers: Sit to/from Stand Sit to Stand: +2 physical assistance;Min assist;From elevated surface         General transfer comment: Sit>stand from elevated surface performed x2.  +orthostatic on first sit>stand.  On second  sit>stand unable to take BP as pt instructed to sit due to LUE shakiness and pt fluttering his eyes.  Pt quickly assisted back to  supine.   Ambulation/Gait             General Gait Details: Not safe to attempt at this time due to symptomatic +orthostatics   Stairs             Wheelchair Mobility    Modified Rankin (Stroke Patients Only)       Balance Overall balance assessment: Needs assistance Sitting-balance support: Bilateral upper extremity supported;Feet supported Sitting balance-Leahy Scale: Poor Sitting balance - Comments: Pt required at least 2 UE support to remain steady at EOB.    Standing balance support: Bilateral upper extremity supported;During functional activity Standing balance-Leahy Scale: Poor Standing balance comment: Pt required bilateral UE support in order to maintain upright posture.                            Cognition Arousal/Alertness: Awake/alert Behavior During Therapy: WFL for tasks assessed/performed;Anxious Overall Cognitive Status: Within Functional Limits for tasks assessed                                        Exercises      General Comments General comments (skin integrity, edema, etc.): Supine BP 132/58, 7/10 dizziness with supine>sit. Sitting EOB BP 99/70 dizziness 6/10. Sitting EOB after several minutes BP 126/58, standing at bedside 87/54, dizziness 5/10. Pt instructed to sit EOB and BP 126/53. Stood again at bedside BP and reported immediate dizziness, noted LUE began to shake so instructed pt to return  to sitting and then was assisted to supine.  BP in supine at end of session 153/69.  RN notified of BP readings. SpO2 remains in 90s and HR in 80s-90s throughout session.       Pertinent Vitals/Pain Pain Assessment: 0-10 Pain Score: 5  Pain Location: R knee Pain Descriptors / Indicators: Aching;Guarding;Grimacing;Discomfort;Tiring Pain Intervention(s): Limited activity within patient's tolerance;Monitored during session;Repositioned;Utilized relaxation techniques    Home Living                      Prior  Function            PT Goals (current goals can now be found in the care plan section) Acute Rehab PT Goals Patient Stated Goal: To return home PT Goal Formulation: With patient Time For Goal Achievement: 11/26/18 Potential to Achieve Goals: Fair Progress towards PT goals: Not progressing toward goals - comment(due to continued orthostatics)    Frequency    BID      PT Plan Current plan remains appropriate    Co-evaluation              AM-PAC PT "6 Clicks" Mobility   Outcome Measure  Help needed turning from your back to your side while in a flat bed without using bedrails?: A Little Help needed moving from lying on your back to sitting on the side of a flat bed without using bedrails?: A Little Help needed moving to and from a bed to a chair (including a wheelchair)?: A Lot Help needed standing up from a chair using your arms (e.g., wheelchair or bedside chair)?: A Lot Help needed to walk in hospital room?: Total Help needed climbing 3-5 steps with a railing? : Total 6 Click Score: 12    End of Session Equipment Utilized During Treatment: Gait belt;Right knee immobilizer Activity Tolerance: Patient limited by fatigue;Treatment limited secondary to medical complications (Comment)(limited by +orthostatics) Patient left: in bed;with call bell/phone within reach;with bed alarm set;with SCD's reapplied;Other (comment)(polar care, towel roll) Nurse Communication: Mobility status;Other (comment)(orthostatic values and dizziness) PT Visit Diagnosis: Unsteadiness on feet (R26.81);Muscle weakness (generalized) (M62.81);Pain Pain - Right/Left: Right Pain - part of body: Knee     Time: 7494-4967 PT Time Calculation (min) (ACUTE ONLY): 34 min  Charges:  $Therapeutic Activity: 23-37 mins                     Collie Siad PT, DPT 11/16/2018, 4:22 PM

## 2018-11-16 NOTE — Progress Notes (Signed)
[employer] [facility] [place] NAME: Thomas Carlson    MR#:  [id number]  DATE OF BIRTH:  02/27/1946  SUBJECTIVE:  CHIEF COMPLAINT:  No chief complaint on file.   No new complaint this morning.  Patient developed urinary retention yesterday and found to have greater than 953ml on bladder scan.  Foley catheter was placed.  Making adequate urine in Foley catheter.  Denies any new complaint this morning.  Had a bowel movement yesterday.  No fevers. Sitting up and having breakfast.  REVIEW OF SYSTEMS:  Review of Systems  Constitutional: Negative for chills and fever.  HENT: Negative for congestion, hearing loss and tinnitus.   Eyes: Negative for blurred vision, double vision and photophobia.  Respiratory: Negative for cough, hemoptysis and sputum production.   Cardiovascular: Negative for chest pain and palpitations.  Gastrointestinal: Negative for heartburn, nausea and vomiting.  Genitourinary: Negative for dysuria and urgency.       Foley catheter placed on 11/15/2018 due to urinary retention.  Musculoskeletal: Negative for myalgias and neck pain.  Skin: Negative for itching and rash.  Neurological: Negative for dizziness and headaches.  Psychiatric/Behavioral: Negative for depression and hallucinations.    DRUG ALLERGIES:   Allergies  Allergen Reactions  . Gabapentin Other (See Comments)    unknown  . Bactrim [Sulfamethoxazole-Trimethoprim] Rash    Diffuse drug reaction - maculopapular rash  . Penicillins Hives    Has patient had a PCN reaction causing immediate rash, facial/tongue/throat swelling, SOB or lightheadedness with hypotension: Unknown Has patient had a PCN reaction causing severe rash involving mucus membranes or skin necrosis: Unknown Has patient had a PCN reaction that required hospitalization: Unknown Has patient had a PCN reaction occurring within the last 10 years: Unknown If all of the above answers are "NO", then may  proceed with Cephalosporin use.   . Lasix [Furosemide] Other (See Comments)    Drops blood pressure and drained potassium and magnesium   VITALS:  Blood pressure 119/60, pulse 84, temperature (!) 97.5 F (36.4 C), temperature source Oral, resp. rate 18, height 5' 7.5" (1.715 m), weight 114.5 kg, SpO2 99 %. PHYSICAL EXAMINATION:  Physical Exam  Constitutional: He is oriented to person, place, and time. He appears well-developed and well-nourished.  HENT:  Head: Normocephalic and atraumatic.  Eyes: Pupils are equal, round, and reactive to light. Conjunctivae and EOM are normal.  Neck: Normal range of motion. Neck supple. No thyromegaly present.  Cardiovascular: Normal rate, regular rhythm and normal heart sounds.  Respiratory: Effort normal. No respiratory distress. He has no wheezes.  GI: Soft. Bowel sounds are normal. He exhibits no distension. There is no abdominal tenderness.  Musculoskeletal:        General: No edema.     Comments: Dressing in place to right knee joint status post recent knee surgery  Neurological: He is alert and oriented to person, place, and time.  Skin: Skin is warm. No erythema.  Psychiatric: He has a normal mood and affect. Thought content normal.    LABORATORY PANEL:  Male CBC Recent Labs  Lab 11/16/18 0238  WBC 10.5  HGB 8.4*  HCT 26.1*  PLT 182   ------------------------------------------------------------------------------------------------------------------ Chemistries  Recent Labs  Lab 11/15/18 1339 11/16/18 0238  NA 131* 131*  K 4.3 4.7  CL 102 102  CO2 18* 20*  GLUCOSE 137* 137*  BUN 39* 39*  CREATININE 1.79* 1.67*  CALCIUM 7.7* 8.0*  MG  --  2.2  AST 11*  --  ALT 20  --   ALKPHOS 53  --   BILITOT 0.6  --    RADIOLOGY:  Dg Chest 2 View  Result Date: 11/15/2018 CLINICAL DATA:  Shortness of breath. Status post knee replacement 3 days ago. EXAM: CHEST - 2 VIEW COMPARISON:  October 29, 2018 FINDINGS: The heart size and  mediastinal contours are within normal limits. Both lungs are clear. The visualized skeletal structures are unremarkable. IMPRESSION: No active cardiopulmonary disease. Electronically Signed   By: Dorise Bullion III M.D   On: 11/15/2018 14:43   ASSESSMENT AND PLAN:   1. Generalized weakness- Appear improved.  Patient denied any weakness today.   So far no evidence of infectious process found.  Influenza test done was negative.  TSH checked was normal.  Chest x-ray negative.  Urinalysis negative.   Physical therapy to continue working with patient.  Anticipate discharge to skilled nursing facility by orthopedic team for continued therapy once bed available  2. Urinary retention-could be from narcotics and constipation as well as decreased mobility due to recent surgery Noted greater than 974ml on bladder scan yesterday.  Foley catheter was placed. Recommend voiding trial when patient is more active with physical therapy.  If patient fails voiding trial at that time, urology consultation will be appropriate. Constipation resolved.  Patient had a bowel movement yesterday. PSA was normal at 0.25  3.Hypertension Blood pressure is controlled with recent blood pressure of 119/60.  To resume lisinopril if blood pressure begins to trend up prior  4. Hyperlipidemia continue statin  #Multiple bruises on the lower extremities Platelet count is in the low normal range No active bleeding noticed If bruises are consistent outpatient follow-up with hematology will be beneficial  5. Right total knee arthroplasty postop day 4 Postop care by orthopedics Continue physical therapy  6. Chronic lower extremity weakness following back surgery in June 2019 Patient is supposed to see neurology as an outpatient after discharge Denies any worsening of the lower extremity weakness Continue physical therapy  7.  Diabetes mellitus type 2 Holding off on metformin due to recent decline in renal  function Placed on sliding scale insulin coverage for now.  8.  Acute kidney injury Noted increasing creatinine from 1.36-1.79 yesterday.  Renal function already improving with creatinine down to 1.67.  Follow-up on BMP in a.m. Likely prerenal.  Patient already on normal saline at 75 cc an hour.  DVT prophylaxis;Lovenox   All the records are reviewed and case discussed with Care Management/Social Worker. Management plans discussed with the patient, ad he is in agreement.  CODE STATUS: Full Code  TOTAL TIME TAKING CARE OF THIS PATIENT: 35 minutes.   More than 50% of the time was spent in counseling/coordination of care: YES  Discharge plans per orthopedic service.   Giordano Getman M.D on 11/16/2018 at 10:21 AM  Between 7am to 6pm - Pager - [phone]  After 6pm go to [email or web address] - Proofreader  [employer] [facility]  Office  [phone]  CC: Primary care physician; Ria Bush, MD  Note: This dictation was prepared with Dragon dictation along with smaller phrase technology. Any transcriptional errors that result from this process are unintentional.

## 2018-11-16 NOTE — Progress Notes (Signed)
Subjective:  POD # 4 s/p right TKA.  Patient reports right knee pain as mild to moderate.  Patient had fever workup over weekend.   Negative so far.  Patient with bruising of all 4 extremities.  Had episode of hypotension with physical therapy this morning.  Objective:   VITALS:   Vitals:   11/15/18 0924 11/15/18 2311 11/16/18 0729 11/16/18 0901  BP: (!) 115/55 (!) 134/58 137/68 119/60  Pulse: 74 90 84   Resp: 18 18    Temp: (!) 96.7 F (35.9 C) 98 F (36.7 C) (!) 97.5 F (36.4 C)   TempSrc: Axillary Oral Oral   SpO2: 95% 97% 99%   Weight:      Height:        PHYSICAL EXAM: Right knee: Incision clean dry and intact.  + ecchymosis.  No significant swelling or effusion.  Compartments soft and compressible.  Palpable pedal pulses distally with intact sensation light touch and intact motor function.  TED stockings and Venodyne's in place.  Significant ecchymosis in the medial aspect of both forearms where preop attempted to place IVs.  Forearm compartments are soft and compressible.  Is neurovascular intact in both upper extremities.  She also has ecchymosis over the anterior left lower leg.  Compartments are soft and compressible and he is neurovascular intact.   LABS  Results for orders placed or performed during the hospital encounter of 11/12/18 (from the past 24 hour(s))  Culture, blood (routine x 2)     Status: None (Preliminary result)   Collection Time: 11/15/18  1:39 PM  Result Value Ref Range   Specimen Description BLOOD LEFT ANTECUBITAL    Special Requests      BOTTLES DRAWN AEROBIC AND ANAEROBIC Blood Culture results may not be optimal due to an inadequate volume of blood received in culture bottles   Culture      NO GROWTH < 24 HOURS Performed at [facility], [address] [place], [place] [address]    Report Status PENDING   Comprehensive metabolic panel     Status: Abnormal   Collection Time: 11/15/18  1:39 PM  Result Value Ref Range   Sodium  131 (L) 135 - 145 mmol/L   Potassium 4.3 3.5 - 5.1 mmol/L   Chloride 102 98 - 111 mmol/L   CO2 18 (L) 22 - 32 mmol/L   Glucose, Bld 137 (H) 70 - 99 mg/dL   BUN 39 (H) 8 - 23 mg/dL   Creatinine, Ser 1.79 (H) 0.61 - 1.24 mg/dL   Calcium 7.7 (L) 8.9 - 10.3 mg/dL   Total Protein 5.6 (L) 6.5 - 8.1 g/dL   Albumin 2.8 (L) 3.5 - 5.0 g/dL   AST 11 (L) 15 - 41 U/L   ALT 20 0 - 44 U/L   Alkaline Phosphatase 53 38 - 126 U/L   Total Bilirubin 0.6 0.3 - 1.2 mg/dL   GFR calc non Af Amer 37 (L) >60 mL/min   GFR calc Af Amer 43 (L) >60 mL/min   Anion gap 11 5 - 15  Culture, blood (routine x 2)     Status: None (Preliminary result)   Collection Time: 11/15/18  2:07 PM  Result Value Ref Range   Specimen Description BLOOD LEFT HAND    Special Requests      BOTTLES DRAWN AEROBIC AND ANAEROBIC Blood Culture adequate volume   Culture      NO GROWTH < 24 HOURS Performed at [facility], [address] [place], [place] [address]    Report Status PENDING   Influenza panel by PCR (type A & B)     Status: None   Collection Time: 11/15/18  3:33 PM  Result Value Ref Range   Influenza A By PCR NEGATIVE NEGATIVE   Influenza B By PCR NEGATIVE NEGATIVE  Respiratory Panel by PCR     Status: None   Collection Time: 11/15/18  3:33 PM  Result Value Ref Range   Adenovirus NOT DETECTED NOT DETECTED   Coronavirus 229E NOT DETECTED NOT DETECTED   Coronavirus HKU1 NOT DETECTED NOT DETECTED   Coronavirus NL63 NOT DETECTED NOT DETECTED   Coronavirus OC43 NOT DETECTED NOT DETECTED   Metapneumovirus NOT DETECTED NOT DETECTED   Rhinovirus / Enterovirus NOT DETECTED NOT DETECTED   Influenza A NOT DETECTED NOT DETECTED   Influenza B NOT DETECTED NOT DETECTED   Parainfluenza Virus 1 NOT DETECTED NOT DETECTED   Parainfluenza Virus 2 NOT DETECTED NOT DETECTED   Parainfluenza Virus 3 NOT DETECTED NOT DETECTED   Parainfluenza Virus 4 NOT DETECTED NOT DETECTED   Respiratory Syncytial Virus NOT DETECTED NOT  DETECTED   Bordetella pertussis NOT DETECTED NOT DETECTED   Chlamydophila pneumoniae NOT DETECTED NOT DETECTED   Mycoplasma pneumoniae NOT DETECTED NOT DETECTED  Urinalysis, Complete w Microscopic     Status: Abnormal   Collection Time: 11/15/18  3:33 PM  Result Value Ref Range   Color, Urine AMBER (A) YELLOW   APPearance HAZY (A) CLEAR   Specific Gravity, Urine 1.016 1.005 - 1.030   pH 5.0 5.0 - 8.0   Glucose, UA NEGATIVE NEGATIVE mg/dL   Hgb urine dipstick NEGATIVE NEGATIVE   Bilirubin Urine NEGATIVE NEGATIVE   Ketones, ur NEGATIVE NEGATIVE mg/dL   Protein, ur NEGATIVE NEGATIVE mg/dL   Nitrite NEGATIVE NEGATIVE   Leukocytes, UA NEGATIVE NEGATIVE   RBC / HPF 0-5 0 - 5 RBC/hpf   WBC, UA 0-5 0 - 5 WBC/hpf   Bacteria, UA RARE (A) NONE SEEN   Squamous Epithelial / LPF NONE SEEN 0 - 5   Mucus PRESENT    Hyaline Casts, UA PRESENT   PSA     Status: None   Collection Time: 11/16/18  2:38 AM  Result Value Ref Range   Prostatic Specific Antigen 0.25 0.00 - 4.00 ng/mL  TSH     Status: None   Collection Time: 11/16/18  2:38 AM  Result Value Ref Range   TSH 1.359 0.350 - 4.500 uIU/mL  CBC     Status: Abnormal   Collection Time: 11/16/18  2:38 AM  Result Value Ref Range   WBC 10.5 4.0 - 10.5 K/uL   RBC 2.61 (L) 4.22 - 5.81 MIL/uL   Hemoglobin 8.4 (L) 13.0 - 17.0 g/dL   HCT 26.1 (L) 39.0 - 52.0 %   MCV 100.0 80.0 - 100.0 fL   MCH 32.2 26.0 - 34.0 pg   MCHC 32.2 30.0 - 36.0 g/dL   RDW 13.8 11.5 - 15.5 %   Platelets 182 150 - 400 K/uL   nRBC 0.0 0.0 - 0.2 %  Basic metabolic panel     Status: Abnormal   Collection Time: 11/16/18  2:38 AM  Result Value Ref Range   Sodium 131 (L) 135 - 145 mmol/L   Potassium 4.7 3.5 - 5.1 mmol/L   Chloride 102 98 - 111 mmol/L   CO2 20 (L) 22 - 32 mmol/L   Glucose, Bld 137 (H) 70 - 99 mg/dL  BUN 39 (H) 8 - 23 mg/dL   Creatinine, Ser 1.67 (H) 0.61 - 1.24 mg/dL   Calcium 8.0 (L) 8.9 - 10.3 mg/dL   GFR calc non Af Amer 40 (L) >60 mL/min   GFR calc  Af Amer 46 (L) >60 mL/min   Anion gap 9 5 - 15  Magnesium     Status: None   Collection Time: 11/16/18  2:38 AM  Result Value Ref Range   Magnesium 2.2 1.7 - 2.4 mg/dL  Phosphorus     Status: None   Collection Time: 11/16/18  2:38 AM  Result Value Ref Range   Phosphorus 3.1 2.5 - 4.6 mg/dL  Glucose, capillary     Status: Abnormal   Collection Time: 11/16/18 11:48 AM  Result Value Ref Range   Glucose-Capillary 119 (H) 70 - 99 mg/dL   Comment 1 Notify RN     Dg Chest 2 View  Result Date: 11/15/2018 CLINICAL DATA:  Shortness of breath. Status post knee replacement 3 days ago. EXAM: CHEST - 2 VIEW COMPARISON:  October 29, 2018 FINDINGS: The heart size and mediastinal contours are within normal limits. Both lungs are clear. The visualized skeletal structures are unremarkable. IMPRESSION: No active cardiopulmonary disease. Electronically Signed   By: Dorise Bullion III M.D   On: 11/15/2018 14:43    Assessment/Plan: 4 Days Post-Op   Active Problems:   Total knee replacement status, right  Will continue to monitor the patient overnight due to his hypotension today.  I have spoken with Dr. Erlene Quan from urology who recommended keeping his Foley catheter in place for 1 week.  She will see him in follow-up in her office for catheter removal.  Patient will be discharged on Flomax 0.4 g nightly.  Going to stop the patient's Lovenox due to his ecchymosis.  I will start him on enteric-coated aspirin 325 mg p.o. twice daily for DVT prophylaxis instead.  Continue physical therapy.  Possible discharge to [facility] tomorrow pending the patient's clinical condition.    Thornton Park , MD 11/16/2018, 1:01 PM

## 2018-11-16 NOTE — Telephone Encounter (Signed)
App was made and spoke with Thomas Carlson on 1-C and gave her the follow up app   Thomas Carlson

## 2018-11-17 DIAGNOSIS — Z471 Aftercare following joint replacement surgery: Secondary | ICD-10-CM | POA: Diagnosis not present

## 2018-11-17 DIAGNOSIS — G4733 Obstructive sleep apnea (adult) (pediatric): Secondary | ICD-10-CM | POA: Diagnosis not present

## 2018-11-17 DIAGNOSIS — R52 Pain, unspecified: Secondary | ICD-10-CM | POA: Diagnosis not present

## 2018-11-17 DIAGNOSIS — Z7401 Bed confinement status: Secondary | ICD-10-CM | POA: Diagnosis not present

## 2018-11-17 DIAGNOSIS — I10 Essential (primary) hypertension: Secondary | ICD-10-CM | POA: Diagnosis not present

## 2018-11-17 DIAGNOSIS — E1140 Type 2 diabetes mellitus with diabetic neuropathy, unspecified: Secondary | ICD-10-CM | POA: Diagnosis not present

## 2018-11-17 DIAGNOSIS — R600 Localized edema: Secondary | ICD-10-CM | POA: Diagnosis not present

## 2018-11-17 DIAGNOSIS — M1711 Unilateral primary osteoarthritis, right knee: Secondary | ICD-10-CM | POA: Diagnosis not present

## 2018-11-17 DIAGNOSIS — E1169 Type 2 diabetes mellitus with other specified complication: Secondary | ICD-10-CM | POA: Diagnosis not present

## 2018-11-17 DIAGNOSIS — T8149XA Infection following a procedure, other surgical site, initial encounter: Secondary | ICD-10-CM | POA: Diagnosis not present

## 2018-11-17 DIAGNOSIS — E119 Type 2 diabetes mellitus without complications: Secondary | ICD-10-CM | POA: Diagnosis not present

## 2018-11-17 DIAGNOSIS — E1159 Type 2 diabetes mellitus with other circulatory complications: Secondary | ICD-10-CM | POA: Diagnosis not present

## 2018-11-17 DIAGNOSIS — I25118 Atherosclerotic heart disease of native coronary artery with other forms of angina pectoris: Secondary | ICD-10-CM | POA: Diagnosis not present

## 2018-11-17 DIAGNOSIS — I951 Orthostatic hypotension: Secondary | ICD-10-CM | POA: Diagnosis not present

## 2018-11-17 DIAGNOSIS — D649 Anemia, unspecified: Secondary | ICD-10-CM | POA: Diagnosis not present

## 2018-11-17 DIAGNOSIS — M1A29X0 Drug-induced chronic gout, multiple sites, without tophus (tophi): Secondary | ICD-10-CM | POA: Diagnosis not present

## 2018-11-17 DIAGNOSIS — T8141XA Infection following a procedure, superficial incisional surgical site, initial encounter: Secondary | ICD-10-CM | POA: Diagnosis not present

## 2018-11-17 DIAGNOSIS — K219 Gastro-esophageal reflux disease without esophagitis: Secondary | ICD-10-CM | POA: Diagnosis not present

## 2018-11-17 DIAGNOSIS — Z96651 Presence of right artificial knee joint: Secondary | ICD-10-CM | POA: Diagnosis not present

## 2018-11-17 DIAGNOSIS — R531 Weakness: Secondary | ICD-10-CM | POA: Diagnosis not present

## 2018-11-17 DIAGNOSIS — N400 Enlarged prostate without lower urinary tract symptoms: Secondary | ICD-10-CM | POA: Diagnosis not present

## 2018-11-17 DIAGNOSIS — R338 Other retention of urine: Secondary | ICD-10-CM | POA: Diagnosis not present

## 2018-11-17 DIAGNOSIS — E1122 Type 2 diabetes mellitus with diabetic chronic kidney disease: Secondary | ICD-10-CM | POA: Diagnosis not present

## 2018-11-17 DIAGNOSIS — L03115 Cellulitis of right lower limb: Secondary | ICD-10-CM | POA: Diagnosis not present

## 2018-11-17 DIAGNOSIS — N183 Chronic kidney disease, stage 3 (moderate): Secondary | ICD-10-CM | POA: Diagnosis not present

## 2018-11-17 DIAGNOSIS — K52832 Lymphocytic colitis: Secondary | ICD-10-CM | POA: Diagnosis not present

## 2018-11-17 DIAGNOSIS — E871 Hypo-osmolality and hyponatremia: Secondary | ICD-10-CM | POA: Diagnosis not present

## 2018-11-17 DIAGNOSIS — M47817 Spondylosis without myelopathy or radiculopathy, lumbosacral region: Secondary | ICD-10-CM | POA: Diagnosis not present

## 2018-11-17 DIAGNOSIS — M2550 Pain in unspecified joint: Secondary | ICD-10-CM | POA: Diagnosis not present

## 2018-11-17 DIAGNOSIS — A6002 Herpesviral infection of other male genital organs: Secondary | ICD-10-CM | POA: Diagnosis not present

## 2018-11-17 DIAGNOSIS — E785 Hyperlipidemia, unspecified: Secondary | ICD-10-CM | POA: Diagnosis not present

## 2018-11-17 LAB — CBC
HCT: 24.8 % — ABNORMAL LOW (ref 39.0–52.0)
Hemoglobin: 8.1 g/dL — ABNORMAL LOW (ref 13.0–17.0)
MCH: 31.8 pg (ref 26.0–34.0)
MCHC: 32.7 g/dL (ref 30.0–36.0)
MCV: 97.3 fL (ref 80.0–100.0)
PLATELETS: 195 10*3/uL (ref 150–400)
RBC: 2.55 MIL/uL — ABNORMAL LOW (ref 4.22–5.81)
RDW: 13.6 % (ref 11.5–15.5)
WBC: 6.7 10*3/uL (ref 4.0–10.5)
nRBC: 0 % (ref 0.0–0.2)

## 2018-11-17 LAB — BASIC METABOLIC PANEL
Anion gap: 9 (ref 5–15)
BUN: 29 mg/dL — ABNORMAL HIGH (ref 8–23)
CALCIUM: 8 mg/dL — AB (ref 8.9–10.3)
CO2: 22 mmol/L (ref 22–32)
Chloride: 101 mmol/L (ref 98–111)
Creatinine, Ser: 1.26 mg/dL — ABNORMAL HIGH (ref 0.61–1.24)
GFR calc Af Amer: >60 mL/min (ref >60)
GFR calc non Af Amer: 56 mL/min — ABNORMAL LOW (ref >60)
Glucose, Bld: 154 mg/dL — ABNORMAL HIGH (ref 70–99)
Potassium: 4.1 mmol/L (ref 3.5–5.1)
Sodium: 132 mmol/L — ABNORMAL LOW (ref 135–145)

## 2018-11-17 LAB — URINE CULTURE: Culture: NO GROWTH

## 2018-11-17 LAB — GLUCOSE, CAPILLARY
Glucose-Capillary: 160 mg/dL — ABNORMAL HIGH (ref 70–99)
Glucose-Capillary: 162 mg/dL — ABNORMAL HIGH (ref 70–99)

## 2018-11-17 LAB — MAGNESIUM: Magnesium: 2 mg/dL (ref 1.7–2.4)

## 2018-11-17 MED ORDER — OXYCODONE HCL 5 MG PO TABS: 5.0000 mg | ORAL_TABLET | ORAL | 0 refills | Status: DC | PRN

## 2018-11-17 MED ORDER — TAMSULOSIN HCL 0.4 MG PO CAPS: 0.4000 mg | ORAL_CAPSULE | Freq: Every day | ORAL | 1 refills | Status: DC

## 2018-11-17 MED ORDER — DOCUSATE SODIUM 100 MG PO CAPS: 100.0000 mg | ORAL_CAPSULE | Freq: Two times a day (BID) | ORAL | 0 refills | Status: DC

## 2018-11-17 MED ORDER — ACETAMINOPHEN 325 MG PO TABS: 650.0000 mg | ORAL_TABLET | Freq: Four times a day (QID) | ORAL | 1 refills | Status: DC | PRN

## 2018-11-17 MED ORDER — INSULIN ASPART 100 UNIT/ML ~~LOC~~ SOLN: 0.0000 [IU] | Freq: Every day | SUBCUTANEOUS | 11 refills | Status: DC

## 2018-11-17 MED ORDER — BISACODYL 10 MG RE SUPP: 10.0000 mg | Freq: Every day | RECTAL | 0 refills | Status: DC | PRN

## 2018-11-17 MED ORDER — ASPIRIN 325 MG PO TBEC: 325.0000 mg | DELAYED_RELEASE_TABLET | Freq: Two times a day (BID) | ORAL | 0 refills | Status: DC

## 2018-11-17 MED ORDER — TRAMADOL HCL 50 MG PO TABS: 50.0000 mg | ORAL_TABLET | Freq: Four times a day (QID) | ORAL | 0 refills | Status: DC | PRN

## 2018-11-17 MED ORDER — INSULIN ASPART 100 UNIT/ML ~~LOC~~ SOLN: 0.0000 [IU] | Freq: Three times a day (TID) | SUBCUTANEOUS | 11 refills | Status: DC

## 2018-11-17 NOTE — Progress Notes (Signed)
[facility] NAME: Thomas Carlson    MR#:  [id number]  DATE OF BIRTH:  10/28/1946  SUBJECTIVE:  CHIEF COMPLAINT:  No chief complaint on file.  No new complaint this morning.  Patient already seen by urologist with recommendation to discharge patient with Foley catheter and continue Flomax on discharge.  Patient did better with physical therapist yesterday.  Plans for discharge to skilled nursing facility by primary team later today.   REVIEW OF SYSTEMS:  Review of Systems  Constitutional: Negative for chills and fever.  HENT: Negative for congestion, hearing loss and tinnitus.   Eyes: Negative for blurred vision, double vision and photophobia.  Respiratory: Negative for cough, hemoptysis and sputum production.   Cardiovascular: Negative for chest pain and palpitations.  Gastrointestinal: Negative for heartburn, nausea and vomiting.  Genitourinary: Negative for dysuria and urgency.       Foley catheter placed on 11/15/2018 due to urinary retention.  Musculoskeletal: Negative for myalgias and neck pain.  Skin: Negative for itching and rash.  Neurological: Negative for dizziness and headaches.  Psychiatric/Behavioral: Negative for depression and hallucinations.    DRUG ALLERGIES:   Allergies  Allergen Reactions  . Gabapentin Other (See Comments)    unknown  . Bactrim [Sulfamethoxazole-Trimethoprim] Rash    Diffuse drug reaction - maculopapular rash  . Penicillins Hives    Has patient had a PCN reaction causing immediate rash, facial/tongue/throat swelling, SOB or lightheadedness with hypotension: Unknown Has patient had a PCN reaction causing severe rash involving mucus membranes or skin necrosis: Unknown Has patient had a PCN reaction that required hospitalization: Unknown Has patient had a PCN reaction occurring within the last 10 years: Unknown If all of the above answers are "NO", then may proceed with Cephalosporin  use.   . Lasix [Furosemide] Other (See Comments)    Drops blood pressure and drained potassium and magnesium   VITALS:  Blood pressure (!) 113/57, pulse 82, temperature 97.6 F (36.4 C), temperature source Oral, resp. rate 15, height 5' 7.5" (1.715 m), weight 114.5 kg, SpO2 97 %. PHYSICAL EXAMINATION:  Physical Exam  Constitutional: He is oriented to person, place, and time. He appears well-developed and well-nourished.  HENT:  Head: Normocephalic and atraumatic.  Eyes: Pupils are equal, round, and reactive to light. Conjunctivae and EOM are normal.  Neck: Normal range of motion. Neck supple. No thyromegaly present.  Cardiovascular: Normal rate, regular rhythm and normal heart sounds.  Respiratory: Effort normal. No respiratory distress. He has no wheezes.  GI: Soft. Bowel sounds are normal. He exhibits no distension. There is no abdominal tenderness.  Musculoskeletal:        General: No edema.     Comments: Dressing in place to right knee joint status post recent knee surgery  Neurological: He is alert and oriented to person, place, and time.  Skin: Skin is warm. No erythema.  Psychiatric: He has a normal mood and affect. Thought content normal.    LABORATORY PANEL:  Male CBC Recent Labs  Lab 11/17/18 0500  WBC 6.7  HGB 8.1*  HCT 24.8*  PLT 195   ------------------------------------------------------------------------------------------------------------------ Chemistries  Recent Labs  Lab 11/15/18 1339  11/17/18 0500  NA 131*   < > 132*  K 4.3   < > 4.1  CL 102   < > 101  CO2 18*   < > 22  GLUCOSE 137*   < > 154*  BUN 39*   < > 29*  CREATININE 1.79*   < > 1.26*  CALCIUM 7.7*   < > 8.0*  MG  --    < > 2.0  AST 11*  --   --   ALT 20  --   --   ALKPHOS 53  --   --   BILITOT 0.6  --   --    < > = values in this interval not displayed.   RADIOLOGY:  No results found. ASSESSMENT AND PLAN:   1. Generalized weakness- Improved.  Physical therapy working with  patient. So far no evidence of infectious process found.  Influenza test done was negative.  TSH checked was normal.  Chest x-ray negative.  Urinalysis negative.   Physical therapy to continue working with patient.  Plans for discharge to skilled nursing facility for continued rehab  2. Urinary retention-could be from narcotics and constipation as well as decreased mobility due to recent surgery Noted greater than 953ml on bladder scan previously and Foley catheter was placed. Patient already seen by urologist.  Recommendation is to leave Foley catheter in place on discharge.  Follow-up with urologist as outpatient.  To be discharged on Flomax.  Continue physical therapy and rehab.  Constipation resolved.  Patient had a bowel movement . PSA was normal at 0.25  3.Hypertension Blood pressure improved and stable.  Resume home regimen on discharge  4. Hyperlipidemia continue statin  5Multiple bruises on the lower extremities No active bleeding noticed If bruises are consistent outpatient follow-up with hematology will be beneficial  5. Right total knee arthroplasty postop day 5 Postop care by orthopedics Continue physical therapy  6. Chronic lower extremity weakness following back surgery in June 2019 Patient is supposed to see neurology as an outpatient after discharge Denies any worsening of the lower extremity weakness Continue physical therapy  7.  Diabetes mellitus type 2 Metformin held during this admission due to recent decline in renal function. Renal function improved with GFR of 56 today.  Continue metformin on discharge  8.  Acute kidney injury Renal function improved with IV fluid hydration with creatinine down to 1.26 prior to discharge.  DVT prophylaxis;Lovenox   All the records are reviewed and case discussed with Care Management/Social Worker. Management plans discussed with the patient, and he is in agreement.  CODE STATUS: Full Code  TOTAL TIME TAKING CARE  OF THIS PATIENT: 34 minutes.   More than 50% of the time was spent in counseling/coordination of care: YES  Discharge plans per orthopedic service.   Crespin Forstrom M.D on 11/17/2018 at 11:35 AM  Between 7am to 6pm - Pager - [phone]  After 6pm go to [email or web address] - Proofreader  [facility]  Office  [phone]  CC: Primary care physician; Ria Bush, MD  Note: This dictation was prepared with Dragon dictation along with smaller phrase technology. Any transcriptional errors that result from this process are unintentional.

## 2018-11-17 NOTE — Discharge Summary (Addendum)
Physician Discharge Summary  Patient ID: Thomas Carlson MRN: [id number] DOB/AGE: 74/16/1947 74 y.o.  Admit date: 11/12/2018 Discharge date: 11/17/2018  Admission Diagnoses:  OSTEOARTHRITIS OF RIGHT KNEE JOINT <principal problem not specified>  Discharge Diagnoses:  OSTEOARTHRITIS OF RIGHT KNEE JOINT Active Problems:   Total knee replacement status, right   Past Medical History:  Diagnosis Date  . Acute posthemorrhagic anemia   . Aortic valve sclerosis 08/13/2016   Sclerosis without stenosis by [place] (08/2016)  . CAD (coronary artery disease) 2005   s/p stent ([place])  . Cholelithiasis    by CT  . Chronic kidney disease, stage 3, mod decreased GFR (HCC)   . Diabetes mellitus ([place]) 2010  . Diverticulosis    by CT  . DJD (degenerative joint disease)    knee  . Genital herpes   . Gout   . Heart murmur    followed by PCP  . HLD (hyperlipidemia)   . HTN (hypertension)   . Knee pain, right    "needs replacement"  . Neuropathy    feet  . Obesity   . Seizures ([place])    x1 - after craniotomy (1960s)  . Weakness of both legs    "since back surgery"    Surgeries: Procedure(s): TOTAL KNEE ARTHROPLASTY on 11/12/2018   Consultants (if any):  Hospitalist  Discharged Condition: Improved  Hospital Course: CASSON CATENA is an 73 y.o. male who was admitted 11/12/2018 with a diagnosis of  OSTEOARTHRITIS OF RIGHT KNEE JOINTand went to the operating room on 11/12/2018 and underwent the above named procedures.    He was given perioperative antibiotics:  Anti-infectives (From admission, onward)   Start     Dose/Rate Route Frequency Ordered Stop   11/12/18 1930  vancomycin (VANCOCIN) IVPB 1000 mg/200 mL premix     1,000 mg 200 mL/hr over 60 Minutes Intravenous Every 12 hours 11/12/18 1234 11/12/18 2057   11/12/18 1630  clindamycin (CLEOCIN) IVPB 900 mg     900 mg 100 mL/hr over 30 Minutes Intravenous Every 8 hours 11/12/18 1615 11/12/18 2229   11/12/18 1400  acyclovir (ZOVIRAX) 200  MG capsule 400 mg     400 mg Oral 2 times daily 11/12/18 1234     11/12/18 1008  gentamicin (GARAMYCIN) 80 mg in sodium chloride 0.9 % 500 mL irrigation  Status:  Discontinued       As needed 11/12/18 1009 11/12/18 1058   11/12/18 1007  gentamicin (GARAMYCIN) 80 mg in sodium chloride 0.9 % 500 mL irrigation  Status:  Discontinued       As needed 11/12/18 1008 11/12/18 1058   11/12/18 0600  vancomycin (VANCOCIN) 1,500 mg in sodium chloride 0.9 % 500 mL IVPB     1,500 mg 250 mL/hr over 120 Minutes Intravenous On call to O.R. 11/11/18 2238 11/12/18 0904   11/12/18 0559  clindamycin (CLEOCIN) 900 MG/50ML IVPB    Note to Pharmacy:  Beulah Gandy   : cabinet override      11/12/18 0559 11/12/18 0817   11/12/18 0559  vancomycin (VANCOCIN) 1-5 GM/200ML-% IVPB  Status:  Discontinued    Note to Pharmacy:  Beulah Gandy   : cabinet override      11/12/18 0559 11/12/18 0618   11/11/18 2245  clindamycin (CLEOCIN) IVPB 900 mg     900 mg 100 mL/hr over 30 Minutes Intravenous  Once 11/11/18 2238 11/12/18 0837    .  He was given sequential compression devices, early ambulation, and lovenox initially and  later aspirin for DVT prophylaxis.  Patient was slow to progress post-op.  He had orthostatic hypotension post-op which improved with IV fluids.   He also had urinary retention and needed a foley catheter replaced on POD #3.  He was started on flomax.  An infection workup was started for patient fatigue/weakness which was negative.  Given the patient's improvement on POD #5 he was discharged to [facility].  He benefited maximally from the hospital stay and there were no complications.    Recent vital signs:  Vitals:   11/16/18 2245 11/17/18 0743  BP: (!) 139/56 (!) 113/57  Pulse: 85 82  Resp: 15   Temp: 98 F (36.7 C) 97.6 F (36.4 C)  SpO2: 97% 97%    Recent laboratory studies:  Lab Results  Component Value Date   HGB 8.1 (L) 11/17/2018   HGB 8.4 (L) 11/16/2018   HGB 9.3 (L)  11/15/2018   Lab Results  Component Value Date   WBC 6.7 11/17/2018   PLT 195 11/17/2018   Lab Results  Component Value Date   INR 0.97 10/29/2018   Lab Results  Component Value Date   NA 132 (L) 11/17/2018   K 4.1 11/17/2018   CL 101 11/17/2018   CO2 22 11/17/2018   BUN 29 (H) 11/17/2018   CREATININE 1.26 (H) 11/17/2018   GLUCOSE 154 (H) 11/17/2018    Discharge Medications:   Allergies as of 11/17/2018      Reactions   Gabapentin Other (See Comments)   unknown   Bactrim [sulfamethoxazole-trimethoprim] Rash   Diffuse drug reaction - maculopapular rash   Penicillins Hives   Has patient had a PCN reaction causing immediate rash, facial/tongue/throat swelling, SOB or lightheadedness with hypotension: Unknown Has patient had a PCN reaction causing severe rash involving mucus membranes or skin necrosis: Unknown Has patient had a PCN reaction that required hospitalization: Unknown Has patient had a PCN reaction occurring within the last 10 years: Unknown If all of the above answers are "NO", then may proceed with Cephalosporin use.   Lasix [furosemide] Other (See Comments)   Drops blood pressure and drained potassium and magnesium      Medication List    STOP taking these medications   meloxicam 7.5 MG tablet Commonly known as:  MOBIC     TAKE these medications   acetaminophen 325 MG tablet Commonly known as:  TYLENOL Take 2 tablets (650 mg total) by mouth every 6 (six) hours as needed for mild pain (pain score 1-3 or temp > 100.5).   acyclovir 400 MG tablet Commonly known as:  ZOVIRAX Take 1 tablet (400 mg total) by mouth 2 (two) times daily.   allopurinol 100 MG tablet Commonly known as:  ZYLOPRIM Take 1 tablet (100 mg total) by mouth daily.   aspirin 325 MG EC tablet Take 1 tablet (325 mg total) by mouth 2 (two) times daily. What changed:    medication strength  how much to take  when to take this   atenolol-chlorthalidone 50-25 MG tablet Commonly  known as:  TENORETIC Take 1 tablet by mouth daily.   b complex vitamins tablet Take 1 tablet by mouth daily.   Biotin 1000 MCG tablet Take 1,000 mcg by mouth daily.   bisacodyl 10 MG suppository Commonly known as:  DULCOLAX Place 1 suppository (10 mg total) rectally daily as needed for moderate constipation.   budesonide 3 MG 24 hr capsule Commonly known as:  ENTOCORT EC Take 3 capsules at breakfast daily  What changed:    how much to take  how to take this  when to take this  reasons to take this  additional instructions   docusate sodium 100 MG capsule Commonly known as:  COLACE Take 1 capsule (100 mg total) by mouth 2 (two) times daily.       lovastatin 40 MG tablet Commonly known as:  MEVACOR Take 2 tablets (80 mg total) by mouth at bedtime.   Magnesium 500 MG Tabs Take 500 mg by mouth daily.   metFORMIN 500 MG tablet Commonly known as:  GLUCOPHAGE Take 1 tablet (500 mg total) by mouth 3 (three) times daily before meals.   multivitamin tablet Take 1 tablet by mouth daily.   oxyCODONE 5 MG immediate release tablet Commonly known as:  Oxy IR/ROXICODONE Take 1-2 tablets (5-10 mg total) by mouth every 4 (four) hours as needed for moderate pain or severe pain (pain score 4-6).   pantoprazole 40 MG tablet Commonly known as:  PROTONIX Take 1 tablet (40 mg total) by mouth daily.   Potassium Chloride ER 20 MEQ Tbcr Take 20 mEq by mouth 2 (two) times daily.   quinapril 20 MG tablet Commonly known as:  ACCUPRIL Take 1 tablet (20 mg total) by mouth at bedtime.   tamsulosin 0.4 MG Caps capsule Commonly known as:  FLOMAX Take 1 capsule (0.4 mg total) by mouth at bedtime.   traMADol 50 MG tablet Commonly known as:  ULTRAM Take 1 tablet (50 mg total) by mouth every 6 (six) hours as needed for moderate pain.   vitamin C 500 MG tablet Commonly known as:  ASCORBIC ACID Take 500 mg by mouth daily.   Vitamin D 50 MCG (2000 UT) Caps Take 2,000 Units by mouth  daily.       Diagnostic Studies: Dg Chest 2 View  Result Date: 11/15/2018 CLINICAL DATA:  Shortness of breath. Status post knee replacement 3 days ago. EXAM: CHEST - 2 VIEW COMPARISON:  October 29, 2018 FINDINGS: The heart size and mediastinal contours are within normal limits. Both lungs are clear. The visualized skeletal structures are unremarkable. IMPRESSION: No active cardiopulmonary disease. Electronically Signed   By: Dorise Bullion III M.D   On: 11/15/2018 14:43   Chest 2 View  Result Date: 10/29/2018 CLINICAL DATA:  Pre op clearance, knee surgery Jan. 16thHx CAD, HTN, stage 3 CKDNever a smoker EXAM: CHEST - 2 VIEW COMPARISON:  08/23/2018 FINDINGS: Cardiac silhouette is top-normal in size. No mediastinal or hilar masses. No evidence of adenopathy. Clear lungs.  No pleural effusion or pneumothorax. Skeletal structures are intact. IMPRESSION: No active cardiopulmonary disease. Electronically Signed   By: Lajean Manes M.D.   On: 10/29/2018 12:57   Dg Knee Right Port  Result Date: 11/12/2018 CLINICAL DATA:  Status post right knee replacement today. EXAM: PORTABLE RIGHT KNEE - 1-2 VIEW COMPARISON:  Plain films right knee 02/13/2018. FINDINGS: New total arthroplasty is in place. The device is located. No fracture. Gas in the soft tissues and surgical staples noted. IMPRESSION: Status post right knee replacement.  No acute finding. Electronically Signed   By: Inge Rise M.D.   On: 11/12/2018 11:43    Disposition: Discharge disposition: 03-Skilled Nursing Facility       Discharge Instructions    Call MD / Call 911   Complete by:  As directed    If you experience chest pain or shortness of breath, CALL 911 and be transported to the hospital emergency room.  If you  develope a fever above 101 F, pus (white drainage) or increased drainage or redness at the wound, or calf pain, call your surgeon's office.   Constipation Prevention   Complete by:  As directed    Drink plenty of fluids.   Prune juice may be helpful.  You may use a stool softener, such as Colace (over the counter) 100 mg twice a day.  Use MiraLax (over the counter) for constipation as needed.   Diet general   Complete by:  As directed    Discharge instructions   Complete by:  As directed    Continue WBAT on the right lower extremity. Continue to use TED stockings until follow-up. Patient may remove them at night for sleep. Elevate the right lower extremity whenever possible. Continue to use knee immobilizer at night or when lying in bed or when elevating the operative leg. The patient may remove the knee immobilizer to perform exercises or sit in a chair. Continue using the Polar Care for comfort. Keep incision clean and dry. Cover the right knee incision during showers with a plastic bag or Saran wrap. Take aspirin 325mg  PO twice a day for blood clot prevention. Continue to work on knee range of motion exercises at home as instructed by physical therapy. Continue to use a walker for assistance with ambulation until follow-up.  Follow up with Dr. Mack Guise in 10-14 days.   Driving restrictions   Complete by:  As directed    No driving for 8 weeks   Increase activity slowly as tolerated   Complete by:  As directed    Lifting restrictions   Complete by:  As directed    No lifting for 12 weeks       Contact information for follow-up providers    Abbie Sons, MD On 11/23/2018.   Specialty:  Urology Why:  @8am  follow up  Contact information: [place] [address] [phone]  Thornton Park, MD 1st post-op clinic visit on 11/23/18  @ 11:30 [place] [place], [place]          Contact information for after-discharge care    Destination    [facility] .   Service:  Skilled Nursing Contact information: [address] [place] [phone]                    Signed: Thornton Park ,MD 11/17/2018, 11:56 AM

## 2018-11-17 NOTE — Progress Notes (Signed)
Patient is medically stable for D/C to [facility] today. Per [facility] admissions coordinator at [facility] authorization has been received and patient can come today to room 218. RN will call report at [phone] and arrange EMS for transport. Clinical Education officer, museum (CSW) sent D/C orders to [employer] via [employer]. Patient is aware of above. CSW left patient's wife [name] a Advertising account executive. Please reconsult if future social work needs arise. CSW signing off.   [employer], LCSW [phone]

## 2018-11-17 NOTE — Progress Notes (Signed)
Physical Therapy Treatment Patient Details Name: Thomas Carlson MRN: [id number] DOB: 24-Sep-1946 Today's Date: 11/17/2018    History of Present Illness Patient is a 73 year old male admitted to [facility] for a TKA.  PMHx includes seizures, neuropathy, Htn, HLD, heart murmur, gout, DM, DJD, CKD 3 and CAD.    PT Comments    Thomas Carlson was able to ambulate 10 ft today with multiple rest breaks. Pt limited by SOB, weakness, and pain. + orthostatic values in supine 137/68, sitting 105/56, standing 102/59 per PT judgement continued with session. Pt stated he had 5/10 dizziness upon standing but did not increase throughout session. One standing rest break was required while ambulating. Following rest pt was able to ambulate 4 ft more feet. RN notified of mobility status and +orthostatics. Previous recommendation remain appropriate with possible pt discharge today.    Follow Up Recommendations  SNF     Equipment Recommendations  Rolling walker with 5" wheels    Recommendations for Other Services       Precautions / Restrictions Precautions Precautions: Knee;Fall Precaution Booklet Issued: No Required Braces or Orthoses: Knee Immobilizer - Right Knee Immobilizer - Right: Other (comment) Restrictions Weight Bearing Restrictions: Yes RLE Weight Bearing: Weight bearing as tolerated    Mobility  Bed Mobility Overal bed mobility: Needs Assistance Bed Mobility: Supine to Sit     Supine to sit: HOB elevated;Min guard     General bed mobility comments: Pt able to move from supine to EOB without physical assistance. +2 CGA due to previous +orthostatics.  Transfers Overall transfer level: Needs assistance Equipment used: Rolling walker (2 wheeled) Transfers: Sit to/from Stand Sit to Stand: Min assist;+2 physical assistance;Mod assist(Min A +2 sit to stand, Mod A +2 stand to sit. )         General transfer comment: Pt reported 5/10 dizziness upon standing. After ~30 second rest break pt  reported that dizziness slightly decreased.  Ambulation/Gait Ambulation/Gait assistance: Min guard;+2 safety/equipment Gait Distance (Feet): 10 Feet Assistive device: Rolling walker (2 wheeled) Gait Pattern/deviations: Trunk flexed;Decreased stance time - right;Decreased step length - left;Decreased step length - right Gait velocity: decreased  Gait velocity interpretation: <1.8 ft/sec, indicate of risk for recurrent falls General Gait Details: Pt was able to ambulate 10 ft with intermittent rest breaks. Pt requested to sit due to SOB but was instructed to stand and regain breath. With rest break pt was able to continue ambulating. VC required throughout gait for safe RW management and to maintain an upright posture.   Stairs             Wheelchair Mobility    Modified Rankin (Stroke Patients Only)       Balance Overall balance assessment: Needs assistance Sitting-balance support: Bilateral upper extremity supported;Feet supported Sitting balance-Leahy Scale: Poor Sitting balance - Comments: Pt required at least 2 UE support to remain steady at EOB.    Standing balance support: Bilateral upper extremity supported;During functional activity Standing balance-Leahy Scale: Poor Standing balance comment: Pt required bilateral UE support to maintain balance.                             Cognition Arousal/Alertness: Awake/alert Behavior During Therapy: WFL for tasks assessed/performed Overall Cognitive Status: Within Functional Limits for tasks assessed  General Comments: Multiple VC for safe management of RW      Exercises Total Joint Exercises Goniometric ROM: -4-45 General Exercises - Lower Extremity Ankle Circles/Pumps: AROM;Both;20 reps;Supine Quad Sets: AROM;10 reps;Strengthening;Supine Heel Slides: AROM;Right;10 reps;Supine;Strengthening    General Comments General comments (skin integrity, edema, etc.): Pt  was able to ambulate 10 ft this session. BP taken in supine 137/68, in sitting 105/ 56, in standing 102/59. Pt +orthostatic and per PT judgement activity was continued. Pt report 5/10 dizziness from sit>stand that did not increase throughout session. HR did not get above 116 and SpO2 did not drop below 93% during treatment. While ambulating pt requested to sit due to being SOB and weak but was instructed to take a standing rest break. Following standing rest break pt was able to walk 4 ft.       Pertinent Vitals/Pain Pain Assessment: 0-10 Pain Score: 5  Pain Location: R knee Pain Descriptors / Indicators: Aching;Discomfort;Grimacing;Guarding;Tiring Pain Intervention(s): Monitored during session;Ice applied;Utilized relaxation techniques    Home Living                      Prior Function            PT Goals (current goals can now be found in the care plan section) Acute Rehab PT Goals Patient Stated Goal: To return home PT Goal Formulation: With patient Time For Goal Achievement: 11/26/18 Potential to Achieve Goals: Fair Progress towards PT goals: Progressing toward goals    Frequency    BID      PT Plan Current plan remains appropriate    Co-evaluation              AM-PAC PT "6 Clicks" Mobility   Outcome Measure  Help needed turning from your back to your side while in a flat bed without using bedrails?: A Little Help needed moving from lying on your back to sitting on the side of a flat bed without using bedrails?: A Little Help needed moving to and from a bed to a chair (including a wheelchair)?: A Lot Help needed standing up from a chair using your arms (e.g., wheelchair or bedside chair)?: A Lot Help needed to walk in hospital room?: A Lot Help needed climbing 3-5 steps with a railing? : Total 6 Click Score: 13    End of Session Equipment Utilized During Treatment: Gait belt;Right knee immobilizer Activity Tolerance: Patient limited by  fatigue Patient left: in chair;with call bell/phone within reach;with chair alarm set;with family/visitor present;Other (comment)(polar care and towel roll ) Nurse Communication: Mobility status;Other (comment)(+orthostatics) PT Visit Diagnosis: Unsteadiness on feet (R26.81);Muscle weakness (generalized) (M62.81);Pain Pain - Right/Left: Right Pain - part of body: Knee     Time: 3662-9476 PT Time Calculation (min) (ACUTE ONLY): 38 min  Charges:  $Gait Training: 8-22 mins $Therapeutic Exercise: 8-22 mins $Therapeutic Activity: 8-22 mins                     Dorothy Spark, SPT   11/17/2018, 1:23 PM

## 2018-11-17 NOTE — Progress Notes (Signed)
Subjective:  POD #5 s/p right TKA.  Patient reports right knee pain as mild.  Patient OOB to chair.  His wife is at the bedside.    Objective:   VITALS:   Vitals:   11/16/18 0901 11/16/18 1643 11/16/18 2245 11/17/18 0743  BP: 119/60 (!) 134/59 (!) 139/56 (!) 113/57  Pulse:  83 85 82  Resp:   15   Temp:  97.6 F (36.4 C) 98 F (36.7 C) 97.6 F (36.4 C)  TempSrc:  Oral Oral Oral  SpO2:  98% 97% 97%  Weight:      Height:        PHYSICAL EXAM: Right knee: I personally changed the patient's dressing today. There is no active drainage.  Patient has moderate swelling in the right lower leg but his compartments are soft and compressible.  Patient has ecchymosis in all 4 extremities.  He remains neurovascular intact.  There is no calf tenderness.   LABS  Results for orders placed or performed during the hospital encounter of 11/12/18 (from the past 24 hour(s))  Glucose, capillary     Status: Abnormal   Collection Time: 11/16/18 11:48 AM  Result Value Ref Range   Glucose-Capillary 119 (H) 70 - 99 mg/dL   Comment 1 Notify RN   Glucose, capillary     Status: Abnormal   Collection Time: 11/16/18  4:44 PM  Result Value Ref Range   Glucose-Capillary 120 (H) 70 - 99 mg/dL   Comment 1 Notify RN   Glucose, capillary     Status: Abnormal   Collection Time: 11/16/18  9:01 PM  Result Value Ref Range   Glucose-Capillary 166 (H) 70 - 99 mg/dL  Basic metabolic panel     Status: Abnormal   Collection Time: 11/17/18  5:00 AM  Result Value Ref Range   Sodium 132 (L) 135 - 145 mmol/L   Potassium 4.1 3.5 - 5.1 mmol/L   Chloride 101 98 - 111 mmol/L   CO2 22 22 - 32 mmol/L   Glucose, Bld 154 (H) 70 - 99 mg/dL   BUN 29 (H) 8 - 23 mg/dL   Creatinine, Ser 1.26 (H) 0.61 - 1.24 mg/dL   Calcium 8.0 (L) 8.9 - 10.3 mg/dL   GFR calc non Af Amer 56 (L) >60 mL/min   GFR calc Af Amer >60 >60 mL/min   Anion gap 9 5 - 15  CBC     Status: Abnormal   Collection Time: 11/17/18  5:00 AM  Result Value  Ref Range   WBC 6.7 4.0 - 10.5 K/uL   RBC 2.55 (L) 4.22 - 5.81 MIL/uL   Hemoglobin 8.1 (L) 13.0 - 17.0 g/dL   HCT 24.8 (L) 39.0 - 52.0 %   MCV 97.3 80.0 - 100.0 fL   MCH 31.8 26.0 - 34.0 pg   MCHC 32.7 30.0 - 36.0 g/dL   RDW 13.6 11.5 - 15.5 %   Platelets 195 150 - 400 K/uL   nRBC 0.0 0.0 - 0.2 %  Magnesium     Status: None   Collection Time: 11/17/18  5:00 AM  Result Value Ref Range   Magnesium 2.0 1.7 - 2.4 mg/dL  Glucose, capillary     Status: Abnormal   Collection Time: 11/17/18  7:46 AM  Result Value Ref Range   Glucose-Capillary 160 (H) 70 - 99 mg/dL   Comment 1 Notify RN     Dg Chest 2 View  Result Date: 11/15/2018 CLINICAL DATA:  Shortness of breath.  Status post knee replacement 3 days ago. EXAM: CHEST - 2 VIEW COMPARISON:  October 29, 2018 FINDINGS: The heart size and mediastinal contours are within normal limits. Both lungs are clear. The visualized skeletal structures are unremarkable. IMPRESSION: No active cardiopulmonary disease. Electronically Signed   By: Dorise Bullion III M.D   On: 11/15/2018 14:43    Assessment/Plan: 5 Days Post-Op   Active Problems:   Total knee replacement status, right  Patient is stable postop.  He was able to participate more with physical therapy today.  He had no orthostatic hypotension when standing.  Given the patient's clinical improvement he is prepared for discharge to rehab.  He will follow-up with me in approximately 10 to 14 days.  He will follow-up with Dr. Erlene Quan in neurology in 1 week for Foley catheter removal.  Will remain on enteric-coated aspirin 325 mg p.o. twice daily for DVT prophylaxis.  Lovenox was stopped due to patient's diffuse ecchymosis.    Thornton Park , MD 11/17/2018, 11:42 AM

## 2018-11-17 NOTE — Clinical Social Work Placement (Signed)
   CLINICAL SOCIAL WORK PLACEMENT  NOTE  Date:  11/17/2018  Patient Details  Name: Thomas Carlson MRN: [id number] Date of Birth: 1946/09/30  Clinical Social Work is seeking post-discharge placement for this patient at the [place] level of care (*CSW will initial, date and re-position this form in  chart as items are completed):  Yes   Patient/family provided with [facility] Work Department's list of facilities offering this level of care within the geographic area requested by the patient (or if unable, by the patient's family).  Yes   Patient/family informed of their freedom to choose among providers that offer the needed level of care, that participate in Medicare, Medicaid or managed care program needed by the patient, have an available bed and are willing to accept the patient.  Yes   Patient/family informed of [place]'s ownership interest in [facility] and [facility], as well as of the fact that they are under no obligation to receive care at these facilities.  PASRR submitted to EDS on 11/12/18     PASRR number received on 11/12/18     Existing PASRR number confirmed on       FL2 transmitted to all facilities in geographic area requested by pt/family on 11/13/18     FL2 transmitted to all facilities within larger geographic area on       Patient informed that his/her managed care company has contracts with or will negotiate with certain facilities, including the following:        Yes   Patient/family informed of bed offers received.  Patient chooses bed at [facility] )     Physician recommends and patient chooses bed at      Patient to be transferred to [facility] ) on 11/17/18.  Patient to be transferred to facility by [facility] )     Patient family notified on 11/17/18 of transfer.  Name of family member notified:  ([place] left patient's wife [name] a Advertising account executive. )     PHYSICIAN       Additional  Comment:    _______________________________________________ Renny Gunnarson, Veronia Beets, LCSW 11/17/2018, 1:37 PM

## 2018-11-17 NOTE — Progress Notes (Signed)
Report called and given to [facility] at [place]. EMS called for transport. IV removed. Assisted pt to get dressed and will await transport.

## 2018-11-18 ENCOUNTER — Ambulatory Visit: Payer: Medicare HMO

## 2018-11-18 ENCOUNTER — Encounter: Payer: Self-pay | Admitting: Adult Health

## 2018-11-18 ENCOUNTER — Non-Acute Institutional Stay (SKILLED_NURSING_FACILITY): Payer: Medicare HMO | Admitting: Adult Health

## 2018-11-18 DIAGNOSIS — K219 Gastro-esophageal reflux disease without esophagitis: Secondary | ICD-10-CM | POA: Diagnosis not present

## 2018-11-18 DIAGNOSIS — E785 Hyperlipidemia, unspecified: Secondary | ICD-10-CM

## 2018-11-18 DIAGNOSIS — G4733 Obstructive sleep apnea (adult) (pediatric): Secondary | ICD-10-CM | POA: Diagnosis not present

## 2018-11-18 DIAGNOSIS — A6002 Herpesviral infection of other male genital organs: Secondary | ICD-10-CM | POA: Diagnosis not present

## 2018-11-18 DIAGNOSIS — E119 Type 2 diabetes mellitus without complications: Secondary | ICD-10-CM | POA: Diagnosis not present

## 2018-11-18 DIAGNOSIS — M1A29X0 Drug-induced chronic gout, multiple sites, without tophus (tophi): Secondary | ICD-10-CM | POA: Diagnosis not present

## 2018-11-18 DIAGNOSIS — E1169 Type 2 diabetes mellitus with other specified complication: Secondary | ICD-10-CM | POA: Diagnosis not present

## 2018-11-18 DIAGNOSIS — N400 Enlarged prostate without lower urinary tract symptoms: Secondary | ICD-10-CM

## 2018-11-18 DIAGNOSIS — N183 Chronic kidney disease, stage 3 (moderate): Secondary | ICD-10-CM | POA: Diagnosis not present

## 2018-11-18 DIAGNOSIS — E1159 Type 2 diabetes mellitus with other circulatory complications: Secondary | ICD-10-CM

## 2018-11-18 DIAGNOSIS — K52832 Lymphocytic colitis: Secondary | ICD-10-CM

## 2018-11-18 DIAGNOSIS — E1122 Type 2 diabetes mellitus with diabetic chronic kidney disease: Secondary | ICD-10-CM | POA: Diagnosis not present

## 2018-11-18 DIAGNOSIS — I10 Essential (primary) hypertension: Secondary | ICD-10-CM

## 2018-11-18 DIAGNOSIS — M1711 Unilateral primary osteoarthritis, right knee: Secondary | ICD-10-CM

## 2018-11-18 DIAGNOSIS — I25118 Atherosclerotic heart disease of native coronary artery with other forms of angina pectoris: Secondary | ICD-10-CM | POA: Diagnosis not present

## 2018-11-18 NOTE — Progress Notes (Signed)
[facility]:   [facility] Room Number: 218 A Place of Service:  SNF (31)   CODE STATUS: Full Code  Allergies  Allergen Reactions  . Gabapentin Other (See Comments)    unknown  . Bactrim [Sulfamethoxazole-Trimethoprim] Rash    Diffuse drug reaction - maculopapular rash  . Penicillins Hives    Has patient had a PCN reaction causing immediate rash, facial/tongue/throat swelling, SOB or lightheadedness with hypotension: Unknown Has patient had a PCN reaction causing severe rash involving mucus membranes or skin necrosis: Unknown Has patient had a PCN reaction that required hospitalization: Unknown Has patient had a PCN reaction occurring within the last 10 years: Unknown If all of the above answers are "NO", then may proceed with Cephalosporin use.   . Lasix [Furosemide] Other (See Comments)    Drops blood pressure and drained potassium and magnesium    Chief Complaint  Patient presents with  . Hospitalization Follow-up    Hospital Follow up    HPI:  He is a 73 year old man who was hospitalized for a right knee replacement. He is here for short term rehab with his goal to return back home. He does have right knee pain which is not being adequately managed. He is somewhat lethargic today. There are reports of changes in appetite; he denies any constipation. He will continue to be followed for his chronic illnesses including: hypertension; colitis; dyslipidemia.   Past Medical History:  Diagnosis Date  . Acute posthemorrhagic anemia   . Aortic valve sclerosis 08/13/2016   Sclerosis without stenosis by [place] (08/2016)  . CAD (coronary artery disease) 2005   s/p stent (Fath)  . Cholelithiasis    by CT  . Chronic kidney disease, stage 3, mod decreased GFR (HCC)   . Diabetes mellitus ([place]) 2010  . Diverticulosis    by CT  . DJD (degenerative joint disease)    knee  . Genital herpes   . Gout   . Heart murmur    followed by PCP  . HLD (hyperlipidemia)     . HTN (hypertension)   . Knee pain, right    "needs replacement"  . Neuropathy    feet  . Obesity   . Seizures ([place])    x1 - after craniotomy (1960s)  . Weakness of both legs    "since back surgery"    Past Surgical History:  Procedure Laterality Date  . APPENDECTOMY  1958  . CLOSED REDUCTION CLAVICLE FRACTURE  1955  . COLONOSCOPY  02/2008   int hemorrhoids, diverticula (Dr. Allen Norris)  . COLONOSCOPY N/A 08/26/2018   TA, rpt 3 yrs (Vanga, [place])  . COLONOSCOPY WITH PROPOFOL N/A 11/10/2015   diverticulosis, path with microscopic colitis [name], MD)  . CORONARY ANGIOPLASTY WITH STENT PLACEMENT  2005   stent 2005  . DECOMPRESSIVE LUMBAR LAMINECTOMY LEVEL 1  03/2018   herniated L4/5 disc R with free fargment over L4 s/p surgery L4 (Krasinksi)  . ESOPHAGOGASTRODUODENOSCOPY N/A 08/26/2018   reactive gastritis with benign biopsies (Vanga, Rohini Reddy)  . HERNIA REPAIR  1956   inguinal  . KNEE ARTHROSCOPY  2008   torn meniscus  . SUBDURAL HEMATOMA EVACUATION VIA CRANIOTOMY  1964   hit in helmet by baseball  . TOTAL KNEE ARTHROPLASTY Right 11/12/2018   Procedure: TOTAL KNEE ARTHROPLASTY;  Surgeon: Thornton Park, MD;  Location: [facility] ORS;  Service: Orthopedics;  Laterality: Right;    Social History   Socioeconomic History  . Marital status: Married  Spouse name: Not on file  . Number of children: Not on file  . Years of education: Not on file  . Highest education level: Not on file  Occupational History  . Not on file  Social Needs  . Financial resource strain: Not hard at all  . Food insecurity:    Worry: Never true    Inability: Never true  . Transportation needs:    Medical: No    Non-medical: No  Tobacco Use  . Smoking status: Never Smoker  . Smokeless tobacco: Never Used  Substance and Sexual Activity  . Alcohol use: No    Alcohol/week: 0.0 standard drinks  . Drug use: No  . Sexual activity: Not Currently  Lifestyle  . Physical activity:     Days per week: 0 days    Minutes per session: Not on file  . Stress: To some extent  Relationships  . Social connections:    Talks on phone: Twice a week    Gets together: Twice a week    Attends religious service: 1 to 4 times per year    Active member of club or organization: No    Attends meetings of clubs or organizations: Not on file    Relationship status: Married  . Intimate partner violence:    Fear of current or ex partner: Patient refused    Emotionally abused: Patient refused    Physically abused: Patient refused    Forced sexual activity: Patient refused  Other Topics Concern  . Not on file  Social History Narrative   Caffeine: 2 cans of diet soda/day   Lives with wife.  1 dog at home.  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 Diabetes Father 33  . Coronary artery disease Sister        catheterizations  . COPD Brother   . Stroke Brother   . Hypertension Brother   . Cancer Neg Hx       VITAL SIGNS BP (!) 108/52   Pulse 85   Temp 98.1 F (36.7 C)   Resp 18   Ht 5' 7.5" (1.715 m)   Wt 253 lb 14.4 oz (115.2 kg)   SpO2 96%   BMI 39.18 kg/m   Outpatient Encounter Medications as of 11/18/2018  Medication Sig  . acetaminophen (TYLENOL) 325 MG tablet Take 2 tablets (650 mg total) by mouth every 6 (six) hours as needed for mild pain (pain score 1-3 or temp > 100.5).  Marland Kitchen acyclovir (ZOVIRAX) 400 MG tablet Take 1 tablet (400 mg total) by mouth 2 (two) times daily.  Marland Kitchen allopurinol (ZYLOPRIM) 100 MG tablet Take 1 tablet (100 mg total) by mouth daily.  Marland Kitchen aspirin EC 325 MG EC tablet Take 1 tablet (325 mg total) by mouth 2 (two) times daily.  Marland Kitchen atenolol-chlorthalidone (TENORETIC) 50-25 MG tablet Take 1 tablet by mouth daily.  Marland Kitchen b complex vitamins tablet Take 1  tablet by mouth daily.  . Biotin 1000 MCG tablet Take 1,000 mcg by mouth daily.   . bisacodyl (DULCOLAX) 10 MG suppository Place 1 suppository (10 mg total) rectally daily as needed for moderate constipation.  . budesonide (ENTOCORT EC) 3 MG 24 hr capsule Take 9 mg by mouth daily.  . cholecalciferol (VITAMIN D3) 25 MCG (1000 UT) tablet  Take 1,000 Units by mouth daily.   Marland Kitchen docusate sodium (COLACE) 100 MG capsule Take 1 capsule (100 mg total) by mouth 2 (two) times daily.  Marland Kitchen lovastatin (MEVACOR) 40 MG tablet Take 2 tablets (80 mg total) by mouth at bedtime.  . Magnesium 500 MG TABS Take 500 mg by mouth daily.  . metFORMIN (GLUCOPHAGE) 500 MG tablet Take 1 tablet (500 mg total) by mouth 3 (three) times daily before meals.  . Multiple Vitamin (MULTIVITAMIN) tablet Take 1 tablet by mouth daily.  . NON FORMULARY Diet Type:  Regular  . oxyCODONE (OXY IR/ROXICODONE) 5 MG immediate release tablet Take 1-2 tablets (5-10 mg total) by mouth every 4 (four) hours as needed for moderate pain or severe pain (pain score 4-6).  . pantoprazole (PROTONIX) 40 MG tablet Take 1 tablet (40 mg total) by mouth daily.  . Potassium Chloride ER 20 MEQ TBCR Take 20 mEq by mouth 2 (two) times daily.  . quinapril (ACCUPRIL) 20 MG tablet Take 1 tablet (20 mg total) by mouth at bedtime.  . tamsulosin (FLOMAX) 0.4 MG CAPS capsule Take 1 capsule (0.4 mg total) by mouth at bedtime.  . traMADol (ULTRAM) 50 MG tablet Take 1 tablet (50 mg total) by mouth every 6 (six) hours as needed for moderate pain.  . vitamin C (ASCORBIC ACID) 500 MG tablet Take 500 mg by mouth daily.  . [DISCONTINUED] budesonide (ENTOCORT EC) 3 MG 24 hr capsule Take 3 capsules at breakfast daily (Patient not taking: Reported on 11/18/2018)   No facility-administered encounter medications on file as of 11/18/2018.      SIGNIFICANT DIAGNOSTIC EXAMS  LABS REVIEWED TODAY:   11-17-18: wbc 6.7; hgb 8.1; hct 24.8; mcv 97.3; plt 195; glucose 154; bun 29; creat 1.26;  k+ 4.1; na++ 132; ca 8.0 mag 2.0    Review of Systems  Constitutional: Negative for malaise/fatigue.  Respiratory: Negative for cough and shortness of breath.   Cardiovascular: Negative for chest pain, palpitations and leg swelling.  Gastrointestinal: Negative for abdominal pain, constipation and heartburn.  Musculoskeletal: Positive for joint pain. Negative for back pain and myalgias.       Right knee pain   Skin: Negative.   Neurological: Negative for dizziness.  Psychiatric/Behavioral: The patient has insomnia. The patient is not nervous/anxious.    Physical Exam Constitutional:      General: He is not in acute distress.    Appearance: He is well-developed. He is not diaphoretic.  Neck:     Musculoskeletal: Neck supple.     Thyroid: No thyromegaly.  Cardiovascular:     Rate and Rhythm: Normal rate and regular rhythm.     Pulses: Normal pulses.     Heart sounds: Normal heart sounds.  Pulmonary:     Effort: Pulmonary effort is normal. No respiratory distress.     Breath sounds: Normal breath sounds.  Abdominal:     General: Bowel sounds are normal. There is no distension.     Palpations: Abdomen is soft.     Tenderness: There is no abdominal tenderness.  Musculoskeletal:     Right lower leg: No edema.     Left lower leg: No edema.     Comments: Is able to move all extremities Right leg in immobilizer   Lymphadenopathy:     Cervical: No cervical adenopathy.  Skin:    General: Skin is warm and dry.     Comments: Incision line without signs of infection present   Neurological:  Mental Status: He is alert and oriented to person, place, and time.  Psychiatric:        Mood and Affect: Mood normal.      ASSESSMENT/ PLAN:  TODAY:   1.  Hypertension associated with diabetes: is stable b/p 108/52: will continue tenoretic 50/25 mg daily; accupril 20 mg daily   2. Lymphocytic colitis: is stable will continue budesonide 9 mg daily colace twice daily   3. Dyslipidemia  associated with type 2 diabetes mellitus: is stable will continue mevacor 80 mg daily   4. Diabetes mellitus type 2 non-insulin dependent: is stable will continue metformin 500 mg three times daily   5. GERD without esophagitis: is stable will continue protonix 40 mg daily   6. Chronic gout without tophi: is stable will continue allopurinol 100 mg daily   7. Primary osteoarthritis right knee: is stable will continue therapy as directed and will follow up with orthopedics; will continue asa 325 mg twice daily  ultram 50 mg every 6 hours as needed and will hold oxycodone 5 mg tabs.   8. BPH: is stable will continue flomax 0.4 mg daily   9. Genital herpes: is stable without recent flair: will continue acyclovir 400 mg twice daily        MD is aware of resident's narcotic use and is in agreement with current plan of care. We will attempt to wean resident as apropriate   Ok Edwards NP [facility]  Contact [phone] Monday through Friday 8am- 5pm  After hours call [phone]

## 2018-11-20 ENCOUNTER — Encounter: Payer: Self-pay | Admitting: Adult Health

## 2018-11-20 ENCOUNTER — Non-Acute Institutional Stay (SKILLED_NURSING_FACILITY): Payer: Medicare HMO | Admitting: Adult Health

## 2018-11-20 DIAGNOSIS — M1711 Unilateral primary osteoarthritis, right knee: Secondary | ICD-10-CM | POA: Diagnosis not present

## 2018-11-20 DIAGNOSIS — I951 Orthostatic hypotension: Secondary | ICD-10-CM | POA: Diagnosis not present

## 2018-11-20 DIAGNOSIS — Z96651 Presence of right artificial knee joint: Secondary | ICD-10-CM

## 2018-11-20 LAB — CULTURE, BLOOD (ROUTINE X 2)
Culture: NO GROWTH
Culture: NO GROWTH
Special Requests: ADEQUATE

## 2018-11-20 NOTE — Progress Notes (Signed)
Location:   [facility] [place] Room Number: 218A Place of Service:  SNF (31)   CODE STATUS: FULL  Allergies  Allergen Reactions  . Gabapentin Other (See Comments)    unknown  . Bactrim [Sulfamethoxazole-Trimethoprim] Rash    Diffuse drug reaction - maculopapular rash  . Penicillins Hives    Has patient had a PCN reaction causing immediate rash, facial/tongue/throat swelling, SOB or lightheadedness with hypotension: Unknown Has patient had a PCN reaction causing severe rash involving mucus membranes or skin necrosis: Unknown Has patient had a PCN reaction that required hospitalization: Unknown Has patient had a PCN reaction occurring within the last 10 years: Unknown If all of the above answers are "NO", then may proceed with Cephalosporin use.   . Lasix [Furosemide] Other (See Comments)    Drops blood pressure and drained potassium and magnesium    Chief Complaint  Patient presents with  . Acute Visit    Family Concerns    HPI:  He is hypotensive; he states it gets worse after taking his oxycodone. He states that the pain is gripping. He has been orthostatic. He continues to have insomnia. His pain in his right knee continues to poorly controlled as well. We have discussed his pain management; and the fact that he needs to use his knee to help improve his pain in the long term. We will try to avoid use of oxycodone.  He did voice understanding.   Past Medical History:  Diagnosis Date  . Acute posthemorrhagic anemia   . Aortic valve sclerosis 08/13/2016   Sclerosis without stenosis by [place] (08/2016)  . CAD (coronary artery disease) 2005   s/p stent ([place]ath)  . Cholelithiasis    by CT  . Chronic kidney disease, stage 3, mod decreased GFR (HCC)   . Diabetes mellitus ([place]) 2010  . Diverticulosis    by CT  . DJD (degenerative joint disease)    knee  . Genital herpes   . Gout   . Heart murmur    followed by PCP  . HLD (hyperlipidemia)   . HTN  (hypertension)   . Knee pain, right    "needs replacement"  . Neuropathy    feet  . Obesity   . Seizures ([place])    x1 - after craniotomy (1960s)  . Weakness of both legs    "since back surgery"    Past Surgical History:  Procedure Laterality Date  . APPENDECTOMY  1958  . CLOSED REDUCTION CLAVICLE FRACTURE  1955  . COLONOSCOPY  02/2008   int hemorrhoids, diverticula (Dr. Allen Norris)  . COLONOSCOPY N/A 08/26/2018   TA, rpt 3 yrs (Vanga, [place])  . COLONOSCOPY WITH PROPOFOL N/A 11/10/2015   diverticulosis, path with microscopic colitis [name], MD)  . CORONARY ANGIOPLASTY WITH STENT PLACEMENT  2005   stent 2005  . DECOMPRESSIVE LUMBAR LAMINECTOMY LEVEL 1  03/2018   herniated L4/5 disc R with free fargment over L4 s/p surgery L4 (Krasinksi)  . ESOPHAGOGASTRODUODENOSCOPY N/A 08/26/2018   reactive gastritis with benign biopsies (Vanga, Rohini Reddy)  . HERNIA REPAIR  1956   inguinal  . KNEE ARTHROSCOPY  2008   torn meniscus  . SUBDURAL HEMATOMA EVACUATION VIA CRANIOTOMY  1964   hit in helmet by baseball  . TOTAL KNEE ARTHROPLASTY Right 11/12/2018   Procedure: TOTAL KNEE ARTHROPLASTY;  Surgeon: Thornton Park, MD;  Location: [facility] ORS;  Service: Orthopedics;  Laterality: Right;    Social History   Socioeconomic History  .  Marital status: Married    Spouse name: Not on file  . Number of children: Not on file  . Years of education: Not on file  . Highest education level: Not on file  Occupational History  . Not on file  Social Needs  . Financial resource strain: Not hard at all  . Food insecurity:    Worry: Never true    Inability: Never true  . Transportation needs:    Medical: No    Non-medical: No  Tobacco Use  . Smoking status: Never Smoker  . Smokeless tobacco: Never Used  Substance and Sexual Activity  . Alcohol use: No    Alcohol/week: 0.0 standard drinks  . Drug use: No  . Sexual activity: Not Currently  Lifestyle  . Physical activity:    Days per  week: 0 days    Minutes per session: Not on file  . Stress: To some extent  Relationships  . Social connections:    Talks on phone: Twice a week    Gets together: Twice a week    Attends religious service: 1 to 4 times per year    Active member of club or organization: No    Attends meetings of clubs or organizations: Not on file    Relationship status: Married  . Intimate partner violence:    Fear of current or ex partner: Patient refused    Emotionally abused: Patient refused    Physically abused: Patient refused    Forced sexual activity: Patient refused  Other Topics Concern  . Not on file  Social History Narrative   Caffeine: 2 cans of diet soda/day   Lives with wife.  1 dog at home.  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 Diabetes Father 77  . Coronary artery disease Sister        catheterizations  . COPD Brother   . Stroke Brother   . Hypertension Brother   . Cancer Neg Hx       VITAL SIGNS BP (!) 107/38   Pulse 83   Temp (!) 97.5 F (36.4 C) (Oral)   Resp 20   Ht 5' 7.5" (1.715 m)   Wt 253 lb 14.4 oz (115.2 kg)   SpO2 98%   BMI 39.18 kg/m   Outpatient Encounter Medications as of 11/20/2018  Medication Sig  . acetaminophen (TYLENOL) 325 MG tablet Take 2 tablets (650 mg total) by mouth every 6 (six) hours as needed for mild pain (pain score 1-3 or temp > 100.5).  Marland Kitchen acyclovir (ZOVIRAX) 400 MG tablet Take 1 tablet (400 mg total) by mouth 2 (two) times daily.  Marland Kitchen allopurinol (ZYLOPRIM) 100 MG tablet Take 1 tablet (100 mg total) by mouth daily.  Marland Kitchen aspirin EC 325 MG EC tablet Take 1 tablet (325 mg total) by mouth 2 (two) times daily.  Marland Kitchen atenolol-chlorthalidone (TENORETIC) 50-25 MG tablet Take 1 tablet by mouth daily.  Marland Kitchen b complex vitamins tablet Take 1  tablet by mouth daily.  . Biotin 1000 MCG tablet Take 1,000 mcg by mouth daily.   . bisacodyl (DULCOLAX) 10 MG suppository Place 1 suppository (10 mg total) rectally daily as needed for moderate constipation.  . budesonide (ENTOCORT EC) 3 MG 24 hr capsule Take 9 mg by mouth daily.  Marland Kitchen  cholecalciferol (VITAMIN D3) 25 MCG (1000 UT) tablet Take 1,000 Units by mouth daily.   Marland Kitchen docusate sodium (COLACE) 100 MG capsule Take 1 capsule (100 mg total) by mouth 2 (two) times daily.  Marland Kitchen lovastatin (MEVACOR) 40 MG tablet Take 2 tablets (80 mg total) by mouth at bedtime.  . Magnesium 500 MG TABS Take 500 mg by mouth daily.  . metFORMIN (GLUCOPHAGE) 500 MG tablet Take 1 tablet (500 mg total) by mouth 3 (three) times daily before meals.  . Multiple Vitamin (MULTIVITAMIN) tablet Take 1 tablet by mouth daily.  . NON FORMULARY Diet Type:  Regular  . oxyCODONE (OXY IR/ROXICODONE) 5 MG immediate release tablet Take 1-2 tablets (5-10 mg total) by mouth every 4 (four) hours as needed for moderate pain or severe pain (pain score 4-6).  . pantoprazole (PROTONIX) 40 MG tablet Take 1 tablet (40 mg total) by mouth daily.  . quinapril (ACCUPRIL) 20 MG tablet Take 1 tablet (20 mg total) by mouth at bedtime.  . tamsulosin (FLOMAX) 0.4 MG CAPS capsule Take 1 capsule (0.4 mg total) by mouth at bedtime.  . vitamin C (ASCORBIC ACID) 500 MG tablet Take 500 mg by mouth daily.  . [DISCONTINUED] Potassium Chloride ER 20 MEQ TBCR Take 20 mEq by mouth 2 (two) times daily.  . [DISCONTINUED] traMADol (ULTRAM) 50 MG tablet Take 1 tablet (50 mg total) by mouth every 6 (six) hours as needed for moderate pain.   No facility-administered encounter medications on file as of 11/20/2018.      SIGNIFICANT DIAGNOSTIC EXAMS   LABS REVIEWED TODAY:   11-17-18: wbc 6.7; hgb 8.1; hct 24.8; mcv 97.3; plt 195; glucose 154; bun 29; creat 1.26; k+ 4.1; na++ 132; ca 8.0 mag 2.0    Review of Systems  Constitutional: Negative for malaise/fatigue.    Respiratory: Negative for cough and shortness of breath.   Cardiovascular: Negative for chest pain, palpitations and leg swelling.  Gastrointestinal: Negative for abdominal pain, constipation and heartburn.  Musculoskeletal: Positive for joint pain. Negative for back pain and myalgias.  Skin: Negative.   Neurological: Negative for dizziness.  Psychiatric/Behavioral: The patient has insomnia. The patient is not nervous/anxious.     Physical Exam Constitutional:      General: He is not in acute distress.    Appearance: He is well-developed. He is not diaphoretic.  Neck:     Musculoskeletal: Neck supple.     Thyroid: No thyromegaly.  Cardiovascular:     Rate and Rhythm: Normal rate and regular rhythm.     Pulses: Normal pulses.     Heart sounds: Normal heart sounds.  Pulmonary:     Effort: Pulmonary effort is normal. No respiratory distress.     Breath sounds: Normal breath sounds.  Abdominal:     General: Bowel sounds are normal. There is no distension.     Palpations: Abdomen is soft.     Tenderness: There is no abdominal tenderness.  Musculoskeletal:     Right lower leg: No edema.     Left lower leg: No edema.     Comments: Is able to move all extremities Right leg in immobilizer    Lymphadenopathy:     Cervical: No cervical adenopathy.  Skin:    General: Skin is warm and dry.     Comments: Incision line without signs of infection present   Neurological:     Mental Status: He is alert and oriented to person, place, and time.  Psychiatric:  Mood and Affect: Mood normal.        ASSESSMENT/ PLAN:  TODAY:   1. Primary osteoarthritis right knee status post right knee replacement.  2. Orthostatic hypotension  Will stop accupril Will change oxycodone to twice daily as needed prior to therapy Will begin tylenol 650 mg four times daily  Will begin robaxin 500 mg every 6 hours through 11-23-18 then every 6 hours as needed      MD is aware of resident's  narcotic use and is in agreement with current plan of care. We will attempt to wean resident as apropriate   Ok Edwards NP [facility]  Contact [phone] Monday through Friday 8am- 5pm  After hours call [phone]

## 2018-11-23 ENCOUNTER — Encounter: Payer: Self-pay | Admitting: Adult Health

## 2018-11-23 ENCOUNTER — Non-Acute Institutional Stay (SKILLED_NURSING_FACILITY): Payer: Medicare HMO | Admitting: Adult Health

## 2018-11-23 ENCOUNTER — Ambulatory Visit: Payer: Medicare HMO | Admitting: Gastroenterology

## 2018-11-23 ENCOUNTER — Encounter: Payer: Self-pay | Admitting: Urology

## 2018-11-23 ENCOUNTER — Ambulatory Visit: Payer: Medicare HMO | Admitting: Urology

## 2018-11-23 DIAGNOSIS — I10 Essential (primary) hypertension: Secondary | ICD-10-CM

## 2018-11-23 DIAGNOSIS — Z96651 Presence of right artificial knee joint: Secondary | ICD-10-CM | POA: Diagnosis not present

## 2018-11-23 DIAGNOSIS — I152 Hypertension secondary to endocrine disorders: Secondary | ICD-10-CM

## 2018-11-23 DIAGNOSIS — E1159 Type 2 diabetes mellitus with other circulatory complications: Secondary | ICD-10-CM | POA: Diagnosis not present

## 2018-11-23 DIAGNOSIS — M1711 Unilateral primary osteoarthritis, right knee: Secondary | ICD-10-CM

## 2018-11-23 DIAGNOSIS — T8149XA Infection following a procedure, other surgical site, initial encounter: Secondary | ICD-10-CM | POA: Diagnosis not present

## 2018-11-23 NOTE — Progress Notes (Signed)
[facility]:   [facility] Room Number: 218 A Place of Service:  SNF (31)   CODE STATUS: Full Code  Allergies  Allergen Reactions  . Gabapentin Other (See Comments)    unknown  . Bactrim [Sulfamethoxazole-Trimethoprim] Rash    Diffuse drug reaction - maculopapular rash  . Penicillins Hives    Has patient had a PCN reaction causing immediate rash, facial/tongue/throat swelling, SOB or lightheadedness with hypotension: Unknown Has patient had a PCN reaction causing severe rash involving mucus membranes or skin necrosis: Unknown Has patient had a PCN reaction that required hospitalization: Unknown Has patient had a PCN reaction occurring within the last 10 years: Unknown If all of the above answers are "NO", then may proceed with Cephalosporin use.   . Lasix [Furosemide] Other (See Comments)    Drops blood pressure and drained potassium and magnesium    Chief Complaint  Patient presents with  . Acute Visit    Pain Management    HPI:  Staff is concerned about his pain management. His incision line on right knee is red and warm with drainage present on the distal end. He is noted to have worsening edema to his left leg is now 3+. He would like to take the oxycodone prior to therapy to help prevent post therapy pain. He states that the ice has little effect on his pain relief. He does continue to have soft blood pressure readings.   Past Medical History:  Diagnosis Date  . Acute posthemorrhagic anemia   . Aortic valve sclerosis 08/13/2016   Sclerosis without stenosis by [place] (08/2016)  . CAD (coronary artery disease) 2005   s/p stent (Fath)  . Cholelithiasis    by CT  . Chronic kidney disease, stage 3, mod decreased GFR (HCC)   . Diabetes mellitus ([place]) 2010  . Diverticulosis    by CT  . DJD (degenerative joint disease)    knee  . Genital herpes   . Gout   . Heart murmur    followed by PCP  . HLD (hyperlipidemia)   . HTN (hypertension)   . Knee  pain, right    "needs replacement"  . Neuropathy    feet  . Obesity   . Seizures ([place])    x1 - after craniotomy (1960s)  . Weakness of both legs    "since back surgery"    Past Surgical History:  Procedure Laterality Date  . APPENDECTOMY  1958  . CLOSED REDUCTION CLAVICLE FRACTURE  1955  . COLONOSCOPY  02/2008   int hemorrhoids, diverticula (Dr. Allen Norris)  . COLONOSCOPY N/A 08/26/2018   TA, rpt 3 yrs (Vanga, [place])  . COLONOSCOPY WITH PROPOFOL N/A 11/10/2015   diverticulosis, path with microscopic colitis [name], MD)  . CORONARY ANGIOPLASTY WITH STENT PLACEMENT  2005   stent 2005  . DECOMPRESSIVE LUMBAR LAMINECTOMY LEVEL 1  03/2018   herniated L4/5 disc R with free fargment over L4 s/p surgery L4 (Krasinksi)  . ESOPHAGOGASTRODUODENOSCOPY N/A 08/26/2018   reactive gastritis with benign biopsies (Vanga, Rohini Reddy)  . HERNIA REPAIR  1956   inguinal  . KNEE ARTHROSCOPY  2008   torn meniscus  . SUBDURAL HEMATOMA EVACUATION VIA CRANIOTOMY  1964   hit in helmet by baseball  . TOTAL KNEE ARTHROPLASTY Right 11/12/2018   Procedure: TOTAL KNEE ARTHROPLASTY;  Surgeon: Thornton Park, MD;  Location: [facility] ORS;  Service: Orthopedics;  Laterality: Right;    Social History   Socioeconomic History  . Marital  status: Married    Spouse name: Not on file  . Number of children: Not on file  . Years of education: Not on file  . Highest education level: Not on file  Occupational History  . Not on file  Social Needs  . Financial resource strain: Not hard at all  . Food insecurity:    Worry: Never true    Inability: Never true  . Transportation needs:    Medical: No    Non-medical: No  Tobacco Use  . Smoking status: Never Smoker  . Smokeless tobacco: Never Used  Substance and Sexual Activity  . Alcohol use: No    Alcohol/week: 0.0 standard drinks  . Drug use: No  . Sexual activity: Not Currently  Lifestyle  . Physical activity:    Days per week: 0 days    Minutes  per session: Not on file  . Stress: To some extent  Relationships  . Social connections:    Talks on phone: Twice a week    Gets together: Twice a week    Attends religious service: 1 to 4 times per year    Active member of club or organization: No    Attends meetings of clubs or organizations: Not on file    Relationship status: Married  . Intimate partner violence:    Fear of current or ex partner: Patient refused    Emotionally abused: Patient refused    Physically abused: Patient refused    Forced sexual activity: Patient refused  Other Topics Concern  . Not on file  Social History Narrative   Caffeine: 2 cans of diet soda/day   Lives with wife.  1 dog at home.  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 Diabetes Father 56  . Coronary artery disease Sister        catheterizations  . COPD Brother   . Stroke Brother   . Hypertension Brother   . Cancer Neg Hx       VITAL SIGNS BP (!) 115/53   Pulse 80   Temp 98 F (36.7 C)   Resp 20   Ht 5' 7.5" (1.715 m)   Wt 253 lb 14.4 oz (115.2 kg)   SpO2 98%   BMI 39.18 kg/m   Outpatient Encounter Medications as of 11/23/2018  Medication Sig  . acetaminophen (TYLENOL) 325 MG tablet Take 2 tablets (650 mg total) by mouth every 6 (six) hours as needed for mild pain (pain score 1-3 or temp > 100.5).  Marland Kitchen acyclovir (ZOVIRAX) 400 MG tablet Take 1 tablet (400 mg total) by mouth 2 (two) times daily.  Marland Kitchen allopurinol (ZYLOPRIM) 100 MG tablet Take 1 tablet (100 mg total) by mouth daily.  Marland Kitchen aspirin EC 325 MG EC tablet Take 1 tablet (325 mg total) by mouth 2 (two) times daily.  Marland Kitchen atenolol-chlorthalidone (TENORETIC) 50-25 MG tablet Take 1 tablet by mouth daily.  Marland Kitchen b complex vitamins tablet Take 1 tablet by mouth daily.  . Biotin 1000  MCG tablet Take 1,000 mcg by mouth daily.   . bisacodyl (DULCOLAX) 10 MG suppository Place 1 suppository (10 mg total) rectally daily as needed for moderate constipation.  . budesonide (ENTOCORT EC) 3 MG 24 hr capsule Take 9 mg by mouth daily.  . cholecalciferol (VITAMIN D3)  25 MCG (1000 UT) tablet Take 1,000 Units by mouth daily.   Marland Kitchen docusate sodium (COLACE) 100 MG capsule Take 1 capsule (100 mg total) by mouth 2 (two) times daily.  Marland Kitchen lovastatin (MEVACOR) 40 MG tablet Take 2 tablets (80 mg total) by mouth at bedtime.  . Magnesium 500 MG TABS Take 500 mg by mouth daily.  . Melatonin 3 MG TABS Take 1 tablet by mouth at bedtime.  . metFORMIN (GLUCOPHAGE) 500 MG tablet Take 1 tablet (500 mg total) by mouth 3 (three) times daily before meals.  . methocarbamol (ROBAXIN) 500 MG tablet Take 500 mg by mouth every 6 (six) hours. From 11/20/18 - 11/23/18 then begin every 6 hours as needed on 11/24/18  . Multiple Vitamin (MULTIVITAMIN) tablet Take 1 tablet by mouth daily.  . NON FORMULARY Diet Type:  Regular  . oxyCODONE (OXY IR/ROXICODONE) 5 MG immediate release tablet Take 5 mg by mouth 2 (two) times daily as needed for severe pain. Give 5 mg 30 minutes prior to therapy for pain management  . pantoprazole (PROTONIX) 40 MG tablet Take 1 tablet (40 mg total) by mouth daily.  . tamsulosin (FLOMAX) 0.4 MG CAPS capsule Take 1 capsule (0.4 mg total) by mouth at bedtime.  . vitamin C (ASCORBIC ACID) 500 MG tablet Take 500 mg by mouth daily.  . [DISCONTINUED] oxyCODONE (OXY IR/ROXICODONE) 5 MG immediate release tablet Take 1-2 tablets (5-10 mg total) by mouth every 4 (four) hours as needed for moderate pain or severe pain (pain score 4-6). (Patient not taking: Reported on 11/23/2018)  . [DISCONTINUED] quinapril (ACCUPRIL) 20 MG tablet Take 1 tablet (20 mg total) by mouth at bedtime. (Patient not taking: Reported on 11/23/2018)   No facility-administered encounter medications on file as of 11/23/2018.       SIGNIFICANT DIAGNOSTIC EXAMS  LABS REVIEWED PREVIOUS:   11-17-18: wbc 6.7; hgb 8.1; hct 24.8; mcv 97.3; plt 195; glucose 154; bun 29; creat 1.26; k+ 4.1; na++ 132; ca 8.0 mag 2.0   NO NEW LABS.   Review of Systems  Constitutional: Negative for malaise/fatigue.  Respiratory: Negative for cough and shortness of breath.   Cardiovascular: Negative for chest pain, palpitations and leg swelling.  Gastrointestinal: Negative for abdominal pain, constipation and heartburn.  Musculoskeletal: Positive for joint pain. Negative for back pain and myalgias.  Skin: Negative.   Neurological: Negative for dizziness.  Psychiatric/Behavioral: The patient has insomnia. The patient is not nervous/anxious.     Physical Exam Constitutional:      General: He is not in acute distress.    Appearance: He is well-developed. He is not diaphoretic.  Neck:     Musculoskeletal: Neck supple.     Thyroid: No thyromegaly.  Cardiovascular:     Rate and Rhythm: Normal rate and regular rhythm.     Pulses: Normal pulses.     Heart sounds: Normal heart sounds.  Pulmonary:     Effort: Pulmonary effort is normal. No respiratory distress.     Breath sounds: Normal breath sounds.  Abdominal:     General: Bowel sounds are normal. There is no distension.     Palpations: Abdomen is soft.     Tenderness: There is no abdominal tenderness.  Musculoskeletal:     Right lower leg: Edema present.     Left lower leg: No edema.     Comments: Is able to move all extremities Right leg with immobilizer  3+ right lower extremity edema  Lymphadenopathy:     Cervical: No  cervical adenopathy.  Skin:    General: Skin is warm and dry.     Comments: Distal incision line is red; hot; tender with drainage present.   Neurological:     Mental Status: He is alert and oriented to person, place, and time.  Psychiatric:        Mood and Affect: Mood normal.      ASSESSMENT/ PLAN:  TODAY:   1. Primary osteoarthritis right  knee is status post replacement  2. Cellulitis wound postoperative 3. Hypertension associated with diabetes  Will hold blood pressure medications for systolic <786 Will obtain venous doppler right lower extremity Will being doxycycline 100 mg twice daily through 12-02-18       MD is aware of resident's narcotic use and is in agreement with current plan of care. We will attempt to wean resident as apropriate   Ok Edwards NP [facility] Adult [facility]  Contact [phone] Monday through Friday 8am- 5pm  After hours call [phone]

## 2018-11-24 ENCOUNTER — Encounter: Payer: Self-pay | Admitting: Adult Health

## 2018-11-24 ENCOUNTER — Non-Acute Institutional Stay (SKILLED_NURSING_FACILITY): Payer: Medicare HMO | Admitting: Adult Health

## 2018-11-24 DIAGNOSIS — T8149XA Infection following a procedure, other surgical site, initial encounter: Secondary | ICD-10-CM | POA: Diagnosis not present

## 2018-11-24 DIAGNOSIS — K52832 Lymphocytic colitis: Secondary | ICD-10-CM | POA: Diagnosis not present

## 2018-11-24 DIAGNOSIS — E1169 Type 2 diabetes mellitus with other specified complication: Secondary | ICD-10-CM | POA: Diagnosis not present

## 2018-11-24 DIAGNOSIS — E785 Hyperlipidemia, unspecified: Secondary | ICD-10-CM

## 2018-11-24 DIAGNOSIS — E1159 Type 2 diabetes mellitus with other circulatory complications: Secondary | ICD-10-CM | POA: Diagnosis not present

## 2018-11-24 DIAGNOSIS — I10 Essential (primary) hypertension: Secondary | ICD-10-CM | POA: Diagnosis not present

## 2018-11-24 NOTE — Progress Notes (Signed)
[facility]:   [facility] Room Number: 218 A Place of Service:  SNF (31)   CODE STATUS: Full Code  Allergies  Allergen Reactions  . Gabapentin Other (See Comments)    unknown  . Bactrim [Sulfamethoxazole-Trimethoprim] Rash    Diffuse drug reaction - maculopapular rash  . Penicillins Hives    Has patient had a PCN reaction causing immediate rash, facial/tongue/throat swelling, SOB or lightheadedness with hypotension: Unknown Has patient had a PCN reaction causing severe rash involving mucus membranes or skin necrosis: Unknown Has patient had a PCN reaction that required hospitalization: Unknown Has patient had a PCN reaction occurring within the last 10 years: Unknown If all of the above answers are "NO", then may proceed with Cephalosporin use.   . Lasix [Furosemide] Other (See Comments)    Drops blood pressure and drained potassium and magnesium    Chief Complaint  Patient presents with  . Medical Management of Chronic Issues    Hypertension associated with diabetes; lymphocytic colitis; dyslipidemia; associated with type 2 diabetes mellitus; weekly follow up for the first 30 days post hospitalization. Care plan meeting.     HPI:  He is a 73 year old short term rehab patient being seen for the management of his chronic illnesses: hypertension; lymphocytic colitis; dyslipidemia. He is presently being treated for right lower extremity cellulitis. He does have right knee pain; denies nay vertigo; no changes in appetite; continues with insomnia.  We have come together for his routine care plan meeting; he does have family present. He is ambulating with walker with therapy. He continues to have immobilizer on. He does have a foley; did miss urology appointment. Will need to remove foley. His goal remains for him to return back home.     Past Medical History:  Diagnosis Date  . Acute posthemorrhagic anemia   . Aortic valve sclerosis 08/13/2016   Sclerosis  without stenosis by [place] (08/2016)  . CAD (coronary artery disease) 2005   s/p stent (Fath)  . Cholelithiasis    by CT  . Chronic kidney disease, stage 3, mod decreased GFR (HCC)   . Diabetes mellitus ([place]) 2010  . Diverticulosis    by CT  . DJD (degenerative joint disease)    knee  . Genital herpes   . Gout   . Heart murmur    followed by PCP  . HLD (hyperlipidemia)   . HTN (hypertension)   . Knee pain, right    "needs replacement"  . Neuropathy    feet  . Obesity   . Seizures ([place])    x1 - after craniotomy (1960s)  . Weakness of both legs    "since back surgery"    Past Surgical History:  Procedure Laterality Date  . APPENDECTOMY  1958  . CLOSED REDUCTION CLAVICLE FRACTURE  1955  . COLONOSCOPY  02/2008   int hemorrhoids, diverticula (Dr. Allen Norris)  . COLONOSCOPY N/A 08/26/2018   TA, rpt 3 yrs (Vanga, [place])  . COLONOSCOPY WITH PROPOFOL N/A 11/10/2015   diverticulosis, path with microscopic colitis [name], MD)  . CORONARY ANGIOPLASTY WITH STENT PLACEMENT  2005   stent 2005  . DECOMPRESSIVE LUMBAR LAMINECTOMY LEVEL 1  03/2018   herniated L4/5 disc R with free fargment over L4 s/p surgery L4 (Krasinksi)  . ESOPHAGOGASTRODUODENOSCOPY N/A 08/26/2018   reactive gastritis with benign biopsies (Vanga, Rohini Reddy)  . HERNIA REPAIR  1956   inguinal  . KNEE ARTHROSCOPY  2008   torn meniscus  .  SUBDURAL HEMATOMA EVACUATION VIA CRANIOTOMY  1964   hit in helmet by baseball  . TOTAL KNEE ARTHROPLASTY Right 11/12/2018   Procedure: TOTAL KNEE ARTHROPLASTY;  Surgeon: Thornton Park, MD;  Location: [facility] ORS;  Service: Orthopedics;  Laterality: Right;    Social History   Socioeconomic History  . Marital status: Married    Spouse name: Not on file  . Number of children: Not on file  . Years of education: Not on file  . Highest education level: Not on file  Occupational History  . Not on file  Social Needs  . Financial resource strain: Not hard at all  . Food  insecurity:    Worry: Never true    Inability: Never true  . Transportation needs:    Medical: No    Non-medical: No  Tobacco Use  . Smoking status: Never Smoker  . Smokeless tobacco: Never Used  Substance and Sexual Activity  . Alcohol use: No    Alcohol/week: 0.0 standard drinks  . Drug use: No  . Sexual activity: Not Currently  Lifestyle  . Physical activity:    Days per week: 0 days    Minutes per session: Not on file  . Stress: To some extent  Relationships  . Social connections:    Talks on phone: Twice a week    Gets together: Twice a week    Attends religious service: 1 to 4 times per year    Active member of club or organization: No    Attends meetings of clubs or organizations: Not on file    Relationship status: Married  . Intimate partner violence:    Fear of current or ex partner: Patient refused    Emotionally abused: Patient refused    Physically abused: Patient refused    Forced sexual activity: Patient refused  Other Topics Concern  . Not on file  Social History Narrative   Caffeine: 2 cans of diet soda/day   Lives with wife.  1 dog at home.  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 Diabetes Father 67  . Coronary artery disease Sister        catheterizations  . COPD Brother   . Stroke Brother   . Hypertension Brother   . Cancer Neg Hx       VITAL SIGNS BP (!) 115/53   Pulse 80   Temp 97.6 F (36.4 C)   Resp 20   Ht 5' 7.5" (1.715 m)   Wt 253 lb 14.4 oz (115.2 kg)   SpO2 98%   BMI 39.18 kg/m   Outpatient Encounter Medications as of 11/24/2018  Medication Sig  . acetaminophen (TYLENOL) 325 MG tablet Take 650 mg by mouth every 6 (six) hours.  Marland Kitchen acyclovir (ZOVIRAX) 400 MG tablet Take 1 tablet (400 mg total) by mouth 2 (two) times  daily.  Marland Kitchen allopurinol (ZYLOPRIM) 100 MG tablet Take 1 tablet (100 mg total) by mouth daily.  Marland Kitchen aspirin EC 325 MG EC tablet Take 1 tablet (325 mg total) by mouth 2 (two) times daily.  Marland Kitchen atenolol-chlorthalidone (TENORETIC) 50-25 MG tablet Take 1 tablet by mouth daily.  Marland Kitchen b complex vitamins tablet Take 1 tablet by mouth daily.  . Biotin 1000 MCG tablet Take 1,000 mcg by mouth daily.   Marland Kitchen  bisacodyl (DULCOLAX) 10 MG suppository Place 1 suppository (10 mg total) rectally daily as needed for moderate constipation.  . budesonide (ENTOCORT EC) 3 MG 24 hr capsule Take 9 mg by mouth daily.  . cholecalciferol (VITAMIN D3) 25 MCG (1000 UT) tablet Take 1,000 Units by mouth daily.   Marland Kitchen docusate sodium (COLACE) 100 MG capsule Take 1 capsule (100 mg total) by mouth 2 (two) times daily.  Marland Kitchen doxycycline (VIBRAMYCIN) 100 MG capsule Take 100 mg by mouth 2 (two) times daily.  Marland Kitchen lovastatin (MEVACOR) 40 MG tablet Take 2 tablets (80 mg total) by mouth at bedtime.  . Magnesium 500 MG TABS Take 500 mg by mouth daily.  . Melatonin 3 MG TABS Take 1 tablet by mouth at bedtime.  . metFORMIN (GLUCOPHAGE) 500 MG tablet Take 1 tablet (500 mg total) by mouth 3 (three) times daily before meals.  . Multiple Vitamin (MULTIVITAMIN) tablet Take 1 tablet by mouth daily.  . NON FORMULARY Diet Type:  Regular  . oxyCODONE (OXY IR/ROXICODONE) 5 MG immediate release tablet Take 5 mg by mouth 2 (two) times daily as needed for severe pain. Give 5 mg 30 minutes prior to therapy for pain management  . pantoprazole (PROTONIX) 40 MG tablet Take 1 tablet (40 mg total) by mouth daily.  . tamsulosin (FLOMAX) 0.4 MG CAPS capsule Take 1 capsule (0.4 mg total) by mouth at bedtime.  . vitamin C (ASCORBIC ACID) 500 MG tablet Take 500 mg by mouth daily.  . [DISCONTINUED] acetaminophen (TYLENOL) 325 MG tablet Take 2 tablets (650 mg total) by mouth every 6 (six) hours as needed for mild pain (pain score 1-3 or temp > 100.5). (Patient not taking: Reported on  11/24/2018)   No facility-administered encounter medications on file as of 11/24/2018.      SIGNIFICANT DIAGNOSTIC EXAMS   LABS REVIEWED PREVIOUS:   11-17-18: wbc 6.7; hgb 8.1; hct 24.8; mcv 97.3; plt 195; glucose 154; bun 29; creat 1.26; k+ 4.1; na++ 132; ca 8.0 mag 2.0   NO NEW LABS.   Review of Systems  Constitutional: Negative for malaise/fatigue.  Respiratory: Negative for cough and shortness of breath.   Cardiovascular: Negative for chest pain, palpitations and leg swelling.  Gastrointestinal: Negative for abdominal pain, constipation and heartburn.  Musculoskeletal: Positive for joint pain. Negative for back pain and myalgias.  Skin: Negative.   Neurological: Negative for dizziness.  Psychiatric/Behavioral: The patient has insomnia. The patient is not nervous/anxious.     Physical Exam Constitutional:      General: He is not in acute distress.    Appearance: He is well-developed. He is obese. He is not diaphoretic.  Neck:     Musculoskeletal: Neck supple.     Thyroid: No thyromegaly.  Cardiovascular:     Rate and Rhythm: Normal rate and regular rhythm.     Pulses: Normal pulses.     Heart sounds: Normal heart sounds.  Pulmonary:     Effort: Pulmonary effort is normal. No respiratory distress.     Breath sounds: Normal breath sounds.  Abdominal:     General: Bowel sounds are normal. There is no distension.     Palpations: Abdomen is soft.     Tenderness: There is no abdominal tenderness.  Genitourinary:    Comments: foley Musculoskeletal:     Right lower leg: Edema present.     Left lower leg: No edema.     Comments: Is able to move all extremities Right leg with immobilizer  2+ right lower  extremity edema    Lymphadenopathy:     Cervical: No cervical adenopathy.  Skin:    General: Skin is warm and dry.     Comments: Incision with less redness and inflammation present.   Neurological:     Mental Status: He is alert and oriented to person, place, and time.     ASSESSMENT/ PLAN:  TODAY:   1.  Hypertension associated with diabetes: is stable b/p 115/53: will continue tenoretic 50/25 mg daily; the accupril was stopped  2. Lymphocytic colitis: is stable will continue budesonide 9 mg daily colace twice daily   3. Dyslipidemia associated with type 2 diabetes mellitus: is stable will continue mevacor 80 mg daily   4. Right lower extremity cellulitis: is slowly improving: will complete doxycycline and will monitor  PREVIOUS   5. Diabetes mellitus type 2 non-insulin dependent: is stable will continue metformin 500 mg three times daily   6. GERD without esophagitis: is stable will continue protonix 40 mg daily   7. Chronic gout without tophi: is stable will continue allopurinol 100 mg daily   8. Primary osteoarthritis right knee: is stable will continue therapy as directed and will follow up with orthopedics; will continue asa 325 mg twice daily  Tylenol 650 mg four times daily and oxycodone 5 mg twice daily prior to therapy.   9. BPH: is stable will continue flomax 0.4 mg daily   10. Genital herpes: is stable without recent flair: will continue acyclovir 400 mg twice daily   11. Acute urinary retention: will remove foley and will I/O cath every 6 hours as needed will use bladder scan every 6 hours  Will check cbc; cmp        MD is aware of resident's narcotic use and is in agreement with current plan of care. We will attempt to wean resident as apropriate   Ok Edwards NP [facility] Adult Medicine  Contact [phone] Monday through Friday 8am- 5pm  After hours call [phone]

## 2018-11-25 ENCOUNTER — Other Ambulatory Visit
Admission: RE | Admit: 2018-11-25 | Discharge: 2018-11-25 | Disposition: A | Discharge status: Home / Self Care | Payer: Medicare HMO | Source: Ambulatory Visit | Attending: Adult Health | Admitting: Adult Health

## 2018-11-25 ENCOUNTER — Other Ambulatory Visit: Payer: Self-pay | Admitting: Adult Health

## 2018-11-25 DIAGNOSIS — K219 Gastro-esophageal reflux disease without esophagitis: Secondary | ICD-10-CM | POA: Insufficient documentation

## 2018-11-25 DIAGNOSIS — M1A20X0 Drug-induced chronic gout, unspecified site, without tophus (tophi): Secondary | ICD-10-CM | POA: Insufficient documentation

## 2018-11-25 DIAGNOSIS — N183 Chronic kidney disease, stage 3 (moderate): Secondary | ICD-10-CM | POA: Insufficient documentation

## 2018-11-25 DIAGNOSIS — N400 Enlarged prostate without lower urinary tract symptoms: Secondary | ICD-10-CM | POA: Insufficient documentation

## 2018-11-25 DIAGNOSIS — E871 Hypo-osmolality and hyponatremia: Secondary | ICD-10-CM | POA: Insufficient documentation

## 2018-11-25 DIAGNOSIS — D649 Anemia, unspecified: Secondary | ICD-10-CM | POA: Insufficient documentation

## 2018-11-25 DIAGNOSIS — A6000 Herpesviral infection of urogenital system, unspecified: Secondary | ICD-10-CM | POA: Insufficient documentation

## 2018-11-25 DIAGNOSIS — I951 Orthostatic hypotension: Secondary | ICD-10-CM | POA: Insufficient documentation

## 2018-11-25 LAB — CBC WITH DIFFERENTIAL/PLATELET
Abs Immature Granulocytes: 0.22 10*3/uL — ABNORMAL HIGH (ref 0.00–0.07)
Basophils Absolute: 0 10*3/uL (ref 0.0–0.1)
Basophils Relative: 0 %
Eosinophils Absolute: 0.2 10*3/uL (ref 0.0–0.5)
Eosinophils Relative: 2 %
HCT: 27.5 % — ABNORMAL LOW (ref 39.0–52.0)
Hemoglobin: 9 g/dL — ABNORMAL LOW (ref 13.0–17.0)
Immature Granulocytes: 2 %
Lymphocytes Relative: 14 %
Lymphs Abs: 1.4 10*3/uL (ref 0.7–4.0)
MCH: 32.5 pg (ref 26.0–34.0)
MCHC: 32.7 g/dL (ref 30.0–36.0)
MCV: 99.3 fL (ref 80.0–100.0)
Monocytes Absolute: 0.6 10*3/uL (ref 0.1–1.0)
Monocytes Relative: 6 %
NEUTROS PCT: 76 %
Neutro Abs: 7.4 10*3/uL (ref 1.7–7.7)
Platelets: 494 10*3/uL — ABNORMAL HIGH (ref 150–400)
RBC: 2.77 MIL/uL — ABNORMAL LOW (ref 4.22–5.81)
RDW: 15.3 % (ref 11.5–15.5)
WBC: 9.8 10*3/uL (ref 4.0–10.5)
nRBC: 0.9 % — ABNORMAL HIGH (ref 0.0–0.2)

## 2018-11-25 LAB — BASIC METABOLIC PANEL
Anion gap: 8 (ref 5–15)
BUN: 27 mg/dL — ABNORMAL HIGH (ref 8–23)
CO2: 29 mmol/L (ref 22–32)
Calcium: 8.7 mg/dL — ABNORMAL LOW (ref 8.9–10.3)
Chloride: 99 mmol/L (ref 98–111)
Creatinine, Ser: 1.16 mg/dL (ref 0.61–1.24)
GFR calc Af Amer: >60 mL/min (ref >60)
GFR calc non Af Amer: >60 mL/min (ref >60)
Glucose, Bld: 136 mg/dL — ABNORMAL HIGH (ref 70–99)
POTASSIUM: 3.4 mmol/L — AB (ref 3.5–5.1)
Sodium: 136 mmol/L (ref 135–145)

## 2018-11-25 MED ORDER — OXYCODONE HCL 5 MG PO TABS: ORAL_TABLET | ORAL | 0 refills | Status: DC

## 2018-11-26 DIAGNOSIS — T8149XA Infection following a procedure, other surgical site, initial encounter: Secondary | ICD-10-CM | POA: Insufficient documentation

## 2018-11-26 DIAGNOSIS — M47817 Spondylosis without myelopathy or radiculopathy, lumbosacral region: Secondary | ICD-10-CM | POA: Diagnosis not present

## 2018-11-28 ENCOUNTER — Encounter
Admission: RE | Admit: 2018-11-28 | Discharge: 2018-11-28 | Disposition: A | Discharge status: Home / Self Care | Payer: Medicare HMO | Source: Ambulatory Visit | Attending: Internal Medicine | Admitting: Internal Medicine

## 2018-11-30 ENCOUNTER — Non-Acute Institutional Stay (SKILLED_NURSING_FACILITY): Payer: Medicare HMO | Admitting: Adult Health

## 2018-11-30 ENCOUNTER — Encounter: Payer: Self-pay | Admitting: Adult Health

## 2018-11-30 ENCOUNTER — Other Ambulatory Visit: Payer: Self-pay | Admitting: Adult Health

## 2018-11-30 DIAGNOSIS — E1159 Type 2 diabetes mellitus with other circulatory complications: Secondary | ICD-10-CM | POA: Diagnosis not present

## 2018-11-30 DIAGNOSIS — I152 Hypertension secondary to endocrine disorders: Secondary | ICD-10-CM

## 2018-11-30 DIAGNOSIS — I10 Essential (primary) hypertension: Secondary | ICD-10-CM

## 2018-11-30 DIAGNOSIS — R600 Localized edema: Secondary | ICD-10-CM

## 2018-11-30 DIAGNOSIS — M1711 Unilateral primary osteoarthritis, right knee: Secondary | ICD-10-CM

## 2018-11-30 MED ORDER — OXYCODONE HCL 5 MG PO TABS: ORAL_TABLET | ORAL | 0 refills | Status: DC

## 2018-11-30 NOTE — Progress Notes (Signed)
Location:   [facility] Room Number: 218 A Place of Service:  SNF (31)   CODE STATUS: Full Code  Allergies  Allergen Reactions  . Gabapentin Other (See Comments)    unknown  . Bactrim [Sulfamethoxazole-Trimethoprim] Rash    Diffuse drug reaction - maculopapular rash  . Penicillins Hives    Has patient had a PCN reaction causing immediate rash, facial/tongue/throat swelling, SOB or lightheadedness with hypotension: Unknown Has patient had a PCN reaction causing severe rash involving mucus membranes or skin necrosis: Unknown Has patient had a PCN reaction that required hospitalization: Unknown Has patient had a PCN reaction occurring within the last 10 years: Unknown If all of the above answers are "NO", then may proceed with Cephalosporin use.   . Lasix [Furosemide] Other (See Comments)    Drops blood pressure and drained potassium and magnesium    Chief Complaint  Patient presents with  . Acute Visit    Pain Management    HPI:  He is status post right knee replacement. He continues to have pain which is getting better; however he states that he needs the oxycodone. We have discussed risks of addiction and he has verbalized understanding. He continues to have right lower extremity edema. He denies any fevers; no changes in appetite.   Past Medical History:  Diagnosis Date  . Acute posthemorrhagic anemia   . Aortic valve sclerosis 08/13/2016   Sclerosis without stenosis by [place] (08/2016)  . CAD (coronary artery disease) 2005   s/p stent (Fath)  . Cholelithiasis    by CT  . Chronic kidney disease, stage 3, mod decreased GFR (HCC)   . Diabetes mellitus ([place]) 2010  . Diverticulosis    by CT  . DJD (degenerative joint disease)    knee  . Genital herpes   . Gout   . Heart murmur    followed by PCP  . HLD (hyperlipidemia)   . HTN (hypertension)   . Knee pain, right    "needs replacement"  . Neuropathy    feet  . Obesity   . Seizures ([place])     x1 - after craniotomy (1960s)  . Weakness of both legs    "since back surgery"    Past Surgical History:  Procedure Laterality Date  . APPENDECTOMY  1958  . CLOSED REDUCTION CLAVICLE FRACTURE  1955  . COLONOSCOPY  02/2008   int hemorrhoids, diverticula (Dr. Allen Norris)  . COLONOSCOPY N/A 08/26/2018   TA, rpt 3 yrs (Vanga, [place])  . COLONOSCOPY WITH PROPOFOL N/A 11/10/2015   diverticulosis, path with microscopic colitis [name], MD)  . CORONARY ANGIOPLASTY WITH STENT PLACEMENT  2005   stent 2005  . DECOMPRESSIVE LUMBAR LAMINECTOMY LEVEL 1  03/2018   herniated L4/5 disc R with free fargment over L4 s/p surgery L4 (Krasinksi)  . ESOPHAGOGASTRODUODENOSCOPY N/A 08/26/2018   reactive gastritis with benign biopsies (Vanga, Rohini Reddy)  . HERNIA REPAIR  1956   inguinal  . KNEE ARTHROSCOPY  2008   torn meniscus  . SUBDURAL HEMATOMA EVACUATION VIA CRANIOTOMY  1964   hit in helmet by baseball  . TOTAL KNEE ARTHROPLASTY Right 11/12/2018   Procedure: TOTAL KNEE ARTHROPLASTY;  Surgeon: Thornton Park, MD;  Location: [facility] ORS;  Service: Orthopedics;  Laterality: Right;    Social History   Socioeconomic History  . Marital status: Married    Spouse name: Not on file  . Number of children: Not on file  . Years of education: Not  on file  . Highest education level: Not on file  Occupational History  . Not on file  Social Needs  . Financial resource strain: Not hard at all  . Food insecurity:    Worry: Never true    Inability: Never true  . Transportation needs:    Medical: No    Non-medical: No  Tobacco Use  . Smoking status: Never Smoker  . Smokeless tobacco: Never Used  Substance and Sexual Activity  . Alcohol use: No    Alcohol/week: 0.0 standard drinks  . Drug use: No  . Sexual activity: Not Currently  Lifestyle  . Physical activity:    Days per week: 0 days    Minutes per session: Not on file  . Stress: To some extent  Relationships  . Social connections:     Talks on phone: Twice a week    Gets together: Twice a week    Attends religious service: 1 to 4 times per year    Active member of club or organization: No    Attends meetings of clubs or organizations: Not on file    Relationship status: Married  . Intimate partner violence:    Fear of current or ex partner: Patient refused    Emotionally abused: Patient refused    Physically abused: Patient refused    Forced sexual activity: Patient refused  Other Topics Concern  . Not on file  Social History Narrative   Caffeine: 2 cans of diet soda/day   Lives with wife.  1 dog at home.  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 Diabetes Father 79  . Coronary artery disease Sister        catheterizations  . COPD Brother   . Stroke Brother   . Hypertension Brother   . Cancer Neg Hx       VITAL SIGNS BP (!) 88/62   Pulse (!) 101   Temp (!) 97.4 F (36.3 C)   Resp 16   Ht 5' 7.5" (1.715 m)   Wt 253 lb 14.4 oz (115.2 kg)   SpO2 99%   BMI 39.18 kg/m   Outpatient Encounter Medications as of 11/30/2018  Medication Sig  . Acetaminophen 500 MG coapsule Take 1,000 mg by mouth 3 (three) times daily.   Marland Kitchen acyclovir (ZOVIRAX) 400 MG tablet Take 1 tablet (400 mg total) by mouth 2 (two) times daily.  Marland Kitchen allopurinol (ZYLOPRIM) 100 MG tablet Take 1 tablet (100 mg total) by mouth daily.  Marland Kitchen aspirin EC 325 MG EC tablet Take 1 tablet (325 mg total) by mouth 2 (two) times daily.  Marland Kitchen atenolol-chlorthalidone (TENORETIC) 50-25 MG tablet Take 1 tablet by mouth daily.  Marland Kitchen b complex vitamins tablet Take 1 tablet by mouth daily.  . Biotin 1000 MCG tablet Take 1,000 mcg by mouth daily.   . bisacodyl (DULCOLAX) 10 MG suppository Place 1 suppository (10 mg total) rectally daily as needed for moderate  constipation.  . budesonide (ENTOCORT EC) 3 MG 24 hr capsule Take 9 mg by mouth daily.  . cholecalciferol (VITAMIN D3) 25 MCG (1000 UT) tablet Take 1,000 Units by mouth daily.   Marland Kitchen docusate sodium (COLACE) 100 MG capsule Take 1 capsule (100 mg total) by mouth 2 (two) times daily.  Marland Kitchen doxycycline (VIBRAMYCIN) 100  MG capsule Take 100 mg by mouth 2 (two) times daily.  Marland Kitchen lovastatin (MEVACOR) 40 MG tablet Take 2 tablets (80 mg total) by mouth at bedtime.  . Magnesium 500 MG TABS Take 500 mg by mouth daily.  . Melatonin 3 MG TABS Take 1 tablet by mouth at bedtime.  . metFORMIN (GLUCOPHAGE) 500 MG tablet Take 1 tablet (500 mg total) by mouth 3 (three) times daily before meals.  . Multiple Vitamin (MULTIVITAMIN) tablet Take 1 tablet by mouth daily.  . NON FORMULARY Diet Type:  Regular  . oxyCODONE (OXY IR/ROXICODONE) 5 MG immediate release tablet Take 1 tab for pain 5-7/10 and 2 tabs for 8-10/10 pain every 4 hours as needed  . pantoprazole (PROTONIX) 40 MG tablet Take 1 tablet (40 mg total) by mouth daily.  . tamsulosin (FLOMAX) 0.4 MG CAPS capsule Take 1 capsule (0.4 mg total) by mouth at bedtime.  . traZODone (DESYREL) 50 MG tablet Take 25 mg by mouth at bedtime.  . vitamin C (ASCORBIC ACID) 500 MG tablet Take 500 mg by mouth daily.   No facility-administered encounter medications on file as of 11/30/2018.      SIGNIFICANT DIAGNOSTIC EXAMS   LABS REVIEWED PREVIOUS:   11-17-18: wbc 6.7; hgb 8.1; hct 24.8; mcv 97.3; plt 195; glucose 154; bun 29; creat 1.26; k+ 4.1; na++ 132; ca 8.0 mag 2.0   TODAY:   11-25-18: wbc 9.8; hgb 9.0 hct 27.5; mcv 99.3; plt 494; glucose 136; bun 27; creat 1.16; k+ 3.4; na++ 136; ca 8.7    Review of Systems  Constitutional: Negative for malaise/fatigue.  Respiratory: Negative for cough and shortness of breath.   Cardiovascular: Positive for leg swelling. Negative for chest pain and palpitations.  Gastrointestinal: Negative for abdominal pain, constipation and  heartburn.  Musculoskeletal: Positive for joint pain. Negative for back pain and myalgias.       Knee pain is improving   Skin: Negative.   Neurological: Negative for dizziness.  Psychiatric/Behavioral: The patient is not nervous/anxious.     Physical Exam Constitutional:      General: He is not in acute distress.    Appearance: He is well-developed. He is obese. He is not diaphoretic.  Neck:     Musculoskeletal: Neck supple.     Thyroid: No thyromegaly.  Cardiovascular:     Rate and Rhythm: Normal rate and regular rhythm.     Pulses: Normal pulses.     Heart sounds: Normal heart sounds.  Pulmonary:     Effort: Pulmonary effort is normal. No respiratory distress.     Breath sounds: Normal breath sounds.  Abdominal:     General: Bowel sounds are normal. There is no distension.     Palpations: Abdomen is soft.     Tenderness: There is no abdominal tenderness.  Musculoskeletal:     Right lower leg: Edema present.     Left lower leg: No edema.     Comments: Is able to move all extremities Right leg with immobilizer  2-3+ right lower extremity edema    Lymphadenopathy:     Cervical: No cervical adenopathy.  Skin:    General: Skin is warm and dry.  Neurological:     Mental Status: He is alert and oriented to person, place, and time.  Psychiatric:        Mood and Affect: Mood normal.      ASSESSMENT/ PLAN:  TODAY:   1.  Hypertension associated with diabetes:  2. Right lower extremity edema 3.  Primary osteoarthritis of right knee  Will continue oxycodone 5 or 10 mg for 7 more days Will tenoretic Will begin atenolol 25 mg nightly  Will increase chlorthalide to 50 mg daily Will check BMP 12-07-18    MD is aware of resident's narcotic use and is in agreement with current plan of care. We will attempt to wean resident as apropriate   Ok Edwards NP [facility]  Contact [phone] Monday through Friday 8am- 5pm  After hours call [phone]

## 2018-12-02 ENCOUNTER — Encounter: Payer: Self-pay | Admitting: Adult Health

## 2018-12-02 ENCOUNTER — Non-Acute Institutional Stay (SKILLED_NURSING_FACILITY): Payer: Medicare HMO | Admitting: Adult Health

## 2018-12-02 ENCOUNTER — Other Ambulatory Visit: Payer: Self-pay | Admitting: Adult Health

## 2018-12-02 DIAGNOSIS — K219 Gastro-esophageal reflux disease without esophagitis: Secondary | ICD-10-CM | POA: Diagnosis not present

## 2018-12-02 DIAGNOSIS — M1711 Unilateral primary osteoarthritis, right knee: Secondary | ICD-10-CM

## 2018-12-02 DIAGNOSIS — E119 Type 2 diabetes mellitus without complications: Secondary | ICD-10-CM

## 2018-12-02 DIAGNOSIS — M1A29X0 Drug-induced chronic gout, multiple sites, without tophus (tophi): Secondary | ICD-10-CM | POA: Diagnosis not present

## 2018-12-02 MED ORDER — OXYCODONE HCL 5 MG PO TABS: ORAL_TABLET | ORAL | 0 refills | Status: DC

## 2018-12-02 NOTE — Progress Notes (Signed)
Location:   [facility] Room Number: 218 A Place of Service:  SNF (31)   CODE STATUS: Full Code  Allergies  Allergen Reactions  . Gabapentin Other (See Comments)    unknown  . Bactrim [Sulfamethoxazole-Trimethoprim] Rash    Diffuse drug reaction - maculopapular rash  . Penicillins Hives    Has patient had a PCN reaction causing immediate rash, facial/tongue/throat swelling, SOB or lightheadedness with hypotension: Unknown Has patient had a PCN reaction causing severe rash involving mucus membranes or skin necrosis: Unknown Has patient had a PCN reaction that required hospitalization: Unknown Has patient had a PCN reaction occurring within the last 10 years: Unknown If all of the above answers are "NO", then may proceed with Cephalosporin use.   . Lasix [Furosemide] Other (See Comments)    Drops blood pressure and drained potassium and magnesium    Chief Complaint  Patient presents with  . Medical Management of Chronic Issues    Diabetes mellitus type 2 noninsulin dependent; gerd without esophagitis; primary osteoarthritis of right knee; drug induced chronic gout of multiple sites without tophi. Weekly follow up for the first 30 days post hospitalization.     HPI:  He is a 73 year old short term rehab patient being seen for the management of his chronic illnesses; diabetes; gerd; osteoarthritis; gout. He is doing well in therapy is ambulating to and from the therapy room. He denies any uncontrolled pain; no changes in appetite no heart burn; no insomnia.   Past Medical History:  Diagnosis Date  . Acute posthemorrhagic anemia   . Aortic valve sclerosis 08/13/2016   Sclerosis without stenosis by [place] (08/2016)  . CAD (coronary artery disease) 2005   s/p stent (Fath)  . Cholelithiasis    by CT  . Chronic kidney disease, stage 3, mod decreased GFR (HCC)   . Diabetes mellitus ([place]) 2010  . Diverticulosis    by CT  . DJD (degenerative joint disease)      knee  . Genital herpes   . Gout   . Heart murmur    followed by PCP  . HLD (hyperlipidemia)   . HTN (hypertension)   . Knee pain, right    "needs replacement"  . Neuropathy    feet  . Obesity   . Seizures ([place])    x1 - after craniotomy (1960s)  . Weakness of both legs    "since back surgery"    Past Surgical History:  Procedure Laterality Date  . APPENDECTOMY  1958  . CLOSED REDUCTION CLAVICLE FRACTURE  1955  . COLONOSCOPY  02/2008   int hemorrhoids, diverticula (Dr. Allen Norris)  . COLONOSCOPY N/A 08/26/2018   TA, rpt 3 yrs (Vanga, [place])  . COLONOSCOPY WITH PROPOFOL N/A 11/10/2015   diverticulosis, path with microscopic colitis [name], MD)  . CORONARY ANGIOPLASTY WITH STENT PLACEMENT  2005   stent 2005  . DECOMPRESSIVE LUMBAR LAMINECTOMY LEVEL 1  03/2018   herniated L4/5 disc R with free fargment over L4 s/p surgery L4 (Krasinksi)  . ESOPHAGOGASTRODUODENOSCOPY N/A 08/26/2018   reactive gastritis with benign biopsies (Vanga, Rohini Reddy)  . HERNIA REPAIR  1956   inguinal  . KNEE ARTHROSCOPY  2008   torn meniscus  . SUBDURAL HEMATOMA EVACUATION VIA CRANIOTOMY  1964   hit in helmet by baseball  . TOTAL KNEE ARTHROPLASTY Right 11/12/2018   Procedure: TOTAL KNEE ARTHROPLASTY;  Surgeon: Thornton Park, MD;  Location: [facility] ORS;  Service: Orthopedics;  Laterality: Right;  Social History   Socioeconomic History  . Marital status: Married    Spouse name: Not on file  . Number of children: Not on file  . Years of education: Not on file  . Highest education level: Not on file  Occupational History  . Not on file  Social Needs  . Financial resource strain: Not hard at all  . Food insecurity:    Worry: Never true    Inability: Never true  . Transportation needs:    Medical: No    Non-medical: No  Tobacco Use  . Smoking status: Never Smoker  . Smokeless tobacco: Never Used  Substance and Sexual Activity  . Alcohol use: No    Alcohol/week: 0.0 standard  drinks  . Drug use: No  . Sexual activity: Not Currently  Lifestyle  . Physical activity:    Days per week: 0 days    Minutes per session: Not on file  . Stress: To some extent  Relationships  . Social connections:    Talks on phone: Twice a week    Gets together: Twice a week    Attends religious service: 1 to 4 times per year    Active member of club or organization: No    Attends meetings of clubs or organizations: Not on file    Relationship status: Married  . Intimate partner violence:    Fear of current or ex partner: Patient refused    Emotionally abused: Patient refused    Physically abused: Patient refused    Forced sexual activity: Patient refused  Other Topics Concern  . Not on file  Social History Narrative   Caffeine: 2 cans of diet soda/day   Lives with wife.  1 dog at home.  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 Diabetes Father 22  . Coronary artery disease Sister        catheterizations  . COPD Brother   . Stroke Brother   . Hypertension Brother   . Cancer Neg Hx       VITAL SIGNS BP 131/61   Pulse 83   Temp 98 F (36.7 C)   Resp 18   Ht 5' 7.5" (1.715 m)   Wt 237 lb 1.6 oz (107.5 kg)   SpO2 98%   BMI 36.59 kg/m   Outpatient Encounter Medications as of 12/02/2018  Medication Sig  . Acetaminophen 500 MG coapsule Take 1,000 mg by mouth 3 (three) times daily.   Marland Kitchen acyclovir (ZOVIRAX) 400 MG tablet Take 1 tablet (400 mg total) by mouth 2 (two) times daily.  Marland Kitchen allopurinol (ZYLOPRIM) 100 MG tablet Take 1 tablet (100 mg total) by mouth daily.  Marland Kitchen aspirin EC 325 MG EC tablet Take 1 tablet (325 mg total) by mouth 2 (two) times daily.  Marland Kitchen atenolol-chlorthalidone (TENORETIC) 50-50 MG tablet Take 1 tablet by mouth daily.  Marland Kitchen b complex vitamins tablet  Take 1 tablet by mouth daily.  . Biotin 1000 MCG tablet Take 1,000 mcg by mouth daily.   . bisacodyl (DULCOLAX) 10 MG suppository Place 1 suppository (10 mg total) rectally daily as needed for moderate constipation.  . budesonide (ENTOCORT EC) 3 MG 24 hr capsule Take 9 mg by mouth daily.  . chlorthalidone (HYGROTON) 50 MG tablet Take 50 mg by mouth  daily.  . cholecalciferol (VITAMIN D3) 25 MCG (1000 UT) tablet Take 1,000 Units by mouth daily.   Marland Kitchen docusate sodium (COLACE) 100 MG capsule Take 1 capsule (100 mg total) by mouth 2 (two) times daily.  Marland Kitchen doxycycline (VIBRAMYCIN) 100 MG capsule Take 100 mg by mouth 2 (two) times daily.  Marland Kitchen lovastatin (MEVACOR) 40 MG tablet Take 2 tablets (80 mg total) by mouth at bedtime.  . Magnesium 500 MG TABS Take 500 mg by mouth daily.  . Melatonin 3 MG TABS Take 1 tablet by mouth at bedtime.  . metFORMIN (GLUCOPHAGE) 500 MG tablet Take 1 tablet (500 mg total) by mouth 3 (three) times daily before meals.  . Multiple Vitamin (MULTIVITAMIN) tablet Take 1 tablet by mouth daily.  . NON FORMULARY Diet Type:  Regular  . oxyCODONE (OXY IR/ROXICODONE) 5 MG immediate release tablet Take 1 tab for pain 5-7/10 and 2 tabs for 8-10/10 pain every 4 hours as needed  . pantoprazole (PROTONIX) 40 MG tablet Take 1 tablet (40 mg total) by mouth daily.  . tamsulosin (FLOMAX) 0.4 MG CAPS capsule Take 1 capsule (0.4 mg total) by mouth at bedtime.  . traZODone (DESYREL) 50 MG tablet Take 25 mg by mouth at bedtime.  . vitamin C (ASCORBIC ACID) 500 MG tablet Take 500 mg by mouth daily.   No facility-administered encounter medications on file as of 12/02/2018.      SIGNIFICANT DIAGNOSTIC EXAMS    LABS REVIEWED PREVIOUS:   11-17-18: wbc 6.7; hgb 8.1; hct 24.8; mcv 97.3; plt 195; glucose 154; bun 29; creat 1.26; k+ 4.1; na++ 132; ca 8.0 mag 2.0  11-25-18: wbc 9.8; hgb 9.0 hct 27.5; mcv 99.3; plt 494; glucose 136; bun 27; creat 1.16; k+ 3.4; na++ 136; ca 8.7   NO NEW LABS.    Review  of Systems  Constitutional: Negative for malaise/fatigue.  Respiratory: Negative for cough and shortness of breath.   Cardiovascular: Negative for chest pain, palpitations and leg swelling.  Gastrointestinal: Negative for abdominal pain, constipation and heartburn.  Musculoskeletal: Negative for back pain, joint pain and myalgias.  Skin: Negative.   Neurological: Negative for dizziness.  Psychiatric/Behavioral: The patient is not nervous/anxious.      Physical Exam Constitutional:      General: He is not in acute distress.    Appearance: He is well-developed. He is obese. He is not diaphoretic.  Neck:     Musculoskeletal: Neck supple.     Thyroid: No thyromegaly.  Cardiovascular:     Rate and Rhythm: Normal rate and regular rhythm.     Pulses: Normal pulses.     Heart sounds: Normal heart sounds.  Pulmonary:     Effort: Pulmonary effort is normal. No respiratory distress.     Breath sounds: Normal breath sounds.  Abdominal:     General: Bowel sounds are normal. There is no distension.     Palpations: Abdomen is soft.     Tenderness: There is no abdominal tenderness.  Musculoskeletal:     Right lower leg: Edema present.     Left lower leg: No edema.     Comments:  Is able to move all extremities Right leg with immobilizer  2+ right lower extremity edema   Lymphadenopathy:     Cervical: No cervical adenopathy.  Skin:    General: Skin is warm and dry.     Comments: Right knee incision line without signs of infection present.   Neurological:     Mental Status: He is  alert and oriented to person, place, and time.  Psychiatric:        Mood and Affect: Mood normal.     ASSESSMENT/ PLAN:  TODAY:   1. Diabetes mellitus type 2 non-insulin dependent: is stable will continue metformin 500 mg three times daily   2. GERD without esophagitis: is stable will continue protonix 40 mg daily   3. Chronic gout without tophi: is stable will continue allopurinol 100 mg daily   4.  Primary osteoarthritis right knee: is stable will continue therapy as directed and will follow up with orthopedics; will continue asa 325 mg twice daily  Tylenol 1 gm three times  daily and oxycodone 5 mg to 10 mg every 4 hours as needed.   PREVIOUS   5. BPH: is stable will continue flomax 0.4 mg daily   6. Genital herpes: is stable without recent flair: will continue acyclovir 400 mg twice daily   7.  Hypertension associated with diabetes: is stable b/p 131/61: will continue tenoretic 50/50 mg daily; the accupril was stopped  8. Lymphocytic colitis: is stable will continue budesonide 9 mg daily colace twice daily   9. Dyslipidemia associated with type 2 diabetes mellitus: is stable will continue mevacor 80 mg daily   MD is aware of resident's narcotic use and is in agreement with current plan of care. We will attempt to wean resident as apropriate   Ok Edwards NP [facility]  Contact [phone] Monday through Friday 8am- 5pm  After hours call [phone]

## 2018-12-04 ENCOUNTER — Non-Acute Institutional Stay (SKILLED_NURSING_FACILITY): Payer: Medicare HMO | Admitting: Adult Health

## 2018-12-04 ENCOUNTER — Encounter: Payer: Self-pay | Admitting: Adult Health

## 2018-12-04 ENCOUNTER — Other Ambulatory Visit: Payer: Self-pay | Admitting: Adult Health

## 2018-12-04 DIAGNOSIS — M1711 Unilateral primary osteoarthritis, right knee: Secondary | ICD-10-CM | POA: Diagnosis not present

## 2018-12-04 DIAGNOSIS — Z96651 Presence of right artificial knee joint: Secondary | ICD-10-CM | POA: Diagnosis not present

## 2018-12-04 MED ORDER — LOVASTATIN 40 MG PO TABS: 80.0000 mg | ORAL_TABLET | Freq: Every day | ORAL | 0 refills | Status: DC

## 2018-12-04 MED ORDER — BUDESONIDE 3 MG PO CPEP: 9.0000 mg | ORAL_CAPSULE | Freq: Every day | ORAL | 0 refills | Status: DC

## 2018-12-04 MED ORDER — ALLOPURINOL 100 MG PO TABS: 100.0000 mg | ORAL_TABLET | Freq: Every day | ORAL | 0 refills | Status: DC

## 2018-12-04 MED ORDER — METFORMIN HCL 500 MG PO TABS: 500.0000 mg | ORAL_TABLET | Freq: Three times a day (TID) | ORAL | 0 refills | Status: DC

## 2018-12-04 MED ORDER — ATENOLOL 25 MG PO TABS: 25.0000 mg | ORAL_TABLET | Freq: Every day | ORAL | 0 refills | Status: DC

## 2018-12-04 MED ORDER — OXYCODONE HCL 5 MG PO TABS: ORAL_TABLET | ORAL | 0 refills | Status: AC

## 2018-12-04 MED ORDER — PANTOPRAZOLE SODIUM 40 MG PO TBEC: 40.0000 mg | DELAYED_RELEASE_TABLET | Freq: Every day | ORAL | 0 refills | Status: DC

## 2018-12-04 MED ORDER — TRAZODONE HCL 50 MG PO TABS: 25.0000 mg | ORAL_TABLET | Freq: Every day | ORAL | 0 refills | Status: DC

## 2018-12-04 MED ORDER — TAMSULOSIN HCL 0.4 MG PO CAPS: 0.4000 mg | ORAL_CAPSULE | Freq: Every day | ORAL | 0 refills | Status: DC

## 2018-12-04 MED ORDER — ACYCLOVIR 400 MG PO TABS: 400.0000 mg | ORAL_TABLET | Freq: Two times a day (BID) | ORAL | 0 refills | Status: DC

## 2018-12-04 MED ORDER — CHLORTHALIDONE 50 MG PO TABS: 50.0000 mg | ORAL_TABLET | Freq: Every day | ORAL | 0 refills | Status: DC

## 2018-12-04 NOTE — Progress Notes (Addendum)
Location:   [facility] Room Number: 218 A Place of Service:  SNF (31)    CODE STATUS: Full Code  Allergies  Allergen Reactions  . Gabapentin Other (See Comments)    unknown  . Bactrim [Sulfamethoxazole-Trimethoprim] Rash    Diffuse drug reaction - maculopapular rash  . Penicillins Hives    Has patient had a PCN reaction causing immediate rash, facial/tongue/throat swelling, SOB or lightheadedness with hypotension: Unknown Has patient had a PCN reaction causing severe rash involving mucus membranes or skin necrosis: Unknown Has patient had a PCN reaction that required hospitalization: Unknown Has patient had a PCN reaction occurring within the last 10 years: Unknown If all of the above answers are "NO", then may proceed with Cephalosporin use.   . Lasix [Furosemide] Other (See Comments)    Drops blood pressure and drained potassium and magnesium    Chief Complaint  Patient presents with  . Discharge Note    Discharging to home on 12/07/2018    HPI:  He is being discharged to home with home health for pt/ot. He will need a front wheel walker and 3:1 commode. He will need his prescriptions written and will need to follow up with his medical provider He had been hospitalized for a right knee replacement. He was admitted to this facility for short term rehab. He has completed rehab at the SNF level and is now ready for discharge to home.     Past Medical History:  Diagnosis Date  . Acute posthemorrhagic anemia   . Aortic valve sclerosis 08/13/2016   Sclerosis without stenosis by [place] (08/2016)  . CAD (coronary artery disease) 2005   s/p stent ([place])  . Cholelithiasis    by CT  . Chronic kidney disease, stage 3, mod decreased GFR (HCC)   . Diabetes mellitus ([place]) 2010  . Diverticulosis    by CT  . DJD (degenerative joint disease)    knee  . Genital herpes   . Gout   . Heart murmur    followed by PCP  . HLD (hyperlipidemia)   . HTN  (hypertension)   . Knee pain, right    "needs replacement"  . Neuropathy    feet  . Obesity   . Seizures ([place])    x1 - after craniotomy (1960s)  . Weakness of both legs    "since back surgery"    Past Surgical History:  Procedure Laterality Date  . APPENDECTOMY  1958  . CLOSED REDUCTION CLAVICLE FRACTURE  1955  . COLONOSCOPY  02/2008   int hemorrhoids, diverticula (Dr. Allen Norris)  . COLONOSCOPY N/A 08/26/2018   TA, rpt 3 yrs ([place], [place])  . COLONOSCOPY WITH PROPOFOL N/A 11/10/2015   diverticulosis, path with microscopic colitis [name], MD)  . CORONARY ANGIOPLASTY WITH STENT PLACEMENT  2005   stent 2005  . DECOMPRESSIVE LUMBAR LAMINECTOMY LEVEL 1  03/2018   herniated L4/5 disc R with free fargment over L4 s/p surgery L4 ([place])  . ESOPHAGOGASTRODUODENOSCOPY N/A 08/26/2018   reactive gastritis with benign biopsies ([place], Rohini Reddy)  . HERNIA REPAIR  1956   inguinal  . KNEE ARTHROSCOPY  2008   torn meniscus  . SUBDURAL HEMATOMA EVACUATION VIA CRANIOTOMY  1964   hit in helmet by baseball  . TOTAL KNEE ARTHROPLASTY Right 11/12/2018   Procedure: TOTAL KNEE ARTHROPLASTY;  Surgeon: Thornton Park, MD;  Location: [facility] ORS;  Service: Orthopedics;  Laterality: Right;    Social History   Socioeconomic History  .  Marital status: Married    Spouse name: Not on file  . Number of children: Not on file  . Years of education: Not on file  . Highest education level: Not on file  Occupational History  . Not on file  Social Needs  . Financial resource strain: Not hard at all  . Food insecurity:    Worry: Never true    Inability: Never true  . Transportation needs:    Medical: No    Non-medical: No  Tobacco Use  . Smoking status: Never Smoker  . Smokeless tobacco: Never Used  Substance and Sexual Activity  . Alcohol use: No    Alcohol/week: 0.0 standard drinks  . Drug use: No  . Sexual activity: Not Currently  Lifestyle  . Physical activity:    Days per  week: 0 days    Minutes per session: Not on file  . Stress: To some extent  Relationships  . Social connections:    Talks on phone: Twice a week    Gets together: Twice a week    Attends religious service: 1 to 4 times per year    Active member of club or organization: No    Attends meetings of clubs or organizations: Not on file    Relationship status: Married  . Intimate partner violence:    Fear of current or ex partner: Patient refused    Emotionally abused: Patient refused    Physically abused: Patient refused    Forced sexual activity: Patient refused  Other Topics Concern  . Not on file  Social History Narrative   Caffeine: 2 cans of diet soda/day   Lives with wife.  1 dog at home.  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 Diabetes Father 17  . Coronary artery disease Sister        catheterizations  . COPD Brother   . Stroke Brother   . Hypertension Brother   . Cancer Neg Hx     VITAL SIGNS BP 135/69   Pulse 93   Temp 97.8 F (36.6 C)   Resp 20   Ht 5' 7.5" (1.715 m)   Wt 237 lb 1.6 oz (107.5 kg)   SpO2 95%   BMI 36.59 kg/m   Patient's Medications  New Prescriptions   No medications on file  Previous Medications   ACETAMINOPHEN 500 MG COAPSULE    Take 1,000 mg by mouth 3 (three) times daily.    ACYCLOVIR (ZOVIRAX) 400 MG TABLET    Take 1 tablet (400 mg total) by mouth 2 (two) times daily.   ALLOPURINOL (ZYLOPRIM) 100 MG TABLET    Take 1 tablet (100 mg total) by mouth daily.   ASPIRIN EC 325 MG EC TABLET    Take 1 tablet (325 mg total) by mouth 2 (two) times daily.   ATENOLOL (TENORMIN) 25 MG TABLET    Take 25 mg by mouth at bedtime.   B COMPLEX VITAMINS TABLET    Take 1 tablet by mouth daily.   BIOTIN 1000 MCG TABLET    Take 1,000 mcg by mouth  daily.    BISACODYL (DULCOLAX) 10 MG SUPPOSITORY    Place 1 suppository (10 mg total) rectally daily as needed for moderate constipation.   BUDESONIDE (ENTOCORT EC) 3 MG 24  HR CAPSULE    Take 9 mg by mouth daily.   CHLORTHALIDONE (HYGROTON) 50 MG TABLET    Take 50 mg by mouth daily.   CHOLECALCIFEROL (VITAMIN D3) 25 MCG (1000 UT) TABLET    Take 1,000 Units by mouth daily.    DOCUSATE SODIUM (COLACE) 100 MG CAPSULE    Take 1 capsule (100 mg total) by mouth 2 (two) times daily.   LOVASTATIN (MEVACOR) 40 MG TABLET    Take 2 tablets (80 mg total) by mouth at bedtime.   MAGNESIUM 500 MG TABS    Take 500 mg by mouth daily.   MELATONIN 3 MG TABS    Take 1 tablet by mouth at bedtime.   METFORMIN (GLUCOPHAGE) 500 MG TABLET    Take 1 tablet (500 mg total) by mouth 3 (three) times daily before meals.   MULTIPLE VITAMIN (MULTIVITAMIN) TABLET    Take 1 tablet by mouth daily.   NON FORMULARY    Diet Type:  Regular consistency, NCS   OXYCODONE (OXY IR/ROXICODONE) 5 MG IMMEDIATE RELEASE TABLET    Take 1 tab for pain 5-7/10 and 2 tabs for 8-10/10 pain every 4 hours as needed   PANTOPRAZOLE (PROTONIX) 40 MG TABLET    Take 1 tablet (40 mg total) by mouth daily.   TAMSULOSIN (FLOMAX) 0.4 MG CAPS CAPSULE    Take 1 capsule (0.4 mg total) by mouth at bedtime.   TRAZODONE (DESYREL) 50 MG TABLET    Take 25 mg by mouth at bedtime.   VITAMIN C (ASCORBIC ACID) 500 MG TABLET    Take 500 mg by mouth daily.  Modified Medications   No medications on file  Discontinued Medications   ATENOLOL-CHLORTHALIDONE (TENORETIC) 50-25 MG TABLET    Take 1 tablet by mouth daily.     SIGNIFICANT DIAGNOSTIC EXAMS    LABS REVIEWED PREVIOUS:   11-17-18: wbc 6.7; hgb 8.1; hct 24.8; mcv 97.3; plt 195; glucose 154; bun 29; creat 1.26; k+ 4.1; na++ 132; ca 8.0 mag 2.0  11-25-18: wbc 9.8; hgb 9.0 hct 27.5; mcv 99.3; plt 494; glucose 136; bun 27; creat 1.16; k+ 3.4; na++ 136; ca 8.7   NO NEW LABS.   Review of Systems  Constitutional:  Negative for malaise/fatigue.  Respiratory: Negative for cough and shortness of breath.   Cardiovascular: Negative for chest pain, palpitations and leg swelling.  Gastrointestinal: Negative for abdominal pain, constipation and heartburn.  Musculoskeletal: Negative for back pain, joint pain and myalgias.  Skin: Negative.   Neurological: Negative for dizziness.  Psychiatric/Behavioral: The patient is not nervous/anxious.     Physical Exam Constitutional:      General: He is not in acute distress.    Appearance: He is well-developed. He is obese. He is not diaphoretic.  Neck:     Musculoskeletal: Neck supple.     Thyroid: No thyromegaly.  Cardiovascular:     Rate and Rhythm: Normal rate and regular rhythm.     Pulses: Normal pulses.     Heart sounds: Normal heart sounds.  Pulmonary:     Effort: Pulmonary effort is normal. No respiratory distress.     Breath sounds: Normal breath sounds.  Abdominal:     General: Bowel sounds are normal. There is no distension.     Palpations: Abdomen is soft.     Tenderness: There is no abdominal tenderness.  Musculoskeletal:     Right lower leg: Edema present.     Left lower leg: No edema.  Comments: Is status post right knee replacement Is able to move all extremities Has trace right lower extremity edema   Lymphadenopathy:     Cervical: No cervical adenopathy.  Skin:    General: Skin is warm and dry.     Comments: Right knee incision line without signs of infection present.   Neurological:     Mental Status: He is alert and oriented to person, place, and time.  Psychiatric:        Mood and Affect: Mood normal.      ASSESSMENT/ PLAN:   Patient is being discharged with the following home health services: pt/ot: to evaluate and treat as indicated for gait balance strength adl training    Patient is being discharged with the following durable medical equipment: 3:1 commode; front wheel walker to allow him to maintain his current  level of independence with his adls    Patient has been advised to f/u with their PCP in 1-2 weeks to bring them up to date on their rehab stay.  Social services at facility was responsible for arranging this appointment.  Pt was provided with a 30 day supply of prescriptions for medications and refills must be obtained from their PCP.  For controlled substances, a more limited supply may be provided adequate until PCP appointment only.   A 30 day supply of his current medications have been sent to [facility] on [address] with #20 oxycodone 5 mg tabs.   Time spent with patient: 45 minutes: discussed medications; home health needs and dme; verbalized understanding.   His k+ has returned at 2.7 will give 40 meq X 2 doses and add RN to home health to evaluate and treat as indicated for medication management and k+ follow up will need  K+ level drawn in AM to follow up with his PCP   Ok Edwards NP [facility]  Contact [phone] Monday through Friday 8am- 5pm  After hours call [phone]

## 2018-12-07 ENCOUNTER — Other Ambulatory Visit
Admission: RE | Admit: 2018-12-07 | Discharge: 2018-12-07 | Disposition: A | Discharge status: Home / Self Care | Payer: Medicare HMO | Source: Ambulatory Visit | Attending: Adult Health | Admitting: Adult Health

## 2018-12-07 DIAGNOSIS — N183 Chronic kidney disease, stage 3 (moderate): Secondary | ICD-10-CM | POA: Insufficient documentation

## 2018-12-07 LAB — BASIC METABOLIC PANEL
Anion gap: 10 (ref 5–15)
BUN: 19 mg/dL (ref 8–23)
CO2: 28 mmol/L (ref 22–32)
Calcium: 8.6 mg/dL — ABNORMAL LOW (ref 8.9–10.3)
Chloride: 99 mmol/L (ref 98–111)
Creatinine, Ser: 1.15 mg/dL (ref 0.61–1.24)
GFR calc Af Amer: >60 mL/min (ref >60)
GFR calc non Af Amer: >60 mL/min (ref >60)
Glucose, Bld: 123 mg/dL — ABNORMAL HIGH (ref 70–99)
Potassium: 2.7 mmol/L — CL (ref 3.5–5.1)
Sodium: 137 mmol/L (ref 135–145)

## 2018-12-08 DIAGNOSIS — M5136 Other intervertebral disc degeneration, lumbar region: Secondary | ICD-10-CM | POA: Diagnosis not present

## 2018-12-08 DIAGNOSIS — E1122 Type 2 diabetes mellitus with diabetic chronic kidney disease: Secondary | ICD-10-CM | POA: Diagnosis not present

## 2018-12-08 DIAGNOSIS — N183 Chronic kidney disease, stage 3 (moderate): Secondary | ICD-10-CM | POA: Diagnosis not present

## 2018-12-08 DIAGNOSIS — E876 Hypokalemia: Secondary | ICD-10-CM | POA: Diagnosis not present

## 2018-12-08 DIAGNOSIS — D500 Iron deficiency anemia secondary to blood loss (chronic): Secondary | ICD-10-CM | POA: Diagnosis not present

## 2018-12-08 DIAGNOSIS — I129 Hypertensive chronic kidney disease with stage 1 through stage 4 chronic kidney disease, or unspecified chronic kidney disease: Secondary | ICD-10-CM | POA: Diagnosis not present

## 2018-12-08 DIAGNOSIS — K5730 Diverticulosis of large intestine without perforation or abscess without bleeding: Secondary | ICD-10-CM | POA: Diagnosis not present

## 2018-12-08 DIAGNOSIS — E1140 Type 2 diabetes mellitus with diabetic neuropathy, unspecified: Secondary | ICD-10-CM | POA: Diagnosis not present

## 2018-12-08 DIAGNOSIS — I2510 Atherosclerotic heart disease of native coronary artery without angina pectoris: Secondary | ICD-10-CM | POA: Diagnosis not present

## 2018-12-08 DIAGNOSIS — Z471 Aftercare following joint replacement surgery: Secondary | ICD-10-CM | POA: Diagnosis not present

## 2018-12-09 DIAGNOSIS — I2510 Atherosclerotic heart disease of native coronary artery without angina pectoris: Secondary | ICD-10-CM | POA: Diagnosis not present

## 2018-12-09 DIAGNOSIS — K5730 Diverticulosis of large intestine without perforation or abscess without bleeding: Secondary | ICD-10-CM | POA: Diagnosis not present

## 2018-12-09 DIAGNOSIS — D500 Iron deficiency anemia secondary to blood loss (chronic): Secondary | ICD-10-CM | POA: Diagnosis not present

## 2018-12-09 DIAGNOSIS — M5136 Other intervertebral disc degeneration, lumbar region: Secondary | ICD-10-CM | POA: Diagnosis not present

## 2018-12-09 DIAGNOSIS — E1122 Type 2 diabetes mellitus with diabetic chronic kidney disease: Secondary | ICD-10-CM | POA: Diagnosis not present

## 2018-12-09 DIAGNOSIS — N183 Chronic kidney disease, stage 3 (moderate): Secondary | ICD-10-CM | POA: Diagnosis not present

## 2018-12-09 DIAGNOSIS — E1140 Type 2 diabetes mellitus with diabetic neuropathy, unspecified: Secondary | ICD-10-CM | POA: Diagnosis not present

## 2018-12-09 DIAGNOSIS — Z471 Aftercare following joint replacement surgery: Secondary | ICD-10-CM | POA: Diagnosis not present

## 2018-12-09 DIAGNOSIS — I129 Hypertensive chronic kidney disease with stage 1 through stage 4 chronic kidney disease, or unspecified chronic kidney disease: Secondary | ICD-10-CM | POA: Diagnosis not present

## 2018-12-10 ENCOUNTER — Other Ambulatory Visit: Payer: Self-pay

## 2018-12-10 NOTE — Patient Outreach (Signed)
[facility]) Care Management  12/10/2018  Thomas Carlson 05/11/1946 [id number]   Referral Date: 12/10/2018 Referral Source: [employer] Report Date of Admission: 11/17/2018 Diagnosis:  Left knee replacement Date of Discharge: 12/07/2018 Facility:  [place]: [employer]  Outreach attempt: Spoke with patient.  He is able to verify HIPAA.  He states that he is doing ok since discharge. He states that he has some pain but is getting around with walker with 2 wheels.  Patient states he lives with his wife and she is available to assist as needed.  Patient states he is independent with care and was able to fix himself something for breakfast this morning.  Patient able to review medications.  He states there was some question about him taking potassium but he states that [place] has been several times and has drawn blood and will be following up.  Patient states that he has an appointment with his PCP soon and that his wife keeps up with appointments.  He states that he has no problems with transportation.    Patient with recent right knee replacement due to osteoarthritis with rehab stay at [facility].  Patient also with history of   DM, HTN, CKD-3, obstructive sleep apnea, and CAD per medical record.  Patient declined any medical questions or concerns.    Discussed [facility] services with patient.  He declined any need presently but is agreeable to receive letter and brochure for future reference.    Plan: RN CM will   Jone Baseman, RN, MSN [place] [facility] Care Management Coordinator Direct Line [phone] Toll Free: [phone]  Fax: [phone]

## 2018-12-11 DIAGNOSIS — Z471 Aftercare following joint replacement surgery: Secondary | ICD-10-CM | POA: Diagnosis not present

## 2018-12-11 DIAGNOSIS — E1122 Type 2 diabetes mellitus with diabetic chronic kidney disease: Secondary | ICD-10-CM | POA: Diagnosis not present

## 2018-12-11 DIAGNOSIS — D500 Iron deficiency anemia secondary to blood loss (chronic): Secondary | ICD-10-CM | POA: Diagnosis not present

## 2018-12-11 DIAGNOSIS — I129 Hypertensive chronic kidney disease with stage 1 through stage 4 chronic kidney disease, or unspecified chronic kidney disease: Secondary | ICD-10-CM | POA: Diagnosis not present

## 2018-12-11 DIAGNOSIS — K5730 Diverticulosis of large intestine without perforation or abscess without bleeding: Secondary | ICD-10-CM | POA: Diagnosis not present

## 2018-12-11 DIAGNOSIS — E1140 Type 2 diabetes mellitus with diabetic neuropathy, unspecified: Secondary | ICD-10-CM | POA: Diagnosis not present

## 2018-12-11 DIAGNOSIS — I2510 Atherosclerotic heart disease of native coronary artery without angina pectoris: Secondary | ICD-10-CM | POA: Diagnosis not present

## 2018-12-11 DIAGNOSIS — N183 Chronic kidney disease, stage 3 (moderate): Secondary | ICD-10-CM | POA: Diagnosis not present

## 2018-12-11 DIAGNOSIS — M5136 Other intervertebral disc degeneration, lumbar region: Secondary | ICD-10-CM | POA: Diagnosis not present

## 2018-12-14 DIAGNOSIS — Z471 Aftercare following joint replacement surgery: Secondary | ICD-10-CM | POA: Diagnosis not present

## 2018-12-14 DIAGNOSIS — M5136 Other intervertebral disc degeneration, lumbar region: Secondary | ICD-10-CM | POA: Diagnosis not present

## 2018-12-14 DIAGNOSIS — D500 Iron deficiency anemia secondary to blood loss (chronic): Secondary | ICD-10-CM | POA: Diagnosis not present

## 2018-12-14 DIAGNOSIS — I129 Hypertensive chronic kidney disease with stage 1 through stage 4 chronic kidney disease, or unspecified chronic kidney disease: Secondary | ICD-10-CM | POA: Diagnosis not present

## 2018-12-14 DIAGNOSIS — E1122 Type 2 diabetes mellitus with diabetic chronic kidney disease: Secondary | ICD-10-CM | POA: Diagnosis not present

## 2018-12-14 DIAGNOSIS — I2510 Atherosclerotic heart disease of native coronary artery without angina pectoris: Secondary | ICD-10-CM | POA: Diagnosis not present

## 2018-12-14 DIAGNOSIS — E1140 Type 2 diabetes mellitus with diabetic neuropathy, unspecified: Secondary | ICD-10-CM | POA: Diagnosis not present

## 2018-12-14 DIAGNOSIS — R29898 Other symptoms and signs involving the musculoskeletal system: Secondary | ICD-10-CM | POA: Diagnosis not present

## 2018-12-14 DIAGNOSIS — K5730 Diverticulosis of large intestine without perforation or abscess without bleeding: Secondary | ICD-10-CM | POA: Diagnosis not present

## 2018-12-14 DIAGNOSIS — N183 Chronic kidney disease, stage 3 (moderate): Secondary | ICD-10-CM | POA: Diagnosis not present

## 2018-12-14 DIAGNOSIS — R270 Ataxia, unspecified: Secondary | ICD-10-CM | POA: Diagnosis not present

## 2018-12-15 DIAGNOSIS — E1140 Type 2 diabetes mellitus with diabetic neuropathy, unspecified: Secondary | ICD-10-CM | POA: Diagnosis not present

## 2018-12-15 DIAGNOSIS — M5136 Other intervertebral disc degeneration, lumbar region: Secondary | ICD-10-CM | POA: Diagnosis not present

## 2018-12-15 DIAGNOSIS — I2510 Atherosclerotic heart disease of native coronary artery without angina pectoris: Secondary | ICD-10-CM | POA: Diagnosis not present

## 2018-12-15 DIAGNOSIS — K5730 Diverticulosis of large intestine without perforation or abscess without bleeding: Secondary | ICD-10-CM | POA: Diagnosis not present

## 2018-12-15 DIAGNOSIS — E1122 Type 2 diabetes mellitus with diabetic chronic kidney disease: Secondary | ICD-10-CM | POA: Diagnosis not present

## 2018-12-15 DIAGNOSIS — N183 Chronic kidney disease, stage 3 (moderate): Secondary | ICD-10-CM | POA: Diagnosis not present

## 2018-12-15 DIAGNOSIS — D500 Iron deficiency anemia secondary to blood loss (chronic): Secondary | ICD-10-CM | POA: Diagnosis not present

## 2018-12-15 DIAGNOSIS — Z471 Aftercare following joint replacement surgery: Secondary | ICD-10-CM | POA: Diagnosis not present

## 2018-12-15 DIAGNOSIS — I129 Hypertensive chronic kidney disease with stage 1 through stage 4 chronic kidney disease, or unspecified chronic kidney disease: Secondary | ICD-10-CM | POA: Diagnosis not present

## 2018-12-16 DIAGNOSIS — I129 Hypertensive chronic kidney disease with stage 1 through stage 4 chronic kidney disease, or unspecified chronic kidney disease: Secondary | ICD-10-CM | POA: Diagnosis not present

## 2018-12-16 DIAGNOSIS — N183 Chronic kidney disease, stage 3 (moderate): Secondary | ICD-10-CM | POA: Diagnosis not present

## 2018-12-16 DIAGNOSIS — E1140 Type 2 diabetes mellitus with diabetic neuropathy, unspecified: Secondary | ICD-10-CM | POA: Diagnosis not present

## 2018-12-16 DIAGNOSIS — M5136 Other intervertebral disc degeneration, lumbar region: Secondary | ICD-10-CM | POA: Diagnosis not present

## 2018-12-16 DIAGNOSIS — E1122 Type 2 diabetes mellitus with diabetic chronic kidney disease: Secondary | ICD-10-CM | POA: Diagnosis not present

## 2018-12-16 DIAGNOSIS — D500 Iron deficiency anemia secondary to blood loss (chronic): Secondary | ICD-10-CM | POA: Diagnosis not present

## 2018-12-16 DIAGNOSIS — I2510 Atherosclerotic heart disease of native coronary artery without angina pectoris: Secondary | ICD-10-CM | POA: Diagnosis not present

## 2018-12-16 DIAGNOSIS — Z471 Aftercare following joint replacement surgery: Secondary | ICD-10-CM | POA: Diagnosis not present

## 2018-12-16 DIAGNOSIS — K5730 Diverticulosis of large intestine without perforation or abscess without bleeding: Secondary | ICD-10-CM | POA: Diagnosis not present

## 2018-12-17 ENCOUNTER — Other Ambulatory Visit: Payer: Self-pay | Admitting: Neurology

## 2018-12-17 DIAGNOSIS — M5136 Other intervertebral disc degeneration, lumbar region: Secondary | ICD-10-CM | POA: Diagnosis not present

## 2018-12-17 DIAGNOSIS — R270 Ataxia, unspecified: Secondary | ICD-10-CM | POA: Insufficient documentation

## 2018-12-17 DIAGNOSIS — D500 Iron deficiency anemia secondary to blood loss (chronic): Secondary | ICD-10-CM | POA: Diagnosis not present

## 2018-12-17 DIAGNOSIS — K5730 Diverticulosis of large intestine without perforation or abscess without bleeding: Secondary | ICD-10-CM | POA: Diagnosis not present

## 2018-12-17 DIAGNOSIS — I639 Cerebral infarction, unspecified: Secondary | ICD-10-CM

## 2018-12-17 DIAGNOSIS — R29898 Other symptoms and signs involving the musculoskeletal system: Secondary | ICD-10-CM

## 2018-12-17 DIAGNOSIS — Z471 Aftercare following joint replacement surgery: Secondary | ICD-10-CM | POA: Diagnosis not present

## 2018-12-17 DIAGNOSIS — E1140 Type 2 diabetes mellitus with diabetic neuropathy, unspecified: Secondary | ICD-10-CM | POA: Diagnosis not present

## 2018-12-17 DIAGNOSIS — I2510 Atherosclerotic heart disease of native coronary artery without angina pectoris: Secondary | ICD-10-CM | POA: Diagnosis not present

## 2018-12-17 DIAGNOSIS — N183 Chronic kidney disease, stage 3 (moderate): Secondary | ICD-10-CM | POA: Diagnosis not present

## 2018-12-17 DIAGNOSIS — E1122 Type 2 diabetes mellitus with diabetic chronic kidney disease: Secondary | ICD-10-CM | POA: Diagnosis not present

## 2018-12-17 DIAGNOSIS — I129 Hypertensive chronic kidney disease with stage 1 through stage 4 chronic kidney disease, or unspecified chronic kidney disease: Secondary | ICD-10-CM | POA: Diagnosis not present

## 2018-12-18 DIAGNOSIS — I2510 Atherosclerotic heart disease of native coronary artery without angina pectoris: Secondary | ICD-10-CM | POA: Diagnosis not present

## 2018-12-18 DIAGNOSIS — E1140 Type 2 diabetes mellitus with diabetic neuropathy, unspecified: Secondary | ICD-10-CM | POA: Diagnosis not present

## 2018-12-18 DIAGNOSIS — Z471 Aftercare following joint replacement surgery: Secondary | ICD-10-CM | POA: Diagnosis not present

## 2018-12-18 DIAGNOSIS — N183 Chronic kidney disease, stage 3 (moderate): Secondary | ICD-10-CM | POA: Diagnosis not present

## 2018-12-18 DIAGNOSIS — E1122 Type 2 diabetes mellitus with diabetic chronic kidney disease: Secondary | ICD-10-CM | POA: Diagnosis not present

## 2018-12-18 DIAGNOSIS — I129 Hypertensive chronic kidney disease with stage 1 through stage 4 chronic kidney disease, or unspecified chronic kidney disease: Secondary | ICD-10-CM | POA: Diagnosis not present

## 2018-12-18 DIAGNOSIS — D500 Iron deficiency anemia secondary to blood loss (chronic): Secondary | ICD-10-CM | POA: Diagnosis not present

## 2018-12-18 DIAGNOSIS — M5136 Other intervertebral disc degeneration, lumbar region: Secondary | ICD-10-CM | POA: Diagnosis not present

## 2018-12-18 DIAGNOSIS — K5730 Diverticulosis of large intestine without perforation or abscess without bleeding: Secondary | ICD-10-CM | POA: Diagnosis not present

## 2018-12-21 DIAGNOSIS — D500 Iron deficiency anemia secondary to blood loss (chronic): Secondary | ICD-10-CM | POA: Diagnosis not present

## 2018-12-21 DIAGNOSIS — I2510 Atherosclerotic heart disease of native coronary artery without angina pectoris: Secondary | ICD-10-CM | POA: Diagnosis not present

## 2018-12-21 DIAGNOSIS — K5730 Diverticulosis of large intestine without perforation or abscess without bleeding: Secondary | ICD-10-CM | POA: Diagnosis not present

## 2018-12-21 DIAGNOSIS — Z471 Aftercare following joint replacement surgery: Secondary | ICD-10-CM | POA: Diagnosis not present

## 2018-12-21 DIAGNOSIS — I129 Hypertensive chronic kidney disease with stage 1 through stage 4 chronic kidney disease, or unspecified chronic kidney disease: Secondary | ICD-10-CM | POA: Diagnosis not present

## 2018-12-21 DIAGNOSIS — E1140 Type 2 diabetes mellitus with diabetic neuropathy, unspecified: Secondary | ICD-10-CM | POA: Diagnosis not present

## 2018-12-21 DIAGNOSIS — N183 Chronic kidney disease, stage 3 (moderate): Secondary | ICD-10-CM | POA: Diagnosis not present

## 2018-12-21 DIAGNOSIS — E1122 Type 2 diabetes mellitus with diabetic chronic kidney disease: Secondary | ICD-10-CM | POA: Diagnosis not present

## 2018-12-21 DIAGNOSIS — M5136 Other intervertebral disc degeneration, lumbar region: Secondary | ICD-10-CM | POA: Diagnosis not present

## 2018-12-23 DIAGNOSIS — Z96651 Presence of right artificial knee joint: Secondary | ICD-10-CM | POA: Diagnosis not present

## 2018-12-23 DIAGNOSIS — Z96659 Presence of unspecified artificial knee joint: Secondary | ICD-10-CM | POA: Diagnosis not present

## 2018-12-24 ENCOUNTER — Encounter: Payer: Self-pay | Admitting: Family Medicine

## 2018-12-24 ENCOUNTER — Ambulatory Visit: Payer: Medicare HMO | Admitting: Urology

## 2018-12-24 ENCOUNTER — Ambulatory Visit
Admission: RE | Admit: 2018-12-24 | Discharge: 2018-12-24 | Disposition: A | Discharge status: Home / Self Care | Payer: Medicare HMO | Source: Ambulatory Visit | Attending: Neurology | Admitting: Neurology

## 2018-12-24 DIAGNOSIS — E1140 Type 2 diabetes mellitus with diabetic neuropathy, unspecified: Secondary | ICD-10-CM | POA: Diagnosis not present

## 2018-12-24 DIAGNOSIS — R29898 Other symptoms and signs involving the musculoskeletal system: Secondary | ICD-10-CM | POA: Diagnosis not present

## 2018-12-24 DIAGNOSIS — R531 Weakness: Secondary | ICD-10-CM | POA: Diagnosis not present

## 2018-12-24 DIAGNOSIS — E1122 Type 2 diabetes mellitus with diabetic chronic kidney disease: Secondary | ICD-10-CM | POA: Diagnosis not present

## 2018-12-24 DIAGNOSIS — I639 Cerebral infarction, unspecified: Secondary | ICD-10-CM | POA: Diagnosis not present

## 2018-12-24 DIAGNOSIS — Z471 Aftercare following joint replacement surgery: Secondary | ICD-10-CM | POA: Diagnosis not present

## 2018-12-24 DIAGNOSIS — N183 Chronic kidney disease, stage 3 (moderate): Secondary | ICD-10-CM | POA: Diagnosis not present

## 2018-12-24 DIAGNOSIS — I129 Hypertensive chronic kidney disease with stage 1 through stage 4 chronic kidney disease, or unspecified chronic kidney disease: Secondary | ICD-10-CM | POA: Diagnosis not present

## 2018-12-24 DIAGNOSIS — M5136 Other intervertebral disc degeneration, lumbar region: Secondary | ICD-10-CM | POA: Diagnosis not present

## 2018-12-24 DIAGNOSIS — I2510 Atherosclerotic heart disease of native coronary artery without angina pectoris: Secondary | ICD-10-CM | POA: Diagnosis not present

## 2018-12-24 DIAGNOSIS — D500 Iron deficiency anemia secondary to blood loss (chronic): Secondary | ICD-10-CM | POA: Diagnosis not present

## 2018-12-24 DIAGNOSIS — K5730 Diverticulosis of large intestine without perforation or abscess without bleeding: Secondary | ICD-10-CM | POA: Diagnosis not present

## 2018-12-24 NOTE — Progress Notes (Signed)
BP 124/70 (BP Location: Left Arm, Patient Position: Sitting, Cuff Size: Normal)   Pulse 78   Temp 98.4 F (36.9 C) (Oral)   Ht 5' 7.5" (1.715 m)   Wt 235 lb 5 oz (106.7 kg)   SpO2 96%   BMI 36.31 kg/m    CC: f/u knee surgery Subjective:    Patient ID: Thomas Carlson, male    DOB: December 13, 1945, 73 y.o.   MRN: [id number]  HPI: Thomas Carlson is a 73 y.o. male presenting on 12/25/2018 for Follow-up (Here for right knee surgery f/u. Had surgery 11/12/18. ) and Discuss Medications   Recent R knee replacement by Dr Mack Guise, had fatigue/weakness during hospitalization as well as urinary retention treated with foley and flomax. Stayed in rehab for 2 weeks, discharged 12/04/2018. Accupril was held due to orthostatic hypotension. Treated for cellulitis with doxy. Had hypokalemia - apparently had not been taking his potassium replacement despite continuing chlorthalidone. Now only taking atenolol/chlorthalidone.   HH completed yesterday. To start outpatient PT next week. He is currently not taking flomax, aspirin, or colace. Not really taking oxycodone.   Lymphocytic colitis - on budesonide 9mg  daily ([place]). Had 2/3 hemorrhoidal banding ([place]).  Saw neurology Melrose Nakayama) for ongoing leg weakness - had MRI done yesterday r/o CVA - no signs of stroke. Pending NCS and labwork for mysathenia.  Increasing bruising noted - saw dermatology for this, told due to thin skin.   Insomnia - melatonin and trazodone haven't helped. Now taking OTC sleep aid.  Will be due for physical.       Relevant past medical, surgical, family and social history reviewed and updated as indicated. Interim medical history since our last visit reviewed. Allergies and medications reviewed and updated. Outpatient Medications Prior to Visit  Medication Sig Dispense Refill  . Acetaminophen 500 MG coapsule Take 1,000 mg by mouth 3 (three) times daily.     Marland Kitchen acyclovir (ZOVIRAX) 400 MG tablet Take 1 tablet (400 mg total) by  mouth 2 (two) times daily. 60 tablet 0  . allopurinol (ZYLOPRIM) 100 MG tablet Take 1 tablet (100 mg total) by mouth daily. 30 tablet 0  . b complex vitamins tablet Take 1 tablet by mouth daily.    . Biotin 1000 MCG tablet Take 1,000 mcg by mouth daily.     . budesonide (ENTOCORT EC) 3 MG 24 hr capsule Take 3 capsules (9 mg total) by mouth daily. 90 capsule 0  . cholecalciferol (VITAMIN D3) 25 MCG (1000 UT) tablet Take 1,000 Units by mouth daily.     Marland Kitchen lovastatin (MEVACOR) 40 MG tablet Take 2 tablets (80 mg total) by mouth at bedtime. 60 tablet 0  . Magnesium 500 MG TABS Take 500 mg by mouth daily.    . metFORMIN (GLUCOPHAGE) 500 MG tablet Take 1 tablet (500 mg total) by mouth 3 (three) times daily before meals. 90 tablet 0  . Multiple Vitamin (MULTIVITAMIN) tablet Take 1 tablet by mouth daily.    . NON FORMULARY Diet Type:  Regular consistency, NCS    . oxyCODONE (OXY IR/ROXICODONE) 5 MG immediate release tablet Takes as needed    . pantoprazole (PROTONIX) 40 MG tablet Take 1 tablet (40 mg total) by mouth daily. 30 tablet 0  . potassium chloride SA (K-DUR,KLOR-CON) 20 MEQ tablet Take 2 tablets (40 mEq total) by mouth daily.    . vitamin C (ASCORBIC ACID) 500 MG tablet Take 500 mg by mouth daily.    Marland Kitchen aspirin EC 325 MG  EC tablet Take 1 tablet (325 mg total) by mouth 2 (two) times daily. 30 tablet 0  . atenolol (TENORMIN) 25 MG tablet Take 1 tablet (25 mg total) by mouth at bedtime. 30 tablet 0  . bisacodyl (DULCOLAX) 10 MG suppository Place 1 suppository (10 mg total) rectally daily as needed for moderate constipation. 12 suppository 0  . chlorthalidone (HYGROTON) 50 MG tablet Take 1 tablet (50 mg total) by mouth daily. 30 tablet 0  . docusate sodium (COLACE) 100 MG capsule Take 1 capsule (100 mg total) by mouth 2 (two) times daily. 10 capsule 0  . Melatonin 3 MG TABS Take 1 tablet by mouth at bedtime.    . potassium chloride SA (K-DUR,KLOR-CON) 20 MEQ tablet Take 20 mEq by mouth 2 (two) times  daily.    . tamsulosin (FLOMAX) 0.4 MG CAPS capsule Take 1 capsule (0.4 mg total) by mouth at bedtime. 30 capsule 0  . traZODone (DESYREL) 50 MG tablet Take 0.5 tablets (25 mg total) by mouth at bedtime. 15 tablet 0  . atenolol-chlorthalidone (TENORETIC) 50-25 MG tablet Take 1 tablet by mouth daily.     No facility-administered medications prior to visit.      Per HPI unless specifically indicated in ROS section below Review of Systems Objective:    BP 124/70 (BP Location: Left Arm, Patient Position: Sitting, Cuff Size: Normal)   Pulse 78   Temp 98.4 F (36.9 C) (Oral)   Ht 5' 7.5" (1.715 m)   Wt 235 lb 5 oz (106.7 kg)   SpO2 96%   BMI 36.31 kg/m   Wt Readings from Last 3 Encounters:  12/25/18 235 lb 5 oz (106.7 kg)  12/04/18 237 lb 1.6 oz (107.5 kg)  12/02/18 237 lb 1.6 oz (107.5 kg)    Physical Exam Vitals signs and nursing note reviewed.  Constitutional:      Appearance: Normal appearance. He is not ill-appearing.     Comments: Walks with walker today  HENT:     Mouth/Throat:     Mouth: Mucous membranes are moist.  Eyes:     Extraocular Movements: Extraocular movements intact.     Pupils: Pupils are equal, round, and reactive to light.  Cardiovascular:     Rate and Rhythm: Normal rate and regular rhythm.     Pulses: Normal pulses.     Heart sounds: Murmur (3/6 systolic) present.  Pulmonary:     Effort: Pulmonary effort is normal. No respiratory distress.     Breath sounds: Normal breath sounds. No wheezing, rhonchi or rales.  Musculoskeletal:        General: Swelling present.     Right lower leg: Edema (1+) present.     Left lower leg: No edema.     Comments: Incision without erythema/induration  Neurological:     Mental Status: He is alert.  Psychiatric:        Mood and Affect: Mood normal.       Results for orders placed or performed in visit on 12/25/18  CBC with Differential/Platelet  Result Value Ref Range   WBC 8.3 4.0 - 10.5 K/uL   RBC 3.72 (L)  4.22 - 5.81 Mil/uL   Hemoglobin 12.7 (L) 13.0 - 17.0 g/dL   HCT 37.9 (L) 39.0 - 52.0 %   MCV 101.9 (H) 78.0 - 100.0 fl   MCHC 33.5 30.0 - 36.0 g/dL   RDW 18.5 (H) 11.5 - 15.5 %   Platelets 252.0 150.0 - 400.0 K/uL   Neutrophils Relative %  77.0 43.0 - 77.0 %   Lymphocytes Relative 14.3 12.0 - 46.0 %   Monocytes Relative 7.5 3.0 - 12.0 %   Eosinophils Relative 0.7 0.0 - 5.0 %   Basophils Relative 0.5 0.0 - 3.0 %   Neutro Abs 6.4 1.4 - 7.7 K/uL   Lymphs Abs 1.2 0.7 - 4.0 K/uL   Monocytes Absolute 0.6 0.1 - 1.0 K/uL   Eosinophils Absolute 0.1 0.0 - 0.7 K/uL   Basophils Absolute 0.0 0.0 - 0.1 K/uL  Vitamin B12  Result Value Ref Range   Vitamin B-12 208 (L) 211 - 911 pg/mL  Magnesium  Result Value Ref Range   Magnesium 1.6 1.5 - 2.5 mg/dL  Renal function panel  Result Value Ref Range   Sodium 143 135 - 145 mEq/L   Potassium 3.9 3.5 - 5.1 mEq/L   Chloride 106 96 - 112 mEq/L   CO2 25 19 - 32 mEq/L   Calcium 9.1 8.4 - 10.5 mg/dL   Albumin 3.9 3.5 - 5.2 g/dL   BUN 34 (H) 6 - 23 mg/dL   Creatinine, Ser 1.40 0.40 - 1.50 mg/dL   Glucose, Bld 120 (H) 70 - 99 mg/dL   Phosphorus 3.0 2.3 - 4.6 mg/dL   GFR 49.59 (L) >60.00 mL/min   Lab Results  Component Value Date   HGBA1C 6.5 10/12/2018    Assessment & Plan:   Problem List Items Addressed This Visit    Weakness of both lower extremities - Primary    Established with neuro pending evaluation. Recent MRI reassuring. Appreciate neuro care.       Vitamin B12 deficiency    He received 3 B12 IM injections in last 3 months. Will check vitamin B12 and give injection today. Will need monthly injections.       Relevant Medications   cyanocobalamin ((VITAMIN B-12)) injection 1,000 mcg (Completed)   Other Relevant Orders   Vitamin B12 (Completed)   Total knee replacement status, right   Relevant Orders   CBC with Differential/Platelet (Completed)   Lymphocytic colitis    Sees GI. On budesonide 9mg  daily.       Hypertension  associated with type 2 diabetes mellitus (HCC)    Chronic, stable on current regimen. He states he's off ACEI and only on combo atenolol/chlorthalidone. Will continue to monitor off ACEI.       Relevant Medications   atenolol-chlorthalidone (TENORETIC) 50-25 MG tablet   Edema of both feet    This has improved, now with expected R pedal edema after knee replacement.       DJD (degenerative joint disease)   Relevant Medications   oxyCODONE (OXY IR/ROXICODONE) 5 MG immediate release tablet   CKD stage 3 due to type 2 diabetes mellitus ([place])    Update Cr today.       Relevant Medications   atenolol-chlorthalidone (TENORETIC) 50-25 MG tablet   Other Relevant Orders   Renal function panel (Completed)    Other Visit Diagnoses    Hypokalemia       Relevant Orders   Magnesium (Completed)   Renal function panel (Completed)       Meds ordered this encounter  Medications  . cyanocobalamin ((VITAMIN B-12)) injection 1,000 mcg   Orders Placed This Encounter  Procedures  . CBC with Differential/Platelet  . Vitamin B12  . Magnesium  . Renal function panel    Patient Instructions  Labs today.  Continue current medicines with updated medicine list.  Return at your convenience for physical.  B12 shot today   Follow up plan: No follow-ups on file.  Ria Bush, MD

## 2018-12-25 ENCOUNTER — Ambulatory Visit (INDEPENDENT_AMBULATORY_CARE_PROVIDER_SITE_OTHER): Payer: Medicare HMO | Admitting: Family Medicine

## 2018-12-25 ENCOUNTER — Encounter: Payer: Self-pay | Admitting: Family Medicine

## 2018-12-25 VITALS — BP 124/70 | HR 78 | Temp 98.4°F | Ht 67.5 in | Wt 235.3 lb

## 2018-12-25 DIAGNOSIS — E1159 Type 2 diabetes mellitus with other circulatory complications: Secondary | ICD-10-CM

## 2018-12-25 DIAGNOSIS — I152 Hypertension secondary to endocrine disorders: Secondary | ICD-10-CM

## 2018-12-25 DIAGNOSIS — E876 Hypokalemia: Secondary | ICD-10-CM | POA: Diagnosis not present

## 2018-12-25 DIAGNOSIS — E1122 Type 2 diabetes mellitus with diabetic chronic kidney disease: Secondary | ICD-10-CM | POA: Diagnosis not present

## 2018-12-25 DIAGNOSIS — K52832 Lymphocytic colitis: Secondary | ICD-10-CM | POA: Diagnosis not present

## 2018-12-25 DIAGNOSIS — M170 Bilateral primary osteoarthritis of knee: Secondary | ICD-10-CM

## 2018-12-25 DIAGNOSIS — E538 Deficiency of other specified B group vitamins: Secondary | ICD-10-CM | POA: Diagnosis not present

## 2018-12-25 DIAGNOSIS — Z96651 Presence of right artificial knee joint: Secondary | ICD-10-CM

## 2018-12-25 DIAGNOSIS — R29898 Other symptoms and signs involving the musculoskeletal system: Secondary | ICD-10-CM | POA: Diagnosis not present

## 2018-12-25 DIAGNOSIS — R600 Localized edema: Secondary | ICD-10-CM

## 2018-12-25 DIAGNOSIS — N183 Chronic kidney disease, stage 3 (moderate): Secondary | ICD-10-CM | POA: Diagnosis not present

## 2018-12-25 DIAGNOSIS — I10 Essential (primary) hypertension: Secondary | ICD-10-CM

## 2018-12-25 LAB — CBC WITH DIFFERENTIAL/PLATELET
Basophils Absolute: 0 10*3/uL (ref 0.0–0.1)
Basophils Relative: 0.5 % (ref 0.0–3.0)
Eosinophils Absolute: 0.1 10*3/uL (ref 0.0–0.7)
Eosinophils Relative: 0.7 % (ref 0.0–5.0)
HEMATOCRIT: 37.9 % — AB (ref 39.0–52.0)
Hemoglobin: 12.7 g/dL — ABNORMAL LOW (ref 13.0–17.0)
Lymphocytes Relative: 14.3 % (ref 12.0–46.0)
Lymphs Abs: 1.2 10*3/uL (ref 0.7–4.0)
MCHC: 33.5 g/dL (ref 30.0–36.0)
MCV: 101.9 fl — ABNORMAL HIGH (ref 78.0–100.0)
Monocytes Absolute: 0.6 10*3/uL (ref 0.1–1.0)
Monocytes Relative: 7.5 % (ref 3.0–12.0)
NEUTROS ABS: 6.4 10*3/uL (ref 1.4–7.7)
Neutrophils Relative %: 77 % (ref 43.0–77.0)
PLATELETS: 252 10*3/uL (ref 150.0–400.0)
RBC: 3.72 Mil/uL — ABNORMAL LOW (ref 4.22–5.81)
RDW: 18.5 % — ABNORMAL HIGH (ref 11.5–15.5)
WBC: 8.3 10*3/uL (ref 4.0–10.5)

## 2018-12-25 LAB — RENAL FUNCTION PANEL
Albumin: 3.9 g/dL (ref 3.5–5.2)
BUN: 34 mg/dL — ABNORMAL HIGH (ref 6–23)
CO2: 25 mEq/L (ref 19–32)
Calcium: 9.1 mg/dL (ref 8.4–10.5)
Chloride: 106 mEq/L (ref 96–112)
Creatinine, Ser: 1.4 mg/dL (ref 0.40–1.50)
GFR: 49.59 mL/min — ABNORMAL LOW (ref >60.00)
Glucose, Bld: 120 mg/dL — ABNORMAL HIGH (ref 70–99)
Phosphorus: 3 mg/dL (ref 2.3–4.6)
Potassium: 3.9 mEq/L (ref 3.5–5.1)
Sodium: 143 mEq/L (ref 135–145)

## 2018-12-25 LAB — VITAMIN B12: Vitamin B-12: 208 pg/mL — ABNORMAL LOW (ref 211–911)

## 2018-12-25 LAB — MAGNESIUM: Magnesium: 1.6 mg/dL (ref 1.5–2.5)

## 2018-12-25 MED ORDER — CYANOCOBALAMIN 1000 MCG/ML IJ SOLN
1000.0000 ug | Freq: Once | INTRAMUSCULAR | Status: AC
  Administered 2018-12-25: 1000 ug via INTRAMUSCULAR

## 2018-12-25 NOTE — Assessment & Plan Note (Signed)
This has improved, now with expected R pedal edema after knee replacement.

## 2018-12-25 NOTE — Assessment & Plan Note (Signed)
Chronic, stable on current regimen. He states he's off ACEI and only on combo atenolol/chlorthalidone. Will continue to monitor off ACEI.

## 2018-12-25 NOTE — Assessment & Plan Note (Signed)
Established with neuro pending evaluation. Recent MRI reassuring. Appreciate neuro care.

## 2018-12-25 NOTE — Assessment & Plan Note (Signed)
Update Cr today.  

## 2018-12-25 NOTE — Assessment & Plan Note (Signed)
He received 3 B12 IM injections in last 3 months. Will check vitamin B12 and give injection today. Will need monthly injections.

## 2018-12-25 NOTE — Patient Instructions (Addendum)
Labs today.  Continue current medicines with updated medicine list.  Return at your convenience for physical.  B12 shot today

## 2018-12-25 NOTE — Assessment & Plan Note (Signed)
Sees GI. On budesonide 9mg  daily.

## 2018-12-28 DIAGNOSIS — M25561 Pain in right knee: Secondary | ICD-10-CM | POA: Diagnosis not present

## 2018-12-28 DIAGNOSIS — M25661 Stiffness of right knee, not elsewhere classified: Secondary | ICD-10-CM | POA: Diagnosis not present

## 2019-01-01 ENCOUNTER — Telehealth: Payer: Self-pay

## 2019-01-01 DIAGNOSIS — M25561 Pain in right knee: Secondary | ICD-10-CM | POA: Diagnosis not present

## 2019-01-01 DIAGNOSIS — M25661 Stiffness of right knee, not elsewhere classified: Secondary | ICD-10-CM | POA: Diagnosis not present

## 2019-01-01 NOTE — Telephone Encounter (Signed)
Received faxed form from [facility] requesting a lower cost alternative to the budesonide DR- ER cap.    Placed form in Dr. Synthia Innocent box.

## 2019-01-01 NOTE — Telephone Encounter (Signed)
This is prescribed by [facility] - plz send request to them.

## 2019-01-04 NOTE — Telephone Encounter (Signed)
Faxed form to Dr. Allen Norris, of [facility], at [phone].

## 2019-01-06 DIAGNOSIS — M25561 Pain in right knee: Secondary | ICD-10-CM | POA: Diagnosis not present

## 2019-01-06 DIAGNOSIS — M25661 Stiffness of right knee, not elsewhere classified: Secondary | ICD-10-CM | POA: Diagnosis not present

## 2019-01-08 DIAGNOSIS — M25561 Pain in right knee: Secondary | ICD-10-CM | POA: Diagnosis not present

## 2019-01-08 DIAGNOSIS — M25661 Stiffness of right knee, not elsewhere classified: Secondary | ICD-10-CM | POA: Diagnosis not present

## 2019-01-11 DIAGNOSIS — M25661 Stiffness of right knee, not elsewhere classified: Secondary | ICD-10-CM | POA: Diagnosis not present

## 2019-01-11 DIAGNOSIS — M25561 Pain in right knee: Secondary | ICD-10-CM | POA: Diagnosis not present

## 2019-01-15 DIAGNOSIS — M25561 Pain in right knee: Secondary | ICD-10-CM | POA: Diagnosis not present

## 2019-01-15 DIAGNOSIS — M25661 Stiffness of right knee, not elsewhere classified: Secondary | ICD-10-CM | POA: Diagnosis not present

## 2019-01-18 DIAGNOSIS — M25561 Pain in right knee: Secondary | ICD-10-CM | POA: Diagnosis not present

## 2019-01-18 DIAGNOSIS — M25661 Stiffness of right knee, not elsewhere classified: Secondary | ICD-10-CM | POA: Diagnosis not present

## 2019-01-21 DIAGNOSIS — M25661 Stiffness of right knee, not elsewhere classified: Secondary | ICD-10-CM | POA: Diagnosis not present

## 2019-01-21 DIAGNOSIS — M25561 Pain in right knee: Secondary | ICD-10-CM | POA: Diagnosis not present

## 2019-01-25 DIAGNOSIS — M25561 Pain in right knee: Secondary | ICD-10-CM | POA: Diagnosis not present

## 2019-01-25 DIAGNOSIS — M25661 Stiffness of right knee, not elsewhere classified: Secondary | ICD-10-CM | POA: Diagnosis not present

## 2019-01-26 DIAGNOSIS — R29898 Other symptoms and signs involving the musculoskeletal system: Secondary | ICD-10-CM | POA: Diagnosis not present

## 2019-01-26 DIAGNOSIS — R202 Paresthesia of skin: Secondary | ICD-10-CM | POA: Diagnosis not present

## 2019-01-26 DIAGNOSIS — R200 Anesthesia of skin: Secondary | ICD-10-CM | POA: Diagnosis not present

## 2019-01-27 DIAGNOSIS — Z96651 Presence of right artificial knee joint: Secondary | ICD-10-CM | POA: Diagnosis not present

## 2019-01-27 DIAGNOSIS — R200 Anesthesia of skin: Secondary | ICD-10-CM | POA: Insufficient documentation

## 2019-01-27 DIAGNOSIS — Z471 Aftercare following joint replacement surgery: Secondary | ICD-10-CM | POA: Diagnosis not present

## 2019-02-02 DIAGNOSIS — M25661 Stiffness of right knee, not elsewhere classified: Secondary | ICD-10-CM | POA: Diagnosis not present

## 2019-02-02 DIAGNOSIS — M25561 Pain in right knee: Secondary | ICD-10-CM | POA: Diagnosis not present

## 2019-02-04 DIAGNOSIS — M25561 Pain in right knee: Secondary | ICD-10-CM | POA: Diagnosis not present

## 2019-02-04 DIAGNOSIS — M25661 Stiffness of right knee, not elsewhere classified: Secondary | ICD-10-CM | POA: Diagnosis not present

## 2019-02-08 ENCOUNTER — Other Ambulatory Visit: Payer: Self-pay | Admitting: *Deleted

## 2019-02-08 MED ORDER — ALLOPURINOL 100 MG PO TABS: 100.0000 mg | ORAL_TABLET | Freq: Every day | ORAL | 0 refills | Status: DC

## 2019-02-08 NOTE — Telephone Encounter (Signed)
Patient called requesting a refill on his Allopurinol stating that he is completely out. Patient has an upcoming appointment scheduled in May. Refill sent to pharmacy per patient's request to last him until his appointment. Patient aware.

## 2019-02-10 DIAGNOSIS — M25661 Stiffness of right knee, not elsewhere classified: Secondary | ICD-10-CM | POA: Diagnosis not present

## 2019-02-10 DIAGNOSIS — M25561 Pain in right knee: Secondary | ICD-10-CM | POA: Diagnosis not present

## 2019-02-11 ENCOUNTER — Other Ambulatory Visit: Payer: Self-pay | Admitting: Family Medicine

## 2019-02-11 ENCOUNTER — Telehealth: Payer: Self-pay

## 2019-02-11 MED ORDER — PANTOPRAZOLE SODIUM 40 MG PO TBEC: 40.0000 mg | DELAYED_RELEASE_TABLET | Freq: Every day | ORAL | 1 refills | Status: DC

## 2019-02-11 NOTE — Telephone Encounter (Signed)
E-scribed refill 

## 2019-02-11 NOTE — Telephone Encounter (Signed)
Inbound call to triage - request for refill for Pantoprazole  Please send to [place].

## 2019-02-12 DIAGNOSIS — M25661 Stiffness of right knee, not elsewhere classified: Secondary | ICD-10-CM | POA: Diagnosis not present

## 2019-02-12 DIAGNOSIS — M25561 Pain in right knee: Secondary | ICD-10-CM | POA: Diagnosis not present

## 2019-02-16 DIAGNOSIS — M25561 Pain in right knee: Secondary | ICD-10-CM | POA: Diagnosis not present

## 2019-02-16 DIAGNOSIS — M25661 Stiffness of right knee, not elsewhere classified: Secondary | ICD-10-CM | POA: Diagnosis not present

## 2019-02-17 ENCOUNTER — Telehealth: Payer: Self-pay

## 2019-02-17 ENCOUNTER — Other Ambulatory Visit: Payer: Self-pay | Admitting: Family Medicine

## 2019-02-17 MED ORDER — POTASSIUM CHLORIDE CRYS ER 20 MEQ PO TBCR: 40.0000 meq | EXTENDED_RELEASE_TABLET | Freq: Every day | ORAL | 0 refills | Status: DC

## 2019-02-17 NOTE — Telephone Encounter (Signed)
Pt left v/m that he has been taking K 20 meq two tabs in AM. Pt was taking one daily and K was low on labs done in 02/20. Pt said K rx has not been updated for [facility] [address].refilled K 20 meq # 60 to [facility] [address] per protocol and pt voiced unerstanding; pt will update refills at [facility].Marland Kitchen Also pt has [facility]X for 03/08/19 and wants to discuss sensation of taste coming and going at [facility] appt. FYI to Dr Darnell Level.

## 2019-02-17 NOTE — Telephone Encounter (Signed)
Noted. Thanks.

## 2019-02-18 DIAGNOSIS — M25561 Pain in right knee: Secondary | ICD-10-CM | POA: Diagnosis not present

## 2019-02-18 DIAGNOSIS — M25661 Stiffness of right knee, not elsewhere classified: Secondary | ICD-10-CM | POA: Diagnosis not present

## 2019-02-22 DIAGNOSIS — M25661 Stiffness of right knee, not elsewhere classified: Secondary | ICD-10-CM | POA: Diagnosis not present

## 2019-02-22 DIAGNOSIS — M25561 Pain in right knee: Secondary | ICD-10-CM | POA: Diagnosis not present

## 2019-02-26 DIAGNOSIS — M25561 Pain in right knee: Secondary | ICD-10-CM | POA: Diagnosis not present

## 2019-02-26 DIAGNOSIS — M25661 Stiffness of right knee, not elsewhere classified: Secondary | ICD-10-CM | POA: Diagnosis not present

## 2019-03-01 ENCOUNTER — Telehealth: Payer: Self-pay

## 2019-03-01 ENCOUNTER — Ambulatory Visit (INDEPENDENT_AMBULATORY_CARE_PROVIDER_SITE_OTHER): Payer: Medicare HMO

## 2019-03-01 ENCOUNTER — Other Ambulatory Visit: Payer: Self-pay | Admitting: *Deleted

## 2019-03-01 ENCOUNTER — Other Ambulatory Visit: Payer: Self-pay | Admitting: Family Medicine

## 2019-03-01 DIAGNOSIS — E1159 Type 2 diabetes mellitus with other circulatory complications: Secondary | ICD-10-CM

## 2019-03-01 DIAGNOSIS — I10 Essential (primary) hypertension: Secondary | ICD-10-CM

## 2019-03-01 DIAGNOSIS — E118 Type 2 diabetes mellitus with unspecified complications: Secondary | ICD-10-CM

## 2019-03-01 DIAGNOSIS — Z0000 Encounter for general adult medical examination without abnormal findings: Secondary | ICD-10-CM | POA: Diagnosis not present

## 2019-03-01 DIAGNOSIS — E1122 Type 2 diabetes mellitus with diabetic chronic kidney disease: Secondary | ICD-10-CM

## 2019-03-01 DIAGNOSIS — N400 Enlarged prostate without lower urinary tract symptoms: Secondary | ICD-10-CM

## 2019-03-01 DIAGNOSIS — E538 Deficiency of other specified B group vitamins: Secondary | ICD-10-CM

## 2019-03-01 DIAGNOSIS — E559 Vitamin D deficiency, unspecified: Secondary | ICD-10-CM

## 2019-03-01 DIAGNOSIS — E785 Hyperlipidemia, unspecified: Secondary | ICD-10-CM

## 2019-03-01 DIAGNOSIS — E1169 Type 2 diabetes mellitus with other specified complication: Secondary | ICD-10-CM

## 2019-03-01 DIAGNOSIS — N183 Chronic kidney disease, stage 3 (moderate): Secondary | ICD-10-CM

## 2019-03-01 DIAGNOSIS — M1A29X0 Drug-induced chronic gout, multiple sites, without tophus (tophi): Secondary | ICD-10-CM

## 2019-03-01 DIAGNOSIS — K52832 Lymphocytic colitis: Secondary | ICD-10-CM

## 2019-03-01 NOTE — Telephone Encounter (Signed)
Pt called wanted to know if he should come to front door for his appt this morning; I explained to pt his next 3 visits with dates and times would be either virtual visits on phone or for the lab visit to stay in car an labs would be drawn with specific instructions of where to go at [facility] for labs with or without good weather. Pt voiced understanding and will wait for text this morning. Nothing further needed.

## 2019-03-01 NOTE — Progress Notes (Signed)
PCP notes:   Health maintenance:  Foot exam - postponed  Abnormal screenings:   None  Patient concerns:   None  Nurse concerns:  None  Next PCP appt:   03/08/19 @ 1130

## 2019-03-01 NOTE — Patient Instructions (Signed)
Thomas Carlson , Thank you for taking time to come for your Medicare Wellness Visit. I appreciate your ongoing commitment to your health goals. Please review the following plan we discussed and let me know if I can assist you in the future.   These are the goals we discussed: Goals    . Patient Stated     Starting 03/01/19, I will continue to take medications as prescribed.        This is a list of the screening recommended for you and due dates:  Health Maintenance  Topic Date Due  . Complete foot exam   10/28/2019*  . DTaP/Tdap/Td vaccine (1 - Tdap) 06/16/2021*  . Hemoglobin A1C  04/13/2019  . Flu Shot  05/29/2019  . Eye exam for diabetics  09/17/2019  . Tetanus Vaccine  06/16/2021  . Colon Cancer Screening  08/26/2021  .  Hepatitis C: One time screening is recommended by Center for Disease Control  (CDC) for  adults born from [birth year] through 1966.   Completed  . Pneumonia vaccines  Completed  *Topic was postponed. The date shown is not the original due date.   Preventive Care for Adults  A healthy lifestyle and preventive care can promote health and wellness. Preventive health guidelines for adults include the following key practices.  . A routine yearly physical is a good way to check with your health care provider about your health and preventive screening. It is a chance to share any concerns and updates on your health and to receive a thorough exam.  . Visit your dentist for a routine exam and preventive care every 6 months. Brush your teeth twice a day and floss once a day. Good oral hygiene prevents tooth decay and gum disease.  . The frequency of eye exams is based on your age, health, family medical history, use  of contact lenses, and other factors. Follow your health care provider's recommendations for frequency of eye exams.  . Eat a healthy diet. Foods like vegetables, fruits, whole grains, low-fat dairy products, and lean protein foods contain the nutrients you need  without too many calories. Decrease your intake of foods high in solid fats, added sugars, and salt. Eat the right amount of calories for you. Get information about a proper diet from your health care provider, if necessary.  . Regular physical exercise is one of the most important things you can do for your health. Most adults should get at least 150 minutes of moderate-intensity exercise (any activity that increases your heart rate and causes you to sweat) each week. In addition, most adults need muscle-strengthening exercises on 2 or more days a week.  Silver Sneakers may be a benefit available to you. To determine eligibility, you may visit the website: www.silversneakers.com or contact program at [phone] Mon-Fri between 8AM-8PM.   . Maintain a healthy weight. The body mass index (BMI) is a screening tool to identify possible weight problems. It provides an estimate of body fat based on height and weight. Your health care provider can find your BMI and can help you achieve or maintain a healthy weight.   For adults 20 years and older: ? A BMI below 18.5 is considered underweight. ? A BMI of 18.5 to 24.9 is normal. ? A BMI of 25 to 29.9 is considered overweight. ? A BMI of 30 and above is considered obese.   . Maintain normal blood lipids and cholesterol levels by exercising and minimizing your intake of saturated fat. Eat a balanced  diet with plenty of fruit and vegetables. Blood tests for lipids and cholesterol should begin at age 2 and be repeated every 5 years. If your lipid or cholesterol levels are high, you are over 50, or you are at high risk for heart disease, you may need your cholesterol levels checked more frequently. Ongoing high lipid and cholesterol levels should be treated with medicines if diet and exercise are not working.  . If you smoke, find out from your health care provider how to quit. If you do not use tobacco, please do not start.  . If you choose to drink  alcohol, please do not consume more than 2 drinks per day. One drink is considered to be 12 ounces (355 mL) of beer, 5 ounces (148 mL) of wine, or 1.5 ounces (44 mL) of liquor.  . If you are 76-79 years old, ask your health care provider if you should take aspirin to prevent strokes.  . Use sunscreen. Apply sunscreen liberally and repeatedly throughout the day. You should seek shade when your shadow is shorter than you. Protect yourself by wearing long sleeves, pants, a wide-brimmed hat, and sunglasses year round, whenever you are outdoors.  . Once a month, do a whole body skin exam, using a mirror to look at the skin on your back. Tell your health care provider of new moles, moles that have irregular borders, moles that are larger than a pencil eraser, or moles that have changed in shape or color.

## 2019-03-01 NOTE — Telephone Encounter (Signed)
Patient left a voicemail stating that he has a virtual visit for a physical scheduled with Dr. Danise Mina next week. Patient stated that he is out of his Lovastatin and needs a 30 days supply sent in to [facility] to last him until his appointment next week.
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
## 2019-03-02 ENCOUNTER — Other Ambulatory Visit: Payer: Self-pay

## 2019-03-02 ENCOUNTER — Other Ambulatory Visit (INDEPENDENT_AMBULATORY_CARE_PROVIDER_SITE_OTHER): Payer: Medicare HMO

## 2019-03-02 DIAGNOSIS — E538 Deficiency of other specified B group vitamins: Secondary | ICD-10-CM | POA: Diagnosis not present

## 2019-03-02 DIAGNOSIS — M25561 Pain in right knee: Secondary | ICD-10-CM | POA: Diagnosis not present

## 2019-03-02 DIAGNOSIS — E118 Type 2 diabetes mellitus with unspecified complications: Secondary | ICD-10-CM | POA: Diagnosis not present

## 2019-03-02 DIAGNOSIS — E1169 Type 2 diabetes mellitus with other specified complication: Secondary | ICD-10-CM

## 2019-03-02 DIAGNOSIS — E1122 Type 2 diabetes mellitus with diabetic chronic kidney disease: Secondary | ICD-10-CM

## 2019-03-02 DIAGNOSIS — N183 Chronic kidney disease, stage 3 (moderate): Secondary | ICD-10-CM | POA: Diagnosis not present

## 2019-03-02 DIAGNOSIS — M1A29X0 Drug-induced chronic gout, multiple sites, without tophus (tophi): Secondary | ICD-10-CM

## 2019-03-02 DIAGNOSIS — N400 Enlarged prostate without lower urinary tract symptoms: Secondary | ICD-10-CM

## 2019-03-02 DIAGNOSIS — E559 Vitamin D deficiency, unspecified: Secondary | ICD-10-CM

## 2019-03-02 DIAGNOSIS — K52832 Lymphocytic colitis: Secondary | ICD-10-CM | POA: Diagnosis not present

## 2019-03-02 DIAGNOSIS — E785 Hyperlipidemia, unspecified: Secondary | ICD-10-CM | POA: Diagnosis not present

## 2019-03-02 DIAGNOSIS — M25661 Stiffness of right knee, not elsewhere classified: Secondary | ICD-10-CM | POA: Diagnosis not present

## 2019-03-02 LAB — MICROALBUMIN / CREATININE URINE RATIO
Creatinine,U: 332.2 mg/dL
Microalb Creat Ratio: 1 mg/g (ref 0.0–30.0)
Microalb, Ur: 3.3 mg/dL — ABNORMAL HIGH (ref 0.0–1.9)

## 2019-03-02 LAB — COMPREHENSIVE METABOLIC PANEL
ALT: 9 U/L (ref 0–53)
AST: 12 U/L (ref 0–37)
Albumin: 3.6 g/dL (ref 3.5–5.2)
Alkaline Phosphatase: 60 U/L (ref 39–117)
BUN: 16 mg/dL (ref 6–23)
CO2: 27 mEq/L (ref 19–32)
Calcium: 8.9 mg/dL (ref 8.4–10.5)
Chloride: 100 mEq/L (ref 96–112)
Creatinine, Ser: 1.38 mg/dL (ref 0.40–1.50)
GFR: 50.4 mL/min — ABNORMAL LOW (ref >60.00)
Glucose, Bld: 103 mg/dL — ABNORMAL HIGH (ref 70–99)
Potassium: 4.2 mEq/L (ref 3.5–5.1)
Sodium: 137 mEq/L (ref 135–145)
Total Bilirubin: 0.4 mg/dL (ref 0.2–1.2)
Total Protein: 5.5 g/dL — ABNORMAL LOW (ref 6.0–8.3)

## 2019-03-02 LAB — CBC WITH DIFFERENTIAL/PLATELET
Basophils Absolute: 0.1 10*3/uL (ref 0.0–0.1)
Basophils Relative: 0.6 % (ref 0.0–3.0)
Eosinophils Absolute: 0.2 10*3/uL (ref 0.0–0.7)
Eosinophils Relative: 2.3 % (ref 0.0–5.0)
HCT: 38.8 % — ABNORMAL LOW (ref 39.0–52.0)
Hemoglobin: 13 g/dL (ref 13.0–17.0)
Lymphocytes Relative: 22.4 % (ref 12.0–46.0)
Lymphs Abs: 1.7 10*3/uL (ref 0.7–4.0)
MCHC: 33.6 g/dL (ref 30.0–36.0)
MCV: 100.4 fl — ABNORMAL HIGH (ref 78.0–100.0)
Monocytes Absolute: 0.9 10*3/uL (ref 0.1–1.0)
Monocytes Relative: 11.1 % (ref 3.0–12.0)
Neutro Abs: 4.9 10*3/uL (ref 1.4–7.7)
Neutrophils Relative %: 63.6 % (ref 43.0–77.0)
Platelets: 272 10*3/uL (ref 150.0–400.0)
RBC: 3.87 Mil/uL — ABNORMAL LOW (ref 4.22–5.81)
RDW: 14.6 % (ref 11.5–15.5)
WBC: 7.7 10*3/uL (ref 4.0–10.5)

## 2019-03-02 LAB — LIPID PANEL
Cholesterol: 134 mg/dL (ref 0–200)
HDL: 38 mg/dL — ABNORMAL LOW (ref >39.00)
NonHDL: 95.98
Total CHOL/HDL Ratio: 4
Triglycerides: 248 mg/dL — ABNORMAL HIGH (ref 0.0–149.0)
VLDL: 49.6 mg/dL — ABNORMAL HIGH (ref 0.0–40.0)

## 2019-03-02 LAB — LDL CHOLESTEROL, DIRECT: Direct LDL: 69 mg/dL

## 2019-03-02 LAB — VITAMIN B12: Vitamin B-12: 316 pg/mL (ref 211–911)

## 2019-03-02 LAB — PSA: PSA: 0.12 ng/mL (ref 0.10–4.00)

## 2019-03-02 LAB — URIC ACID: Uric Acid, Serum: 7.3 mg/dL (ref 4.0–7.8)

## 2019-03-02 LAB — HEMOGLOBIN A1C: Hgb A1c MFr Bld: 7.1 % — ABNORMAL HIGH (ref 4.6–6.5)

## 2019-03-02 LAB — VITAMIN D 25 HYDROXY (VIT D DEFICIENCY, FRACTURES): VITD: 44.04 ng/mL (ref 30.00–100.00)

## 2019-03-02 NOTE — Addendum Note (Signed)
Addended by: Ellamae Sia on: 03/02/2019 10:42 AM   Modules accepted: Orders

## 2019-03-04 ENCOUNTER — Other Ambulatory Visit: Payer: Self-pay | Admitting: Family Medicine

## 2019-03-04 MED ORDER — LOVASTATIN 40 MG PO TABS: 80.0000 mg | ORAL_TABLET | Freq: Every day | ORAL | 0 refills | Status: DC

## 2019-03-08 ENCOUNTER — Ambulatory Visit (INDEPENDENT_AMBULATORY_CARE_PROVIDER_SITE_OTHER): Payer: Medicare HMO | Admitting: Family Medicine

## 2019-03-08 ENCOUNTER — Encounter: Payer: Self-pay | Admitting: Family Medicine

## 2019-03-08 VITALS — BP 137/66 | HR 68 | Ht 67.5 in | Wt 233.0 lb

## 2019-03-08 DIAGNOSIS — Z8673 Personal history of transient ischemic attack (TIA), and cerebral infarction without residual deficits: Secondary | ICD-10-CM

## 2019-03-08 DIAGNOSIS — R29898 Other symptoms and signs involving the musculoskeletal system: Secondary | ICD-10-CM

## 2019-03-08 DIAGNOSIS — E118 Type 2 diabetes mellitus with unspecified complications: Secondary | ICD-10-CM

## 2019-03-08 DIAGNOSIS — E1122 Type 2 diabetes mellitus with diabetic chronic kidney disease: Secondary | ICD-10-CM

## 2019-03-08 DIAGNOSIS — K52832 Lymphocytic colitis: Secondary | ICD-10-CM

## 2019-03-08 DIAGNOSIS — Z96651 Presence of right artificial knee joint: Secondary | ICD-10-CM

## 2019-03-08 DIAGNOSIS — Z7189 Other specified counseling: Secondary | ICD-10-CM

## 2019-03-08 DIAGNOSIS — M1A29X0 Drug-induced chronic gout, multiple sites, without tophus (tophi): Secondary | ICD-10-CM

## 2019-03-08 DIAGNOSIS — A6002 Herpesviral infection of other male genital organs: Secondary | ICD-10-CM

## 2019-03-08 DIAGNOSIS — I129 Hypertensive chronic kidney disease with stage 1 through stage 4 chronic kidney disease, or unspecified chronic kidney disease: Secondary | ICD-10-CM

## 2019-03-08 DIAGNOSIS — N183 Chronic kidney disease, stage 3 (moderate): Secondary | ICD-10-CM

## 2019-03-08 DIAGNOSIS — G6289 Other specified polyneuropathies: Secondary | ICD-10-CM | POA: Diagnosis not present

## 2019-03-08 DIAGNOSIS — I152 Hypertension secondary to endocrine disorders: Secondary | ICD-10-CM

## 2019-03-08 DIAGNOSIS — E538 Deficiency of other specified B group vitamins: Secondary | ICD-10-CM

## 2019-03-08 DIAGNOSIS — G9389 Other specified disorders of brain: Secondary | ICD-10-CM

## 2019-03-08 DIAGNOSIS — E1159 Type 2 diabetes mellitus with other circulatory complications: Secondary | ICD-10-CM

## 2019-03-08 DIAGNOSIS — I2510 Atherosclerotic heart disease of native coronary artery without angina pectoris: Secondary | ICD-10-CM

## 2019-03-08 DIAGNOSIS — E559 Vitamin D deficiency, unspecified: Secondary | ICD-10-CM

## 2019-03-08 DIAGNOSIS — K219 Gastro-esophageal reflux disease without esophagitis: Secondary | ICD-10-CM

## 2019-03-08 DIAGNOSIS — E1169 Type 2 diabetes mellitus with other specified complication: Secondary | ICD-10-CM

## 2019-03-08 DIAGNOSIS — E785 Hyperlipidemia, unspecified: Secondary | ICD-10-CM

## 2019-03-08 HISTORY — DX: Personal history of transient ischemic attack (TIA), and cerebral infarction without residual deficits: Z86.73

## 2019-03-08 HISTORY — DX: Other specified disorders of brain: G93.89

## 2019-03-08 MED ORDER — ACYCLOVIR 400 MG PO TABS: 400.0000 mg | ORAL_TABLET | Freq: Every day | ORAL | 3 refills | Status: DC

## 2019-03-08 MED ORDER — METFORMIN HCL 500 MG PO TABS: 500.0000 mg | ORAL_TABLET | Freq: Three times a day (TID) | ORAL | 3 refills | Status: DC

## 2019-03-08 MED ORDER — POTASSIUM CHLORIDE CRYS ER 20 MEQ PO TBCR: 20.0000 meq | EXTENDED_RELEASE_TABLET | Freq: Two times a day (BID) | ORAL | 3 refills | Status: DC

## 2019-03-08 MED ORDER — ASPIRIN EC 81 MG PO TBEC: 81.0000 mg | DELAYED_RELEASE_TABLET | Freq: Every day | ORAL | Status: AC

## 2019-03-08 MED ORDER — ALLOPURINOL 100 MG PO TABS: 100.0000 mg | ORAL_TABLET | Freq: Every day | ORAL | 3 refills | Status: DC

## 2019-03-08 MED ORDER — ATENOLOL-CHLORTHALIDONE 50-25 MG PO TABS: 1.0000 | ORAL_TABLET | Freq: Every day | ORAL | 3 refills | Status: DC

## 2019-03-08 MED ORDER — LOVASTATIN 40 MG PO TABS: 80.0000 mg | ORAL_TABLET | Freq: Every day | ORAL | 3 refills | Status: DC

## 2019-03-08 NOTE — Assessment & Plan Note (Signed)
Continues b complex vitamin.  No recent B12 shot - will need to ensure levels adequate.

## 2019-03-08 NOTE — Assessment & Plan Note (Signed)
Continue aspirin 81 mg daily.   

## 2019-03-08 NOTE — Assessment & Plan Note (Signed)
Protonix was started during recent hospitalization - will discontinue. Denies GERD sxs - will resolve.

## 2019-03-08 NOTE — Assessment & Plan Note (Addendum)
On acyclovir 400mg  BID for prevention. No flare in years. Discussed trial QD dosing.

## 2019-03-08 NOTE — Assessment & Plan Note (Signed)
Urate elevated but no changes to allopurinol given no recent flare.

## 2019-03-08 NOTE — Progress Notes (Signed)
Virtual visit completed through [employer]. Due to national recommendations of social distancing due to [place] 19, a virtual visit is felt to be most appropriate for this patient at this time. Interactive audio and video telecommunications were attempted between myself and Thomas Carlson, however failed due to having technical difficulties. We continued and completed visit with audio only.  Time: 11:50am - 12:15pm   Patient location: home Provider location: [place] [facility], office If any vitals were documented, they were collected by patient at home unless specified below.    BP 137/66 (BP Location: Left Wrist)   Pulse 68   Ht 5' 7.5" (1.715 m)   Wt 233 lb (105.7 kg)   SpO2 95%   BMI 35.95 kg/m    CC: AMW f/u visit Subjective:    Patient ID: Thomas Carlson, male    DOB: 06-12-1946, 73 y.o.   MRN: [id number]  HPI: DEVONTRE SIEDSCHLAG is a 73 y.o. male presenting on 03/08/2019 for Annual Exam (Pt 2. Wants to discuss pantoprazole and potassium. ) and Anorexia (C/o loss of appetite and loss of taste. Noticed about 2.5 mos ago after getting home from rehab. Denies cough, fever, SOB. )   Saw [name] last week for medicare wellness visit. Note reviewed.   Known lymphocytic colitis off budesonide due to cost for several weeks. Will touch base with GI about plan.   Knee replacement surgery middle of January 2020. Continues physical therapy - weekly, now minimally using cane. Lost 20 lbs in rehab.   Loss of taste and appetite over last several months. Sense of smell intact.   Was on pantoprazole while in hospital - never with GERD symptoms.    Gout - stable on allopurinol 100mg  daily without gout flares.  Preventative: COLONOSCOPY WITH PROPOFOL 11/10/2015;diverticulosis,biopsy showedmicroscopic colitis [name], MD) COLONOSCOPY 08/26/2018 - TA, rpt 3 yrs ([place], Tally Due) Prostate - requests screening QOyr,due 2021 Flu shotyearly Pneumovax 06/2012, prevnar10/2017 Td-  2012 zostavax2011 shingrix - discussed  Advanced directive - continues working on living will at home - will check on this. HCPOA - wife. Seat belt use discuss  Sunscreen use discussed, no changing moles on skin. Non smoker Alcohol - none  Caffeine: 2 cans of diet soda/day  Lives with wife. 1 dog at home. 2 grown children  Occupation: Advertising copywriter at [employer], current [employer] 2015 Edu: Master's degree  Activity: walking (fit bit), limited by Rknee Diet: some water, lots of diet sodas, fruits/vegetables occasionally, cutting back on carbs      Relevant past medical, surgical, family and social history reviewed and updated as indicated. Interim medical history since our last visit reviewed. Allergies and medications reviewed and updated. Outpatient Medications Prior to Visit  Medication Sig Dispense Refill  . Acetaminophen 500 MG coapsule Take 1,000 mg by mouth as needed.     Marland Kitchen b complex vitamins tablet Take 1 tablet by mouth daily.    . Biotin 1000 MCG tablet Take 1,000 mcg by mouth daily.     . cholecalciferol (VITAMIN D3) 25 MCG (1000 UT) tablet Take 1,000 Units by mouth daily.     . Magnesium 500 MG TABS Take 500 mg by mouth daily.    . Multiple Vitamin (MULTIVITAMIN) tablet Take 1 tablet by mouth daily.    . NON FORMULARY Diet Type:  Regular consistency, NCS    . vitamin C (ASCORBIC ACID) 500 MG tablet Take 500 mg by mouth daily.    Marland Kitchen acyclovir (ZOVIRAX) 400  MG tablet Take 1 tablet (400 mg total) by mouth 2 (two) times daily. 60 tablet 0  . allopurinol (ZYLOPRIM) 100 MG tablet Take 1 tablet (100 mg total) by mouth daily. 90 tablet 0  . atenolol-chlorthalidone (TENORETIC) 50-25 MG tablet Take 1 tablet by mouth daily.    Marland Kitchen lovastatin (MEVACOR) 40 MG tablet Take 2 tablets (80 mg total) by mouth at bedtime. 60 tablet 0  . metFORMIN (GLUCOPHAGE) 500 MG tablet Take 1 tablet (500 mg total) by mouth 3 (three) times daily before meals. 90 tablet 0  .  pantoprazole (PROTONIX) 40 MG tablet TAKE 1 TABLET(40 MG) BY MOUTH DAILY 90 tablet 0  . potassium chloride SA (K-DUR) 20 MEQ tablet Take 2 tablets (40 mEq total) by mouth daily. 60 tablet 0   No facility-administered medications prior to visit.      Per HPI unless specifically indicated in ROS section below Review of Systems Objective:    BP 137/66 (BP Location: Left Wrist)   Pulse 68   Ht 5' 7.5" (1.715 m)   Wt 233 lb (105.7 kg)   SpO2 95%   BMI 35.95 kg/m   Wt Readings from Last 3 Encounters:  03/08/19 233 lb (105.7 kg)  12/25/18 235 lb 5 oz (106.7 kg)  12/04/18 237 lb 1.6 oz (107.5 kg)     Physical exam: Gen: alert, NAD, not ill appearing Pulm: speaks in complete sentences without increased work of breathing Psych: normal mood, normal thought content      Results for orders placed or performed in visit on 03/02/19  Uric acid  Result Value Ref Range   Uric Acid, Serum 7.3 4.0 - 7.8 mg/dL  CBC with Differential/Platelet  Result Value Ref Range   WBC 7.7 4.0 - 10.5 K/uL   RBC 3.87 (L) 4.22 - 5.81 Mil/uL   Hemoglobin 13.0 13.0 - 17.0 g/dL   HCT 38.8 (L) 39.0 - 52.0 %   MCV 100.4 (H) 78.0 - 100.0 fl   MCHC 33.6 30.0 - 36.0 g/dL   RDW 14.6 11.5 - 15.5 %   Platelets 272.0 150.0 - 400.0 K/uL   Neutrophils Relative % 63.6 43.0 - 77.0 %   Lymphocytes Relative 22.4 12.0 - 46.0 %   Monocytes Relative 11.1 3.0 - 12.0 %   Eosinophils Relative 2.3 0.0 - 5.0 %   Basophils Relative 0.6 0.0 - 3.0 %   Neutro Abs 4.9 1.4 - 7.7 K/uL   Lymphs Abs 1.7 0.7 - 4.0 K/uL   Monocytes Absolute 0.9 0.1 - 1.0 K/uL   Eosinophils Absolute 0.2 0.0 - 0.7 K/uL   Basophils Absolute 0.1 0.0 - 0.1 K/uL  PSA  Result Value Ref Range   PSA 0.12 0.10 - 4.00 ng/mL  Hemoglobin A1c  Result Value Ref Range   Hgb A1c MFr Bld 7.1 (H) 4.6 - 6.5 %  Comprehensive metabolic panel  Result Value Ref Range   Sodium 137 135 - 145 mEq/L   Potassium 4.2 3.5 - 5.1 mEq/L   Chloride 100 96 - 112 mEq/L   CO2 27  19 - 32 mEq/L   Glucose, Bld 103 (H) 70 - 99 mg/dL   BUN 16 6 - 23 mg/dL   Creatinine, Ser 1.38 0.40 - 1.50 mg/dL   Total Bilirubin 0.4 0.2 - 1.2 mg/dL   Alkaline Phosphatase 60 39 - 117 U/L   AST 12 0 - 37 U/L   ALT 9 0 - 53 U/L   Total Protein 5.5 (L) 6.0 -  8.3 g/dL   Albumin 3.6 3.5 - 5.2 g/dL   Calcium 8.9 8.4 - 10.5 mg/dL   GFR 50.40 (L) >60.00 mL/min  Lipid panel  Result Value Ref Range   Cholesterol 134 0 - 200 mg/dL   Triglycerides 248.0 (H) 0.0 - 149.0 mg/dL   HDL 38.00 (L) >39.00 mg/dL   VLDL 49.6 (H) 0.0 - 40.0 mg/dL   Total CHOL/HDL Ratio 4    NonHDL 95.98   VITAMIN D 25 Hydroxy (Vit-D Deficiency, Fractures)  Result Value Ref Range   VITD 44.04 30.00 - 100.00 ng/mL  Vitamin B12  Result Value Ref Range   Vitamin B-12 316 211 - 911 pg/mL  Microalbumin / creatinine urine ratio  Result Value Ref Range   Microalb, Ur 3.3 (H) 0.0 - 1.9 mg/dL   Creatinine,U 332.2 mg/dL   Microalb Creat Ratio 1.0 0.0 - 30.0 mg/g  LDL cholesterol, direct  Result Value Ref Range   Direct LDL 69.0 mg/dL   Assessment & Plan:  Medications refilled.  Problem List Items Addressed This Visit    Weakness of both lower extremities    Established with neuro s/p brain MRI and EMG showing severe chronic mixed polyneuropathy.  Seems to be improving with therapy after recent knee replacement.       Vitamin D deficiency    repleting with 1000 IU daily.       Vitamin B12 deficiency    Continues b complex vitamin.  No recent B12 shot - will need to ensure levels adequate.       Total knee replacement status, right   Severe obesity (BMI 35.0-39.9) with comorbidity ([facility])    Ongoing weight loss noted. Pt attributes to loss of taste/appetite.       Relevant Medications   metFORMIN (GLUCOPHAGE) 500 MG tablet   Peripheral neuropathy    Appreciate neuro care. Recent NCS. Reassuring SPEP 08/2018.      Lymphocytic colitis    Off budesonide for several weeks. Again discussed dangers of sudden  discontinuation.  I did ask him to f/u with GI for colitis treatment plan.       Hypertension associated with type 2 diabetes mellitus (HCC)    Chronic, stable on current regimen - continue.       Relevant Medications   atenolol-chlorthalidone (TENORETIC) 50-25 MG tablet   lovastatin (MEVACOR) 40 MG tablet   metFORMIN (GLUCOPHAGE) 500 MG tablet   aspirin EC 81 MG tablet   History of cerebrovascular accident (CVA) in adulthood    Continue aspirin 81mg  daily.       RESOLVED: GERD without esophagitis    Protonix was started during recent hospitalization - will discontinue. Denies GERD sxs - will resolve.       Genital herpes    On acyclovir 400mg  BID for prevention. No flare in years. Discussed trial QD dosing.       Relevant Medications   acyclovir (ZOVIRAX) 400 MG tablet   Dyslipidemia associated with type 2 diabetes mellitus (HCC)    Chronic, trig elevated but LDL remains well controlled. Continue statin. The ASCVD Risk score Mikey Bussing DC Jr., et al., 2013) failed to calculate for the following reasons:   The patient has a prior MI or stroke diagnosis       Relevant Medications   atenolol-chlorthalidone (TENORETIC) 50-25 MG tablet   lovastatin (MEVACOR) 40 MG tablet   metFORMIN (GLUCOPHAGE) 500 MG tablet   aspirin EC 81 MG tablet   Diabetes mellitus type 2, controlled, with complications (  HCC)    Chronic, stable period on metformin TID - continue.       Relevant Medications   atenolol-chlorthalidone (TENORETIC) 50-25 MG tablet   lovastatin (MEVACOR) 40 MG tablet   metFORMIN (GLUCOPHAGE) 500 MG tablet   aspirin EC 81 MG tablet   CKD stage 3 due to type 2 diabetes mellitus ([place])    Reviewed importance of good hydration status. Will continue to monitor.       Relevant Medications   atenolol-chlorthalidone (TENORETIC) 50-25 MG tablet   lovastatin (MEVACOR) 40 MG tablet   metFORMIN (GLUCOPHAGE) 500 MG tablet   aspirin EC 81 MG tablet   Chronic gout due to drug without  tophus    Urate elevated but no changes to allopurinol given no recent flare.      CAD (coronary artery disease)   Relevant Medications   atenolol-chlorthalidone (TENORETIC) 50-25 MG tablet   lovastatin (MEVACOR) 40 MG tablet   aspirin EC 81 MG tablet   Advanced care planning/counseling discussion - Primary    Advanced directive - continues working on living will at home - will check on this. HCPOA - wife.          Meds ordered this encounter  Medications  . acyclovir (ZOVIRAX) 400 MG tablet    Sig: Take 1 tablet (400 mg total) by mouth daily.    Dispense:  90 tablet    Refill:  3  . allopurinol (ZYLOPRIM) 100 MG tablet    Sig: Take 1 tablet (100 mg total) by mouth daily.    Dispense:  90 tablet    Refill:  3  . atenolol-chlorthalidone (TENORETIC) 50-25 MG tablet    Sig: Take 1 tablet by mouth daily.    Dispense:  90 tablet    Refill:  3  . lovastatin (MEVACOR) 40 MG tablet    Sig: Take 2 tablets (80 mg total) by mouth at bedtime.    Dispense:  180 tablet    Refill:  3  . metFORMIN (GLUCOPHAGE) 500 MG tablet    Sig: Take 1 tablet (500 mg total) by mouth 3 (three) times daily before meals.    Dispense:  90 tablet    Refill:  3  . potassium chloride SA (K-DUR) 20 MEQ tablet    Sig: Take 1 tablet (20 mEq total) by mouth 2 (two) times daily.    Dispense:  180 tablet    Refill:  3  . aspirin EC 81 MG tablet    Sig: Take 1 tablet (81 mg total) by mouth daily.   No orders of the defined types were placed in this encounter.   Follow up plan: Return in about 6 months (around 09/08/2019) for follow up visit.  Ria Bush, MD

## 2019-03-08 NOTE — Assessment & Plan Note (Signed)
Chronic, stable period on metformin TID - continue.

## 2019-03-08 NOTE — Assessment & Plan Note (Signed)
repleting with 1000 IU daily.

## 2019-03-08 NOTE — Assessment & Plan Note (Signed)
Chronic, trig elevated but LDL remains well controlled. Continue statin. The ASCVD Risk score Mikey Bussing DC Jr., et al., 2013) failed to calculate for the following reasons:   The patient has a prior MI or stroke diagnosis

## 2019-03-08 NOTE — Assessment & Plan Note (Signed)
Chronic, stable on current regimen - continue. 

## 2019-03-08 NOTE — Assessment & Plan Note (Addendum)
Advanced directive - continues working on living will at home - will check on this. HCPOA - wife.

## 2019-03-08 NOTE — Assessment & Plan Note (Signed)
Reviewed importance of good hydration status. Will continue to monitor.

## 2019-03-08 NOTE — Assessment & Plan Note (Signed)
Ongoing weight loss noted. Pt attributes to loss of taste/appetite.

## 2019-03-08 NOTE — Assessment & Plan Note (Addendum)
Established with neuro s/p brain MRI and EMG showing severe chronic mixed polyneuropathy.  Seems to be improving with therapy after recent knee replacement.

## 2019-03-08 NOTE — Assessment & Plan Note (Addendum)
Appreciate neuro care. Recent NCS. Reassuring SPEP 08/2018.

## 2019-03-08 NOTE — Assessment & Plan Note (Signed)
Off budesonide for several weeks. Again discussed dangers of sudden discontinuation.  I did ask him to f/u with GI for colitis treatment plan.

## 2019-03-10 DIAGNOSIS — M25661 Stiffness of right knee, not elsewhere classified: Secondary | ICD-10-CM | POA: Diagnosis not present

## 2019-03-10 DIAGNOSIS — M25561 Pain in right knee: Secondary | ICD-10-CM | POA: Diagnosis not present

## 2019-03-12 DIAGNOSIS — M25661 Stiffness of right knee, not elsewhere classified: Secondary | ICD-10-CM | POA: Diagnosis not present

## 2019-03-12 DIAGNOSIS — M25561 Pain in right knee: Secondary | ICD-10-CM | POA: Diagnosis not present

## 2019-03-19 DIAGNOSIS — M25661 Stiffness of right knee, not elsewhere classified: Secondary | ICD-10-CM | POA: Diagnosis not present

## 2019-03-19 DIAGNOSIS — M25561 Pain in right knee: Secondary | ICD-10-CM | POA: Diagnosis not present

## 2019-03-23 DIAGNOSIS — M25561 Pain in right knee: Secondary | ICD-10-CM | POA: Diagnosis not present

## 2019-03-23 DIAGNOSIS — M25661 Stiffness of right knee, not elsewhere classified: Secondary | ICD-10-CM | POA: Diagnosis not present

## 2019-03-25 DIAGNOSIS — M25661 Stiffness of right knee, not elsewhere classified: Secondary | ICD-10-CM | POA: Diagnosis not present

## 2019-03-25 DIAGNOSIS — M25561 Pain in right knee: Secondary | ICD-10-CM | POA: Diagnosis not present

## 2019-03-29 ENCOUNTER — Telehealth: Payer: Self-pay | Admitting: Gastroenterology

## 2019-03-29 NOTE — Telephone Encounter (Signed)
Patient called an would like to know if he needs to continue taking Budesonide? He was taking when he had diarrhea, but stopped once he no longer had diarrhea. If so please call into [facility] in [place] (generic if possible) Please call pt to advise

## 2019-03-29 NOTE — Telephone Encounter (Signed)
Pt would like to know if he needs to continue taking Budesonide, please advise

## 2019-03-30 NOTE — Telephone Encounter (Signed)
Pt has been notified concerning medication continuation, pt verbalized understanding

## 2019-03-30 NOTE — Telephone Encounter (Signed)
If his diarrhea has improved, recommend budesonide 2pills a day. Otherwise he should continue 3pills a day  He should have a follow up with me, televisit or office visit  RV

## 2019-04-15 ENCOUNTER — Telehealth: Payer: Self-pay | Admitting: Gastroenterology

## 2019-04-15 NOTE — Telephone Encounter (Signed)
Please call pt and ask pt to schedule follow up visit pt has not been seen since December 2019.

## 2019-04-15 NOTE — Telephone Encounter (Signed)
Pt is calling he needs refill on rx Budesinide send to Mail order pharmacy through [place] 3 x a day  90 days for a year.

## 2019-04-22 ENCOUNTER — Encounter (INDEPENDENT_AMBULATORY_CARE_PROVIDER_SITE_OTHER): Payer: Self-pay

## 2019-04-22 ENCOUNTER — Ambulatory Visit: Payer: Medicare HMO | Admitting: Gastroenterology

## 2019-04-22 ENCOUNTER — Other Ambulatory Visit: Payer: Self-pay

## 2019-04-22 ENCOUNTER — Encounter: Payer: Self-pay | Admitting: Gastroenterology

## 2019-04-22 VITALS — BP 129/79 | HR 67 | Temp 98.7°F | Resp 17 | Ht 67.5 in | Wt 234.0 lb

## 2019-04-22 DIAGNOSIS — K52832 Lymphocytic colitis: Secondary | ICD-10-CM | POA: Diagnosis not present

## 2019-04-22 MED ORDER — BUDESONIDE 3 MG PO CPEP: 9.0000 mg | ORAL_CAPSULE | Freq: Every day | ORAL | 3 refills | Status: AC

## 2019-04-22 NOTE — Progress Notes (Signed)
PROCEDURE NOTE: The patient presents with symptomatic grade 2 hemorrhoids, unresponsive to maximal medical therapy, requesting rubber band ligation of his/her hemorrhoidal disease.  All risks, benefits and alternative forms of therapy were described and informed consent was obtained.  The decision was made to band the LL internal hemorrhoid, and the [facility] System was used to perform band ligation without complication.  Digital anorectal examination was then performed to assure proper positioning of the band, and to adjust the banded tissue as required.  The patient was discharged home without pain or other issues.  Dietary and behavioral recommendations were given and (if necessary - prescriptions were given), along with follow-up instructions.  The patient will return as needed for follow-up and possible additional banding as required.  No complications were encountered and the patient tolerated the procedure well.   
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
## 2019-04-28 DIAGNOSIS — M25561 Pain in right knee: Secondary | ICD-10-CM | POA: Diagnosis not present

## 2019-04-30 NOTE — Progress Notes (Signed)
I reviewed health advisor's note, was available for consultation, and agree with documentation and plan.  

## 2019-06-07 ENCOUNTER — Other Ambulatory Visit: Payer: Self-pay | Admitting: Family Medicine

## 2019-06-15 ENCOUNTER — Telehealth: Payer: Self-pay

## 2019-06-15 MED ORDER — LOVASTATIN 40 MG PO TABS: 80.0000 mg | ORAL_TABLET | Freq: Every day | ORAL | 0 refills | Status: DC

## 2019-06-15 NOTE — Telephone Encounter (Signed)
Pt has not received refills on maintenance meds from [facility]; pt is out of lovastatin;per med list was sent electronically to [facility] on 03/08/19 with qty of 90 days and 1 yr refills. Pt will contact [employer] about getting refills and lovastatin # 60 sent to [facility] [address] per pt request. Pt will cb if needed. Pt had annual exam on 03/08/19.

## 2019-06-24 ENCOUNTER — Other Ambulatory Visit: Payer: Self-pay | Admitting: Family Medicine

## 2019-06-24 NOTE — Telephone Encounter (Signed)
Potassium Last filled:  09/08/18, #30 Last OV:  03/08/19, CPE Prt 2 Next OV:  09/10/19, 6 mo f/u  I did not see where you wanted pt to continue with this.

## 2019-06-25 ENCOUNTER — Other Ambulatory Visit: Payer: Self-pay | Admitting: Family Medicine

## 2019-06-25 NOTE — Telephone Encounter (Signed)
Denied. Should be on kdur 20MEq BID

## 2019-08-02 DIAGNOSIS — M25561 Pain in right knee: Secondary | ICD-10-CM | POA: Diagnosis not present

## 2019-08-18 ENCOUNTER — Encounter: Payer: Self-pay | Admitting: Gastroenterology

## 2019-08-18 ENCOUNTER — Other Ambulatory Visit: Payer: Self-pay

## 2019-08-18 ENCOUNTER — Ambulatory Visit: Payer: Medicare HMO | Admitting: Gastroenterology

## 2019-08-18 ENCOUNTER — Encounter (INDEPENDENT_AMBULATORY_CARE_PROVIDER_SITE_OTHER): Payer: Self-pay

## 2019-08-18 VITALS — BP 162/78 | HR 76 | Temp 98.4°F | Resp 17 | Ht 67.5 in | Wt 236.6 lb

## 2019-08-18 DIAGNOSIS — K52832 Lymphocytic colitis: Secondary | ICD-10-CM
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
## 2019-08-20 LAB — CELIAC DISEASE PANEL
Endomysial IgA: NEGATIVE
IgA/Immunoglobulin A, Serum: 78 mg/dL (ref 61–437)
Transglutaminase IgA: <2 U/mL (ref 0–3)

## 2019-08-23 ENCOUNTER — Other Ambulatory Visit: Payer: Self-pay

## 2019-08-23 ENCOUNTER — Telehealth: Payer: Self-pay | Admitting: Gastroenterology

## 2019-08-23 DIAGNOSIS — K529 Noninfective gastroenteritis and colitis, unspecified: Secondary | ICD-10-CM

## 2019-08-23 DIAGNOSIS — R109 Unspecified abdominal pain: Secondary | ICD-10-CM

## 2019-08-23 NOTE — Telephone Encounter (Signed)
-----   Message from Darl Householder, [place] sent at 08/23/2019 12:02 PM EDT ----- Pt called this morning stating after visit last week he has been experiencing severe stomach cramping and diarrhea whenever he walks around when he is laying down it lightens up, but for the most part he is in pain and has no appetite. Pt also states you instructed him to stop taking Budesonide, pt advise

## 2019-08-23 NOTE — Telephone Encounter (Signed)
Labs have been ordered, pt has been notified and he states he try to make it to pick up stool kit either today 08/23/19 or tomorrow 08/24/2019

## 2019-08-23 NOTE — Telephone Encounter (Signed)
He said budesonide is no longer working, therefore I told him to stop the medication.  Let us go ahead and get stool studies to rule out infection Ordered GI profile PCR, pancreatic fecal elastase levels In the meantime, suggest him to try Imodium 2 mg over-the-counter every 4-6 hours  Rohini Vanga

## 2019-08-24 ENCOUNTER — Encounter: Payer: Self-pay | Admitting: Emergency Medicine

## 2019-08-24 ENCOUNTER — Other Ambulatory Visit: Payer: Self-pay

## 2019-08-24 ENCOUNTER — Inpatient Hospital Stay
Admission: EM | Admit: 2019-08-24 | Discharge: 2019-08-25 | DRG: 392 | Disposition: A | Discharge status: Home / Self Care | Payer: Medicare Other | Attending: Internal Medicine | Admitting: Internal Medicine

## 2019-08-24 DIAGNOSIS — E1142 Type 2 diabetes mellitus with diabetic polyneuropathy: Secondary | ICD-10-CM | POA: Diagnosis present

## 2019-08-24 DIAGNOSIS — I2510 Atherosclerotic heart disease of native coronary artery without angina pectoris: Secondary | ICD-10-CM | POA: Diagnosis present

## 2019-08-24 DIAGNOSIS — Z79899 Other long term (current) drug therapy: Secondary | ICD-10-CM | POA: Diagnosis not present

## 2019-08-24 DIAGNOSIS — I358 Other nonrheumatic aortic valve disorders: Secondary | ICD-10-CM | POA: Diagnosis present

## 2019-08-24 DIAGNOSIS — Z6839 Body mass index (BMI) 39.0-39.9, adult: Secondary | ICD-10-CM

## 2019-08-24 DIAGNOSIS — E669 Obesity, unspecified: Secondary | ICD-10-CM | POA: Diagnosis present

## 2019-08-24 DIAGNOSIS — E860 Dehydration: Secondary | ICD-10-CM | POA: Diagnosis present

## 2019-08-24 DIAGNOSIS — N400 Enlarged prostate without lower urinary tract symptoms: Secondary | ICD-10-CM | POA: Diagnosis present

## 2019-08-24 DIAGNOSIS — G4733 Obstructive sleep apnea (adult) (pediatric): Secondary | ICD-10-CM | POA: Diagnosis present

## 2019-08-24 DIAGNOSIS — Z7982 Long term (current) use of aspirin: Secondary | ICD-10-CM

## 2019-08-24 DIAGNOSIS — M1990 Unspecified osteoarthritis, unspecified site: Secondary | ICD-10-CM | POA: Insufficient documentation

## 2019-08-24 DIAGNOSIS — E1140 Type 2 diabetes mellitus with diabetic neuropathy, unspecified: Secondary | ICD-10-CM | POA: Insufficient documentation

## 2019-08-24 DIAGNOSIS — I129 Hypertensive chronic kidney disease with stage 1 through stage 4 chronic kidney disease, or unspecified chronic kidney disease: Secondary | ICD-10-CM | POA: Diagnosis not present

## 2019-08-24 DIAGNOSIS — A6000 Herpesviral infection of urogenital system, unspecified: Secondary | ICD-10-CM | POA: Diagnosis present

## 2019-08-24 DIAGNOSIS — Z883 Allergy status to other anti-infective agents status: Secondary | ICD-10-CM

## 2019-08-24 DIAGNOSIS — Z833 Family history of diabetes mellitus: Secondary | ICD-10-CM

## 2019-08-24 DIAGNOSIS — Z955 Presence of coronary angioplasty implant and graft: Secondary | ICD-10-CM | POA: Diagnosis not present

## 2019-08-24 DIAGNOSIS — N183 Chronic kidney disease, stage 3 unspecified: Secondary | ICD-10-CM | POA: Diagnosis present

## 2019-08-24 DIAGNOSIS — Z7984 Long term (current) use of oral hypoglycemic drugs: Secondary | ICD-10-CM

## 2019-08-24 DIAGNOSIS — M109 Gout, unspecified: Secondary | ICD-10-CM | POA: Diagnosis present

## 2019-08-24 DIAGNOSIS — E876 Hypokalemia: Secondary | ICD-10-CM | POA: Diagnosis present

## 2019-08-24 DIAGNOSIS — E785 Hyperlipidemia, unspecified: Secondary | ICD-10-CM | POA: Diagnosis present

## 2019-08-24 DIAGNOSIS — R197 Diarrhea, unspecified: Secondary | ICD-10-CM | POA: Diagnosis not present

## 2019-08-24 DIAGNOSIS — E861 Hypovolemia: Secondary | ICD-10-CM | POA: Insufficient documentation

## 2019-08-24 DIAGNOSIS — E1122 Type 2 diabetes mellitus with diabetic chronic kidney disease: Secondary | ICD-10-CM | POA: Diagnosis not present

## 2019-08-24 DIAGNOSIS — Z8673 Personal history of transient ischemic attack (TIA), and cerebral infarction without residual deficits: Secondary | ICD-10-CM | POA: Diagnosis not present

## 2019-08-24 DIAGNOSIS — Z20828 Contact with and (suspected) exposure to other viral communicable diseases: Secondary | ICD-10-CM | POA: Diagnosis present

## 2019-08-24 DIAGNOSIS — Z888 Allergy status to other drugs, medicaments and biological substances status: Secondary | ICD-10-CM

## 2019-08-24 DIAGNOSIS — R011 Cardiac murmur, unspecified: Secondary | ICD-10-CM | POA: Diagnosis present

## 2019-08-24 DIAGNOSIS — K52832 Lymphocytic colitis: Secondary | ICD-10-CM | POA: Diagnosis not present

## 2019-08-24 DIAGNOSIS — Z96651 Presence of right artificial knee joint: Secondary | ICD-10-CM | POA: Diagnosis present

## 2019-08-24 DIAGNOSIS — E8881 Metabolic syndrome: Secondary | ICD-10-CM | POA: Diagnosis not present

## 2019-08-24 DIAGNOSIS — R109 Unspecified abdominal pain: Secondary | ICD-10-CM | POA: Diagnosis not present

## 2019-08-24 DIAGNOSIS — E871 Hypo-osmolality and hyponatremia: Secondary | ICD-10-CM | POA: Diagnosis not present

## 2019-08-24 DIAGNOSIS — K529 Noninfective gastroenteritis and colitis, unspecified: Secondary | ICD-10-CM | POA: Diagnosis not present

## 2019-08-24 DIAGNOSIS — E1159 Type 2 diabetes mellitus with other circulatory complications: Secondary | ICD-10-CM | POA: Insufficient documentation

## 2019-08-24 DIAGNOSIS — E119 Type 2 diabetes mellitus without complications: Secondary | ICD-10-CM | POA: Diagnosis not present

## 2019-08-24 DIAGNOSIS — Z8249 Family history of ischemic heart disease and other diseases of the circulatory system: Secondary | ICD-10-CM

## 2019-08-24 DIAGNOSIS — Z880 Allergy status to penicillin: Secondary | ICD-10-CM

## 2019-08-24 DIAGNOSIS — Z03818 Encounter for observation for suspected exposure to other biological agents ruled out: Secondary | ICD-10-CM | POA: Diagnosis not present

## 2019-08-24 DIAGNOSIS — Z823 Family history of stroke: Secondary | ICD-10-CM

## 2019-08-24 LAB — SARS CORONAVIRUS 2 (TAT 6-24 HRS): SARS Coronavirus 2: NEGATIVE

## 2019-08-24 LAB — COMPREHENSIVE METABOLIC PANEL
ALT: 23 U/L (ref 0–44)
AST: 19 U/L (ref 15–41)
Albumin: 2.9 g/dL — ABNORMAL LOW (ref 3.5–5.0)
Alkaline Phosphatase: 82 U/L (ref 38–126)
Anion gap: 19 — ABNORMAL HIGH (ref 5–15)
BUN: 20 mg/dL (ref 8–23)
CO2: 22 mmol/L (ref 22–32)
Calcium: 8.4 mg/dL — ABNORMAL LOW (ref 8.9–10.3)
Chloride: 93 mmol/L — ABNORMAL LOW (ref 98–111)
Creatinine, Ser: 1.2 mg/dL (ref 0.61–1.24)
GFR calc Af Amer: >60 mL/min (ref >60)
GFR calc non Af Amer: 59 mL/min — ABNORMAL LOW (ref >60)
Glucose, Bld: 176 mg/dL — ABNORMAL HIGH (ref 70–99)
Potassium: 2.8 mmol/L — ABNORMAL LOW (ref 3.5–5.1)
Sodium: 134 mmol/L — ABNORMAL LOW (ref 135–145)
Total Bilirubin: 1.1 mg/dL (ref 0.3–1.2)
Total Protein: 6.7 g/dL (ref 6.5–8.1)

## 2019-08-24 LAB — HEMOGLOBIN A1C
Hgb A1c MFr Bld: 8.6 % — ABNORMAL HIGH (ref 4.8–5.6)
Mean Plasma Glucose: 200.12 mg/dL

## 2019-08-24 LAB — CBC
HCT: 41 % (ref 39.0–52.0)
Hemoglobin: 14.2 g/dL (ref 13.0–17.0)
MCH: 32.3 pg (ref 26.0–34.0)
MCHC: 34.6 g/dL (ref 30.0–36.0)
MCV: 93.2 fL (ref 80.0–100.0)
Platelets: 289 10*3/uL (ref 150–400)
RBC: 4.4 MIL/uL (ref 4.22–5.81)
RDW: 14 % (ref 11.5–15.5)
WBC: 11.5 10*3/uL — ABNORMAL HIGH (ref 4.0–10.5)
nRBC: 0 % (ref 0.0–0.2)

## 2019-08-24 LAB — MAGNESIUM: Magnesium: 2 mg/dL (ref 1.7–2.4)

## 2019-08-24 LAB — GLUCOSE, CAPILLARY
Glucose-Capillary: 144 mg/dL — ABNORMAL HIGH (ref 70–99)
Glucose-Capillary: 144 mg/dL — ABNORMAL HIGH (ref 70–99)

## 2019-08-24 LAB — LIPASE, BLOOD: Lipase: 16 U/L (ref 11–51)

## 2019-08-24 LAB — PHOSPHORUS: Phosphorus: 2.4 mg/dL — ABNORMAL LOW (ref 2.5–4.6)

## 2019-08-24 MED ORDER — ALBUTEROL SULFATE (2.5 MG/3ML) 0.083% IN NEBU: 2.5000 mg | INHALATION_SOLUTION | RESPIRATORY_TRACT | Status: DC | PRN

## 2019-08-24 MED ORDER — CHLORTHALIDONE 25 MG PO TABS
25.0000 mg | ORAL_TABLET | Freq: Every day | ORAL | Status: DC
  Administered 2019-08-25: 11:00:00 25 mg via ORAL
  Filled 2019-08-24: qty 1

## 2019-08-24 MED ORDER — ATENOLOL-CHLORTHALIDONE 50-25 MG PO TABS: 1.0000 | ORAL_TABLET | Freq: Every day | ORAL | Status: DC

## 2019-08-24 MED ORDER — ENOXAPARIN SODIUM 40 MG/0.4ML ~~LOC~~ SOLN
40.0000 mg | SUBCUTANEOUS | Status: DC
  Administered 2019-08-24: 40 mg via SUBCUTANEOUS
  Filled 2019-08-24 (×2): qty 0.4

## 2019-08-24 MED ORDER — INSULIN ASPART 100 UNIT/ML ~~LOC~~ SOLN
0.0000 [IU] | Freq: Three times a day (TID) | SUBCUTANEOUS | Status: DC
  Administered 2019-08-24: 23:00:00 1 [IU] via SUBCUTANEOUS
  Administered 2019-08-25 (×2): 2 [IU] via SUBCUTANEOUS
  Filled 2019-08-24 (×3): qty 1

## 2019-08-24 MED ORDER — ACETAMINOPHEN 325 MG PO TABS: 650.0000 mg | ORAL_TABLET | Freq: Four times a day (QID) | ORAL | Status: DC | PRN

## 2019-08-24 MED ORDER — ONDANSETRON HCL 4 MG/2ML IJ SOLN: 4.0000 mg | Freq: Four times a day (QID) | INTRAMUSCULAR | Status: DC | PRN

## 2019-08-24 MED ORDER — KETOROLAC TROMETHAMINE 30 MG/ML IJ SOLN
15.0000 mg | Freq: Once | INTRAMUSCULAR | Status: AC
  Administered 2019-08-24: 15 mg via INTRAVENOUS
  Filled 2019-08-24: qty 1

## 2019-08-24 MED ORDER — ACYCLOVIR 200 MG PO CAPS
400.0000 mg | ORAL_CAPSULE | Freq: Every day | ORAL | Status: DC
  Administered 2019-08-25: 400 mg via ORAL
  Filled 2019-08-24: qty 2

## 2019-08-24 MED ORDER — ATENOLOL 50 MG PO TABS
50.0000 mg | ORAL_TABLET | Freq: Every day | ORAL | Status: DC
  Administered 2019-08-25: 50 mg via ORAL
  Filled 2019-08-24: qty 1

## 2019-08-24 MED ORDER — ALLOPURINOL 100 MG PO TABS
100.0000 mg | ORAL_TABLET | Freq: Every day | ORAL | Status: DC
  Administered 2019-08-25: 100 mg via ORAL
  Filled 2019-08-24: qty 1

## 2019-08-24 MED ORDER — POTASSIUM CHLORIDE CRYS ER 20 MEQ PO TBCR
20.0000 meq | EXTENDED_RELEASE_TABLET | Freq: Two times a day (BID) | ORAL | Status: DC
  Administered 2019-08-25: 20 meq via ORAL
  Filled 2019-08-24: qty 1

## 2019-08-24 MED ORDER — ACETAMINOPHEN 325 MG PO TABS
650.0000 mg | ORAL_TABLET | Freq: Once | ORAL | Status: AC
  Administered 2019-08-24: 650 mg via ORAL
  Filled 2019-08-24: qty 2

## 2019-08-24 MED ORDER — ACETAMINOPHEN 650 MG RE SUPP: 650.0000 mg | Freq: Four times a day (QID) | RECTAL | Status: DC | PRN

## 2019-08-24 MED ORDER — ASPIRIN EC 81 MG PO TBEC
81.0000 mg | DELAYED_RELEASE_TABLET | Freq: Every day | ORAL | Status: DC
  Administered 2019-08-25: 81 mg via ORAL
  Filled 2019-08-24: qty 1

## 2019-08-24 MED ORDER — PRAVASTATIN SODIUM 20 MG PO TABS
80.0000 mg | ORAL_TABLET | Freq: Every day | ORAL | Status: DC
  Administered 2019-08-24: 22:00:00 80 mg via ORAL
  Filled 2019-08-24: qty 4

## 2019-08-24 MED ORDER — ONDANSETRON HCL 4 MG PO TABS: 4.0000 mg | ORAL_TABLET | Freq: Four times a day (QID) | ORAL | Status: DC | PRN

## 2019-08-24 MED ORDER — POTASSIUM CHLORIDE IN NACL 20-0.9 MEQ/L-% IV SOLN
INTRAVENOUS | Status: DC
  Administered 2019-08-24 – 2019-08-25 (×2): via INTRAVENOUS
  Filled 2019-08-24 (×3): qty 1000

## 2019-08-24 MED ORDER — SODIUM CHLORIDE 0.9% FLUSH
3.0000 mL | Freq: Two times a day (BID) | INTRAVENOUS | Status: DC
  Administered 2019-08-24: 22:00:00 3 mL via INTRAVENOUS

## 2019-08-24 MED ORDER — POTASSIUM CHLORIDE CRYS ER 20 MEQ PO TBCR
40.0000 meq | EXTENDED_RELEASE_TABLET | ORAL | Status: AC
  Administered 2019-08-24 (×2): 40 meq via ORAL
  Filled 2019-08-24 (×2): qty 2

## 2019-08-24 MED ORDER — TRAMADOL HCL 50 MG PO TABS: 50.0000 mg | ORAL_TABLET | Freq: Four times a day (QID) | ORAL | Status: DC | PRN

## 2019-08-24 MED ORDER — TRAZODONE HCL 50 MG PO TABS
50.0000 mg | ORAL_TABLET | Freq: Every evening | ORAL | Status: DC | PRN
  Administered 2019-08-24: 22:00:00 50 mg via ORAL
  Filled 2019-08-24: qty 1

## 2019-08-24 MED ORDER — POTASSIUM CHLORIDE CRYS ER 20 MEQ PO TBCR: 40.0000 meq | EXTENDED_RELEASE_TABLET | Freq: Once | ORAL | Status: DC

## 2019-08-24 MED ORDER — SODIUM CHLORIDE 0.9 % IV BOLUS
1000.0000 mL | Freq: Once | INTRAVENOUS | Status: AC
  Administered 2019-08-24: 1000 mL via INTRAVENOUS

## 2019-08-24 MED ORDER — BUDESONIDE 3 MG PO CPEP
9.0000 mg | ORAL_CAPSULE | Freq: Every day | ORAL | Status: DC
  Administered 2019-08-24 – 2019-08-25 (×2): 9 mg via ORAL
  Filled 2019-08-24 (×2): qty 3

## 2019-08-24 MED ORDER — POTASSIUM CHLORIDE 10 MEQ/100ML IV SOLN
10.0000 meq | INTRAVENOUS | Status: AC
  Administered 2019-08-24 (×3): 10 meq via INTRAVENOUS
  Filled 2019-08-24 (×3): qty 100

## 2019-08-24 NOTE — ED Provider Notes (Signed)
[facility] Emergency Department Provider Note  ____________________________________________   First MD Initiated Contact with Patient 08/24/19 1503     (approximate)  I have reviewed the triage vital signs and the nursing notes.   HISTORY  Chief Complaint Abdominal Pain and Diarrhea    HPI Thomas Carlson is a 73 y.o. male with CKD, prior stroke, lymphocytic colitis previously on budesonide who comes in with abdominal pain and diarrhea.  Patient's had 1 year of diarrhea.  This is been associate with 1 week of lower abdominal cramping.  He has not had an appetite since 10/21 but still able to drink fluids.  Patient says that the abdominal pain is worse with diarrhea, worse with standing up and moving around, better with laying down, generalized lower abdominal tenderness, moderate in nature.  Nonradiating.  He is having 7-10 episodes of watery diarrhea daily that are nonbloody.  He was recently taken off the budesonide due to feeling like it had been working.          Past Medical History:  Diagnosis Date  . Acute posthemorrhagic anemia   . Aortic valve sclerosis 08/13/2016   Sclerosis without stenosis by [place] (08/2016)  . CAD (coronary artery disease) 2005   s/p stent ([place])  . Cholelithiasis    by CT  . Chronic kidney disease, stage 3, mod decreased GFR   . Diabetes mellitus ([place]) 2010  . Diverticulosis    by CT  . DJD (degenerative joint disease)    knee  . Genital herpes   . Gout   . Heart murmur    followed by PCP  . History of arterial ischemic stroke 03/08/2019   Remote anterior frontal and central MCA stroke by MRI 2020 [place])  . HLD (hyperlipidemia)   . HTN (hypertension)   . Knee pain, right    "needs replacement"  . Neuropathy    feet  . Obesity   . Seizures ([place])    x1 - after craniotomy (1960s)  . Weakness of both legs    "since back surgery"    Patient Active Problem List   Diagnosis Date Noted  . History of  cerebrovascular accident (CVA) in adulthood 03/08/2019  . Numbness and tingling of both feet 01/27/2019  . Ataxia 12/17/2018  . 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 BPH without obstruction/lower urinary tract symptoms 11/25/2018  . Genital herpes 11/25/2018  . Osteoarthritis of right knee 11/18/2018  . Total knee replacement status, right 11/12/2018  . Peripheral neuropathy 09/02/2018  . Degeneration of lumbar intervertebral disc 08/17/2018  . Obstructive sleep apnea syndrome 08/17/2018  . Edema of both feet 07/28/2018  . Weakness of both lower extremities 07/16/2018  . Lymphocytic colitis 08/13/2016  . Aortic valve sclerosis 08/13/2016  . Hearing loss of left ear due to cerumen impaction 08/13/2016  . Diverticulosis of large intestine without diverticulitis   . Medicare annual wellness visit, subsequent 07/13/2015  . Advanced care planning/counseling discussion 07/13/2015  . Lumbar disc herniation with radiculopathy 05/26/2014  . Vitamin D deficiency 07/14/2013  . Healthcare maintenance 07/10/2012  . CKD stage 3 due to type 2 diabetes mellitus ([place])   . DJD (degenerative joint disease)   . Diabetes mellitus type 2, controlled, with complications ([place])   . Hypertension associated with type 2 diabetes mellitus ([place])   . Dyslipidemia associated with type 2 diabetes mellitus ([place])   . CAD (coronary artery disease)   . Severe obesity (BMI 35.0-39.9) with comorbidity (  [facility])     Past Surgical History:  Procedure Laterality Date  . APPENDECTOMY  1958  . CLOSED REDUCTION CLAVICLE FRACTURE  1955  . COLONOSCOPY  02/2008   int hemorrhoids, diverticula (Dr. Allen Norris)  . COLONOSCOPY N/A 08/26/2018   TA, rpt 3 yrs (Vanga, [place])  . COLONOSCOPY WITH PROPOFOL N/A 11/10/2015   diverticulosis, path with microscopic colitis [name], MD)  . CORONARY ANGIOPLASTY WITH STENT PLACEMENT  2005   stent 2005  . DECOMPRESSIVE LUMBAR LAMINECTOMY LEVEL 1  03/2018   herniated L4/5  disc R with free fargment over L4 s/p surgery L4 (K[place]inksi)  . ESOPHAGOGASTRODUODENOSCOPY N/A 08/26/2018   reactive gastritis with benign biopsies (Vanga, Rohini Reddy)  . HERNIA REPAIR  1956   inguinal  . KNEE ARTHROSCOPY  2008   torn meniscus  . SUBDURAL HEMATOMA EVACUATION VIA CRANIOTOMY  1964   hit in helmet by baseball  . TOTAL KNEE ARTHROPLASTY Right 11/12/2018   Procedure: TOTAL KNEE ARTHROPLASTY;  Surgeon: Thornton Park, MD;  Location: [facility] ORS;  Service: Orthopedics;  Laterality: Right;    Prior to Admission medications   Medication Sig Start Date End Date Taking? Authorizing Provider  Acetaminophen 500 MG coapsule Take 1,000 mg by mouth as needed.  11/24/18   [provider]  acyclovir (ZOVIRAX) 400 MG tablet Take 1 tablet (400 mg total) by mouth daily. 03/08/19   Ria Bush, MD  allopurinol (ZYLOPRIM) 100 MG tablet Take 1 tablet (100 mg total) by mouth daily. 03/08/19   Ria Bush, MD  aspirin EC 81 MG tablet Take 1 tablet (81 mg total) by mouth daily. 03/08/19   Ria Bush, MD  atenolol-chlorthalidone (TENORETIC) 50-25 MG tablet Take 1 tablet by mouth daily. 03/08/19   Ria Bush, MD  b complex vitamins tablet Take 1 tablet by mouth daily.    [provider]  Biotin 1000 MCG tablet Take 1,000 mcg by mouth daily.     [provider]  cholecalciferol (VITAMIN D3) 25 MCG (1000 UT) tablet Take 1,000 Units by mouth daily.  11/18/18   [provider]  furosemide (LASIX) 20 MG tablet  03/22/19   [provider]  lovastatin (MEVACOR) 40 MG tablet Take 2 tablets (80 mg total) by mouth at bedtime. 06/15/19   Ria Bush, MD  Magnesium 500 MG TABS Take 500 mg by mouth daily.    [provider]  metFORMIN (GLUCOPHAGE) 500 MG tablet TAKE 1 TABLET THREE TIMES DAILY BEFORE MEALS 06/07/19   Ria Bush, MD  methylPREDNISolone (MEDROL DOSEPAK) 4 MG TBPK tablet FPD 08/02/19   [provider]   Multiple Vitamin (MULTIVITAMIN) tablet Take 1 tablet by mouth daily.    [provider]  NON FORMULARY Diet Type:  Regular consistency, NCS 12/02/18   [provider]  potassium chloride SA (K-DUR) 20 MEQ tablet Take 1 tablet (20 mEq total) by mouth 2 (two) times daily. 03/08/19   Ria Bush, MD  vitamin C (ASCORBIC ACID) 500 MG tablet Take 500 mg by mouth daily.    [provider]    Allergies Gabapentin, Bactrim [sulfamethoxazole-trimethoprim], Penicillins, and Lasix [furosemide]  Family History  Problem Relation Age of Onset  . Diabetes Father 38  . Coronary artery disease Sister        catheterizations  . COPD Brother   . Stroke Brother   . Hypertension Brother   . Cancer Neg Hx     Social History Social History   Tobacco Use  . Smoking status:  Never Smoker  . Smokeless tobacco: Never Used  Substance Use Topics  . Alcohol use: No    Alcohol/week: 0.0 standard drinks  . Drug use: No      Review of Systems Constitutional: No fever/chills Eyes: No visual changes. ENT: No sore throat. Cardiovascular: Denies chest pain. Respiratory: Denies shortness of breath. Gastrointestinal: Positive abdominal pain and diarrhea Genitourinary: Negative for dysuria. Musculoskeletal: Negative for back pain. Skin: Negative for rash. Neurological: Negative for headaches, focal weakness or numbness. All other ROS negative ____________________________________________   PHYSICAL EXAM:  VITAL SIGNS: ED Triage Vitals  Enc Vitals Group     BP 08/24/19 1224 124/69     Pulse Rate 08/24/19 1224 (!) 58     Resp 08/24/19 1224 16     Temp 08/24/19 1224 98.4 F (36.9 C)     Temp Source 08/24/19 1224 Oral     SpO2 --      Weight 08/24/19 1222 228 lb (103.4 kg)     Height 08/24/19 1222 5\' 7"  (1.702 m)     Head Circumference --      Peak Flow --      Pain Score 08/24/19 1222 7     Pain Loc --      Pain Edu? --      Excl. in [place]? --     Constitutional:  Alert and oriented. Well appearing and in no acute distress. Eyes: Conjunctivae are normal. EOMI. Head: Atraumatic. Nose: No congestion/rhinnorhea. Mouth/Throat: Mucous membranes are dry.   Neck: No stridor. Trachea Midline. FROM Cardiovascular: Normal rate, regular rhythm. Grossly normal heart sounds.  Good peripheral circulation. Respiratory: Normal respiratory effort.  No retractions. Lungs CTAB. Gastrointestinal: Soft and nontender. No distention. No abdominal bruits.  Musculoskeletal: No lower extremity tenderness nor edema.  No joint effusions. Neurologic:  Normal speech and language. No gross focal neurologic deficits are appreciated.  Skin:  Skin is warm, dry and intact. No rash noted. Psychiatric: Mood and affect are normal. Speech and behavior are normal. GU: Deferred   ____________________________________________   LABS (all labs ordered are listed, but only abnormal results are displayed)  Labs Reviewed  COMPREHENSIVE METABOLIC PANEL - Abnormal; Notable for the following components:      Result Value   Sodium 134 (*)    Potassium 2.8 (*)    Chloride 93 (*)    Glucose, Bld 176 (*)    Calcium 8.4 (*)    Albumin 2.9 (*)    GFR calc non Af Amer 59 (*)    Anion gap 19 (*)    All other components within normal limits  CBC - Abnormal; Notable for the following components:   WBC 11.5 (*)    All other components within normal limits  GI PATHOGEN PANEL BY PCR, STOOL  C DIFFICILE QUICK SCREEN W PCR REFLEX  SARS CORONAVIRUS 2 (TAT 6-24 HRS)  LIPASE, BLOOD  MAGNESIUM  URINALYSIS, COMPLETE (UACMP) WITH MICROSCOPIC   ____________________________________________    INITIAL IMPRESSION / ASSESSMENT AND PLAN / ED COURSE  Thomas Carlson was evaluated in Emergency Department on 08/24/2019 for the symptoms described in the history of present illness. He was evaluated in the context of the global COVID-19 pandemic, which necessitated consideration that the patient might be at  risk for infection with the SARS-CoV-2 virus that causes COVID-19. Institutional protocols and algorithms that pertain to the evaluation of patients at risk for COVID-19 are in a state of rapid change based on information released by regulatory bodies including  the [employer] and federal and state organizations. These policies and algorithms were followed during the patient's care in the ED.    Patient is a 73 year old with known lymphocytic colitis who presents with diarrhea.  Will get labs to evaluate for AKI, dehydration, electrolyte abnormalities.  Will send off stool sample stool for virus, bacterial or C. difficile.  Denies any recent antibiotic use.  Patient is not tender on examination at this time so we will hold off on CT scan.  Labs notable for potassium of 2.8 and an elevated anion gap of 19 concerning for dehydration from his diarrhea.  Kidney function is at baseline.  White count slightly elevated 11.5  Given 1 L of fluid and potassium.  3:59 PM d/w Dr. Marius Ditch given abnormalities recommend admission and f/u GI studies.   Discussed with the hospital team for admission   ____________________________________________   FINAL CLINICAL IMPRESSION(S) / ED DIAGNOSES   Final diagnoses:  Diarrhea, unspecified type  Hypokalemia  Dehydration      MEDICATIONS GIVEN DURING THIS VISIT:  Medications  potassium chloride 10 mEq in 100 mL IVPB (has no administration in time range)  sodium chloride 0.9 % bolus 1,000 mL (has no administration in time range)  ketorolac (TORADOL) 30 MG/ML injection 15 mg (has no administration in time range)  acetaminophen (TYLENOL) tablet 650 mg (has no administration in time range)  0.9 % NaCl with KCl 20 mEq/ L  infusion (has no administration in time range)  potassium chloride SA (KLOR-CON) CR tablet 40 mEq (has no administration in time range)     ED Discharge Orders    None       Note:  This document was prepared using Dragon voice recognition  software and may include unintentional dictation errors.   Vanessa Ramsey, MD 08/24/19 [phone]

## 2019-08-24 NOTE — ED Notes (Signed)
Admitting at bedside 

## 2019-08-24 NOTE — ED Notes (Signed)
Pt's wife updated on pt status  

## 2019-08-24 NOTE — H&P (Addendum)
[facility] [facility] [place] NAME: Thomas Carlson    MR#:  [id number]  DATE OF BIRTH:  13-Feb-1946  DATE OF ADMISSION:  08/24/2019  PRIMARY CARE PHYSICIAN: Ria Bush, MD   REQUESTING/REFERRING PHYSICIAN: Dr. Jari Pigg  CHIEF COMPLAINT:   Chief Complaint  Patient presents with  . Abdominal Pain  . Diarrhea    HISTORY OF PRESENT ILLNESS:  Thomas Carlson  is a 73 y.o. male with a known history of lymphocytic colitis, hypertension, diabetes mellitus, CAD presents to the emergency room sent in by his GI specialist due to ongoing diarrhea.  Patient has been on budesonide for over a year and has not helped his diarrhea and was stopped 5 days back by his GI physician.  He was also asked to stop any artificial sweeteners and sodas.  He has abdominal cramping but no pain.  No blood in stool or nausea or vomiting.  Afebrile.  No recent antibiotic use.  Patient has 10-15 episodes of liquid stools every day.  He is on potassium twice daily dosing and still has significant hypokalemia today at 2.8.  Looks dehydrated.  PAST MEDICAL HISTORY:   Past Medical History:  Diagnosis Date  . Acute posthemorrhagic anemia   . Aortic valve sclerosis 08/13/2016   Sclerosis without stenosis by [place] (08/2016)  . CAD (coronary artery disease) 2005   s/p stent ([place])  . Cholelithiasis    by CT  . Chronic kidney disease, stage 3, mod decreased GFR   . Diabetes mellitus ([place]) 2010  . Diverticulosis    by CT  . DJD (degenerative joint disease)    knee  . Genital herpes   . Gout   . Heart murmur    followed by PCP  . History of arterial ischemic stroke 03/08/2019   Remote anterior frontal and central MCA stroke by MRI 2020 [place] [facility])  . HLD (hyperlipidemia)   . HTN (hypertension)   . Knee pain, right    "needs replacement"  . Neuropathy    feet  . Obesity   . Seizures ([place])    x1 - after craniotomy (1960s)  . Weakness of both legs    "since back surgery"    PAST  SURGICAL HISTORY:   Past Surgical History:  Procedure Laterality Date  . APPENDECTOMY  1958  . CLOSED REDUCTION CLAVICLE FRACTURE  1955  . COLONOSCOPY  02/2008   int hemorrhoids, diverticula (Dr. Allen Norris)  . COLONOSCOPY N/A 08/26/2018   TA, rpt 3 yrs (Vanga, [place])  . COLONOSCOPY WITH PROPOFOL N/A 11/10/2015   diverticulosis, path with microscopic colitis [name], MD)  . CORONARY ANGIOPLASTY WITH STENT PLACEMENT  2005   stent 2005  . DECOMPRESSIVE LUMBAR LAMINECTOMY LEVEL 1  03/2018   herniated L4/5 disc R with free fargment over L4 s/p surgery L4 ([place])  . ESOPHAGOGASTRODUODENOSCOPY N/A 08/26/2018   reactive gastritis with benign biopsies (Vanga, Rohini Reddy)  . HERNIA REPAIR  1956   inguinal  . KNEE ARTHROSCOPY  2008   torn meniscus  . SUBDURAL HEMATOMA EVACUATION VIA CRANIOTOMY  1964   hit in helmet by baseball  . TOTAL KNEE ARTHROPLASTY Right 11/12/2018   Procedure: TOTAL KNEE ARTHROPLASTY;  Surgeon: Thornton Park, MD;  Location: [facility] ORS;  Service: Orthopedics;  Laterality: Right;    SOCIAL HISTORY:   Social History   Tobacco Use  . Smoking status: Never Smoker  . Smokeless tobacco: Never Used  Substance Use Topics  . Alcohol use: No  Alcohol/week: 0.0 standard drinks    FAMILY HISTORY:   Family History  Problem Relation Age of Onset  . Diabetes Father 11  . Coronary artery disease Sister        catheterizations  . COPD Brother   . Stroke Brother   . Hypertension Brother   . Cancer Neg Hx     DRUG ALLERGIES:   Allergies  Allergen Reactions  . Gabapentin Other (See Comments)    unknown  . Bactrim [Sulfamethoxazole-Trimethoprim] Rash    Diffuse drug reaction - maculopapular rash  . Penicillins Hives    Has patient had a PCN reaction causing immediate rash, facial/tongue/throat swelling, SOB or lightheadedness with hypotension: Unknown Has patient had a PCN reaction causing severe rash involving mucus membranes or skin necrosis:  Unknown Has patient had a PCN reaction that required hospitalization: Unknown Has patient had a PCN reaction occurring within the last 10 years: Unknown If all of the above answers are "NO", then may proceed with Cephalosporin use.   . Lasix [Furosemide] Other (See Comments)    Drops blood pressure and drained potassium and magnesium    REVIEW OF SYSTEMS:   Review of Systems  Constitutional: Positive for malaise/fatigue. Negative for chills, fever and weight loss.  HENT: Negative for hearing loss and nosebleeds.   Eyes: Negative for blurred vision, double vision and pain.  Respiratory: Negative for cough, hemoptysis, sputum production, shortness of breath and wheezing.   Cardiovascular: Negative for chest pain, palpitations, orthopnea and leg swelling.  Gastrointestinal: Positive for diarrhea. Negative for abdominal pain, constipation, nausea and vomiting.  Genitourinary: Negative for dysuria and hematuria.  Musculoskeletal: Negative for back pain, falls and myalgias.  Skin: Negative for rash.  Neurological: Negative for dizziness, tremors, sensory change, speech change, focal weakness, seizures and headaches.  Endo/Heme/Allergies: Does not bruise/bleed easily.  Psychiatric/Behavioral: Negative for depression and memory loss. The patient is not nervous/anxious.     MEDICATIONS AT HOME:   Prior to Admission medications   Medication Sig Start Date End Date Taking? Authorizing Provider  allopurinol (ZYLOPRIM) 100 MG tablet Take 1 tablet (100 mg total) by mouth daily. 03/08/19  Yes Ria Bush, MD  Acetaminophen 500 MG coapsule Take 1,000 mg by mouth as needed.  11/24/18   [provider]  acyclovir (ZOVIRAX) 400 MG tablet Take 1 tablet (400 mg total) by mouth daily. 03/08/19   Ria Bush, MD  aspirin EC 81 MG tablet Take 1 tablet (81 mg total) by mouth daily. 03/08/19   Ria Bush, MD  atenolol-chlorthalidone (TENORETIC) 50-25 MG tablet Take 1 tablet by mouth  daily. 03/08/19   Ria Bush, MD  b complex vitamins tablet Take 1 tablet by mouth daily.    [provider]  Biotin 1000 MCG tablet Take 1,000 mcg by mouth daily.     [provider]  cholecalciferol (VITAMIN D3) 25 MCG (1000 UT) tablet Take 1,000 Units by mouth daily.  11/18/18   [provider]  furosemide (LASIX) 20 MG tablet  03/22/19   [provider]  furosemide (LASIX) 20 MG tablet Take 1,300 mg by mouth daily.    [provider]  lovastatin (MEVACOR) 40 MG tablet Take 2 tablets (80 mg total) by mouth at bedtime. 06/15/19   Ria Bush, MD  Magnesium 500 MG TABS Take 500 mg by mouth daily.    [provider]  metFORMIN (GLUCOPHAGE) 500 MG tablet TAKE 1 TABLET THREE TIMES DAILY BEFORE MEALS 06/07/19   Ria Bush, MD  methylPREDNISolone (  MEDROL DOSEPAK) 4 MG TBPK tablet FPD 08/02/19   [provider]  Multiple Vitamin (MULTIVITAMIN) tablet Take 1 tablet by mouth daily.    [provider]  NON FORMULARY Diet Type:  Regular consistency, NCS 12/02/18   [provider]  potassium chloride SA (K-DUR) 20 MEQ tablet Take 1 tablet (20 mEq total) by mouth 2 (two) times daily. 03/08/19   Ria Bush, MD  vitamin C (ASCORBIC ACID) 500 MG tablet Take 500 mg by mouth daily.    [provider]     VITAL SIGNS:  Blood pressure (!) 113/57, pulse 79, temperature 98.4 F (36.9 C), temperature source Oral, resp. rate (!) 25, height 5\' 7"  (1.702 m), weight 103.4 kg, SpO2 94 %.  PHYSICAL EXAMINATION:  Physical Exam  GENERAL:  73 y.o.-year-old patient lying in the bed with no acute distress.  Obese EYES: Pupils equal, round, reactive to light and accommodation. No scleral icterus. Extraocular muscles intact.  HEENT: Head atraumatic, normocephalic. Oropharynx and nasopharynx clear. No oropharyngeal erythema, moist oral mucosa  NECK:  Supple, no jugular venous distention. No thyroid enlargement, no  tenderness.  LUNGS: Normal breath sounds bilaterally, no wheezing, rales, rhonchi. No use of accessory muscles of respiration.  CARDIOVASCULAR: S1, S2 normal. No murmurs, rubs, or gallops.  ABDOMEN: Soft, nontender, nondistended. Bowel sounds present. No organomegaly or mass.  EXTREMITIES: No pedal edema, cyanosis, or clubbing. + 2 pedal & radial pulses b/l.   NEUROLOGIC: Cranial nerves II through XII are intact. No focal Motor or sensory deficits appreciated b/l PSYCHIATRIC: The patient is alert and oriented x 3. Good affect.  SKIN: No obvious rash, lesion, or ulcer.   LABORATORY PANEL:   CBC Recent Labs  Lab 08/24/19 1232  WBC 11.5*  HGB 14.2  HCT 41.0  PLT 289   ------------------------------------------------------------------------------------------------------------------  Chemistries  Recent Labs  Lab 08/24/19 1232  NA 134*  K 2.8*  CL 93*  CO2 22  GLUCOSE 176*  BUN 20  CREATININE 1.20  CALCIUM 8.4*  MG 2.0  AST 19  ALT 23  ALKPHOS 82  BILITOT 1.1   ------------------------------------------------------------------------------------------------------------------  Cardiac Enzymes No results for input(s): TROPONINI in the last 168 hours. ------------------------------------------------------------------------------------------------------------------  RADIOLOGY:  No results found.   IMPRESSION AND PLAN:   *Worsening chronic diarrhea secondary to lymphocytic colitis.  Need to rule out infection.  Check C. difficile and stool PCR.  Isolation ordered.  No antibiotics at this time.  Will hold Imodium from home.  Will not start steroids till infection is ruled out. I have also stop Metformin to see if it helps his diarrhea. Consult patient's GI physician Dr. Marius Ditch.  Made aware from ER.  *Hypokalemia.  Medical floor with telemetry monitoring.  Replace aggressively orally and IV.  We will continue twice daily dosing from tomorrow.  Repeat labs.  Check magnesium  level.  *Hypertension.  Continue home medications.  *Diabetes mellitus.  Stop Metformin due to diarrhea.  Sliding scale insulin ordered.  Check hemoglobin A1c  *Hypovolemic hyponatremia secondary to dehydration.  Start IV fluids.  Repeat labs in the morning.  *DVT prophylaxis with Lovenox  All the records are reviewed and case discussed with ED provider. Management plans discussed with the patient, family and they are in agreement.  CODE STATUS: Full code  TOTAL TIME TAKING CARE OF THIS PATIENT: 35 minutes.   Leia Alf Debrah Granderson M.D on 08/24/2019 at 4:35 PM  Between 7am to 6pm - Pager - [phone]  After 6pm go to [email or web address] -  password [email or web address] [facility]  Office  [phone]  CC: Primary care physician; Ria Bush, MD  Note: This dictation was prepared with Dragon dictation along with smaller phrase technology. Any transcriptional errors that result from this process are unintentional.

## 2019-08-24 NOTE — ED Notes (Signed)
Pt reports lower abdominal cramping x1 week that is worse when mobile. Pt reports chronic diarrhea, pt denies blood in diarrhea at this time. No NV. Pt states he hasn't had an appetite since Wednesday 10/21, but he has been able to drink fluids.

## 2019-08-24 NOTE — ED Notes (Signed)
Asked pt about taking Novolog. Pt denies taking insulin. Insulin was not given at 1700. Will continue to hold.

## 2019-08-24 NOTE — Consult Note (Signed)
Thomas Darby, MD [address]  [place]  [place], [place] [address]  Main: [phone]  Fax: [phone] Pager: [phone]   Consultation  Referring Provider:     No ref. provider found Primary Care Physician:  Ria Bush, MD Primary Gastroenterologist:  Dr. Sherri Sear         Reason for Consultation:     Acute diarrhea  Date of Admission:  08/24/2019 Date of Consultation:  08/24/2019         HPI:   Thomas Carlson is a 73 y.o. male with coronary artery disease, metabolic syndrome, history of lymphocytic colitis as well as bleeding from internal hemorrhoids s/p ligation x3.  Patient recently saw me on 10/21 secondary to flareup of diarrhea without any other constitutional symptoms.  At that time, he reported intake of carbonated beverages and he told me that budesonide is no longer working and it is expensive.  I have suggested him to eliminate carbonated beverages and hold off on budesonide.  However, he called my office yesterday due to worsening of diarrhea associated with abdominal cramps.  He has been having nonbloody bowel movements almost every 2 hours. Recommended stool studies.  He said he dropped off a stool sample yesterday however due to ongoing severe diarrhea with abdominal cramps, he came to ER today.  He denies fever, chills, nausea or vomiting.  He is found to have mild leukocytosis, 11.5,  hypokalemia 2.8.  Due to dehydration and electrolyte abnormalities, patient is admitted for further work-up   NSAIDs:None  Antiplts/Anticoagulants/Anti thrombotics:None  GI Procedures:Colonoscopy by Dr. Allen Norris 10/2015 - The examined portion of the ileum was normal. Biopsied. - Diverticulosis in the sigmoid colon. - Non-bleeding internal hemorrhoids. - Random biopsies were obtained in the entire colon. Diagnosis 1. Colon, biopsy, terminal ileum - BENIGN SMALL BOWEL MUCOSA. NO VILLOUS ATROPHY, INFLAMMATION OR OTHER ABNORMALITIES PRESENT. 2. Colon,  biopsy, random colon - BENIGN COLONIC MUCOSA WITH FEATURES CONSISTENT WITH MICROSCOPIC COLITIS, SEE COMMENT.  EGD 08/26/2018 - Duodenitis. - Normal second portion of the duodenum. - Non-bleeding erosive gastropathy. Biopsied. - Normal cardia, gastric fundus, gastric body and incisura. Biopsied. - Esophagogastric landmarks identified. - Normal gastroesophageal junction and esophagus.  Colonoscopy 08/26/2018 - Hemorrhoids found on perianal exam. - The examined portion of the ileum was normal. - Two 6 to 8 mm polyps in the transverse colon, removed with a hot snare. Resected and retrieved. - Severe diverticulosis in the sigmoid colon. There was no evidence of diverticular bleeding. - Non-bleeding external and internal hemorrhoids, with stigmata of recent bleeding, likely source of rectal Bleeding.  DIAGNOSIS:  A. STOMACH, RANDOM; COLD BIOPSY:  - ANTRAL MUCOSA WITH MILD REACTIVE GASTRITIS.  - UNREMARKABLE OXYNTIC MUCOSA.  - NEGATIVE FOR H. PYLORI, DYSPLASIA, AND MALIGNANCY.   B. COLON POLYP X2, TRANSVERSE; HOT SNARE:  - TUBULAR ADENOMA (MULTIPLE FRAGMENTS).  - NEGATIVE FOR HIGH-GRADE DYSPLASIA AND MALIGNANCY.   Past Medical History:  Diagnosis Date   Acute posthemorrhagic anemia    Aortic valve sclerosis 08/13/2016   Sclerosis without stenosis by [place] (08/2016)   CAD (coronary artery disease) 2005   s/p stent ([place])   Cholelithiasis    by CT   Chronic kidney disease, stage 3, mod decreased GFR    Diabetes mellitus ([place]) 2010   Diverticulosis    by CT   DJD (degenerative joint disease)    knee   Genital herpes    Gout    Heart murmur    followed by  PCP   History of arterial ischemic stroke 03/08/2019   Remote anterior frontal and central MCA stroke by MRI 2020 [facility])   HLD (hyperlipidemia)    HTN (hypertension)    Knee pain, right    "needs replacement"   Neuropathy    feet   Obesity    Seizures ([employer])    x1 - after craniotomy (1960s)    Weakness of both legs    "since back surgery"    Past Surgical History:  Procedure Laterality Date   [place]   COLONOSCOPY  02/2008   int hemorrhoids, diverticula (Dr. Allen Norris)   COLONOSCOPY N/A 08/26/2018   TA, rpt 3 yrs (Lazarius Rivkin, Tally Due)   COLONOSCOPY WITH PROPOFOL N/A 11/10/2015   diverticulosis, path with microscopic colitis [name], MD)   CORONARY ANGIOPLASTY WITH STENT PLACEMENT  2005   stent 2005   DECOMPRESSIVE LUMBAR LAMINECTOMY LEVEL 1  03/2018   herniated L4/5 disc R with free fargment over L4 s/p surgery L4 ([place])   ESOPHAGOGASTRODUODENOSCOPY N/A 08/26/2018   reactive gastritis with benign biopsies ([facility] Red[facility])   HERNIA REPAIR  1956   inguinal   KNEE ARTHROSCOPY  2008   torn meniscus   SUBDURAL HEMATOMA EVACUATION VIA CRANIOTOMY  1964   hit in helmet by baseball   TOTAL KNEE ARTHROPLASTY Right 11/12/2018   Procedure: TOTAL KNEE ARTHROPLASTY;  Surgeon: Thornton Park, MD;  Location: [facility] ORS;  Service: Orthopedics;  Laterality: Right;    Prior to Admission medications   Medication Sig Start Date End Date Taking? Authorizing Provider  allopurinol (ZYLOPRIM) 100 MG tablet Take 1 tablet (100 mg total) by mouth daily. 03/08/19  Yes Ria Bush, MD  Acetaminophen 500 MG coapsule Take 1,000 mg by mouth as needed.  11/24/18   [provider]  acyclovir (ZOVIRAX) 400 MG tablet Take 1 tablet (400 mg total) by mouth daily. 03/08/19   Ria Bush, MD  aspirin EC 81 MG tablet Take 1 tablet (81 mg total) by mouth daily. 03/08/19   Ria Bush, MD  atenolol-chlorthalidone (TENORETIC) 50-25 MG tablet Take 1 tablet by mouth daily. 03/08/19   Ria Bush, MD  b complex vitamins tablet Take 1 tablet by mouth daily.    [provider]  Biotin 1000 MCG tablet Take 1,000 mcg by mouth daily.     [provider]  cholecalciferol (VITAMIN D3) 25 MCG (1000 UT)  tablet Take 1,000 Units by mouth daily.  11/18/18   [provider]  furosemide (LASIX) 20 MG tablet  03/22/19   [provider]  furosemide (LASIX) 20 MG tablet Take 1,300 mg by mouth daily.    [provider]  lovastatin (MEVACOR) 40 MG tablet Take 2 tablets (80 mg total) by mouth at bedtime. 06/15/19   Ria Bush, MD  Magnesium 500 MG TABS Take 500 mg by mouth daily.    [provider]  metFORMIN (GLUCOPHAGE) 500 MG tablet TAKE 1 TABLET THREE TIMES DAILY BEFORE MEALS 06/07/19   Ria Bush, MD  methylPREDNISolone (MEDROL DOSEPAK) 4 MG TBPK tablet FPD 08/02/19   [provider]  Multiple Vitamin (MULTIVITAMIN) tablet Take 1 tablet by mouth daily.    [provider]  NON FORMULARY Diet Type:  Regular consistency, NCS 12/02/18   [provider]  potassium chloride SA (K-DUR) 20 MEQ tablet Take 1 tablet (20 mEq total) by mouth 2 (two) times daily. 03/08/19   Ria Bush, MD  vitamin  C (ASCORBIC ACID) 500 MG tablet Take 500 mg by mouth daily.    [provider]   Current Facility-Administered Medications:    0.9 % NaCl with KCl 20 mEq/ L  infusion, , Intravenous, Continuous, Sudini, Srikar, MD   acetaminophen (TYLENOL) tablet 650 mg, 650 mg, Oral, Q6H PRN **OR** acetaminophen (TYLENOL) suppository 650 mg, 650 mg, Rectal, Q6H PRN, Sudini, Srikar, MD   albuterol (PROVENTIL) (2.5 MG/3ML) 0.083% nebulizer solution 2.5 mg, 2.5 mg, Nebulization, Q2H PRN, Sudini, Srikar, MD   budesonide (ENTOCORT EC) 24 hr capsule 9 mg, 9 mg, Oral, Daily, Steel Kerney, Tally Due, MD   enoxaparin (LOVENOX) injection 40 mg, 40 mg, Subcutaneous, Q24H, Sudini, Srikar, MD   insulin aspart (novoLOG) injection 0-9 Units, 0-9 Units, Subcutaneous, TID WC, Sudini, Srikar, MD   ondansetron (ZOFRAN) tablet 4 mg, 4 mg, Oral, Q6H PRN **OR** ondansetron (ZOFRAN) injection 4 mg, 4 mg, Intravenous, Q6H PRN, Sudini, Srikar, MD   potassium chloride 10 mEq  in 100 mL IVPB, 10 mEq, Intravenous, Q1 Hr x 3, Vanessa Webb, MD, Last Rate: 100 mL/hr at 08/24/19 1818, 10 mEq at 08/24/19 1818   potassium chloride SA (KLOR-CON) CR tablet 40 mEq, 40 mEq, Oral, Q4H, Sudini, Srikar, MD, 40 mEq at 08/24/19 1635   sodium chloride flush (NS) 0.9 % injection 3 mL, 3 mL, Intravenous, Q12H, Sudini, Srikar, MD   traMADol (ULTRAM) tablet 50 mg, 50 mg, Oral, Q6H PRN, Hillary Bow, MD  Current Outpatient Medications:    acetaminophen (TYLENOL) 500 MG tablet, Take 1,000 mg by mouth every 4 (four) hours as needed for fever or pain. , Disp: , Rfl:    acyclovir (ZOVIRAX) 400 MG tablet, Take 1 tablet (400 mg total) by mouth daily., Disp: 90 tablet, Rfl: 3   allopurinol (ZYLOPRIM) 100 MG tablet, Take 1 tablet (100 mg total) by mouth daily., Disp: 90 tablet, Rfl: 3   aspirin EC 81 MG tablet, Take 1 tablet (81 mg total) by mouth daily., Disp: , Rfl:    atenolol-chlorthalidone (TENORETIC) 50-25 MG tablet, Take 1 tablet by mouth daily., Disp: 90 tablet, Rfl: 3   b complex vitamins tablet, Take 1 tablet by mouth daily., Disp: , Rfl:    Biotin 1000 MCG tablet, Take 1,000 mcg by mouth daily. , Disp: , Rfl:    cholecalciferol (VITAMIN D3) 25 MCG (1000 UT) tablet, Take 1,000 Units by mouth daily. , Disp: , Rfl:    lovastatin (MEVACOR) 40 MG tablet, Take 2 tablets (80 mg total) by mouth at bedtime., Disp: 60 tablet, Rfl: 0   Magnesium 500 MG TABS, Take 500 mg by mouth daily., Disp: , Rfl:    metFORMIN (GLUCOPHAGE) 500 MG tablet, TAKE 1 TABLET THREE TIMES DAILY BEFORE MEALS (Patient taking differently: Take 500 mg by mouth 3 (three) times daily before meals. ), Disp: 270 tablet, Rfl: 1   Multiple Vitamin (MULTIVITAMIN) tablet, Take 1 tablet by mouth daily., Disp: , Rfl:    potassium chloride SA (K-DUR) 20 MEQ tablet, Take 1 tablet (20 mEq total) by mouth 2 (two) times daily., Disp: 180 tablet, Rfl: 3   vitamin C (ASCORBIC ACID) 500 MG tablet, Take 500 mg by mouth  daily., Disp: , Rfl:    Family History  Problem Relation Age of Onset   Diabetes Father 63   Coronary artery disease Sister        catheterizations   COPD Brother    Stroke Brother    Hypertension Brother    Cancer Neg Hx  Social History   Tobacco Use   Smoking status: Never Smoker   Smokeless tobacco: Never Used  Substance Use Topics   Alcohol use: No    Alcohol/week: 0.0 standard drinks   Drug use: No    Allergies as of 08/24/2019 - Review Complete 08/24/2019  Allergen Reaction Noted   Gabapentin Other (See Comments) 08/19/2018   Bactrim [sulfamethoxazole-trimethoprim] Rash 05/19/2013   Penicillins Hives 06/18/2012   Lasix [furosemide] Other (See Comments) 09/02/2018    Review of Systems:    All systems reviewed and negative except where noted in HPI.   Physical Exam:  Vital signs in last 24 hours: Temp:  [98.4 F (36.9 C)] 98.4 F (36.9 C) (10/27 1224) Pulse Rate:  [58-79] 59 (10/27 1800) Resp:  [14-29] 20 (10/27 1800) BP: (113-143)/(57-75) 118/67 (10/27 1800) SpO2:  [94 %-98 %] 98 % (10/27 1800) Weight:  [103.4 kg] 103.4 kg (10/27 1222)   General:   Pleasant, cooperative in NAD Head:  Normocephalic and atraumatic. Eyes:   No icterus.   Conjunctiva pink. PERRLA. Ears:  Normal auditory acuity. Neck:  Supple; no masses or thyroidomegaly Lungs: Respirations even and unlabored. Lungs clear to auscultation bilaterally.   No wheezes, crackles, or rhonchi.  Heart:  Regular rate and rhythm;  Without murmur, clicks, rubs or gallops Abdomen:  Soft, nondistended, nontender. Normal bowel sounds. No appreciable masses or hepatomegaly.  No rebound or guarding.  Rectal:  Not performed. Msk:  Symmetrical without gross deformities.  Strength normal Extremities:  Without edema, cyanosis or clubbing. Neurologic:  Alert and oriented x3;  grossly normal neurologically. Skin:  Intact without significant lesions or rashes. Psych:  Alert and cooperative.  Normal affect.  LAB RESULTS: CBC Latest Ref Rng & Units 08/24/2019 03/02/2019 12/25/2018  WBC 4.0 - 10.5 K/uL 11.5(H) 7.7 8.3  Hemoglobin 13.0 - 17.0 g/dL 14.2 13.0 12.7(L)  Hematocrit 39.0 - 52.0 % 41.0 38.8(L) 37.9(L)  Platelets 150 - 400 K/uL 289 272.0 252.0    BMET BMP Latest Ref Rng & Units 08/24/2019 03/02/2019 12/25/2018  Glucose 70 - 99 mg/dL 176(H) 103(H) 120(H)  BUN 8 - 23 mg/dL 20 16 34(H)  Creatinine 0.61 - 1.24 mg/dL 1.20 1.38 1.40  Sodium 135 - 145 mmol/L 134(L) 137 143  Potassium 3.5 - 5.1 mmol/L 2.8(L) 4.2 3.9  Chloride 98 - 111 mmol/L 93(L) 100 106  CO2 22 - 32 mmol/L 22 27 25   Calcium 8.9 - 10.3 mg/dL 8.4(L) 8.9 9.1    LFT Hepatic Function Latest Ref Rng & Units 08/24/2019 03/02/2019 12/25/2018  Total Protein 6.5 - 8.1 g/dL 6.7 5.5(L) -  Albumin 3.5 - 5.0 g/dL 2.9(L) 3.6 3.9  AST 15 - 41 U/L 19 12 -  ALT 0 - 44 U/L 23 9 -  Alk Phosphatase 38 - 126 U/L 82 60 -  Total Bilirubin 0.3 - 1.2 mg/dL 1.1 0.4 -  Bilirubin, Direct 0.0 - 0.2 mg/dL - - -     STUDIES: No results found.    Impression / Plan:   ROSTON GRUNEWALD is a 73 y.o. male with metabolic syndrome, lymphocytic colitis admitted with 1 week history of acute nonbloody diarrhea and lower abdominal cramps, labs revealed hypokalemia, mild leukocytosis  Recommend stool studies to evaluate for C. difficile and other GI pathogens Patient had history of adenovirus in the past Restart budesonide 9 mg daily for lymphocytic colitis Replete potassium, serum magnesium normal Endoscopic evaluation based on above work-up Hold off on abdominal imaging at this time  Thank  you for involving me in the care of this patient.      LOS: 0 days   Sherri Sear, MD  08/24/2019, 7:11 PM   Note: This dictation was prepared with Dragon dictation along with smaller phrase technology. Any transcriptional errors that result from this process are unintentional.

## 2019-08-24 NOTE — Progress Notes (Signed)
Advance care planning  Purpose of Encounter Diarrhea, hypokalemia, lymphocytic colitis  Parties in Attendance Patient, daughter at bedside  Patients Decisional capacity Alert and oriented.  Able to make medical decisions.  His medical power of attorney is his wife-Thomas Carlson,Thomas Carlson  Discussed in detail regarding diarrhea, hypokalemia, lymphocytic colitis.  Treatment plan , prognosis discussed.  All questions answered  CODE STATUS discussed.  Patient initially mention he did not want ventilator or CPR.  But later after discussing with daughter wants short-term interventions.  No long-term life support.  Orders entered and CODE STATUS changed  Full code  Time spent - 17 minutes
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
## 2019-08-24 NOTE — ED Triage Notes (Signed)
Patient reports lower abdominal cramping. Worse when sitting or standing. Also reports diarrhea. States this has been ongoing for over a year. Has GI with no resolution of symptoms.

## 2019-08-24 NOTE — ED Notes (Signed)
Pt given [place] sandwich tray and sprite.

## 2019-08-25 DIAGNOSIS — E876 Hypokalemia: Secondary | ICD-10-CM | POA: Diagnosis not present

## 2019-08-25 DIAGNOSIS — K52832 Lymphocytic colitis: Secondary | ICD-10-CM | POA: Diagnosis not present

## 2019-08-25 DIAGNOSIS — R197 Diarrhea, unspecified: Secondary | ICD-10-CM | POA: Diagnosis not present

## 2019-08-25 DIAGNOSIS — E119 Type 2 diabetes mellitus without complications: Secondary | ICD-10-CM | POA: Diagnosis not present

## 2019-08-25 DIAGNOSIS — E860 Dehydration: Secondary | ICD-10-CM | POA: Diagnosis not present

## 2019-08-25 LAB — URINALYSIS, COMPLETE (UACMP) WITH MICROSCOPIC
Bacteria, UA: NONE SEEN
Bilirubin Urine: NEGATIVE
Glucose, UA: 50 mg/dL — AB
Hgb urine dipstick: NEGATIVE
Ketones, ur: 20 mg/dL — AB
Leukocytes,Ua: NEGATIVE
Nitrite: NEGATIVE
Protein, ur: NEGATIVE mg/dL
Specific Gravity, Urine: 1.019 (ref 1.005–1.030)
pH: 5 (ref 5.0–8.0)

## 2019-08-25 LAB — BASIC METABOLIC PANEL
Anion gap: 13 (ref 5–15)
BUN: 20 mg/dL (ref 8–23)
CO2: 19 mmol/L — ABNORMAL LOW (ref 22–32)
Calcium: 7.2 mg/dL — ABNORMAL LOW (ref 8.9–10.3)
Chloride: 102 mmol/L (ref 98–111)
Creatinine, Ser: 1.06 mg/dL (ref 0.61–1.24)
GFR calc Af Amer: >60 mL/min (ref >60)
GFR calc non Af Amer: >60 mL/min (ref >60)
Glucose, Bld: 182 mg/dL — ABNORMAL HIGH (ref 70–99)
Potassium: 3.7 mmol/L (ref 3.5–5.1)
Sodium: 134 mmol/L — ABNORMAL LOW (ref 135–145)

## 2019-08-25 LAB — CBC
HCT: 32.6 % — ABNORMAL LOW (ref 39.0–52.0)
Hemoglobin: 11.4 g/dL — ABNORMAL LOW (ref 13.0–17.0)
MCH: 32.6 pg (ref 26.0–34.0)
MCHC: 35 g/dL (ref 30.0–36.0)
MCV: 93.1 fL (ref 80.0–100.0)
Platelets: 257 10*3/uL (ref 150–400)
RBC: 3.5 MIL/uL — ABNORMAL LOW (ref 4.22–5.81)
RDW: 14.4 % (ref 11.5–15.5)
WBC: 9.9 10*3/uL (ref 4.0–10.5)
nRBC: 0 % (ref 0.0–0.2)

## 2019-08-25 LAB — GLUCOSE, CAPILLARY
Glucose-Capillary: 185 mg/dL — ABNORMAL HIGH (ref 70–99)
Glucose-Capillary: 157 mg/dL — ABNORMAL HIGH (ref 70–99)

## 2019-08-25 LAB — C DIFFICILE QUICK SCREEN W PCR REFLEX
C Diff antigen: NEGATIVE
C Diff interpretation: NOT DETECTED
C Diff toxin: NEGATIVE

## 2019-08-25 MED ORDER — LOPERAMIDE HCL 2 MG PO TABS: 2.0000 mg | ORAL_TABLET | Freq: Four times a day (QID) | ORAL | 0 refills | Status: DC | PRN

## 2019-08-25 MED ORDER — DICYCLOMINE HCL 10 MG PO CAPS: 10.0000 mg | ORAL_CAPSULE | Freq: Three times a day (TID) | ORAL | 0 refills | Status: DC

## 2019-08-25 NOTE — Discharge Instructions (Addendum)
Resume diet and activity as before  You can take imodium 4 times a day for diarrhea.  Please restart your Budesonide from Dr. Lance Morin office

## 2019-08-25 NOTE — Progress Notes (Signed)
Patient refuses bed alarms, patient educated about safety.

## 2019-08-25 NOTE — Progress Notes (Signed)
Patient discharged home per MD order. All discharge instructions given and all questions answered. 

## 2019-08-26 ENCOUNTER — Telehealth: Payer: Self-pay

## 2019-08-26 NOTE — Telephone Encounter (Signed)
Transition Care Management Follow-up Telephone Call   Date discharged? 08/25/2019   How have you been since you were released from the hospital? Patient state he is doing ok, still has diarrhea. No change in symptoms but hopes the new medications will help. Has an appointment with GI on 09/13/2019. No new symptoms. NO vomiting or fever.   Do you understand why you were in the hospital? yes   Do you understand the discharge instructions? yes   Where were you discharged to? home   Items Reviewed:  Medications reviewed: yes  Allergies reviewed: yes  Dietary changes reviewed: yes  Referrals reviewed: yes   Functional Questionnaire:   Activities of Daily Living (ADLs):   He states they are independent in the following: ambulation, dressing, hygiene, toileting, bathing, grooming, fixing food and medication. States they require assistance with the following: not at this time   Any transportation issues/concerns?: No   Any patient concerns? not at this time   Confirmed importance and date/time of follow-up visits scheduled yes  Provider Appointment booked with Dr Darnell Level 09/03/2019  Confirmed with patient if condition begins to worsen call PCP or go to the ER.  Patient was given the office number and encouraged to call back with question or concerns.  : yes

## 2019-08-27 ENCOUNTER — Telehealth: Payer: Self-pay | Admitting: Gastroenterology

## 2019-08-27 LAB — GI PATHOGEN PANEL BY PCR, STOOL

## 2019-08-27 NOTE — Telephone Encounter (Signed)
Patient called & l/m on v/m asking for the results to his stool sample.Please call & advise

## 2019-08-27 NOTE — Telephone Encounter (Signed)
Pt has been notified of negative C-Diff results and GI Profile in progress, pt verbalized understanding

## 2019-08-28 NOTE — Discharge Summary (Signed)
[facility] NAME: Thomas Carlson    MR#:  [id number]  DATE OF BIRTH:  [birth year]  DATE OF ADMISSION:  08/24/2019 ADMITTING PHYSICIAN: Hillary Bow, MD  DATE OF DISCHARGE: 08/25/2019  1:31 PM  PRIMARY CARE PHYSICIAN: Ria Bush, MD   ADMISSION DIAGNOSIS:  Dehydration [E86.0] Hypokalemia [E87.6] Diarrhea, unspecified type [R19.7]  DISCHARGE DIAGNOSIS:  Active Problems:   Diarrhea  SECONDARY DIAGNOSIS:   Past Medical History:  Diagnosis Date  . Acute posthemorrhagic anemia   . Aortic valve sclerosis 08/13/2016   Sclerosis without stenosis by [place] (08/2016)  . CAD (coronary artery disease) 2005   s/p stent ([place])  . Cholelithiasis    by CT  . Chronic kidney disease, stage 3, mod decreased GFR   . Diabetes mellitus ([place]) 2010  . Diverticulosis    by CT  . DJD (degenerative joint disease)    knee  . Genital herpes   . Gout   . Heart murmur    followed by PCP  . History of arterial ischemic stroke 03/08/2019   Remote anterior frontal and central MCA stroke by MRI 2020 [facility])  . HLD (hyperlipidemia)   . HTN (hypertension)   . Knee pain, right    "needs replacement"  . Neuropathy    feet  . Obesity   . Seizures ([place])    x1 - after craniotomy (1960s)  . Weakness of both legs    "since back surgery"     ADMITTING HISTORY  HISTORY OF PRESENT ILLNESS:  Thomas Carlson  is a 73 y.o. male with a known history of lymphocytic colitis, hypertension, diabetes mellitus, CAD presents to the emergency room sent in by his GI specialist due to ongoing diarrhea.  Patient has been on budesonide for over a year and has not helped his diarrhea and was stopped 5 days back by his GI physician.  He was also asked to stop any artificial sweeteners and sodas.  He has abdominal cramping but no pain.  No blood in stool or nausea or vomiting.  Afebrile.  No recent antibiotic use.  Patient has 10-15 episodes of liquid stools every day.   He is on potassium twice daily dosing and still has significant hypokalemia today at 2.8.  Looks dehydrated.  HOSPITAL COURSE:   *Chronic diarrhea secondary to lymphocytic colitis *Hypokalemia *Hypertension *Diabetes mellitus *Hypovolemic hyponatremia  Patient was admitted to medical floor and started on IV fluids.  Aggressively replace potassium through IV and oral.  Patient sodium improved.  Dehydration resolved.  Patient by the day of discharge feels much improved.  C. difficile is negative.  Stool PCR pending.  Discussed with GI Dr. Marius Ditch who suggested discharging patient home on Imodium and Bentyl.  Patient will follow up with her as outpatient.  Afebrile and normal WBC with no abdominal pain at time of discharge.  CONSULTS OBTAINED:  Treatment Team:  Lin Landsman, MD  DRUG ALLERGIES:   Allergies  Allergen Reactions  . Gabapentin Other (See Comments)    unknown  . Bactrim [Sulfamethoxazole-Trimethoprim] Rash    Diffuse drug reaction - maculopapular rash  . Penicillins Hives    Has patient had a PCN reaction causing immediate rash, facial/tongue/throat swelling, SOB or lightheadedness with hypotension: Unknown Has patient had a PCN reaction causing severe rash involving mucus membranes or skin necrosis: Unknown Has patient had a PCN reaction that required hospitalization: Unknown Has patient had a PCN reaction occurring within the last 10 years: Unknown  If all of the above answers are "NO", then may proceed with Cephalosporin use.   . Lasix [Furosemide] Other (See Comments)    Drops blood pressure and drained potassium and magnesium    DISCHARGE MEDICATIONS:   Allergies as of 08/25/2019      Reactions   Gabapentin Other (See Comments)   unknown   Bactrim [sulfamethoxazole-trimethoprim] Rash   Diffuse drug reaction - maculopapular rash   Penicillins Hives   Has patient had a PCN reaction causing immediate rash, facial/tongue/throat swelling, SOB or  lightheadedness with hypotension: Unknown Has patient had a PCN reaction causing severe rash involving mucus membranes or skin necrosis: Unknown Has patient had a PCN reaction that required hospitalization: Unknown Has patient had a PCN reaction occurring within the last 10 years: Unknown If all of the above answers are "NO", then may proceed with Cephalosporin use.   Lasix [furosemide] Other (See Comments)   Drops blood pressure and drained potassium and magnesium      Medication List    TAKE these medications   acetaminophen 500 MG tablet Commonly known as: TYLENOL Take 1,000 mg by mouth every 4 (four) hours as needed for fever or pain.   acyclovir 400 MG tablet Commonly known as: ZOVIRAX Take 1 tablet (400 mg total) by mouth daily.   allopurinol 100 MG tablet Commonly known as: ZYLOPRIM Take 1 tablet (100 mg total) by mouth daily.   aspirin EC 81 MG tablet Take 1 tablet (81 mg total) by mouth daily.   atenolol-chlorthalidone 50-25 MG tablet Commonly known as: TENORETIC Take 1 tablet by mouth daily.   b complex vitamins tablet Take 1 tablet by mouth daily.   Biotin 1000 MCG tablet Take 1,000 mcg by mouth daily.   cholecalciferol 25 MCG (1000 UT) tablet Commonly known as: VITAMIN D3 Take 1,000 Units by mouth daily.   dicyclomine 10 MG capsule Commonly known as: Bentyl Take 1 capsule (10 mg total) by mouth 4 (four) times daily -  before meals and at bedtime.   loperamide 2 MG tablet Commonly known as: Imodium A-D Take 1 tablet (2 mg total) by mouth 4 (four) times daily as needed for diarrhea or loose stools.   lovastatin 40 MG tablet Commonly known as: MEVACOR Take 2 tablets (80 mg total) by mouth at bedtime.   Magnesium 500 MG Tabs Take 500 mg by mouth daily.   metFORMIN 500 MG tablet Commonly known as: GLUCOPHAGE TAKE 1 TABLET THREE TIMES DAILY BEFORE MEALS What changed: See the new instructions.   multivitamin tablet Take 1 tablet by mouth daily.    potassium chloride SA 20 MEQ tablet Commonly known as: KLOR-CON Take 1 tablet (20 mEq total) by mouth 2 (two) times daily.   vitamin C 500 MG tablet Commonly known as: ASCORBIC ACID Take 500 mg by mouth daily.       Today   VITAL SIGNS:  Blood pressure 123/63, pulse 84, temperature 98 F (36.7 C), temperature source Oral, resp. rate 18, height 5\' 6"  (1.676 m), weight 106.2 kg, SpO2 95 %.  I/O:  No intake or output data in the 24 hours ending 08/28/19 1306  PHYSICAL EXAMINATION:  Physical Exam  GENERAL:  73 y.o.-year-old patient lying in the bed with no acute distress.  LUNGS: Normal breath sounds bilaterally, no wheezing, rales,rhonchi or crepitation. No use of accessory muscles of respiration.  CARDIOVASCULAR: S1, S2 normal. No murmurs, rubs, or gallops.  ABDOMEN: Soft, non-tender, non-distended. Bowel sounds present. No organomegaly or mass.  NEUROLOGIC: Moves all 4 extremities. PSYCHIATRIC: The patient is alert and oriented x 3.  SKIN: No obvious rash, lesion, or ulcer.   DATA REVIEW:   CBC Recent Labs  Lab 08/25/19 0523  WBC 9.9  HGB 11.4*  HCT 32.6*  PLT 257    Chemistries  Recent Labs  Lab 08/24/19 1232 08/25/19 0523  NA 134* 134*  K 2.8* 3.7  CL 93* 102  CO2 22 19*  GLUCOSE 176* 182*  BUN 20 20  CREATININE 1.20 1.06  CALCIUM 8.4* 7.2*  MG 2.0  --   AST 19  --   ALT 23  --   ALKPHOS 82  --   BILITOT 1.1  --     Cardiac Enzymes No results for input(s): TROPONINI in the last 168 hours.  Microbiology Results  Results for orders placed or performed during the hospital encounter of 08/24/19  SARS CORONAVIRUS 2 (TAT 6-24 HRS) Nasopharyngeal Nasopharyngeal Swab     Status: None   Collection Time: 08/24/19  4:32 PM   Specimen: Nasopharyngeal Swab  Result Value Ref Range Status   SARS Coronavirus 2 NEGATIVE NEGATIVE Final    Comment: (NOTE) SARS-CoV-2 target nucleic acids are NOT DETECTED. The SARS-CoV-2 RNA is generally detectable in upper  and lower respiratory specimens during the acute phase of infection. Negative results do not preclude SARS-CoV-2 infection, do not rule out co-infections with other pathogens, and should not be used as the sole basis for treatment or other patient management decisions. Negative results must be combined with clinical observations, patient history, and epidemiological information. The expected result is Negative. Fact Sheet for Patients: SugarRoll.be Fact Sheet for Healthcare Providers: https://www.woods-mathews.com/ This test is not yet approved or cleared by the [place] FDA and  has been authorized for detection and/or diagnosis of SARS-CoV-2 by FDA under an Emergency Use Authorization (EUA). This EUA will remain  in effect (meaning this test can be used) for the duration of the COVID-19 declaration under Section 56 4(b)(1) of the Act, 21 U.S.C. section 360bbb-3(b)(1), unless the authorization is terminated or revoked sooner. Performed at [facility], [place] [address] [place], [place] [address]   GI pathogen panel by PCR, stool     Status: None   Collection Time: 08/25/19  3:48 AM   Specimen: STOOL  Result Value Ref Range Status   Plesiomonas shigelloides NOT DETECTED NOT DETECTED Final   Yersinia enterocolitica NOT DETECTED NOT DETECTED Final   Vibrio NOT DETECTED NOT DETECTED Final   Enteropathogenic E coli NOT DETECTED NOT DETECTED Final   E coli (ETEC) LT/ST NOT DETECTED NOT DETECTED Final   E coli [id number] by PCR Not applicable NOT DETECTED Final   Cryptosporidium by PCR NOT DETECTED NOT DETECTED Final   Entamoeba histolytica NOT DETECTED NOT DETECTED Final   Adenovirus F 40/41 NOT DETECTED NOT DETECTED Final   Norovirus GI/GII NOT DETECTED NOT DETECTED Final   Sapovirus NOT DETECTED NOT DETECTED Final    Comment: (NOTE) Performed At: [facility] [place], [place] [id number] Rush Farmer MD  [id number]    Vibrio cholerae NOT DETECTED NOT DETECTED Final   Campylobacter by PCR NOT DETECTED NOT DETECTED Final   Salmonella by PCR NOT DETECTED NOT DETECTED Final   E coli (STEC) NOT DETECTED NOT DETECTED Final   Enteroaggregative E coli NOT DETECTED NOT DETECTED Final   Shigella by PCR NOT DETECTED NOT DETECTED Final   Cyclospora cayetanensis NOT DETECTED NOT DETECTED Final  Astrovirus NOT DETECTED NOT DETECTED Final   G lamblia by PCR NOT DETECTED NOT DETECTED Final   Rotavirus A by PCR NOT DETECTED NOT DETECTED Final  C Difficile Quick Screen w PCR reflex     Status: None   Collection Time: 08/25/19  3:48 AM   Specimen: STOOL  Result Value Ref Range Status   C Diff antigen NEGATIVE NEGATIVE Final   C Diff toxin NEGATIVE NEGATIVE Final   C Diff interpretation No C. difficile detected.  Final    Comment: Performed at [facility], [address] [place], [place] [address]    RADIOLOGY:  No results found.  Follow up with PCP in 1 week.  Management plans discussed with the patient, family and they are in agreement.  CODE STATUS:  Code Status History    Date Active Date Inactive Code Status Order ID Comments User Context   08/24/2019 1633 08/25/2019 1641 Full Code [id number]  Hillary Bow, MD ED   11/12/2018 1234 11/17/2018 1648 Full Code [id number]  Thornton Park, MD Inpatient   08/23/2018 1743 08/26/2018 1638 Full Code [id number]  Mayo, Pete Pelt, MD Inpatient   Advance Care Planning Activity      TOTAL TIME TAKING CARE OF THIS PATIENT ON DAY OF DISCHARGE: more than 30 minutes.   Leia Alf Usha Slager M.D on 08/28/2019 at 1:06 PM  Between 7am to 6pm - Pager - [phone]  After 6pm go to [email or web address] - password [email or web address] [facility]  Office  [phone]  CC: Primary care physician; Ria Bush, MD  Note: This dictation was prepared with Dragon dictation along with smaller phrase technology. Any transcriptional errors that  result from this process are unintentional.

## 2019-08-30 ENCOUNTER — Telehealth: Payer: Self-pay

## 2019-08-30 NOTE — Telephone Encounter (Signed)
Patient is calling because he states he needs a refill on Budesonide because the provider wants him back on medication. He would like this sent to [facility]. Patient would like a call when this is called in

## 2019-08-31 ENCOUNTER — Other Ambulatory Visit: Payer: Self-pay

## 2019-08-31 MED ORDER — BUDESONIDE 3 MG PO CPEP: 9.0000 mg | ORAL_CAPSULE | Freq: Every day | ORAL | 0 refills | Status: DC

## 2019-08-31 NOTE — Telephone Encounter (Signed)
Medication has been refilled and sent to pharmacy, pt has been notified  

## 2019-09-01 ENCOUNTER — Other Ambulatory Visit: Payer: Self-pay

## 2019-09-01 LAB — GI PROFILE, STOOL, PCR

## 2019-09-01 LAB — PANCREATIC ELASTASE, FECAL: Pancreatic Elastase, Fecal: >500 ug Elast./g (ref >200)

## 2019-09-01 MED ORDER — BUDESONIDE 3 MG PO CPEP: 9.0000 mg | ORAL_CAPSULE | Freq: Two times a day (BID) | ORAL | 0 refills | Status: AC

## 2019-09-01 NOTE — Progress Notes (Signed)
Medication refill has been recent to [facility], previous refill was sent to error to wrong pharmacy, pt has been notified

## 2019-09-03 ENCOUNTER — Other Ambulatory Visit: Payer: Self-pay

## 2019-09-03 ENCOUNTER — Encounter: Payer: Self-pay | Admitting: Family Medicine

## 2019-09-03 ENCOUNTER — Ambulatory Visit (INDEPENDENT_AMBULATORY_CARE_PROVIDER_SITE_OTHER): Payer: Medicare HMO | Admitting: Family Medicine

## 2019-09-03 VITALS — BP 140/62 | HR 65 | Temp 97.7°F | Ht 66.0 in | Wt 233.4 lb

## 2019-09-03 DIAGNOSIS — E118 Type 2 diabetes mellitus with unspecified complications: Secondary | ICD-10-CM | POA: Diagnosis not present

## 2019-09-03 DIAGNOSIS — K52832 Lymphocytic colitis: Secondary | ICD-10-CM | POA: Diagnosis not present

## 2019-09-03 HISTORY — DX: Hypocalcemia: E83.51

## 2019-09-03 LAB — COMPREHENSIVE METABOLIC PANEL
ALT: 17 U/L (ref 0–53)
AST: 12 U/L (ref 0–37)
Albumin: 3.6 g/dL (ref 3.5–5.2)
Alkaline Phosphatase: 63 U/L (ref 39–117)
BUN: 26 mg/dL — ABNORMAL HIGH (ref 6–23)
CO2: 29 mEq/L (ref 19–32)
Calcium: 8.6 mg/dL (ref 8.4–10.5)
Chloride: 103 mEq/L (ref 96–112)
Creatinine, Ser: 1.11 mg/dL (ref 0.40–1.50)
GFR: 64.7 mL/min (ref >60.00)
Glucose, Bld: 132 mg/dL — ABNORMAL HIGH (ref 70–99)
Potassium: 3.6 mEq/L (ref 3.5–5.1)
Sodium: 142 mEq/L (ref 135–145)
Total Bilirubin: 0.4 mg/dL (ref 0.2–1.2)
Total Protein: 5.5 g/dL — ABNORMAL LOW (ref 6.0–8.3)

## 2019-09-03 LAB — PHOSPHORUS: Phosphorus: 2.5 mg/dL (ref 2.3–4.6)

## 2019-09-03 LAB — MAGNESIUM: Magnesium: 1.6 mg/dL (ref 1.5–2.5)

## 2019-09-03 MED ORDER — OZEMPIC (0.25 OR 0.5 MG/DOSE) 2 MG/1.5ML ~~LOC~~ SOPN: PEN_INJECTOR | SUBCUTANEOUS | 3 refills | Status: AC

## 2019-09-03 NOTE — Assessment & Plan Note (Signed)
Anticipate related to hypoalbuminemia and chronic diarrhea. Check PTH.

## 2019-09-03 NOTE — Assessment & Plan Note (Addendum)
Appreciate GI care. Difficult situation given poor response to treatment to date. Has f/u planned with GI later this month. ?metformin contribution - will try to taper down on dose to see effect.  He has eliminated artificial sweeteners from diet.

## 2019-09-03 NOTE — Assessment & Plan Note (Addendum)
Chronic, deteriorated. Will refer to DSME.  Discussed weekly GLP1 RA - will price out ozempic and if affordable, decrease metformin (to see effect on chronic diarrhea). No fmhx thyroid cancer.

## 2019-09-03 NOTE — Progress Notes (Signed)
This visit was conducted in person.  BP 140/62 (BP Location: Left Arm, Patient Position: Sitting, Cuff Size: Large)    Pulse 65    Temp 97.7 F (36.5 C) (Temporal)    Ht 5\' 6"  (1.676 m)    Wt 233 lb 6 oz (105.9 kg)    SpO2 95%    BMI 37.67 kg/m    CC: hosp f/u visit, 6 mo f/u visit Subjective:    Patient ID: Thomas Carlson, male    DOB: 09-29-1946, 73 y.o.   MRN: [id number]  HPI: Thomas Carlson is a 73 y.o. male presenting on 09/03/2019 for Hospitalization Follow-up and Follow-up (Also here for 6 mo f/u.)   Recent hospitalization for acute flare of known lymphocytic colitis associated with dehydration and hypokalemia. C diff testing was negative. Discharged home on imodium and bentyl. Budesonide was recently discontinued due to lack of effect, has been restarted since discharge home. Budesonide remains very expensive. Was advised to stop artificial sweeteners.   Upcoming GI appt 09/13/2019 ([facility]). Started on bentyl and imodium. Neither have really helped.   He continues potassium BID and magnesium daily.   Continued watery diarrhea about 5-6 times a day. No abd pain.   DM - does regularly check sugars every other day, 140s. Compliant with antihyperglycemic regimen which includes: metformin 500mg  tid. Denies low sugars or hypoglycemic symptoms. Denies paresthesias. Last diabetic eye exam 08/2018. Pneumovax: 2013. Prevnar: 2017. Glucometer brand: accu-chek. DSME: has not completed - agrees to referral. Lab Results  Component Value Date   HGBA1C 8.6 (H) 08/24/2019   Diabetic Foot Exam - Simple   No data filed     Lab Results  Component Value Date   MICROALBUR 3.3 (H) 03/02/2019     DATE OF ADMISSION:  08/24/2019   DATE OF DISCHARGE: 08/25/2019 TCM hospital f/u visit completed 08/26/2019  Discharge diagnosis: *Chronic diarrhea secondary to lymphocytic colitis *Hypokalemia *Hypertension *Diabetes mellitus *Hypovolemic hyponatremia     Relevant past medical, surgical,  family and social history reviewed and updated as indicated. Interim medical history since our last visit reviewed. Allergies and medications reviewed and updated. Outpatient Medications Prior to Visit  Medication Sig Dispense Refill   acetaminophen (TYLENOL) 500 MG tablet Take 1,000 mg by mouth every 4 (four) hours as needed for fever or pain.      acyclovir (ZOVIRAX) 400 MG tablet Take 1 tablet (400 mg total) by mouth daily. 90 tablet 3   allopurinol (ZYLOPRIM) 100 MG tablet Take 1 tablet (100 mg total) by mouth daily. 90 tablet 3   aspirin EC 81 MG tablet Take 1 tablet (81 mg total) by mouth daily.     atenolol-chlorthalidone (TENORETIC) 50-25 MG tablet Take 1 tablet by mouth daily. 90 tablet 3   b complex vitamins tablet Take 1 tablet by mouth daily.     Biotin 1000 MCG tablet Take 1,000 mcg by mouth daily.      budesonide (ENTOCORT EC) 3 MG 24 hr capsule Take 3 capsules (9 mg total) by mouth 2 (two) times daily before a meal. 360 capsule 0   cholecalciferol (VITAMIN D3) 25 MCG (1000 UT) tablet Take 1,000 Units by mouth daily.      loperamide (IMODIUM A-D) 2 MG tablet Take 1 tablet (2 mg total) by mouth 4 (four) times daily as needed for diarrhea or loose stools. 30 tablet 0   lovastatin (MEVACOR) 40 MG tablet Take 2 tablets (80 mg total) by mouth at bedtime. 60 tablet 0  Magnesium 500 MG TABS Take 500 mg by mouth daily.     metFORMIN (GLUCOPHAGE) 500 MG tablet TAKE 1 TABLET THREE TIMES DAILY BEFORE MEALS (Patient taking differently: Take 500 mg by mouth 3 (three) times daily before meals. ) 270 tablet 1   Multiple Vitamin (MULTIVITAMIN) tablet Take 1 tablet by mouth daily.     potassium chloride SA (K-DUR) 20 MEQ tablet Take 1 tablet (20 mEq total) by mouth 2 (two) times daily. 180 tablet 3   vitamin C (ASCORBIC ACID) 500 MG tablet Take 500 mg by mouth daily.     dicyclomine (BENTYL) 10 MG capsule Take 1 capsule (10 mg total) by mouth 4 (four) times daily -  before meals  and at bedtime. 120 capsule 0   No facility-administered medications prior to visit.      Per HPI unless specifically indicated in ROS section below Review of Systems Objective:    BP 140/62 (BP Location: Left Arm, Patient Position: Sitting, Cuff Size: Large)    Pulse 65    Temp 97.7 F (36.5 C) (Temporal)    Ht 5\' 6"  (1.676 m)    Wt 233 lb 6 oz (105.9 kg)    SpO2 95%    BMI 37.67 kg/m   Wt Readings from Last 3 Encounters:  09/03/19 233 lb 6 oz (105.9 kg)  08/24/19 234 lb 1.6 oz (106.2 kg)  08/18/19 236 lb 9.6 oz (107.3 kg)    Physical Exam Vitals signs and nursing note reviewed.  Constitutional:      General: He is not in acute distress.    Appearance: He is well-developed.  HENT:     Nose: Nose normal.     Mouth/Throat:     Mouth: Mucous membranes are moist.     Pharynx: Oropharynx is clear. No posterior oropharyngeal erythema.  Eyes:     General: No scleral icterus.    Extraocular Movements: Extraocular movements intact.     Conjunctiva/sclera: Conjunctivae normal.     Pupils: Pupils are equal, round, and reactive to light.  Neck:     Musculoskeletal: Normal range of motion and neck supple.  Cardiovascular:     Rate and Rhythm: Normal rate and regular rhythm.     Pulses: Normal pulses.     Heart sounds: Murmur (3/6 systolic at apex) present.  Pulmonary:     Effort: Pulmonary effort is normal. No respiratory distress.     Breath sounds: Normal breath sounds. No wheezing, rhonchi or rales.  Musculoskeletal:     Right lower leg: No edema.     Left lower leg: No edema.     Comments: See HPI for foot exam if done  Lymphadenopathy:     Cervical: No cervical adenopathy.  Skin:    General: Skin is warm and dry.  Psychiatric:        Mood and Affect: Mood normal.        Behavior: Behavior normal.       Lab Results  Component Value Date   HGBA1C 8.6 (H) 08/24/2019    Assessment & Plan:   Problem List Items Addressed This Visit    Lymphocytic colitis     Appreciate GI care. Difficult situation given poor response to treatment to date. Has f/u planned with GI later this month. ?metformin contribution - will try to taper down on dose to see effect.  He has eliminated artificial sweeteners from diet.       Hypocalcemia    Anticipate related to hypoalbuminemia and  chronic diarrhea. Check PTH.       Relevant Orders   Comprehensive metabolic panel   Parathyroid hormone, intact (no Ca)   Phosphorus   Magnesium   Diabetes mellitus type 2, controlled, with complications (HCC) - Primary    Chronic, deteriorated. Will refer to DSME.  Discussed weekly GLP1 RA - will price out ozempic and if affordable, decrease metformin (to see effect on chronic diarrhea). No fmhx thyroid cancer.       Relevant Medications   Semaglutide,0.25 or 0.5MG /DOS, (OZEMPIC, 0.25 OR 0.5 MG/DOSE,) 2 MG/1.5ML SOPN   Other Relevant Orders   Ambulatory referral to diabetic education       Meds ordered this encounter  Medications   Semaglutide,0.25 or 0.5MG /DOS, (OZEMPIC, 0.25 OR 0.5 MG/DOSE,) 2 MG/1.5ML SOPN    Sig: Inject 0.25 mg into the skin once a week for 14 days, THEN 0.5 mg once a week.    Dispense:  1 pen    Refill:  3   Orders Placed This Encounter  Procedures   Comprehensive metabolic panel   Parathyroid hormone, intact (no Ca)   Phosphorus   Magnesium   Ambulatory referral to diabetic education    Referral Priority:   Routine    Referral Type:   Consultation    Referral Reason:   Specialty Services Required    Number of Visits Requested:   1    Patient Instructions  Labs today Price out ozempic once weekly.  Start at 0.25mg  weekly shot, after 2 weeks increase to 0.5mg  weekly shot.  If you start taking it, decrease metformin to twice daily.  Will await GI f/u visit.  Return in 3 months for follow up visit.    Follow up plan: Return in about 3 months (around 12/04/2019) for follow up visit.  Ria Bush, MD

## 2019-09-03 NOTE — Patient Instructions (Addendum)
Labs today Price out ozempic once weekly.  Start at 0.25mg  weekly shot, after 2 weeks increase to 0.5mg  weekly shot.  If you start taking it, decrease metformin to twice daily.  Will await GI f/u visit.  Return in 3 months for follow up visit.

## 2019-09-06 LAB — PARATHYROID HORMONE, INTACT (NO CA): PTH: 37 pg/mL (ref 14–64)

## 2019-09-08 ENCOUNTER — Telehealth: Payer: Self-pay

## 2019-09-08 NOTE — Telephone Encounter (Signed)
Ok to cancel appt this week. Recommend continue metformin twice daily (as worried increasing may worsen diarrhea), still recommend try ozempic as we may ultimately want to try him off metformin to see effect on chronic diarrhea as well.

## 2019-09-08 NOTE — Telephone Encounter (Signed)
Pt called back and notified as instructed and pt voiced understanding. Pt is going to ck on affordability of ozempic and pt will let Dr Darnell Level know if he can start Ozempic. Pt is going to take metformin twice daily also. 09/10/19 appt cancelled as instructed.

## 2019-09-08 NOTE — Telephone Encounter (Signed)
Pt left v/m and I am unable to reach pt on phone; left v/m for pt to cb. Pt said he got notification he has appt to see Dr Darnell Level on 09/10/19 for 6 mth FU which pt had Hospital FU and 6 mth FU on 09/03/19. Pt wants to know why he has appt on 09/10/19. Per appt chart pt had virtual visit on 03/08/19 and Dr Darnell Level scheduled 6 mth FU at that time for pt.  Pt said he misunderstood instructions for taking metformin. Pt said he has been taking metformin twice a day. Pt has just recently started taking metformin three times daily. Pt wants to know if this will change dx and the prescription for ozempic. Pt request cb.

## 2019-09-08 NOTE — Telephone Encounter (Signed)
Noted  

## 2019-09-10 ENCOUNTER — Ambulatory Visit: Payer: Medicare HMO | Admitting: Family Medicine

## 2019-09-13 ENCOUNTER — Ambulatory Visit: Payer: Medicare HMO | Admitting: Gastroenterology

## 2019-09-13 ENCOUNTER — Encounter: Payer: Self-pay | Admitting: Gastroenterology

## 2019-09-13 ENCOUNTER — Other Ambulatory Visit: Payer: Self-pay

## 2019-09-13 VITALS — BP 137/85 | HR 90 | Temp 98.7°F | Resp 17 | Ht 66.0 in | Wt 227.8 lb

## 2019-09-13 DIAGNOSIS — K52832 Lymphocytic colitis: Secondary | ICD-10-CM

## 2019-09-13 MED ORDER — DIPHENOXYLATE-ATROPINE 2.5-0.025 MG PO TABS: 2.0000 | ORAL_TABLET | Freq: Four times a day (QID) | ORAL | 0 refills | Status: AC | PRN

## 2019-09-13 NOTE — Progress Notes (Signed)
[name], MD [address]  [place], [place] [address]  Main: [phone]  Fax: [phone]    Gastroenterology Consultation  Referring Provider:     Ria Bush, MD Primary Care Physician:  Ria Bush, MD Primary Gastroenterologist:  Dr. [name] Reason for Consultation: Lymphocytic colitis        HPI:   Thomas Carlson is a 73 y.o. male referred by Dr. Ria Bush, MD  for consultation & management of symptomatic internal hemorrhoids.  Patient has history of stage III CKD, coronary artery disease who was admitted to [facility] on 08/24/1989 secondary to rectal bleeding, symptomatic anemia.  He was experiencing rectal bleeding for 2 weeks prior to presentation to the hospital.  Patient has history of lymphocytic colitis taking budesonide.  Patient reports painless hematochezia, hemoglobin on admission was 8.6, baseline 12.7 in 05/2018.  His potassium was 2.2 with AKI on CKD on admission.  Patient underwent upper endoscopy and colonoscopy.  EGD was unremarkable, colonoscopy revealed lymphocytic colitis again as well as large internal hemorrhoids.  He is here to discuss about hemorrhoid ligation.  Patient reports that his energy levels are significantly better, swelling of legs has significantly improved since discharge.  He is no longer experiencing rectal bleeding.  Follow-up visit 10/07/2018 He underwent first hemorrhoid ligation about 2 weeks ago.  Denies any rectal bleeding.  He continues to have watery diarrhea, 3-4 times daily, denies abdominal pain, nausea or vomiting.  He is taking budesonide 3 mg 3 pills with breakfast for microscopic colitis.  Otherwise, his hemoglobin has significantly improved since hospital discharge.  Follow-up visit 04/22/2019 He reports doing fairly well, underwent knee replacement, currently using cane instead of walker.  He has recuperated well.  He reports having 1-2 soft bowel movements daily.  He is done out  of budesonide.  He denies rectal bleeding.  He is also here to discuss about hemorrhoid ligation.  He denies any other GI complaints  Follow-up visit 08/18/2019 Patient is concerned about recurrence of diarrhea that has been ongoing for about 2 to 3 months.  He reports having approximately 8 episodes of nonbloody watery bowel movements during the day and 2 to 3 at night.  This has significantly impacted his quality of life, he had few episodes of incontinence as well.  He denies abdominal pain, bloating, nausea or vomiting, weight loss.  He acknowledges drinking 2-3 diet sodas daily.  He thinks budesonide is no longer working and is expensive.  He denies rectal bleeding  Follow-up visit 09/13/2019 Patient was admitted to [facility] due to worsening of diarrhea.  Stool studies were negative for infection including C. difficile.  Budesonide was restarted and he was discharged home on Bentyl and Imodium.  He continues to have 6-8 episodes of diarrhea during the day and 2-3 times at night.  He finished a course of Bentyl 4 times daily and did not seem to help.  He is waiting to refill budesonide and he thinks budesonide is not helping anymore.  He has been taking magnesium 500 mg daily for several months.  His PCP decrease Metformin to 500 mg twice daily as a potential trigger for worsening of diarrhea.  He is also on allopurinol. His weight has been stable.  He is drinking only water, eliminated carbonated beverages.  NSAIDs: None  Antiplts/Anticoagulants/Anti thrombotics: None  GI Procedures: Colonoscopy by Dr. Allen Norris 10/2015 - The examined portion of the ileum was normal. Biopsied. - Diverticulosis in the sigmoid colon. -  Non-bleeding internal hemorrhoids. - Random biopsies were obtained in the entire colon. Diagnosis 1. Colon, biopsy, terminal ileum - BENIGN SMALL BOWEL MUCOSA. NO VILLOUS ATROPHY, INFLAMMATION OR OTHER ABNORMALITIES PRESENT. 2. Colon, biopsy, random colon - BENIGN COLONIC MUCOSA WITH  FEATURES CONSISTENT WITH MICROSCOPIC COLITIS, SEE COMMENT.  EGD 08/26/2018 - Duodenitis. - Normal second portion of the duodenum. - Non-bleeding erosive gastropathy. Biopsied. - Normal cardia, gastric fundus, gastric body and incisura. Biopsied. - Esophagogastric landmarks identified. - Normal gastroesophageal junction and esophagus.  Colonoscopy 08/26/2018 - Hemorrhoids found on perianal exam. - The examined portion of the ileum was normal. - Two 6 to 8 mm polyps in the transverse colon, removed with a hot snare. Resected and retrieved. - Severe diverticulosis in the sigmoid colon. There was no evidence of diverticular bleeding. - Non-bleeding external and internal hemorrhoids, with stigmata of recent bleeding, likely source of rectal Bleeding.  DIAGNOSIS:  A. STOMACH, RANDOM; COLD BIOPSY:  - ANTRAL MUCOSA WITH MILD REACTIVE GASTRITIS.  - UNREMARKABLE OXYNTIC MUCOSA.  - NEGATIVE FOR H. PYLORI, DYSPLASIA, AND MALIGNANCY.   B. COLON POLYP X2, TRANSVERSE; HOT SNARE:  - TUBULAR ADENOMA (MULTIPLE FRAGMENTS).  - NEGATIVE FOR HIGH-GRADE DYSPLASIA AND MALIGNANCY.   Past Medical History:  Diagnosis Date  . Acute posthemorrhagic anemia   . Aortic valve sclerosis 08/13/2016   Sclerosis without stenosis by [place] (08/2016)  . CAD (coronary artery disease) 2005   s/p stent ([place])  . Cholelithiasis    by CT  . Chronic kidney disease, stage 3, mod decreased GFR   . Diabetes mellitus ([place]) 2010  . Diverticulosis    by CT  . DJD (degenerative joint disease)    knee  . Genital herpes   . Gout   . Heart murmur    followed by PCP  . History of arterial ischemic stroke 03/08/2019   Remote anterior frontal and central MCA stroke by MRI 2020 [facility])  . HLD (hyperlipidemia)   . HTN (hypertension)   . Knee pain, right    "needs replacement"  . Neuropathy    feet  . Obesity   . Seizures ([place])    x1 - after craniotomy (1960s)  . Weakness of both legs    "since back surgery"    Past  Surgical History:  Procedure Laterality Date  . APPENDECTOMY  1958  . CLOSED REDUCTION CLAVICLE FRACTURE  1955  . COLONOSCOPY  02/2008   int hemorrhoids, diverticula (Dr. Allen Norris)  . COLONOSCOPY N/A 08/26/2018   TA, rpt 3 yrs ([place], [place])  . COLONOSCOPY WITH PROPOFOL N/A 11/10/2015   diverticulosis, path with microscopic colitis [name], MD)  . CORONARY ANGIOPLASTY WITH STENT PLACEMENT  2005   stent 2005  . DECOMPRESSIVE LUMBAR LAMINECTOMY LEVEL 1  03/2018   herniated L4/5 disc R with free fargment over L4 s/p surgery L4 ([place])  . ESOPHAGOGASTRODUODENOSCOPY N/A 08/26/2018   reactive gastritis with benign biopsies ([place], [place]ini Reddy)  . HERNIA REPAIR  1956   inguinal  . KNEE ARTHROSCOPY  2008   torn meniscus  . SUBDURAL HEMATOMA EVACUATION VIA CRANIOTOMY  1964   hit in helmet by baseball  . TOTAL KNEE ARTHROPLASTY Right 11/12/2018   Procedure: TOTAL KNEE ARTHROPLASTY;  Surgeon: Thornton Park, MD;  Location: [facility] ORS;  Service: Orthopedics;  Laterality: Right;    Current Outpatient Medications:  .  acetaminophen (TYLENOL) 500 MG tablet, Take 1,000 mg by mouth every 4 (four) hours as needed for fever or pain. , Disp: , Rfl:  .  acyclovir (ZOVIRAX) 400 MG tablet, Take 1 tablet (400 mg total) by mouth daily., Disp: 90 tablet, Rfl: 3 .  allopurinol (ZYLOPRIM) 100 MG tablet, Take 1 tablet (100 mg total) by mouth daily., Disp: 90 tablet, Rfl: 3 .  aspirin EC 81 MG tablet, Take 1 tablet (81 mg total) by mouth daily., Disp: , Rfl:  .  atenolol-chlorthalidone (TENORETIC) 50-25 MG tablet, Take 1 tablet by mouth daily., Disp: 90 tablet, Rfl: 3 .  b complex vitamins tablet, Take 1 tablet by mouth daily., Disp: , Rfl:  .  Biotin 1000 MCG tablet, Take 1,000 mcg by mouth daily. , Disp: , Rfl:  .  budesonide (ENTOCORT EC) 3 MG 24 hr capsule, Take 3 capsules (9 mg total) by mouth 2 (two) times daily before a meal., Disp: 360 capsule, Rfl: 0 .  cholecalciferol (VITAMIN D3) 25 MCG  (1000 UT) tablet, Take 1,000 Units by mouth daily. , Disp: , Rfl:  .  loperamide (IMODIUM A-D) 2 MG tablet, Take 1 tablet (2 mg total) by mouth 4 (four) times daily as needed for diarrhea or loose stools., Disp: 30 tablet, Rfl: 0 .  lovastatin (MEVACOR) 40 MG tablet, Take 2 tablets (80 mg total) by mouth at bedtime., Disp: 60 tablet, Rfl: 0 .  Magnesium 500 MG TABS, Take 500 mg by mouth daily., Disp: , Rfl:  .  metFORMIN (GLUCOPHAGE) 500 MG tablet, TAKE 1 TABLET THREE TIMES DAILY BEFORE MEALS (Patient taking differently: Take 500 mg by mouth 3 (three) times daily before meals. ), Disp: 270 tablet, Rfl: 1 .  Multiple Vitamin (MULTIVITAMIN) tablet, Take 1 tablet by mouth daily., Disp: , Rfl:  .  potassium chloride SA (K-DUR) 20 MEQ tablet, Take 1 tablet (20 mEq total) by mouth 2 (two) times daily., Disp: 180 tablet, Rfl: 3 .  Semaglutide,0.25 or 0.5MG /DOS, (OZEMPIC, 0.25 OR 0.5 MG/DOSE,) 2 MG/1.5ML SOPN, Inject 0.25 mg into the skin once a week for 14 days, THEN 0.5 mg once a week., Disp: 1 pen, Rfl: 3 .  vitamin C (ASCORBIC ACID) 500 MG tablet, Take 500 mg by mouth daily., Disp: , Rfl:  .  diphenoxylate-atropine (LOMOTIL) 2.5-0.025 MG tablet, Take 2 tablets by mouth 4 (four) times daily as needed for diarrhea or loose stools., Disp: 120 tablet, Rfl: 0   Family History  Problem Relation Age of Onset  . Diabetes Father 12  . Coronary artery disease Sister        catheterizations  . COPD Brother   . Stroke Brother   . Hypertension Brother   . Cancer Neg Hx      Social History   Tobacco Use  . Smoking status: Never Smoker  . Smokeless tobacco: Never Used  Substance Use Topics  . Alcohol use: No    Alcohol/week: 0.0 standard drinks  . Drug use: No    Allergies as of 09/13/2019 - Review Complete 09/13/2019  Allergen Reaction Noted  . Gabapentin Other (See Comments) 08/19/2018  . Bactrim [sulfamethoxazole-trimethoprim] Rash 05/19/2013  . Penicillins Hives 06/18/2012  . Lasix  [furosemide] Other (See Comments) 09/02/2018    Review of Systems:    All systems reviewed and negative except where noted in HPI.   Physical Exam:  BP 137/85 (BP Location: Left Arm, Patient Position: Sitting, Cuff Size: Large)   Pulse 90   Temp 98.7 F (37.1 C)   Resp 17   Ht 5\' 6"  (1.676 m)   Wt 227 lb 12.8 oz (103.3 kg)   BMI 36.77 kg/m  No LMP for male patient.  General:   Alert,  Well-developed, well-nourished, pleasant and cooperative in NAD Head:  Normocephalic and atraumatic. Eyes:  Sclera clear, no icterus.   Conjunctiva pink. Ears:  Normal auditory acuity. Nose:  No deformity, discharge, or lesions. Mouth:  No deformity or lesions,oropharynx pink & moist. Neck:  Supple; no masses or thyromegaly. Lungs:  Respirations even and unlabored.  Clear throughout to auscultation.   No wheezes, crackles, or rhonchi. No acute distress. Heart:  Regular rate and rhythm; no murmurs, clicks, rubs, or gallops. Abdomen:  Normal bowel sounds. Soft, obese, non-tender and non-distended without masses, hepatosplenomegaly or hernias noted.  No guarding or rebound tenderness.   Rectal: Nontender, large internal hemorrhoids Msk:  Symmetrical without gross deformities. Good, equal movement & strength bilaterally. Pulses:  Normal pulses noted. Extremities:  No clubbing, bilateral swelling of feet.  No cyanosis. Neurologic:  Alert and oriented x3;  grossly normal neurologically. Skin:  Intact without significant lesions or rashes. No jaundice. Psych:  Alert and cooperative. Normal mood and affect.  Imaging Studies: Reviewed  Assessment and Plan:   Thomas Carlson is a 73 y.o. Caucasian male with lymphocytic colitis, chronic kidney disease, coronary artery disease with rectal bleeding secondary to internal hemorrhoids resulting in anemia which is currently resolved, status post hemorrhoid ligation x3.  Patient is seen for follow-up of lymphocytic colitis, diarrhea not well controlled.  Patient  reports budesonide is not helping at all.  Lymphocytic colitis, flareup of diarrhea Hold off on budesonide at this time Stop allopurinol and magnesium We will try Lomotil 2 pills up to 4 times a day Recheck electrolytes in 3 to 4 days. If diarrhea is persistent, will check serum cortisol, perform stool osmolality, stool electrolytes and other work-up of chronic diarrhea  Follow up in 4 to 6 weeks   [name], MD

## 2019-09-16 DIAGNOSIS — K52832 Lymphocytic colitis: Secondary | ICD-10-CM | POA: Diagnosis not present

## 2019-09-17 ENCOUNTER — Other Ambulatory Visit: Payer: Self-pay

## 2019-09-17 ENCOUNTER — Other Ambulatory Visit: Payer: Self-pay | Admitting: Gastroenterology

## 2019-09-17 DIAGNOSIS — K529 Noninfective gastroenteritis and colitis, unspecified: Secondary | ICD-10-CM

## 2019-09-17 LAB — BASIC METABOLIC PANEL
BUN/Creatinine Ratio: 9 — ABNORMAL LOW (ref 10–24)
BUN: 13 mg/dL (ref 8–27)
CO2: 23 mmol/L (ref 20–29)
Calcium: 9.7 mg/dL (ref 8.6–10.2)
Chloride: 102 mmol/L (ref 96–106)
Creatinine, Ser: 1.44 mg/dL — ABNORMAL HIGH (ref 0.76–1.27)
GFR calc Af Amer: 55 mL/min/{1.73_m2} — ABNORMAL LOW (ref >59)
GFR calc non Af Amer: 47 mL/min/{1.73_m2} — ABNORMAL LOW (ref >59)
Glucose: 135 mg/dL — ABNORMAL HIGH (ref 65–99)
Potassium: 4.1 mmol/L (ref 3.5–5.2)
Sodium: 145 mmol/L — ABNORMAL HIGH (ref 134–144)

## 2019-09-17 LAB — MAGNESIUM: Magnesium: 1.5 mg/dL — ABNORMAL LOW (ref 1.6–2.3)

## 2019-09-27 DIAGNOSIS — K52832 Lymphocytic colitis: Secondary | ICD-10-CM | POA: Diagnosis not present

## 2019-09-27 DIAGNOSIS — K529 Noninfective gastroenteritis and colitis, unspecified: Secondary | ICD-10-CM | POA: Diagnosis not present

## 2019-09-28 LAB — BASIC METABOLIC PANEL
BUN/Creatinine Ratio: 11 (ref 10–24)
BUN: 15 mg/dL (ref 8–27)
CO2: 23 mmol/L (ref 20–29)
Calcium: 9 mg/dL (ref 8.6–10.2)
Chloride: 100 mmol/L (ref 96–106)
Creatinine, Ser: 1.31 mg/dL — ABNORMAL HIGH (ref 0.76–1.27)
GFR calc Af Amer: 62 mL/min/{1.73_m2} (ref >59)
GFR calc non Af Amer: 53 mL/min/{1.73_m2} — ABNORMAL LOW (ref >59)
Glucose: 105 mg/dL — ABNORMAL HIGH (ref 65–99)
Potassium: 3.3 mmol/L — ABNORMAL LOW (ref 3.5–5.2)
Sodium: 141 mmol/L (ref 134–144)

## 2019-09-28 LAB — PHOSPHORUS: Phosphorus: 2.8 mg/dL (ref 2.8–4.1)

## 2019-09-28 LAB — MAGNESIUM: Magnesium: 1.5 mg/dL — ABNORMAL LOW (ref 1.6–2.3)

## 2019-09-28 LAB — OSMOLALITY: Osmolality Meas: 286 mOsmol/kg (ref 280–301)

## 2019-09-28 LAB — CORTISOL: Cortisol: 10.5 ug/dL

## 2019-09-29 ENCOUNTER — Telehealth: Payer: Self-pay

## 2019-09-29 DIAGNOSIS — K529 Noninfective gastroenteritis and colitis, unspecified: Secondary | ICD-10-CM | POA: Diagnosis not present

## 2019-09-29 NOTE — Telephone Encounter (Signed)
-----   Message from Lin Landsman, MD sent at 09/29/2019  4:29 PM EST ----- Inform patient that his magnesium and potassium are mildly low.  He should continue taking potassium.  Please check with him if he dropped of the stool specimen for stool studies  Thanks RV

## 2019-09-29 NOTE — Telephone Encounter (Signed)
Called and left a message for call back  

## 2019-09-30 NOTE — Telephone Encounter (Signed)
Patient verbalized understanding and he turned in stool sample yesterday

## 2019-10-04 ENCOUNTER — Telehealth: Payer: Self-pay | Admitting: *Deleted

## 2019-10-04 NOTE — Telephone Encounter (Signed)
Patient called stating that he has a loss of taste off and on for about 3 weeks. Patient stated that he is losing weight fast and is concerned. Patient stated that he is not able to eat much even though he is hungry. Patient stated that he is seeing GI because he has had diarrhea for quite a while. Patient stated that he is suppose to hear something back from GI today about the stool test that he did. Patient wants to know what Dr, Danise Mina thinks he should do.

## 2019-10-05 NOTE — Telephone Encounter (Signed)
Spoke with pt relaying Dr. Synthia Innocent message and asking questions.  Pt states today he weighs 210 lbs.  He was scheduled to see nutritionist yesterday but had to c/x due to diarrhea.  Pt has not been checking BS.  He did start Ozempic 09/27/19 but is asking if he is to continue metformin.   Also, pt received a jury summons to possibly serve on 11/24/19.  He asking for a doctor's note to excuse him.

## 2019-10-05 NOTE — Telephone Encounter (Signed)
Stool test will take some time to run.  At [facility] last month he weighed 233 lbs, with GI he weighed 227 lbs. How much does he weigh today?  Is he planning to see nutritionist as well?  Looks like GI wanted to see him after stool studies. If unrevealing and ongoing weight loss, recommend office visit with me after he sees GI.  How are sugars running? Did he price out/start ozempic?

## 2019-10-06 ENCOUNTER — Telehealth: Payer: Self-pay | Admitting: Gastroenterology

## 2019-10-06 ENCOUNTER — Telehealth: Payer: Self-pay

## 2019-10-06 NOTE — Telephone Encounter (Signed)
Pt is calling to obtain stool sample results

## 2019-10-06 NOTE — Telephone Encounter (Signed)
Telephone note opened in error.  Thanks,  Sharyn Lull

## 2019-10-06 NOTE — Telephone Encounter (Signed)
Stool results are not back yet.  I also ordered stool magnesium and phosphorus which are not processed.    Thomas Carlson, can you call the lab and find out? If patient is still having diarrhea, let us try cholestyramine 1 packet 3 times a day while waiting for the results  Thanks RV

## 2019-10-07 ENCOUNTER — Ambulatory Visit: Payer: Medicare HMO | Admitting: Skilled Nursing Facility1

## 2019-10-07 ENCOUNTER — Telehealth: Payer: Self-pay

## 2019-10-07 DIAGNOSIS — I10 Essential (primary) hypertension: Secondary | ICD-10-CM | POA: Diagnosis not present

## 2019-10-07 DIAGNOSIS — R000 Tachycardia, unspecified: Secondary | ICD-10-CM | POA: Diagnosis not present

## 2019-10-07 DIAGNOSIS — K529 Noninfective gastroenteritis and colitis, unspecified: Secondary | ICD-10-CM | POA: Diagnosis not present

## 2019-10-07 DIAGNOSIS — R531 Weakness: Secondary | ICD-10-CM | POA: Diagnosis not present

## 2019-10-07 DIAGNOSIS — Z20828 Contact with and (suspected) exposure to other viral communicable diseases: Secondary | ICD-10-CM | POA: Diagnosis not present

## 2019-10-07 DIAGNOSIS — R112 Nausea with vomiting, unspecified: Secondary | ICD-10-CM | POA: Diagnosis not present

## 2019-10-07 DIAGNOSIS — E119 Type 2 diabetes mellitus without complications: Secondary | ICD-10-CM | POA: Diagnosis not present

## 2019-10-07 LAB — POTASSIUM, STOOL: Potassium, Stl: 73 mmol/L

## 2019-10-07 LAB — OSMOLALITY, STOOL: Osmolality,Stl: 322 mOsmol/kg

## 2019-10-07 LAB — SODIUM, STOOL: Sodium, Stl: 31 mmol/L

## 2019-10-07 MED ORDER — CHOLESTYRAMINE 4 G PO PACK: 4.0000 g | PACK | Freq: Three times a day (TID) | ORAL | 0 refills | Status: DC

## 2019-10-07 NOTE — Telephone Encounter (Signed)
Spoke with pt relaying Dr. Synthia Innocent message.  Pt states because he has been eating chicken noodle soup and bread/crackers, he has not been checking BS.  Pt was on his way out the door.  Says he's not doing well and his wife is taking him to [facility].  C/o hunger pains, dry heaves, diarrhea, severe fatigue dizziness.  Pt will have his wife call back tomorrow with an update and the amount of B12 in the B complex.  Fyi to Dr. Darnell Level.   Also, notified pt letter for jury duty is ready to pick up.  Verbalizes understanding.  [Placed letter at front office.]

## 2019-10-07 NOTE — Telephone Encounter (Signed)
Called patient, didn't answer. Called his wife. She said she took him to [facility]. She doesn't know if he is going to get admitted. I couldn't access [facility] on care everywhere (asking for pt's ID)  According to her, his diarrhea worsened since thanksgiving. Unable to eat solids, feels nauseous and lost weight. He started Ozempic on 11/30. Reviewing this medication, it has common GI side effects 30-40% including nausea, LOA, diarrhea, distension, delayed gastric emptying. Suspect if this is contributing to his symptoms on top of underlying lymphocytic colitis  Will touch base with patient's wife tomorrow  [name], MD [address]  [place]  [place], [place] [address]  Main: [phone]  Fax: [phone] Pager: [phone]

## 2019-10-07 NOTE — Telephone Encounter (Addendum)
Decrease metformin to twice daily now that he's on ozempic. How are sugars running? 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 Recommend office visit with me if ongoing weight loss.  How much b12 is in his b complex vitamin and is he taking daily? May need b12 shot to treat low b12 - this could be cause of loss of taste.  [Consider zinc/b12 levels, sjogren's]

## 2019-10-07 NOTE — Telephone Encounter (Addendum)
Patient states he is still having the diarrhea. Informed patient we would call in a medication to help with this. Patient states he is losing weight because he can not taste anything and is on a clear liquid diet. Patient states he was taken off the gout medication but he had a gout attack 2 days after stopping the medication and he restarted the medication. Patient states that he is taking the magnesium also. Patient states he also got called to jury duty and needs a letter stating he needs to be excused from this. Informed patient that this needed to come from his PCP.

## 2019-10-07 NOTE — Telephone Encounter (Signed)
Patient wife is calling because she states patient is not eating and will barely drank fluids. She states he will not get off the cough. He is so fatigue. She states he states that he can not taste anything to eat and does not want anything. She states patient is diabetic and the only thing patient is drinking is some ensure and Gatorade. She states that she is watching her husband die. Advised patient that she needed to take him to the ER if he was not drinking a lot of fluids because he is dehydrated. She states he will not go but she will try to get him to go. She wanted Dr. Marius Ditch to call patient personally to discuss his care with him. Advised patient wife that she was out of the office today and did not know when she would get this message.

## 2019-10-07 NOTE — Telephone Encounter (Signed)
Patient wife called back and states he will  go to the urgent care

## 2019-10-07 NOTE — Telephone Encounter (Signed)
For the Magnesium fecal it did not get done. [facility] is going to add on this test to his stool today and they will give [place] the report when it is back.

## 2019-10-07 NOTE — Addendum Note (Signed)
Addended by: Ulyess Blossom L on: 10/07/2019 08:18 AM   Modules accepted: Orders

## 2019-10-08 NOTE — Telephone Encounter (Signed)
Spoke with pt asking for an update.  States he Seen at [facility].  Pt was given IV fluids and prescribed med for abd pain.  He/wife, [name], will call back with the name of the med and the amount of vit B12 in B complex pt is taking. Fyi to Dr. Darnell Level.   I relayed Dr. Synthia Innocent message.  Pt verbalizes understanding.

## 2019-10-08 NOTE — Telephone Encounter (Signed)
plz call today for update.  Received note from GI - agree ozempic may contribute to GI symptoms - rec stop right away and notify us in a week if not improving.

## 2019-10-12 NOTE — Telephone Encounter (Addendum)
Plz call for update on symptoms off ozempic.  If doing better, recommend stay off ozempic at this time but continue to monitor sugars and let us know how they're running in 2-3 wks.

## 2019-10-13 ENCOUNTER — Telehealth: Payer: Self-pay | Admitting: *Deleted

## 2019-10-13 NOTE — Telephone Encounter (Signed)
Spoke with pt to get an update.  States the diarrhea is not any better being off Ozempic.  Says GI prescribed a med that also is not helping but pt could not think of name.  He needed to end the call due to expected company for some home repairs.  Fyi to Dr. Darnell Level.

## 2019-10-13 NOTE — Telephone Encounter (Signed)
Patient called stating that he was told  to call and give information on the new medications that he is taking. Patient stated that he is now taking Cholestyramine packets three times a day and Ondansetron 4 mg, every 8 hours as needed for nausea. Patient stated that the information on his Vitamin B bottle shows Vitamin B-5 pyridoxine 2 mg and Vitamin B-12 15 mcg and he takes this daily.

## 2019-10-15 MED ORDER — B-12 1000 MCG SL SUBL: 1.0000 | SUBLINGUAL_TABLET | Freq: Every day | SUBLINGUAL | Status: DC

## 2019-10-15 NOTE — Telephone Encounter (Signed)
Noted. Recommend he change from b complex vitamin to b12 1033mcg daily, dissolvable tablet if able to find.

## 2019-10-15 NOTE — Telephone Encounter (Signed)
Pt notified as instructed, by phone.  Verbalizes understanding.

## 2019-10-26 NOTE — Progress Notes (Signed)
Based on his stool studies, he does not have secretory diarrhea. Stool osmolar gap is 114.00 which is considered as indeterminate (SOG >50 and <125)  Check with him if he is still having diarrhea and weight loss, if yes, offer him a virtual/tele visit tomorrow or next week

## 2019-10-27 ENCOUNTER — Telehealth: Payer: Self-pay

## 2019-10-27 ENCOUNTER — Telehealth: Payer: Self-pay | Admitting: Gastroenterology

## 2019-10-27 NOTE — Telephone Encounter (Signed)
Patient called & l/m on v/m stating he received call from [facility] about his test results.

## 2019-10-27 NOTE — Telephone Encounter (Signed)
Pt has been notified of results and verbalized understanding, pt states he is still experiencing a little diarrhea but no more weight loss, wants to wait a while before he schedules appt.

## 2019-10-28 DIAGNOSIS — H5203 Hypermetropia, bilateral: Secondary | ICD-10-CM | POA: Diagnosis not present

## 2019-11-15 ENCOUNTER — Other Ambulatory Visit: Payer: Self-pay | Admitting: Family Medicine

## 2019-11-17 ENCOUNTER — Other Ambulatory Visit: Payer: Self-pay

## 2019-11-17 ENCOUNTER — Ambulatory Visit: Payer: Medicare HMO | Attending: Internal Medicine

## 2019-11-17 DIAGNOSIS — Z23 Encounter for immunization: Secondary | ICD-10-CM | POA: Insufficient documentation

## 2019-11-17 NOTE — Progress Notes (Signed)
   Covid-19 Vaccination Clinic  Name:  Thomas Carlson    MRN: [id number] DOB: [birth year]  11/17/2019  Thomas Carlson was observed post Covid-19 immunization for 15 minutes without incidence. He was provided with Vaccine Information Sheet and instruction to access the V-Safe system.   Thomas Carlson was instructed to call 911 with any severe reactions post vaccine: Marland Kitchen Difficulty breathing  . Swelling of your face and throat  . A fast heartbeat  . A bad rash all over your body  . Dizziness and weakness    Immunizations Administered    Name Date Dose VIS Date Route   Pfizer COVID-19 Vaccine 11/17/2019 11:38 AM 0.3 mL 10/08/2019 Intramuscular   Manufacturer: [place]   Lot: [id number]   [place]: [id number]

## 2019-12-02 DIAGNOSIS — R432 Parageusia: Secondary | ICD-10-CM | POA: Diagnosis not present

## 2019-12-02 DIAGNOSIS — K529 Noninfective gastroenteritis and colitis, unspecified: Secondary | ICD-10-CM | POA: Diagnosis not present

## 2019-12-02 DIAGNOSIS — K52832 Lymphocytic colitis: Secondary | ICD-10-CM | POA: Diagnosis not present

## 2019-12-07 DIAGNOSIS — K529 Noninfective gastroenteritis and colitis, unspecified: Secondary | ICD-10-CM | POA: Diagnosis not present

## 2019-12-08 ENCOUNTER — Ambulatory Visit: Payer: Medicare HMO | Attending: Internal Medicine

## 2019-12-08 DIAGNOSIS — Z23 Encounter for immunization: Secondary | ICD-10-CM

## 2019-12-08 NOTE — Progress Notes (Signed)
   Covid-19 Vaccination Clinic  Name:  Thomas Carlson    MRN: [id number] DOB: [birth year]  12/08/2019  Thomas Carlson was observed post Covid-19 immunization for 15 minutes without incidence. He was provided with Vaccine Information Sheet and instruction to access the V-Safe system.   Thomas Carlson was instructed to call 911 with any severe reactions post vaccine: Marland Kitchen Difficulty breathing  . Swelling of your face and throat  . A fast heartbeat  . A bad rash all over your body  . Dizziness and weakness    Immunizations Administered    Name Date Dose VIS Date Route   Pfizer COVID-19 Vaccine 12/08/2019  3:44 PM 0.3 mL 10/08/2019 Intramuscular   Manufacturer: [place]   Lot: [id number]   [place]: [id number]

## 2019-12-26 ENCOUNTER — Encounter: Payer: Self-pay | Admitting: Family Medicine

## 2019-12-26 DIAGNOSIS — K529 Noninfective gastroenteritis and colitis, unspecified: Secondary | ICD-10-CM | POA: Insufficient documentation

## 2020-01-13 DIAGNOSIS — K529 Noninfective gastroenteritis and colitis, unspecified: Secondary | ICD-10-CM | POA: Diagnosis not present

## 2020-01-13 DIAGNOSIS — R899 Unspecified abnormal finding in specimens from other organs, systems and tissues: Secondary | ICD-10-CM | POA: Diagnosis not present

## 2020-01-28 ENCOUNTER — Other Ambulatory Visit: Payer: Self-pay | Admitting: Family Medicine

## 2020-02-11 LAB — SPECIMEN STATUS REPORT

## 2020-02-11 LAB — MAGNESIUM, FECAL: Magnesium, Fecal-Per Volume: 21 mg/dL (ref 0–110)

## 2020-02-29 ENCOUNTER — Telehealth: Payer: Self-pay | Admitting: Family Medicine

## 2020-02-29 NOTE — Progress Notes (Signed)
  Chronic Care Management   Outreach Note  02/29/2020 Name: Thomas Carlson MRN: [id number] DOB: March 31, 1946  Referred by: Ria Bush, MD Reason for referral : No chief complaint on file.   An unsuccessful telephone outreach was attempted today. The patient was referred to the pharmacist for assistance with care management and care coordination.    This note is not being shared with the patient for the following reason: To respect privacy (The patient or proxy has requested that the information not be shared).  Follow Up Plan:   Raynicia Dukes [employer] Scheduler

## 2020-02-29 NOTE — Progress Notes (Signed)
°  Chronic Care Management   Note  02/29/2020 Name: Thomas Carlson MRN: [id number] DOB: 24-Dec-1945  Thomas Carlson is a 74 y.o. year old male who is a primary care patient of Ria Bush, MD. I reached out to Thomas Carlson by phone today in response to a referral sent by Mr. Dalas Hartshorne Boshers's PCP, Ria Bush, MD.   Mr. Scurti was given information about Chronic Care Management services today including:  1. CCM service includes personalized support from designated clinical staff supervised by his physician, including individualized plan of care and coordination with other care providers 2. 24/7 contact phone numbers for assistance for urgent and routine care needs. 3. Service will only be billed when office clinical staff spend 20 minutes or more in a month to coordinate care. 4. Only one practitioner may furnish and bill the service in a calendar month. 5. The patient may stop CCM services at any time (effective at the end of the month) by phone call to the office staff.   Patient agreed to services and verbal consent obtained.    This note is not being shared with the patient for the following reason: To respect privacy (The patient or proxy has requested that the information not be shared).  Follow up plan:   Raynicia Dukes [employer] Scheduler

## 2020-03-16 ENCOUNTER — Telehealth: Payer: Self-pay | Admitting: Family Medicine

## 2020-03-16 MED ORDER — ALLOPURINOL 100 MG PO TABS: 100.0000 mg | ORAL_TABLET | Freq: Every day | ORAL | 0 refills | Status: DC

## 2020-03-16 NOTE — Telephone Encounter (Signed)
Patient called requesting a refill  allopurinol (ZYLOPRIM) 100 MG tablet  He stated that 3-4 weeks ago [facility] told him to contact the office because they have not heard back from [place] about refill for this medication   Patient stated he is completely out   [place]

## 2020-03-16 NOTE — Telephone Encounter (Signed)
E-scribed refill.  Spoke with pt notifying him refill was sent.  He expresses his thanks.

## 2020-03-19 ENCOUNTER — Other Ambulatory Visit: Payer: Self-pay | Admitting: Family Medicine

## 2020-03-19 DIAGNOSIS — E1122 Type 2 diabetes mellitus with diabetic chronic kidney disease: Secondary | ICD-10-CM

## 2020-03-19 DIAGNOSIS — E785 Hyperlipidemia, unspecified: Secondary | ICD-10-CM

## 2020-03-19 DIAGNOSIS — M1A29X0 Drug-induced chronic gout, multiple sites, without tophus (tophi): Secondary | ICD-10-CM

## 2020-03-19 DIAGNOSIS — E118 Type 2 diabetes mellitus with unspecified complications: Secondary | ICD-10-CM

## 2020-03-19 DIAGNOSIS — E559 Vitamin D deficiency, unspecified: Secondary | ICD-10-CM

## 2020-03-19 DIAGNOSIS — Z125 Encounter for screening for malignant neoplasm of prostate: Secondary | ICD-10-CM

## 2020-03-19 NOTE — Addendum Note (Signed)
Addended by: Ria Bush on: 03/19/2020 09:04 PM   Modules accepted: Orders

## 2020-03-20 DIAGNOSIS — L821 Other seborrheic keratosis: Secondary | ICD-10-CM | POA: Diagnosis not present

## 2020-03-20 DIAGNOSIS — I8393 Asymptomatic varicose veins of bilateral lower extremities: Secondary | ICD-10-CM | POA: Diagnosis not present

## 2020-03-20 DIAGNOSIS — L84 Corns and callosities: Secondary | ICD-10-CM | POA: Diagnosis not present

## 2020-03-20 DIAGNOSIS — L814 Other melanin hyperpigmentation: Secondary | ICD-10-CM | POA: Diagnosis not present

## 2020-03-20 DIAGNOSIS — L570 Actinic keratosis: Secondary | ICD-10-CM | POA: Diagnosis not present

## 2020-03-20 DIAGNOSIS — L819 Disorder of pigmentation, unspecified: Secondary | ICD-10-CM | POA: Diagnosis not present

## 2020-03-20 DIAGNOSIS — L738 Other specified follicular disorders: Secondary | ICD-10-CM | POA: Diagnosis not present

## 2020-03-20 DIAGNOSIS — D1801 Hemangioma of skin and subcutaneous tissue: Secondary | ICD-10-CM | POA: Diagnosis not present

## 2020-03-20 DIAGNOSIS — D229 Melanocytic nevi, unspecified: Secondary | ICD-10-CM | POA: Diagnosis not present

## 2020-03-21 ENCOUNTER — Other Ambulatory Visit (INDEPENDENT_AMBULATORY_CARE_PROVIDER_SITE_OTHER): Payer: Medicare HMO

## 2020-03-21 ENCOUNTER — Other Ambulatory Visit: Payer: Self-pay

## 2020-03-21 DIAGNOSIS — M1A29X0 Drug-induced chronic gout, multiple sites, without tophus (tophi): Secondary | ICD-10-CM | POA: Diagnosis not present

## 2020-03-21 DIAGNOSIS — E1169 Type 2 diabetes mellitus with other specified complication: Secondary | ICD-10-CM

## 2020-03-21 DIAGNOSIS — E559 Vitamin D deficiency, unspecified: Secondary | ICD-10-CM | POA: Diagnosis not present

## 2020-03-21 DIAGNOSIS — N183 Chronic kidney disease, stage 3 unspecified: Secondary | ICD-10-CM | POA: Diagnosis not present

## 2020-03-21 DIAGNOSIS — E1122 Type 2 diabetes mellitus with diabetic chronic kidney disease: Secondary | ICD-10-CM | POA: Diagnosis not present

## 2020-03-21 DIAGNOSIS — Z125 Encounter for screening for malignant neoplasm of prostate: Secondary | ICD-10-CM | POA: Diagnosis not present

## 2020-03-21 DIAGNOSIS — E118 Type 2 diabetes mellitus with unspecified complications: Secondary | ICD-10-CM | POA: Diagnosis not present

## 2020-03-21 DIAGNOSIS — E785 Hyperlipidemia, unspecified: Secondary | ICD-10-CM

## 2020-03-21 LAB — COMPREHENSIVE METABOLIC PANEL
ALT: 9 U/L (ref 0–53)
AST: 13 U/L (ref 0–37)
Albumin: 4.1 g/dL (ref 3.5–5.2)
Alkaline Phosphatase: 64 U/L (ref 39–117)
BUN: 32 mg/dL — ABNORMAL HIGH (ref 6–23)
CO2: 28 mEq/L (ref 19–32)
Calcium: 9.5 mg/dL (ref 8.4–10.5)
Chloride: 105 mEq/L (ref 96–112)
Creatinine, Ser: 1.51 mg/dL — ABNORMAL HIGH (ref 0.40–1.50)
GFR: 45.29 mL/min — ABNORMAL LOW (ref >60.00)
Glucose, Bld: 106 mg/dL — ABNORMAL HIGH (ref 70–99)
Potassium: 4.2 mEq/L (ref 3.5–5.1)
Sodium: 140 mEq/L (ref 135–145)
Total Bilirubin: 0.6 mg/dL (ref 0.2–1.2)
Total Protein: 6 g/dL (ref 6.0–8.3)

## 2020-03-21 LAB — CBC WITH DIFFERENTIAL/PLATELET
Basophils Absolute: 0.1 10*3/uL (ref 0.0–0.1)
Basophils Relative: 0.9 % (ref 0.0–3.0)
Eosinophils Absolute: 0.2 10*3/uL (ref 0.0–0.7)
Eosinophils Relative: 3.4 % (ref 0.0–5.0)
HCT: 39.3 % (ref 39.0–52.0)
Hemoglobin: 13.3 g/dL (ref 13.0–17.0)
Lymphocytes Relative: 31 % (ref 12.0–46.0)
Lymphs Abs: 1.7 10*3/uL (ref 0.7–4.0)
MCHC: 34 g/dL (ref 30.0–36.0)
MCV: 96 fl (ref 78.0–100.0)
Monocytes Absolute: 0.7 10*3/uL (ref 0.1–1.0)
Monocytes Relative: 12.7 % — ABNORMAL HIGH (ref 3.0–12.0)
Neutro Abs: 2.9 10*3/uL (ref 1.4–7.7)
Neutrophils Relative %: 52 % (ref 43.0–77.0)
Platelets: 242 10*3/uL (ref 150.0–400.0)
RBC: 4.09 Mil/uL — ABNORMAL LOW (ref 4.22–5.81)
RDW: 14.2 % (ref 11.5–15.5)
WBC: 5.6 10*3/uL (ref 4.0–10.5)

## 2020-03-21 LAB — LIPID PANEL
Cholesterol: 143 mg/dL (ref 0–200)
HDL: 36.5 mg/dL — ABNORMAL LOW (ref >39.00)
LDL Cholesterol: 69 mg/dL (ref 0–99)
NonHDL: 106.7
Total CHOL/HDL Ratio: 4
Triglycerides: 187 mg/dL — ABNORMAL HIGH (ref 0.0–149.0)
VLDL: 37.4 mg/dL (ref 0.0–40.0)

## 2020-03-21 LAB — PSA, MEDICARE: PSA: 0.11 ng/ml (ref 0.10–4.00)

## 2020-03-21 LAB — MICROALBUMIN / CREATININE URINE RATIO
Creatinine,U: 121.2 mg/dL
Microalb Creat Ratio: 0.6 mg/g (ref 0.0–30.0)
Microalb, Ur: <0.7 mg/dL (ref 0.0–1.9)

## 2020-03-21 LAB — URIC ACID: Uric Acid, Serum: 8.8 mg/dL — ABNORMAL HIGH (ref 4.0–7.8)

## 2020-03-21 LAB — VITAMIN D 25 HYDROXY (VIT D DEFICIENCY, FRACTURES): VITD: 42.7 ng/mL (ref 30.00–100.00)

## 2020-03-21 LAB — HEMOGLOBIN A1C: Hgb A1c MFr Bld: 5.7 % (ref 4.6–6.5)

## 2020-03-28 ENCOUNTER — Other Ambulatory Visit: Payer: Self-pay

## 2020-03-28 ENCOUNTER — Encounter: Payer: Self-pay | Admitting: Family Medicine

## 2020-03-28 ENCOUNTER — Ambulatory Visit (INDEPENDENT_AMBULATORY_CARE_PROVIDER_SITE_OTHER): Payer: Medicare HMO | Admitting: Family Medicine

## 2020-03-28 VITALS — BP 108/60 | HR 69 | Temp 97.9°F | Ht 68.0 in | Wt 210.2 lb

## 2020-03-28 DIAGNOSIS — Z96651 Presence of right artificial knee joint: Secondary | ICD-10-CM

## 2020-03-28 DIAGNOSIS — Z0000 Encounter for general adult medical examination without abnormal findings: Secondary | ICD-10-CM

## 2020-03-28 DIAGNOSIS — I2510 Atherosclerotic heart disease of native coronary artery without angina pectoris: Secondary | ICD-10-CM

## 2020-03-28 DIAGNOSIS — A6002 Herpesviral infection of other male genital organs: Secondary | ICD-10-CM

## 2020-03-28 DIAGNOSIS — K52832 Lymphocytic colitis: Secondary | ICD-10-CM

## 2020-03-28 DIAGNOSIS — E1169 Type 2 diabetes mellitus with other specified complication: Secondary | ICD-10-CM

## 2020-03-28 DIAGNOSIS — N183 Chronic kidney disease, stage 3 unspecified: Secondary | ICD-10-CM | POA: Diagnosis not present

## 2020-03-28 DIAGNOSIS — M1A29X0 Drug-induced chronic gout, multiple sites, without tophus (tophi): Secondary | ICD-10-CM

## 2020-03-28 DIAGNOSIS — K529 Noninfective gastroenteritis and colitis, unspecified: Secondary | ICD-10-CM

## 2020-03-28 DIAGNOSIS — E1159 Type 2 diabetes mellitus with other circulatory complications: Secondary | ICD-10-CM

## 2020-03-28 DIAGNOSIS — Z7189 Other specified counseling: Secondary | ICD-10-CM

## 2020-03-28 DIAGNOSIS — E118 Type 2 diabetes mellitus with unspecified complications: Secondary | ICD-10-CM

## 2020-03-28 DIAGNOSIS — Z8673 Personal history of transient ischemic attack (TIA), and cerebral infarction without residual deficits: Secondary | ICD-10-CM

## 2020-03-28 DIAGNOSIS — D809 Immunodeficiency with predominantly antibody defects, unspecified: Secondary | ICD-10-CM | POA: Insufficient documentation

## 2020-03-28 DIAGNOSIS — E669 Obesity, unspecified: Secondary | ICD-10-CM

## 2020-03-28 DIAGNOSIS — E559 Vitamin D deficiency, unspecified: Secondary | ICD-10-CM

## 2020-03-28 MED ORDER — ACYCLOVIR 400 MG PO TABS: 400.0000 mg | ORAL_TABLET | Freq: Every day | ORAL | 1 refills | Status: DC

## 2020-03-28 MED ORDER — ALLOPURINOL 100 MG PO TABS: 100.0000 mg | ORAL_TABLET | Freq: Every day | ORAL | 3 refills | Status: DC

## 2020-03-28 MED ORDER — LOVASTATIN 40 MG PO TABS: 80.0000 mg | ORAL_TABLET | Freq: Every day | ORAL | 3 refills | Status: DC

## 2020-03-28 MED ORDER — ATENOLOL 25 MG PO TABS
25.0000 mg | ORAL_TABLET | Freq: Every day | ORAL | 3 refills | Status: DC
Start: 2020-03-28 — End: 2021-02-28

## 2020-03-28 MED ORDER — LOVASTATIN 40 MG PO TABS: 80.0000 mg | ORAL_TABLET | Freq: Every day | ORAL | 0 refills | Status: DC

## 2020-03-28 NOTE — Assessment & Plan Note (Signed)
Continue aspirin, statin.  

## 2020-03-28 NOTE — Assessment & Plan Note (Deleted)
Advanced directive - continues working on living will at home - will check on this. HCPOA - wife.

## 2020-03-28 NOTE — Assessment & Plan Note (Signed)
Will reassess once better blood pressures.

## 2020-03-28 NOTE — Assessment & Plan Note (Signed)
Chronic, trig elevated but improving. Continue lovastatin 80mg  daily. The ASCVD Risk score Thomas Bussing DC Jr., Thomas al., Thomas Carlson) failed to calculate for the following reasons:   The patient has a prior MI or stroke diagnosis

## 2020-03-28 NOTE — Assessment & Plan Note (Addendum)
Planning to return to ortho this month to evaluate possible knee replacement subluxation. Advised limit walking and use knee brace until seen.

## 2020-03-28 NOTE — Assessment & Plan Note (Addendum)

## 2020-03-28 NOTE — Assessment & Plan Note (Signed)
Low IgG and IgM on testing 11/2019 at [facility]. Will refer to immunology clinic. See above regarding recent chronic diarrhea that only responded to EnteroGam.

## 2020-03-28 NOTE — Assessment & Plan Note (Addendum)
Congratulated on marked improvement after 50lb weight loss over the past 2 years. I think reasonable to try off metformin, but if he notes trending sugars up, to restart once daily metformin. He agrees with plan.

## 2020-03-28 NOTE — Assessment & Plan Note (Signed)
Was off allopurinol for 3 weeks, now back on. Urate levels elevated likely due to this. No changes at this time.

## 2020-03-28 NOTE — Assessment & Plan Note (Addendum)
Continue aspirin, statin.  [facility]

## 2020-03-28 NOTE — Assessment & Plan Note (Signed)
Continue 1000 IU daily, levels normal.

## 2020-03-28 NOTE — Assessment & Plan Note (Signed)
See above. Improved on EnteroGam. Will refer to immunology clinic.

## 2020-03-28 NOTE — Assessment & Plan Note (Signed)
Appreciate [facility]. Had refractory chronic diarrhea presumed lymphocytic colitis. Finally responded to 2 wk course of EnteraGam.

## 2020-03-28 NOTE — Assessment & Plan Note (Signed)
Stable period on once daily acyclovir.

## 2020-03-28 NOTE — Progress Notes (Signed)
This visit was conducted in person.  BP 108/60 (BP Location: Left Arm, Patient Position: Sitting, Cuff Size: Normal)   Pulse 69   Temp 97.9 F (36.6 C) (Temporal)   Ht 5\' 8"  (1.727 m)   Wt 210 lb 4 oz (95.4 kg)   SpO2 95%   BMI 31.97 kg/m   BP Readings from Last 3 Encounters:  03/28/20 108/60  09/13/19 137/85  09/03/19 140/62    CC: CPE Subjective:    Patient ID: Thomas Carlson, male    DOB: January 01, 1946, 74 y.o.   MRN: [id number]  HPI: Thomas Carlson is a 74 y.o. male presenting on 03/28/2020 for Medicare Wellness   Did not see health advisor this year.    Hearing Screening   125Hz  250Hz  500Hz  1000Hz  2000Hz  3000Hz  4000Hz  6000Hz  8000Hz   Right ear:   20 20 20  20     Left ear:   40 40 20  40    Vision Screening Comments: Last eye exam, within past yr    Office Visit from 03/28/2020 in [place] at [facility] Total Score  0    No hearing changes noted.  Fall Risk  03/28/2020 03/01/2019 02/13/2018 10/10/2017 08/09/2016  Falls in the past year? 0 0 No No No    Recent evaluation for chronic diarrhea presumed refractory lymphocytic colitis negative for neuroendocrine tumor (PET scan). Saw [facility]. No EPI, infectious diarrhea. Managing diarrhea with imodium + lomotil PRN. Found to have low immunoglobulin levels - referred to immunology. Started on Enteragam - this is the only thing that finally stopped diarrhea (mid April). Now having 1 formed stool a day.   Ongoing weight loss attributed to chronic diarrhea due to loss of taste - has only been eating soups. Weight has stabilized over the past 1.5 months. Taste has now returned.   Recurrent R knee pain (s/p surgery 10/2018) - planning to see Dr Mack Guise in 2 weeks. Feels knee can pop out. Planning to start using brace.   Preventative: COLONOSCOPY WITH PROPOFOL 11/10/2015;diverticulosis,biopsy showedmicroscopic colitis [name], MD) COLONOSCOPY 08/26/2018 - TA, rpt 3 yrs ([place], Tally Due) Prostate -  requests screening QOyr,due 2021, nocturia x2 Flu shotyearly  Pneumovax 06/2012, prevnar10/2017 COVID vaccine - [place] completed 11/2019  Td- 2012 zostavax2011 shingrix - discussed  Advanced directive - continues working on living will at home. HCPOA - wife. Seat belt use discuss  Sunscreen use discussed, no changing moles on skin. Non smoker Alcohol - none Dentist Q6 mo Eye exam yearly  Bowel - stable period.  Bladder - no incontinence   Caffeine: 2 cans of diet soda/day  Lives with wife. 1 dog at home. 2 grown children  Occupation: Advertising copywriter at [employer], current [employer] 2015 Edu: Master's degree  Activity: walking (fit bit), limited by Rknee Diet: some water, lots of diet sodas, fruits/vegetables occasionally, cutting back on carbs     Relevant past medical, surgical, family and social history reviewed and updated as indicated. Interim medical history since our last visit reviewed. Allergies and medications reviewed and updated. Outpatient Medications Prior to Visit  Medication Sig Dispense Refill  . acetaminophen (TYLENOL) 500 MG tablet Take 1,000 mg by mouth every 4 (four) hours as needed for fever or pain.     Marland Kitchen aspirin EC 81 MG tablet Take 1 tablet (81 mg total) by mouth daily.    . Biotin 1000 MCG tablet Take 1,000 mcg by mouth daily.     . Cyanocobalamin (  B-12) 1000 MCG SUBL Place 1 tablet under the tongue daily.    . Magnesium 500 MG TABS Take 500 mg by mouth daily.    Marland Kitchen acyclovir (ZOVIRAX) 400 MG tablet TAKE 1 TABLET (400 MG TOTAL) BY MOUTH DAILY. 90 tablet 1  . allopurinol (ZYLOPRIM) 100 MG tablet Take 1 tablet (100 mg total) by mouth daily. 90 tablet 0  . atenolol-chlorthalidone (TENORETIC) 50-25 MG tablet TAKE 1 TABLET EVERY DAY 90 tablet 1  . lovastatin (MEVACOR) 40 MG tablet TAKE 2 TABLETS (80 MG TOTAL) BY MOUTH AT BEDTIME. 180 tablet 1  . metFORMIN (GLUCOPHAGE) 500 MG tablet TAKE 1 TABLET THREE TIMES DAILY BEFORE MEALS 270 tablet  1  . potassium chloride SA (KLOR-CON) 20 MEQ tablet TAKE 1 TABLET TWICE DAILY 180 tablet 1  . cholecalciferol (VITAMIN D3) 25 MCG (1000 UT) tablet Take 1,000 Units by mouth daily.     . cholestyramine (QUESTRAN) 4 g packet Take 1 packet (4 g total) by mouth 3 (three) times daily with meals. 90 each 0  . loperamide (IMODIUM A-D) 2 MG tablet Take 1 tablet (2 mg total) by mouth 4 (four) times daily as needed for diarrhea or loose stools. 30 tablet 0  . Multiple Vitamin (MULTIVITAMIN) tablet Take 1 tablet by mouth daily.    . ondansetron (ZOFRAN) 4 MG tablet Take 4 mg by mouth every 8 (eight) hours as needed.    . vitamin C (ASCORBIC ACID) 500 MG tablet Take 500 mg by mouth daily.     No facility-administered medications prior to visit.     Per HPI unless specifically indicated in ROS section below Review of Systems  Constitutional: Negative for activity change, appetite change, chills, fatigue, fever and unexpected weight change.  HENT: Negative for hearing loss.   Eyes: Negative for visual disturbance.  Respiratory: Negative for cough, chest tightness, shortness of breath and wheezing.   Cardiovascular: Negative for chest pain, palpitations and leg swelling.  Gastrointestinal: Positive for diarrhea (actually improving). Negative for abdominal distention, abdominal pain, blood in stool, constipation, nausea and vomiting.  Genitourinary: Negative for difficulty urinating and hematuria.  Musculoskeletal: Positive for arthralgias (B shoulders, R knee - managed with scheduled tylenol). Negative for myalgias and neck pain.  Skin: Negative for rash.  Neurological: Positive for headaches. Negative for dizziness, seizures and syncope.  Hematological: Negative for adenopathy. Bruises/bleeds easily.  Psychiatric/Behavioral: Negative for dysphoric mood. The patient is not nervous/anxious.    Objective:  BP 108/60 (BP Location: Left Arm, Patient Position: Sitting, Cuff Size: Normal)   Pulse 69   Temp  97.9 F (36.6 C) (Temporal)   Ht 5\' 8"  (1.727 m)   Wt 210 lb 4 oz (95.4 kg)   SpO2 95%   BMI 31.97 kg/m   Wt Readings from Last 3 Encounters:  03/28/20 210 lb 4 oz (95.4 kg)  09/13/19 227 lb 12.8 oz (103.3 kg)  09/03/19 233 lb 6 oz (105.9 kg)      Physical Exam Vitals and nursing note reviewed.  Constitutional:      General: He is not in acute distress.    Appearance: Normal appearance. He is well-developed. He is not ill-appearing.  HENT:     Head: Normocephalic and atraumatic.     Right Ear: Hearing, tympanic membrane, ear canal and external ear normal.     Left Ear: Hearing, tympanic membrane, ear canal and external ear normal.  Eyes:     General: No scleral icterus.    Extraocular Movements: Extraocular movements  intact.     Conjunctiva/sclera: Conjunctivae normal.     Pupils: Pupils are equal, round, and reactive to light.  Cardiovascular:     Rate and Rhythm: Normal rate and regular rhythm.     Pulses: Normal pulses.          Radial pulses are 2+ on the right side and 2+ on the left side.     Heart sounds: Murmur (systolic) present.  Pulmonary:     Effort: Pulmonary effort is normal. No respiratory distress.     Breath sounds: Normal breath sounds. No wheezing, rhonchi or rales.  Abdominal:     General: Abdomen is flat. Bowel sounds are normal. There is no distension.     Palpations: Abdomen is soft. There is no mass.     Tenderness: There is no abdominal tenderness. There is no guarding or rebound.     Hernia: No hernia is present.  Genitourinary:    Prostate: Enlarged (25gm). Not tender and no nodules present.     Rectum: Normal. No mass, tenderness, anal fissure, external hemorrhoid or internal hemorrhoid. Normal anal tone.  Musculoskeletal:        General: Normal range of motion.     Cervical back: Normal range of motion and neck supple.     Right lower leg: No edema.     Left lower leg: No edema.  Lymphadenopathy:     Cervical: No cervical adenopathy.   Skin:    General: Skin is warm and dry.     Findings: No rash.  Neurological:     General: No focal deficit present.     Mental Status: He is alert and oriented to person, place, and time.     Comments:  CN grossly intact, station and gait intact Recall 3/3 Calculation 5/5 DLROW  Psychiatric:        Mood and Affect: Mood normal.        Behavior: Behavior normal.        Thought Content: Thought content normal.        Judgment: Judgment normal.       Results for orders placed or performed in visit on 03/21/20  PSA, Medicare  Result Value Ref Range   PSA 0.11 0.10 - 4.00 ng/ml  CBC with Differential/Platelet  Result Value Ref Range   WBC 5.6 4.0 - 10.5 K/uL   RBC 4.09 (L) 4.22 - 5.81 Mil/uL   Hemoglobin 13.3 13.0 - 17.0 g/dL   HCT 39.3 39.0 - 52.0 %   MCV 96.0 78.0 - 100.0 fl   MCHC 34.0 30.0 - 36.0 g/dL   RDW 14.2 11.5 - 15.5 %   Platelets 242.0 150.0 - 400.0 K/uL   Neutrophils Relative % 52.0 43.0 - 77.0 %   Lymphocytes Relative 31.0 12.0 - 46.0 %   Monocytes Relative 12.7 (H) 3.0 - 12.0 %   Eosinophils Relative 3.4 0.0 - 5.0 %   Basophils Relative 0.9 0.0 - 3.0 %   Neutro Abs 2.9 1.4 - 7.7 K/uL   Lymphs Abs 1.7 0.7 - 4.0 K/uL   Monocytes Absolute 0.7 0.1 - 1.0 K/uL   Eosinophils Absolute 0.2 0.0 - 0.7 K/uL   Basophils Absolute 0.1 0.0 - 0.1 K/uL  Uric acid  Result Value Ref Range   Uric Acid, Serum 8.8 (H) 4.0 - 7.8 mg/dL  VITAMIN D 25 Hydroxy (Vit-D Deficiency, Fractures)  Result Value Ref Range   VITD 42.70 30.00 - 100.00 ng/mL  Microalbumin / creatinine urine ratio  Result Value Ref Range   Microalb, Ur <0.7 0.0 - 1.9 mg/dL   Creatinine,U 121.2 mg/dL   Microalb Creat Ratio 0.6 0.0 - 30.0 mg/g  Hemoglobin A1c  Result Value Ref Range   Hgb A1c MFr Bld 5.7 4.6 - 6.5 %  Comprehensive metabolic panel  Result Value Ref Range   Sodium 140 135 - 145 mEq/L   Potassium 4.2 3.5 - 5.1 mEq/L   Chloride 105 96 - 112 mEq/L   CO2 28 19 - 32 mEq/L   Glucose, Bld 106  (H) 70 - 99 mg/dL   BUN 32 (H) 6 - 23 mg/dL   Creatinine, Ser 1.51 (H) 0.40 - 1.50 mg/dL   Total Bilirubin 0.6 0.2 - 1.2 mg/dL   Alkaline Phosphatase 64 39 - 117 U/L   AST 13 0 - 37 U/L   ALT 9 0 - 53 U/L   Total Protein 6.0 6.0 - 8.3 g/dL   Albumin 4.1 3.5 - 5.2 g/dL   GFR 45.29 (L) >60.00 mL/min   Calcium 9.5 8.4 - 10.5 mg/dL  Lipid panel  Result Value Ref Range   Cholesterol 143 0 - 200 mg/dL   Triglycerides 187.0 (H) 0.0 - 149.0 mg/dL   HDL 36.50 (L) >39.00 mg/dL   VLDL 37.4 0.0 - 40.0 mg/dL   LDL Cholesterol 69 0 - 99 mg/dL   Total CHOL/HDL Ratio 4    NonHDL 106.70    Assessment & Plan:  This visit occurred during the SARS-CoV-2 public health emergency.  Safety protocols were in place, including screening questions prior to the visit, additional usage of staff PPE, and extensive cleaning of exam room while observing appropriate contact time as indicated for disinfecting solutions.   Problem List Items Addressed This Visit    Vitamin D deficiency    Continue 1000 IU daily, levels normal.       Total knee replacement status, right    Planning to return to ortho this month to evaluate possible knee replacement subluxation. Advised limit walking and use knee brace until seen.       Obesity, Class I, BMI 30-34.9    Marked weight loss to date, due to chronic diarrhea but pt motivated to keep weight off. Chronic medical conditions are better managed after weight loss.       Medicare annual wellness visit, subsequent - Primary    I have personally reviewed the Medicare Annual Wellness questionnaire and have noted 1. The patient's medical and social history 2. Their use of alcohol, tobacco or illicit drugs 3. Their current medications and supplements 4. The patient's functional ability including ADL's, fall risks, home safety risks and hearing or visual impairment. Cognitive function has been assessed and addressed as indicated.  5. Diet and physical activity 6. Evidence for  depression or mood disorders The patients weight, height, BMI have been recorded in the chart. I have made referrals, counseling and provided education to the patient based on review of the above and I have provided the pt with a written personalized care plan for preventive services. Provider list updated.. See scanned questionairre as needed for further documentation. Reviewed preventative protocols and updated unless pt declined.       Lymphocytic colitis    Appreciate [facility]. Had refractory chronic diarrhea presumed lymphocytic colitis. Finally responded to 2 wk course of EnteraGam.       Immunoglobulin deficiency (HCC)    Low IgG and IgM on testing 11/2019 at [facility]. Will refer to immunology clinic.  See above regarding recent chronic diarrhea that only responded to EnteroGam.       Hypertension associated with type 2 diabetes mellitus (HCC)    Chronic, BP now low with worsening kidney function concern for overtreatment. Will change antihypertensive off chlorthalidone onto atenolol 25mg  daily, with option to increase to BID dosing if needed.      Relevant Medications   lovastatin (MEVACOR) 40 MG tablet   atenolol (TENORMIN) 25 MG tablet   Other Relevant Orders   Magnesium   Renal function panel   History of cerebrovascular accident (CVA) in adulthood    Continue aspirin, statin.      Healthcare maintenance    Preventative protocols reviewed and updated unless pt declined. Discussed healthy diet and lifestyle.       Genital herpes    Stable period on once daily acyclovir.       Relevant Medications   acyclovir (ZOVIRAX) 400 MG tablet   Dyslipidemia associated with type 2 diabetes mellitus (HCC)    Chronic, trig elevated but improving. Continue lovastatin 80mg  daily. The ASCVD Risk score Mikey Bussing DC Jr., et al., 2013) failed to calculate for the following reasons:   The patient has a prior MI or stroke diagnosis       Relevant Medications   lovastatin (MEVACOR) 40 MG  tablet   Diabetes mellitus type 2, controlled, with complications ([place])    Congratulated on marked improvement after 50lb weight loss over the past 2 years. I think reasonable to try off metformin, but if he notes trending sugars up, to restart once daily metformin. He agrees with plan.       Relevant Medications   lovastatin (MEVACOR) 40 MG tablet   CKD stage 3 due to type 2 diabetes mellitus ([place])    Will reassess once better blood pressures.       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 Urate levels elevated likely due to this. No changes at this time.       Relevant Orders   Uric acid   Chronic diarrhea    See above. Improved on EnteroGam. Will refer to immunology clinic.       CAD (coronary artery disease)    Continue aspirin, statin.  Sees [facility]      Relevant Medications   lovastatin (MEVACOR) 40 MG tablet   atenolol (TENORMIN) 25 MG tablet   Advanced care planning/counseling discussion       Meds ordered this encounter  Medications  . lovastatin (MEVACOR) 40 MG tablet    Sig: Take 2 tablets (80 mg total) by mouth at bedtime.    Dispense:  180 tablet    Refill:  3  . allopurinol (ZYLOPRIM) 100 MG tablet    Sig: Take 1 tablet (100 mg total) by mouth daily.    Dispense:  90 tablet    Refill:  3  . acyclovir (ZOVIRAX) 400 MG tablet    Sig: Take 1 tablet (400 mg total) by mouth daily.    Dispense:  90 tablet    Refill:  1  . DISCONTD: lovastatin (MEVACOR) 40 MG tablet    Sig: Take 2 tablets (80 mg total) by mouth at bedtime.    Dispense:  30 tablet    Refill:  0  . atenolol (TENORMIN) 25 MG tablet  Sig: Take 1 tablet (25 mg total) by mouth daily.    Dispense:  90 tablet    Refill:  3    Note new med  . DISCONTD: lovastatin (MEVACOR) 40 MG tablet    Sig: Take 2 tablets (80 mg total) by  mouth at bedtime.    Dispense:  60 tablet    Refill:  0   Orders Placed This Encounter  Procedures  . Magnesium    Standing Status:   Future    Standing Expiration Date:   03/28/2021  . Renal function panel    Standing Status:   Future    Standing Expiration Date:   03/28/2021  . Uric acid    Standing Status:   Future    Standing Expiration Date:   03/28/2021    Patient instructions: With weight loss, I think we can back off some of your medicines. Change blood pressure medicine to atenolol 25mg  once daily. If BP starts creeping up, may increase to 25mg  twice daily.  Stop metformin. If sugars start going up, may restart once daily metformin.  Stop potassium.  We will refer you to [facility] immunology clinic.  Return in 1-2 months for lab visit only, return in 6 months for follow up visit.   Follow up plan: Return in about 6 months (around 09/27/2020) for follow up visit.  Ria Bush, MD

## 2020-03-28 NOTE — Assessment & Plan Note (Signed)
Preventative protocols reviewed and updated unless pt declined. Discussed healthy diet and lifestyle.  

## 2020-03-28 NOTE — Assessment & Plan Note (Signed)
Chronic, BP now low with worsening kidney function concern for overtreatment. Will change antihypertensive off chlorthalidone onto atenolol 25mg  daily, with option to increase to BID dosing if needed.

## 2020-03-28 NOTE — Assessment & Plan Note (Signed)
Marked weight loss to date, due to chronic diarrhea but pt motivated to keep weight off. Chronic medical conditions are better managed after weight loss.

## 2020-03-28 NOTE — Patient Instructions (Addendum)
With weight loss, I think we can back off some of your medicines. Change blood pressure medicine to atenolol 25mg  once daily. If BP starts creeping up, may increase to 25mg  twice daily.  Stop metformin. If sugars start going up, may restart once daily metformin.  Stop potassium.  We will refer you to [facility] immunology clinic.  Return in 1-2 months for lab visit only, return in 6 months for follow up visit.   Health Maintenance After Age 44 After age 88, you are at a higher risk for certain long-term diseases and infections as well as injuries from falls. Falls are a major cause of broken bones and head injuries in people who are older than age 110. Getting regular preventive care can help to keep you healthy and well. Preventive care includes getting regular testing and making lifestyle changes as recommended by your health care provider. Talk with your health care provider about:  Which screenings and tests you should have. A screening is a test that checks for a disease when you have no symptoms.  A diet and exercise plan that is right for you. What should I know about screenings and tests to prevent falls? Screening and testing are the best ways to find a health problem early. Early diagnosis and treatment give you the best chance of managing medical conditions that are common after age 10. Certain conditions and lifestyle choices may make you more likely to have a fall. Your health care provider may recommend:  Regular vision checks. Poor vision and conditions such as cataracts can make you more likely to have a fall. If you wear glasses, make sure to get your prescription updated if your vision changes.  Medicine review. Work with your health care provider to regularly review all of the medicines you are taking, including over-the-counter medicines. Ask your health care provider about any side effects that may make you more likely to have a fall. Tell your health care provider if any medicines  that you take make you feel dizzy or sleepy.  Osteoporosis screening. Osteoporosis is a condition that causes the bones to get weaker. This can make the bones weak and cause them to break more easily.  Blood pressure screening. Blood pressure changes and medicines to control blood pressure can make you feel dizzy.  Strength and balance checks. Your health care provider may recommend certain tests to check your strength and balance while standing, walking, or changing positions.  Foot health exam. Foot pain and numbness, as well as not wearing proper footwear, can make you more likely to have a fall.  Depression screening. You may be more likely to have a fall if you have a fear of falling, feel emotionally low, or feel unable to do activities that you used to do.  Alcohol use screening. Using too much alcohol can affect your balance and may make you more likely to have a fall. What actions can I take to lower my risk of falls? General instructions  Talk with your health care provider about your risks for falling. Tell your health care provider if: ? You fall. Be sure to tell your health care provider about all falls, even ones that seem minor. ? You feel dizzy, sleepy, or off-balance.  Take over-the-counter and prescription medicines only as told by your health care provider. These include any supplements.  Eat a healthy diet and maintain a healthy weight. A healthy diet includes low-fat dairy products, low-fat (lean) meats, and fiber from whole grains, beans, and  lots of fruits and vegetables. Home safety  Remove any tripping hazards, such as rugs, cords, and clutter.  Install safety equipment such as grab bars in bathrooms and safety rails on stairs.  Keep rooms and walkways well-lit. Activity   Follow a regular exercise program to stay fit. This will help you maintain your balance. Ask your health care provider what types of exercise are appropriate for you.  If you need a cane  or walker, use it as recommended by your health care provider.  Wear supportive shoes that have nonskid soles. Lifestyle  Do not drink alcohol if your health care provider tells you not to drink.  If you drink alcohol, limit how much you have: ? 0-1 drink a day for women. ? 0-2 drinks a day for men.  Be aware of how much alcohol is in your drink. In the [place] one drink equals one typical bottle of beer (12 oz), one-half glass of wine (5 oz), or one shot of hard liquor (1 oz).  Do not use any products that contain nicotine or tobacco, such as cigarettes and e-cigarettes. If you need help quitting, ask your health care provider. Summary  Having a healthy lifestyle and getting preventive care can help to protect your health and wellness after age 3.  Screening and testing are the best way to find a health problem early and help you avoid having a fall. Early diagnosis and treatment give you the best chance for managing medical conditions that are more common for people who are older than age 37.  Falls are a major cause of broken bones and head injuries in people who are older than age 78. Take precautions to prevent a fall at home.  Work with your health care provider to learn what changes you can make to improve your health and wellness and to prevent falls. This information is not intended to replace advice given to you by your health care provider. Make sure you discuss any questions you have with your health care provider. Document Revised: 02/04/2019 Document Reviewed: 08/27/2017 Elsevier Patient Education  2020 [employer].

## 2020-03-30 ENCOUNTER — Other Ambulatory Visit: Payer: Self-pay

## 2020-03-30 MED ORDER — LOVASTATIN 40 MG PO TABS: 80.0000 mg | ORAL_TABLET | Freq: Every day | ORAL | 0 refills | Status: DC

## 2020-03-30 NOTE — Telephone Encounter (Signed)
Pt left v/m requesting status of lovastatin 40 mg refill to [facility] [address]. Pt request cb. From med list appears med was sent to [place].Please advise.

## 2020-03-30 NOTE — Telephone Encounter (Signed)
Spoke to pt. He needed 30 days to local pharmacy until mail order arrives. I sent that in.

## 2020-04-10 ENCOUNTER — Telehealth: Payer: Self-pay

## 2020-04-10 ENCOUNTER — Other Ambulatory Visit: Payer: Self-pay | Admitting: Family Medicine

## 2020-04-10 DIAGNOSIS — E118 Type 2 diabetes mellitus with unspecified complications: Secondary | ICD-10-CM

## 2020-04-10 DIAGNOSIS — E1159 Type 2 diabetes mellitus with other circulatory complications: Secondary | ICD-10-CM

## 2020-04-10 NOTE — Chronic Care Management (AMB) (Signed)
Chronic Care Management Pharmacy  Name: Thomas Carlson  MRN: [id number] DOB: 1946-04-25  Chief Complaint/ HPI  Thomas Carlson,  74 y.o., male presents for their Initial CCM visit with the clinical pharmacist via telephone. PCP : Thomas Bush, MD  Their chronic conditions include: HTN, CAD, OSA, T2DM, CKD, dyslipidemia, BPH  Patient concerns: denies health concerns, reports diarrhea has resolved   Office Visits:  03/28/20: PCP visit - HTN, stop chlorthalidone and potassium, DM, trial off metformin, pt lost 50 lbs in past 2 years, gout, was off allopurinol for 3 weeks now back on, continue aspirin, statin  09/03/19: PCP visit - DM follow up  Consult Visit:  12/02/19: Thomas Carlson - take imodium OTC with lomotil 1 of each, 30 minutes before each meal and bedtime. May increase to 2 imodium and 1 lomotil QID.   Allergies  Allergen Reactions   Gabapentin Other (See Comments)    unknown   Bactrim [Sulfamethoxazole-Trimethoprim] Rash    Diffuse drug reaction - maculopapular rash   Penicillins Hives    Has patient had a PCN reaction causing immediate rash, facial/tongue/throat swelling, SOB or lightheadedness with hypotension: Unknown Has patient had a PCN reaction causing severe rash involving mucus membranes or skin necrosis: Unknown Has patient had a PCN reaction that required hospitalization: Unknown Has patient had a PCN reaction occurring within the last 10 years: Unknown If all of the above answers are "NO", then may proceed with Cephalosporin use.    Lasix [Furosemide] Other (See Comments)    Drops blood pressure and drained potassium and magnesium   Medications: Outpatient Encounter Medications as of 04/11/2020  Medication Sig   acetaminophen (TYLENOL) 500 MG tablet Take 1,000 mg by mouth every 4 (four) hours as needed for fever or pain.    acyclovir (ZOVIRAX) 400 MG tablet Take 1 tablet (400 mg total) by mouth daily.   allopurinol (ZYLOPRIM) 100 MG tablet Take 1  tablet (100 mg total) by mouth daily.   aspirin EC 81 MG tablet Take 1 tablet (81 mg total) by mouth daily.   atenolol (TENORMIN) 25 MG tablet Take 1 tablet (25 mg total) by mouth daily.   Biotin 1000 MCG tablet Take 1,000 mcg by mouth daily.    Cyanocobalamin (B-12) 1000 MCG SUBL Place 1 tablet under the tongue daily.   lovastatin (MEVACOR) 40 MG tablet Take 2 tablets (80 mg total) by mouth at bedtime.   Magnesium 500 MG TABS Take 500 mg by mouth daily.   No facility-administered encounter medications on file as of 04/11/2020.   Current Diagnosis/Assessment:   Emergency planning/management officer Strain: Low Risk    Difficulty of Paying Living Expenses: Not hard at all   Goals     Patient Stated     Starting 03/01/19, I will continue to take medications as prescribed.      Pharmacy Care Plan     CARE PLAN ENTRY  Current Barriers:   Chronic Disease Management support, education, and care coordination needs related to Hypertension and Diabetes   Hypertension BP Readings from Last 3 Encounters:  03/28/20 108/60  09/13/19 137/85  09/03/19 140/62   Pharmacist Clinical Goal(s): o Over the next 3 months, patient will work with PharmD and providers to maintain BP goal <140/90 mmHg  Current regimen:  o Atenolol 25 mg - 1 tablet daily  Interventions: o Recommend monitoring home blood pressure due to recent medication change on 03/28/20  Patient self care activities - Over the next 3 months, patient will: o  Check BP weekly, document, and provide at future appointment with PharmD o Ensure daily salt intake < 2300 mg/day  Diabetes Lab Results  Component Value Date/Time   HGBA1C 5.7 03/21/2020 10:46 AM   HGBA1C 8.6 (H) 08/24/2019 03:30 PM   Pharmacist Clinical Goal(s): o Over the next 3 months, patient will work with PharmD and providers to maintain A1c goal <7%  Current regimen:  o Metformin 500 mg - 1 tablet daily  Interventions: o Per PCP note, patient may discontinue metformin due  to excellent A1c   Patient self care activities - Over the next 3 months, patient will: o Discontinue metformin o Exercise with goal of 30 minutes, 5 days per week o Incorporate a healthy diet high in vegetables, fruits and whole grains with low-fat dairy products, chicken, fish, legumes, non-tropical vegetable oils and nuts. Limit intake of sweets, sugar-sweetened beverages and red meats.  Initial goal documentation       Hypertension   CMP Latest Ref Rng & Units 03/21/2020 09/27/2019 09/16/2019  Glucose 70 - 99 mg/dL 106(H) 105(H) 135(H)  BUN 6 - 23 mg/dL 32(H) 15 13  Creatinine 0.40 - 1.50 mg/dL 1.51(H) 1.31(H) 1.44(H)  Sodium 135 - 145 mEq/L 140 141 145(H)  Potassium 3.5 - 5.1 mEq/L 4.2 3.3(L) 4.1  Chloride 96 - 112 mEq/L 105 100 102  CO2 19 - 32 mEq/L 28 23 23   Calcium 8.4 - 10.5 mg/dL 9.5 9.0 9.7  Total Protein 6.0 - 8.3 g/dL 6.0 - -  Total Bilirubin 0.2 - 1.2 mg/dL 0.6 - -  Alkaline Phos 39 - 117 U/L 64 - -  AST 0 - 37 U/L 13 - -  ALT 0 - 53 U/L 9 - -   Office blood pressures are: BP Readings from Last 3 Encounters:  03/28/20 108/60  09/13/19 137/85  09/03/19 140/62   CPAP: never had one, denies knowledge of history of sleep apnea  Patient has failed these meds in the past: atenolol-chlorthalidone --> stopped 03/28/20 along with potassium Patient checks BP at home: has wrist cuff  Patient home BP readings are ranging: none reported  Patient is currently controlled on the following medications:   Atenolol 25 mg - 1 tablet daily  We discussed: confirmed changing to atenolol 03/28/20 as BP was well controlled   Plan: Continue current medications  Hyperlipidemia   Lipid Panel     Component Value Date/Time   CHOL 143 03/21/2020 1046   CHOL 162 06/27/2011 0000   TRIG 187.0 (H) 03/21/2020 1046   TRIG 163 05/19/2014 0000   HDL 36.50 (L) 03/21/2020 1046   LDLCALC 69 03/21/2020 1046   LDLCALC 86 05/19/2014 0000   LDLDIRECT 69.0 03/02/2019 0919    The ASCVD Risk  score (Goff DC Jr., et al., 2013) failed to calculate for the following reasons:   The patient has a prior MI or stroke diagnosis   LDL goal < 70 Patient has failed these meds in past: none reported Patient is currently controlled on the following medications:   Lovastatin 40 mg - 1 tablet daily  Aspirin 81 mg - 1 tablet daily  We discussed: confirmed adherence  Plan: Continue current medications  Diabetes   Recent Relevant Labs: Lab Results  Component Value Date/Time   HGBA1C 5.7 03/21/2020 10:46 AM   HGBA1C 8.6 (H) 08/24/2019 03:30 PM   MICROALBUR <0.7 03/21/2020 10:46 AM   MICROALBUR 3.3 (H) 03/02/2019 10:42 AM    Checking BG: infrequently  Patient has failed these meds in  past: none  Patient is currently controlled on the following medications:   Metformin 500 mg - 1 tablet daily   Last diabetic eye exam:  Lab Results  Component Value Date/Time   HMDIABEYEEXA No Retinopathy 09/16/2018 12:00 AM    Last diabetic foot exam:  PCP 03/28/20  We discussed: Per PCP 03/28/20, pt may stop metformin. Pt reduced to once daily metformin instead. Informed pt, he may stop altogether. We will monitor A1c.   Plan: May discontinue metformin per PCP. Continue control with diet and exercise   Gout   Uric Acid 02/13/18 7.5 Patient has failed these meds in past: none reported Patient is currently controlled on the following medications:   Allopurinol 100 mg - 1 tablet daily  We discussed: denies any flares since starting allopurinol, was out of medication for 3 weeks due to mail order, but did not have a flare   Plan: Continue current medications   Vitamin D Deficiency   Vitamin D 03/21/20 43 Patient has failed these meds in past: none reported  Patient is currently controlled on the following medications:   No pharmacotherapy  We discussed: has not been on vitamin D 1000 IU daily for several months, stopped multivitamin as well; level has remained within normal limits  Plan:  Continue current medications  Misc Meds   Patient is currently on the following medications:   Vitamin B12 1000 mcg - 1 tablet daily   Magnesium 500 mg - 1 daily (noted low magnesium 09/27/19)  Biotin 1000 mcg - 1 tablet daily (hair loss)  Acyclovir 400 mg - 1 tablet daily (genital herpes)  Tylenol 500 mg - 1 tablet daily PRN back pain   We discussed: confirmed taking all of the above, no concerns; not taking lomotil or imodium as no longer has diarrhea   Plan: Continue current medications   Vaccines   Reviewed and discussed patient's vaccination history.    Immunization History  Administered Date(s) Administered   Fluad Quad(high Dose 65+) 08/12/2019   Influenza,inj,Quad PF,6+ Mos 10/10/2017, 07/15/2018   Influenza-Unspecified 07/18/2015   PFIZER SARS-COV-2 Vaccination 11/17/2019, 12/08/2019   Pneumococcal Conjugate-13 08/09/2016   Pneumococcal Polysaccharide-23 07/10/2012   Td 10/28/2010   Zoster 10/28/2009   Plan: Recommended patient receive Shingrix  Medication Management  Pharmacy/Benefits: [employer]/Mail order - autorefill, denies concerns   Adherence: refills timely  Affordability: denies concerns   CCM Follow Up: 3 months, telephone to assess BP and A1c   Debbora Dus, PharmD Clinical Pharmacist [facility] at [facility] [phone]

## 2020-04-10 NOTE — Telephone Encounter (Signed)
Per written referral from PCP, requesting referral in Epic for [name] to chronic care management pharmacy services for the following conditions:   Essential hypertension, benign  [I10]  Diabetes mellitus type 2, controlled, with complications [I31.2]  Debbora Dus, PharmD Clinical Pharmacist [facility] at [facility] [phone]

## 2020-04-11 ENCOUNTER — Ambulatory Visit: Payer: Medicare HMO

## 2020-04-11 ENCOUNTER — Telehealth: Payer: Self-pay | Admitting: *Deleted

## 2020-04-11 ENCOUNTER — Other Ambulatory Visit: Payer: Self-pay

## 2020-04-11 DIAGNOSIS — I152 Hypertension secondary to endocrine disorders: Secondary | ICD-10-CM

## 2020-04-11 DIAGNOSIS — E118 Type 2 diabetes mellitus with unspecified complications: Secondary | ICD-10-CM

## 2020-04-11 NOTE — Telephone Encounter (Signed)
Patient left a voicemail stating that he was suppose to have a phone visit with someone today and has not gotten a call yet.

## 2020-04-11 NOTE — Telephone Encounter (Signed)
Reached patient by telephone at 2:40 PM and completed appointment, thank you for the notification.  Debbora Dus, PharmD Clinical Pharmacist [facility] at [facility] [phone]

## 2020-04-12 DIAGNOSIS — M25361 Other instability, right knee: Secondary | ICD-10-CM | POA: Diagnosis not present

## 2020-04-24 DIAGNOSIS — M25361 Other instability, right knee: Secondary | ICD-10-CM | POA: Diagnosis not present

## 2020-04-26 NOTE — Patient Instructions (Addendum)
Dear Thomas Carlson,  It was a pleasure meeting you during our initial appointment on April 10, 2020. Below is a summary of the goals we discussed and components of chronic care management. Please contact me anytime with questions or concerns.   Visit Information  Goals Addressed            This Visit's Progress   . Pharmacy Care Plan       CARE PLAN ENTRY  Current Barriers:  . Chronic Disease Management support, education, and care coordination needs related to Hypertension and Diabetes   Hypertension BP Readings from Last 3 Encounters:  03/28/20 108/60  09/13/19 137/85  09/03/19 140/62 .  Pharmacist Clinical Goal(s): o Over the next 3 months, patient will work with PharmD and providers to maintain BP goal <140/90 mmHg . Current regimen:  o Atenolol 25 mg - 1 tablet daily . Interventions: o Recommend monitoring home blood pressure due to recent medication change on 03/28/20 . Patient self care activities - Over the next 3 months, patient will: o Check BP weekly, document, and provide at future appointment with PharmD o Ensure daily salt intake < 2300 mg/day  Diabetes Lab Results  Component Value Date/Time   HGBA1C 5.7 03/21/2020 10:46 AM   HGBA1C 8.6 (H) 08/24/2019 03:30 PM .  Pharmacist Clinical Goal(s): o Over the next 3 months, patient will work with PharmD and providers to maintain A1c goal <7% . Current regimen:  o Metformin 500 mg - 1 tablet daily . Interventions: o Per PCP note, patient may discontinue metformin due to excellent A1c  . Patient self care activities - Over the next 3 months, patient will: o Discontinue metformin o Exercise with goal of 30 minutes, 5 days per week o Incorporate a healthy diet high in vegetables, fruits and whole grains with low-fat dairy products, chicken, fish, legumes, non-tropical vegetable oils and nuts. Limit intake of sweets, sugar-sweetened beverages and red meats.  Initial goal documentation      Thomas Carlson was given  information about Chronic Care Management services today including:  1. CCM service includes personalized support from designated clinical staff supervised by his physician, including individualized plan of care and coordination with other care providers 2. 24/7 contact phone numbers for assistance for urgent and routine care needs. 3. Standard insurance, coinsurance, copays and deductibles apply for chronic care management only during months in which we provide at least 20 minutes of these services. Most insurances cover these services at 100%, however patients may be responsible for any copay, coinsurance and/or deductible if applicable. This service may help you avoid the need for more expensive face-to-face services. 4. Only one practitioner may furnish and bill the service in a calendar month. 5. The patient may stop CCM services at any time (effective at the end of the month) by phone call to the office staff.  Patient agreed to services and verbal consent obtained.   The patient verbalized understanding of instructions provided today and agreed to receive a mailed copy of patient instruction and/or educational materials. Telephone follow up appointment with pharmacy team member scheduled for: Sept. 14, 2021 at 3:00 PM (telephone) to review home blood pressure and A1c  Debbora Dus, PharmD Clinical Pharmacist [facility] [place]   [place]ce stands for "Dietary Approaches to Stop Hypertension." The DASH eating plan is a healthy eating plan that has been shown to reduce high blood pressure (hypertension). It may also reduce your risk for type 2  diabetes, heart disease, and stroke. The DASH eating plan may also help with weight loss. What are tips for following this plan?  General guidelines  Avoid eating more than 2,300 mg (milligrams) of salt (sodium) a day. If you have hypertension, you may need to reduce your sodium intake to 1,500 mg a  day.  Limit alcohol intake to no more than 1 drink a day for nonpregnant women and 2 drinks a day for men. One drink equals 12 oz of beer, 5 oz of wine, or 1 oz of hard liquor.  Work with your health care provider to maintain a healthy body weight or to lose weight. Ask what an ideal weight is for you.  Get at least 30 minutes of exercise that causes your heart to beat faster (aerobic exercise) most days of the week. Activities may include walking, swimming, or biking.  Work with your health care provider or diet and nutrition specialist (dietitian) to adjust your eating plan to your individual calorie needs. Reading food labels   Check food labels for the amount of sodium per serving. Choose foods with less than 5 percent of the Daily Value of sodium. Generally, foods with less than 300 mg of sodium per serving fit into this eating plan.  To find whole grains, look for the word "whole" as the first word in the ingredient list. Shopping  Buy products labeled as "low-sodium" or "no salt added."  Buy fresh foods. Avoid canned foods and premade or frozen meals. Cooking  Avoid adding salt when cooking. Use salt-free seasonings or herbs instead of table salt or sea salt. Check with your health care provider or pharmacist before using salt substitutes.  Do not fry foods. Cook foods using healthy methods such as baking, boiling, grilling, and broiling instead.  Cook with heart-healthy oils, such as olive, canola, soybean, or sunflower oil. Meal planning  Eat a balanced diet that includes: ? 5 or more servings of fruits and vegetables each day. At each meal, try to fill half of your plate with fruits and vegetables. ? Up to 6-8 servings of whole grains each day. ? Less than 6 oz of lean meat, poultry, or fish each day. A 3-oz serving of meat is about the same size as a deck of cards. One egg equals 1 oz. ? 2 servings of low-fat dairy each day. ? A serving of nuts, seeds, or beans 5 times  each week. ? Heart-healthy fats. Healthy fats called Omega-3 fatty acids are found in foods such as flaxseeds and coldwater fish, like sardines, salmon, and mackerel.  Limit how much you eat of the following: ? Canned or prepackaged foods. ? Food that is high in trans fat, such as fried foods. ? Food that is high in saturated fat, such as fatty meat. ? Sweets, desserts, sugary drinks, and other foods with added sugar. ? Full-fat dairy products.  Do not salt foods before eating.  Try to eat at least 2 vegetarian meals each week.  Eat more home-cooked food and less restaurant, buffet, and fast food.  When eating at a restaurant, ask that your food be prepared with less salt or no salt, if possible. What foods are recommended? The items listed may not be a complete list. Talk with your dietitian about what dietary choices are best for you. Grains Whole-grain or whole-wheat bread. Whole-grain or whole-wheat pasta. Brown rice. [place]. Bulgur. Whole-grain and low-sodium cereals. Pita bread. Low-fat, low-sodium crackers. Whole-wheat flour tortillas. Vegetables Fresh or frozen vegetables (  raw, steamed, roasted, or grilled). Low-sodium or reduced-sodium tomato and vegetable juice. Low-sodium or reduced-sodium tomato sauce and tomato paste. Low-sodium or reduced-sodium canned vegetables. Fruits All fresh, dried, or frozen fruit. Canned fruit in natural juice (without added sugar). Meat and other protein foods Skinless chicken or [place]. Ground chicken or [place]. Pork with fat trimmed off. Fish and seafood. Egg whites. Dried beans, peas, or lentils. Unsalted nuts, nut butters, and seeds. Unsalted canned beans. Lean cuts of beef with fat trimmed off. Low-sodium, lean deli meat. Dairy Low-fat (1%) or fat-free (skim) milk. Fat-free, low-fat, or reduced-fat cheeses. Nonfat, low-sodium ricotta or cottage cheese. Low-fat or nonfat yogurt. Low-fat, low-sodium cheese. Fats and oils Soft margarine  without trans fats. Vegetable oil. Low-fat, reduced-fat, or light mayonnaise and salad dressings (reduced-sodium). Canola, safflower, olive, soybean, and sunflower oils. Avocado. Seasoning and other foods Herbs. Spices. Seasoning mixes without salt. Unsalted popcorn and pretzels. Fat-free sweets. What foods are not recommended? The items listed may not be a complete list. Talk with your dietitian about what dietary choices are best for you. Grains Baked goods made with fat, such as croissants, muffins, or some breads. Dry pasta or rice meal packs. Vegetables Creamed or fried vegetables. Vegetables in a cheese sauce. Regular canned vegetables (not low-sodium or reduced-sodium). Regular canned tomato sauce and paste (not low-sodium or reduced-sodium). Regular tomato and vegetable juice (not low-sodium or reduced-sodium). Angie Fava. Olives. Fruits Canned fruit in a light or heavy syrup. Fried fruit. Fruit in cream or butter sauce. Meat and other protein foods Fatty cuts of meat. Ribs. Fried meat. Berniece Salines. Sausage. Bologna and other processed lunch meats. Salami. Fatback. Hotdogs. Bratwurst. Salted nuts and seeds. Canned beans with added salt. Canned or smoked fish. Whole eggs or egg yolks. Chicken or [place] with skin. Dairy Whole or 2% milk, cream, and half-and-half. Whole or full-fat cream cheese. Whole-fat or sweetened yogurt. Full-fat cheese. Nondairy creamers. Whipped toppings. Processed cheese and cheese spreads. Fats and oils Butter. Stick margarine. Lard. Shortening. Ghee. Bacon fat. Tropical oils, such as coconut, palm kernel, or palm oil. Seasoning and other foods Salted popcorn and pretzels. Onion salt, garlic salt, seasoned salt, table salt, and sea salt. Worcestershire sauce. Tartar sauce. Barbecue sauce. Teriyaki sauce. Soy sauce, including reduced-sodium. Steak sauce. Canned and packaged gravies. Fish sauce. Oyster sauce. Cocktail sauce. Horseradish that you find on the shelf. Ketchup.  Mustard. Meat flavorings and tenderizers. Bouillon cubes. Hot sauce and Tabasco sauce. Premade or packaged marinades. Premade or packaged taco seasonings. Relishes. Regular salad dressings. Where to find more information:  National Heart, Lung, and [place]: https://wilson-eaton.com/  American Heart Association: www.heart.org Summary  The DASH eating plan is a healthy eating plan that has been shown to reduce high blood pressure (hypertension). It may also reduce your risk for type 2 diabetes, heart disease, and stroke.  With the DASH eating plan, you should limit salt (sodium) intake to 2,300 mg a day. If you have hypertension, you may need to reduce your sodium intake to 1,500 mg a day.  When on the DASH eating plan, aim to eat more fresh fruits and vegetables, whole grains, lean proteins, low-fat dairy, and heart-healthy fats.  Work with your health care provider or diet and nutrition specialist (dietitian) to adjust your eating plan to your individual calorie needs. This information is not intended to replace advice given to you by your health care provider. Make sure you discuss any questions you have with your health care provider. Document Revised: 09/26/2017 Document Reviewed: 10/07/2016  Elsevier Patient Education  2020 Elsevier Inc.   

## 2020-04-28 ENCOUNTER — Ambulatory Visit: Payer: Medicare HMO | Admitting: Podiatry

## 2020-04-28 ENCOUNTER — Encounter: Payer: Self-pay | Admitting: Podiatry

## 2020-04-28 ENCOUNTER — Other Ambulatory Visit: Payer: Self-pay

## 2020-04-28 DIAGNOSIS — M778 Other enthesopathies, not elsewhere classified: Secondary | ICD-10-CM | POA: Diagnosis not present

## 2020-04-28 DIAGNOSIS — M10072 Idiopathic gout, left ankle and foot: Secondary | ICD-10-CM

## 2020-04-28 NOTE — Progress Notes (Signed)
   HPI: 74 y.o. male presenting today as a new patient for evaluation of swelling with pain to the left foot that is been going on for approximately 2 weeks.  Patient states that he denies injury or trauma to the area but he did notice a sudden onset of swelling with redness to the left foot.  He does have a history of gout.  He currently takes allopurinol daily from his PCP.  Over the last 2 weeks he is try to stay off of the foot and ice it.  He has noticed significant improvement of the pain associated to the left foot over the past 2 weeks.  Past Medical History:  Diagnosis Date  . Acute posthemorrhagic anemia   . Aortic valve sclerosis 08/13/2016   Sclerosis without stenosis by [place] (08/2016)  . CAD (coronary artery disease) 2005   s/p stent ([place])  . Cholelithiasis    by CT  . Chronic kidney disease, stage 3, mod decreased GFR   . Diabetes mellitus ([place]) 2010  . Diverticulosis    by CT  . DJD (degenerative joint disease)    knee  . Genital herpes   . Gout   . Heart murmur    followed by PCP  . History of arterial ischemic stroke 03/08/2019   Remote anterior frontal and central MCA stroke by MRI 2020 [facility])  . HLD (hyperlipidemia)   . HTN (hypertension)   . Hypocalcemia 09/03/2019  . Knee pain, right    "needs replacement"  . Neuropathy    feet  . Obesity   . Seizures ([place])    x1 - after craniotomy (1960s)  . Weakness of both legs    "since back surgery"     Physical Exam: General: The patient is alert and oriented x3 in no acute distress.  Dermatology: Skin is warm, dry and supple bilateral lower extremities. Negative for open lesions or macerations.  Vascular: Palpable pedal pulses bilaterally.  Mild edema left foot. Capillary refill within normal limits.  Neurological: Epicritic and protective threshold grossly intact bilaterally.   Musculoskeletal Exam: Range of motion within normal limits to all pedal and ankle joints bilateral. Muscle strength 5/5 in all groups  bilateral.  There is some very mild pain on palpation throughout the left midfoot with some associated mild edema to the area  Assessment: 1.  Acute idiopathic gout left foot-improving   Plan of Care:  1. Patient evaluated.  Patient declined x-rays 2.  Continue allopurinol daily from PCP 3.  I did explain that I do suspect this was an acute gout flareup.  Despite taking allopurinol daily patients may still have occasional gout episodes.  The patient agrees and believe this was associated to his gout since there was no history of trauma and no open wound for infection 4.  Continue diet focused on avoiding foods that exacerbate uric acid levels 5.  Return to clinic as needed      Edrick Kins, DPM [facility]  Dr. Edrick Kins, DPM    [address], [place] [address]                Office [phone]  Fax [phone]

## 2020-04-29 NOTE — Progress Notes (Signed)
I have collaborated with the care management provider regarding care management and care coordination activities outlined in this encounter and have reviewed this encounter including documentation in the note and care plan. I am certifying that I agree with the content of this note and encounter as supervising physician.  

## 2020-05-04 DIAGNOSIS — M25361 Other instability, right knee: Secondary | ICD-10-CM | POA: Diagnosis not present

## 2020-05-10 ENCOUNTER — Encounter: Payer: Self-pay | Admitting: Gastroenterology

## 2020-05-10 ENCOUNTER — Ambulatory Visit: Payer: Medicare HMO | Admitting: Gastroenterology

## 2020-05-10 ENCOUNTER — Other Ambulatory Visit: Payer: Self-pay

## 2020-05-10 VITALS — BP 133/71 | HR 69 | Temp 98.1°F | Ht 68.0 in | Wt 218.0 lb

## 2020-05-10 DIAGNOSIS — E1122 Type 2 diabetes mellitus with diabetic chronic kidney disease: Secondary | ICD-10-CM | POA: Diagnosis not present

## 2020-05-10 DIAGNOSIS — K52832 Lymphocytic colitis: Secondary | ICD-10-CM
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
## 2020-05-17 DIAGNOSIS — M25361 Other instability, right knee: Secondary | ICD-10-CM | POA: Diagnosis not present

## 2020-05-25 ENCOUNTER — Other Ambulatory Visit: Payer: Self-pay

## 2020-05-25 ENCOUNTER — Other Ambulatory Visit (INDEPENDENT_AMBULATORY_CARE_PROVIDER_SITE_OTHER): Payer: Medicare HMO

## 2020-05-25 DIAGNOSIS — N183 Chronic kidney disease, stage 3 unspecified: Secondary | ICD-10-CM

## 2020-05-25 DIAGNOSIS — I10 Essential (primary) hypertension: Secondary | ICD-10-CM

## 2020-05-25 DIAGNOSIS — I152 Hypertension secondary to endocrine disorders: Secondary | ICD-10-CM

## 2020-05-25 DIAGNOSIS — E1159 Type 2 diabetes mellitus with other circulatory complications: Secondary | ICD-10-CM | POA: Diagnosis not present

## 2020-05-25 DIAGNOSIS — M1A29X0 Drug-induced chronic gout, multiple sites, without tophus (tophi): Secondary | ICD-10-CM | POA: Diagnosis not present

## 2020-05-25 DIAGNOSIS — M25361 Other instability, right knee: Secondary | ICD-10-CM | POA: Diagnosis not present

## 2020-05-25 DIAGNOSIS — E1122 Type 2 diabetes mellitus with diabetic chronic kidney disease: Secondary | ICD-10-CM

## 2020-05-25 LAB — RENAL FUNCTION PANEL
Albumin: 4 g/dL (ref 3.5–5.2)
BUN: 27 mg/dL — ABNORMAL HIGH (ref 6–23)
CO2: 27 mEq/L (ref 19–32)
Calcium: 9.4 mg/dL (ref 8.4–10.5)
Chloride: 105 mEq/L (ref 96–112)
Creatinine, Ser: 1.48 mg/dL (ref 0.40–1.50)
GFR: 46.33 mL/min — ABNORMAL LOW (ref >60.00)
Glucose, Bld: 95 mg/dL (ref 70–99)
Phosphorus: 4.4 mg/dL (ref 2.3–4.6)
Potassium: 4 mEq/L (ref 3.5–5.1)
Sodium: 139 mEq/L (ref 135–145)

## 2020-05-25 LAB — URIC ACID: Uric Acid, Serum: 7.3 mg/dL (ref 4.0–7.8)

## 2020-05-25 LAB — MAGNESIUM: Magnesium: 1.8 mg/dL (ref 1.5–2.5)

## 2020-07-10 ENCOUNTER — Other Ambulatory Visit: Payer: Self-pay

## 2020-07-10 ENCOUNTER — Ambulatory Visit: Payer: Medicare HMO

## 2020-07-10 DIAGNOSIS — E1159 Type 2 diabetes mellitus with other circulatory complications: Secondary | ICD-10-CM

## 2020-07-10 DIAGNOSIS — E118 Type 2 diabetes mellitus with unspecified complications: Secondary | ICD-10-CM

## 2020-07-10 NOTE — Chronic Care Management (AMB) (Signed)
Chronic Care Management Pharmacy  Name: Thomas Carlson  MRN: [id number] DOB: 1946-05-22  Chief Complaint/ HPI  [name],  74 y.o., male presents for their Follow-Up CCM visit with the clinical pharmacist via telephone. PCP : Ria Bush, MD  Their chronic conditions include: HTN, CAD, OSA, T2DM, CKD, dyslipidemia, BPH  Patient concerns: denies health concerns, reports diarrhea has resolved   Office Visits:  03/28/20: PCP visit - HTN, stop chlorthalidone and potassium, DM, trial off metformin, pt lost 50 lbs in past 2 years, gout, was off allopurinol for 3 weeks now back on, continue aspirin, statin  09/03/19: PCP visit - DM follow up  Consult Visit:  05/09/20: [name] - Follow-up as needed. Recommend surveillance colonoscopy 08/2021.   04/28/20: Podiatry - continue allopurinol daily from PCP. Suspected acute gout flareup.   2/4/21[name] - take imodium OTC with lomotil 1 of each, 30 minutes before each meal and bedtime. May increase to 2 imodium and 1 lomotil QID.   Allergies  Allergen Reactions  . Gabapentin Other (See Comments)    unknown  . Bactrim [Sulfamethoxazole-Trimethoprim] Rash    Diffuse drug reaction - maculopapular rash  . Penicillins Hives    Has patient had a PCN reaction causing immediate rash, facial/tongue/throat swelling, SOB or lightheadedness with hypotension: Unknown Has patient had a PCN reaction causing severe rash involving mucus membranes or skin necrosis: Unknown Has patient had a PCN reaction that required hospitalization: Unknown Has patient had a PCN reaction occurring within the last 10 years: Unknown If all of the above answers are "NO", then may proceed with Cephalosporin use.   . Lasix [Furosemide] Other (See Comments)    Drops blood pressure and drained potassium and magnesium   Medications: Outpatient Encounter Medications as of 07/10/2020  Medication Sig  . acetaminophen (TYLENOL) 500 MG tablet Take 1,000 mg by mouth  every 4 (four) hours as needed for fever or pain.   Marland Kitchen acyclovir (ZOVIRAX) 400 MG tablet Take 1 tablet (400 mg total) by mouth daily.  Marland Kitchen allopurinol (ZYLOPRIM) 100 MG tablet Take 1 tablet (100 mg total) by mouth daily.  Marland Kitchen aspirin EC 81 MG tablet Take 1 tablet (81 mg total) by mouth daily.  Marland Kitchen atenolol (TENORMIN) 25 MG tablet Take 1 tablet (25 mg total) by mouth daily.  . Biotin 1000 MCG tablet Take 1,000 mcg by mouth daily.   . Cyanocobalamin (B-12) 1000 MCG SUBL Place 1 tablet under the tongue daily.  Marland Kitchen lovastatin (MEVACOR) 40 MG tablet Take 2 tablets (80 mg total) by mouth at bedtime.  . Magnesium 500 MG TABS Take 500 mg by mouth daily.   No facility-administered encounter medications on file as of 07/10/2020.   Allergies  Allergen Reactions  . Gabapentin Other (See Comments)    unknown  . Bactrim [Sulfamethoxazole-Trimethoprim] Rash    Diffuse drug reaction - maculopapular rash  . Penicillins Hives    Has patient had a PCN reaction causing immediate rash, facial/tongue/throat swelling, SOB or lightheadedness with hypotension: Unknown Has patient had a PCN reaction causing severe rash involving mucus membranes or skin necrosis: Unknown Has patient had a PCN reaction that required hospitalization: Unknown Has patient had a PCN reaction occurring within the last 10 years: Unknown If all of the above answers are "NO", then may proceed with Cephalosporin use.   . Lasix [Furosemide] Other (See Comments)    Drops blood pressure and drained potassium and magnesium    SDOH Screenings   Alcohol Screen:   .  Last Alcohol Screening Score (AUDIT): Not on file  Depression (PHQ2-9): Low Risk   . PHQ-2 Score: 0  Financial Resource Strain: Low Risk   . Difficulty of Paying Living Expenses: Not hard at all  Food Insecurity:   . Worried About Charity fundraiser in the Last Year: Not on file  . Ran Out of Food in the Last Year: Not on file  Housing:   . Last Housing Risk Score: Not on file    Physical Activity:   . Days of Exercise per Week: Not on file  . Minutes of Exercise per Session: Not on file  Social Connections:   . Frequency of Communication with Friends and Family: Not on file  . Frequency of Social Gatherings with Friends and Family: Not on file  . Attends Religious Services: Not on file  . Active Member of Clubs or Organizations: Not on file  . Attends Archivist Meetings: Not on file  . Marital Status: Not on file  Stress:   . Feeling of Stress : Not on file  Tobacco Use: Low Risk   . Smoking Tobacco Use: Never Smoker  . Smokeless Tobacco Use: Never Used  Transportation Needs:   . Film/video editor (Medical): Not on file  . Lack of Transportation (Non-Medical): Not on file     Current Diagnosis/Assessment:   Financial Resource Strain: Low Risk   . Difficulty of Paying Living Expenses: Not hard at all   Goals    . Patient Stated     Starting 03/01/19, I will continue to take medications as prescribed.     . Pharmacy Care Plan     CARE PLAN ENTRY  Current Barriers:  . Chronic Disease Management support, education, and care coordination needs related to Hypertension and Diabetes   Hypertension BP Readings from Last 3 Encounters:  03/28/20 108/60  09/13/19 137/85  09/03/19 140/62 .  Pharmacist Clinical Goal(s): o Over the next 3 months, patient will work with PharmD and providers to maintain BP goal <140/90 mmHg . Current regimen:  o Atenolol 25 mg - 1 tablet daily . Interventions: o Reviewed patient's blood pressure at home. Readings are below goal.  . Patient self care activities - Over the next 3 months, patient will: o Check BP weekly, document, and provide at future appointment with PharmD o Ensure daily salt intake < 2300 mg/day  Diabetes Lab Results  Component Value Date/Time   HGBA1C 5.7 03/21/2020 10:46 AM   HGBA1C 8.6 (H) 08/24/2019 03:30 PM .  Pharmacist Clinical Goal(s): o Over the next 3 months, patient will  work with PharmD and providers to maintain A1c goal <7% . Current regimen:  o Metformin 500 mg - 1 tablet daily . Interventions: o Per PCP note, patient may discontinue metformin due to excellent A1c  o Patient wants to continue Metformin 500 mg daily until current supply is exhausted. This will carry him until close to next lab work and visit with Dr. Danise Mina. Discussed that patient could cut tablet in half or discontinue at any point.  . Patient self care activities - Over the next 3 months, patient will: o Discontinue metformin o Exercise with goal of 30 minutes, 5 days per week o Incorporate a healthy diet high in vegetables, fruits and whole grains with low-fat dairy products, chicken, fish, legumes, non-tropical vegetable oils and nuts. Limit intake of sweets, sugar-sweetened beverages and red meats.  Please see past updates related to this goal by clicking on the "Past  Updates" button in the selected goal        Hypertension   CMP Latest Ref Rng & Units 05/25/2020 03/21/2020 09/27/2019  Glucose 70 - 99 mg/dL 95 106(H) 105(H)  BUN 6 - 23 mg/dL 27(H) 32(H) 15  Creatinine 0.40 - 1.50 mg/dL 1.48 1.51(H) 1.31(H)  Sodium 135 - 145 mEq/L 139 140 141  Potassium 3.5 - 5.1 mEq/L 4.0 4.2 3.3(L)  Chloride 96 - 112 mEq/L 105 105 100  CO2 19 - 32 mEq/L 27 28 23   Calcium 8.4 - 10.5 mg/dL 9.4 9.5 9.0  Total Protein 6.0 - 8.3 g/dL - 6.0 -  Total Bilirubin 0.2 - 1.2 mg/dL - 0.6 -  Alkaline Phos 39 - 117 U/L - 64 -  AST 0 - 37 U/L - 13 -  ALT 0 - 53 U/L - 9 -   Office blood pressures are: BP Readings from Last 3 Encounters:  05/10/20 133/71  03/28/20 108/60  09/13/19 137/85   CPAP: never had one, denies knowledge of history of sleep apnea  Patient has failed these meds in the past: atenolol-chlorthalidone --> stopped 03/28/20 along with potassium Patient checks BP at home: has wrist cuff  Patient home BP readings are ranging: none reported  Patient is currently controlled on the  following medications:   Atenolol 25 mg - 1 tablet daily  We discussed: confirmed changing to atenolol 03/28/20 as BP was well controlled   Update 07/10/2020 - Normal range BP. 127/70 mmHg. Patient has lost 50 lbs which seems to help blood pressure control.   Plan: Continue current medications   Diabetes   Recent Relevant Labs: Lab Results  Component Value Date/Time   HGBA1C 5.7 03/21/2020 10:46 AM   HGBA1C 8.6 (H) 08/24/2019 03:30 PM   MICROALBUR <0.7 03/21/2020 10:46 AM   MICROALBUR 3.3 (H) 03/02/2019 10:42 AM    Checking BG: infrequently  Patient has failed these meds in past: none  Patient is currently controlled on the following medications:   Metformin 500 mg - 1 tablet daily   Last diabetic eye exam:  Lab Results  Component Value Date/Time   HMDIABEYEEXA No Retinopathy 09/16/2018 12:00 AM    Last diabetic foot exam:  PCP 03/28/20  We discussed: Per PCP 03/28/20, pt may stop metformin. Pt reduced to once daily metformin instead. Informed pt, he may stop altogether. We will monitor A1c.   Update 07/10/2020 - Blood sugar has stayed <120 mg/dL. Patient has maintained 50 lb weight loss after GI issue. Working to maintain weight loss. Knee replacement in January and has been unable to walk as much as previously. Riding his stationary bike at home and trying to strengthen his muscle around knee to avoid it dislocating.. Patient wants to continue Metformin 500 mg daily until current supply is exhausted. This will carry him until close to next lab work and visit with Dr. Danise Mina. Discussed that patient could cut tablet in half or discontinue at any point. Patient acknowledges understanding.    Diet: Eat a lot of fish and chicken. Occasional red meat. Patient mindful of sugar/carbohydrate intake.   Plan: Continue control with diet and exercise   Gout   Uric Acid 02/13/18 7.5 Patient has failed these meds in past: none reported Patient is currently controlled on the following  medications:   Allopurinol 100 mg - 1 tablet daily  We discussed: denies any flares since starting allopurinol, was out of medication for 3 weeks due to mail order, but did not have a flare  Update 07/10/2020 - Had a gout flare in July. Has had symptoms of a flare within the last week. Patient reports continuing to take Allopurinol. Patient reports that he has an OTC topical rub that he applies when it flares and not overly painful.    Plan: Continue current medications    Medication Management  Pharmacy/Benefits: [employer]/Mail order - autorefill, denies concerns   Adherence: refills timely  Affordability: denies concerns   CCM Follow Up: 4 months, telephone to assess BP and updated A1c   Sherre Poot, PharmD, [facility] Clinical Pharmacist [facility] [phone] (office) [phone] (mobile)

## 2020-07-10 NOTE — Patient Instructions (Addendum)
Visit Information  Goals Addressed            This Visit's Progress   . Pharmacy Care Plan       CARE PLAN ENTRY  Current Barriers:  . Chronic Disease Management support, education, and care coordination needs related to Hypertension and Diabetes   Hypertension BP Readings from Last 3 Encounters:  05/10/20 133/71  03/28/20 108/60  09/13/19 137/85 .  Pharmacist Clinical Goal(s): o Over the next 3 months, patient will work with PharmD and providers to maintain BP goal <140/90 mmHg . Current regimen:  o Atenolol 25 mg - 1 tablet daily . Interventions: o Reviewed patient's blood pressure at home. Readings are below goal.  . Patient self care activities - Over the next 3 months, patient will: o Check BP weekly, document, and provide at future appointment with PharmD o Ensure daily salt intake < 2300 mg/day  Diabetes Lab Results  Component Value Date/Time   HGBA1C 5.7 03/21/2020 10:46 AM   HGBA1C 8.6 (H) 08/24/2019 03:30 PM .  Pharmacist Clinical Goal(s): o Over the next 3 months, patient will work with PharmD and providers to maintain A1c goal <7% . Current regimen:  o Metformin 500 mg - 1 tablet daily . Interventions: o Per PCP note, patient may discontinue metformin due to excellent A1c  o Patient wants to continue Metformin 500 mg daily until current supply is exhausted. This will carry him until close to next lab work and visit with Dr. Danise Mina. Discussed that patient could cut tablet in half or discontinue at any point.  . Patient self care activities - Over the next 3 months, patient will: o Discontinue metformin once current supply exhausted. o Exercise with goal of 30 minutes, 5 days per week o Incorporate a healthy diet high in vegetables, fruits and whole grains with low-fat dairy products, chicken, fish, legumes, non-tropical vegetable oils and nuts. Limit intake of sweets, sugar-sweetened beverages and red meats.  Please see past updates related to this goal by  clicking on the "Past Updates" button in the selected goal        The patient verbalized understanding of instructions provided today and declined a print copy of patient instruction materials.   Telephone follow up appointment with pharmacy team member scheduled for: 11/09/2020 at 1 pm   Sherre Poot, PharmD, [facility] Clinical Pharmacist [facility] [phone] (office) [phone] (mobile)   Exercises To Do While Sitting  Exercises that you do while sitting (chair exercises) can give you many of the same benefits as full exercise. Benefits include strengthening your heart, burning calories, and keeping muscles and joints healthy. Exercise can also improve your mood and help with depression and anxiety. You may benefit from chair exercises if you are unable to do standing exercises because of:  Diabetic foot pain.  Obesity.  Illness.  Arthritis.  Recovery from surgery or injury.  Breathing problems.  Balance problems.  Another type of disability. Before starting chair exercises, check with your health care provider or a physical therapist to find out how much exercise you can tolerate and which exercises are safe for you. If your health care provider approves:  Start out slowly and build up over time. Aim to work up to about 10-20 minutes for each exercise session.  Make exercise part of your daily routine.  Drink water when you exercise. Do not wait until you are thirsty. Drink every 10-15 minutes.  Stop exercising right away if you have pain, nausea, shortness of  breath, or dizziness.  If you are exercising in a wheelchair, make sure to lock the wheels.  Ask your health care provider whether you can do tai chi or yoga. Many positions in these mind-body exercises can be modified to do while seated. Warm-up Before starting other exercises: 1. Sit up as straight as you can. Have your knees bent at 90 degrees, which is the shape of the capital letter "L."  Keep your feet flat on the floor. 2. Sit at the front edge of your chair, if you can. 3. Pull in (tighten) the muscles in your abdomen and stretch your spine and neck as straight as you can. Hold this position for a few minutes. 4. Breathe in and out evenly. Try to concentrate on your breathing, and relax your mind. Stretching Exercise A: Arm stretch 1. Hold your arms out straight in front of your body. 2. Bend your hands at the wrist with your fingers pointing up, as if signaling someone to stop. Notice the slight tension in your forearms as you hold the position. 3. Keeping your arms out and your hands bent, rotate your hands outward as far as you can and hold this stretch. Aim to have your thumbs pointing up and your pinkie fingers pointing down. Slowly repeat arm stretches for one minute as tolerated. Exercise B: Leg stretch 1. If you can move your legs, try to "draw" letters on the floor with the toes of your foot. Write your name with one foot. 2. Write your name with the toes of your other foot. Slowly repeat the movements for one minute as tolerated. Exercise C: Reach for the sky 1. Reach your hands as far over your head as you can to stretch your spine. 2. Move your hands and arms as if you are climbing a rope. Slowly repeat the movements for one minute as tolerated. Range of motion exercises Exercise A: Shoulder roll 1. Let your arms hang loosely at your sides. 2. Lift just your shoulders up toward your ears, then let them relax back down. 3. When your shoulders feel loose, rotate your shoulders in backward and forward circles. Do shoulder rolls slowly for one minute as tolerated. Exercise B: March in place 1. As if you are marching, pump your arms and lift your legs up and down. Lift your knees as high as you can. ? If you are unable to lift your knees, just pump your arms and move your ankles and feet up and down. March in place for one minute as tolerated. Exercise C: Seated  jumping jacks 1. Let your arms hang down straight. 2. Keeping your arms straight, lift them up over your head. Aim to point your fingers to the ceiling. 3. While you lift your arms, straighten your legs and slide your heels along the floor to your sides, as wide as you can. 4. As you bring your arms back down to your sides, slide your legs back together. ? If you are unable to use your legs, just move your arms. Slowly repeat seated jumping jacks for one minute as tolerated. Strengthening exercises Exercise A: Shoulder squeeze 1. Hold your arms straight out from your body to your sides, with your elbows bent and your fists pointed at the ceiling. 2. Keeping your arms in the bent position, move them forward so your elbows and forearms meet in front of your face. 3. Open your arms back out as wide as you can with your elbows still bent, until you feel your  shoulder blades squeezing together. Hold for 5 seconds. Slowly repeat the movements forward and backward for one minute as tolerated. Contact a health care provider if you:  Had to stop exercising due to any of the following: ? Pain. ? Nausea. ? Shortness of breath. ? Dizziness. ? Fatigue.  Have significant pain or soreness after exercising. Get help right away if you have:  Chest pain.  Difficulty breathing. These symptoms may represent a serious problem that is an emergency. Do not wait to see if the symptoms will go away. Get medical help right away. Call your local emergency services (911 in the [place] Do not drive yourself to the hospital. This information is not intended to replace advice given to you by your health care provider. Make sure you discuss any questions you have with your health care provider. Document Revised: 02/04/2019 Document Reviewed: 08/27/2017 Elsevier Patient Education  2020 [employer].

## 2020-07-11 ENCOUNTER — Telehealth: Payer: Medicare HMO

## 2020-07-11 NOTE — Progress Notes (Signed)
I have collaborated with the care management provider regarding care management and care coordination activities outlined in this encounter and have reviewed this encounter including documentation in the note and care plan. I am certifying that I agree with the content of this note and encounter as supervising physician.  

## 2020-08-27 ENCOUNTER — Other Ambulatory Visit: Payer: Self-pay | Admitting: Family Medicine

## 2020-09-07 IMAGING — CR DG CHEST 2V
1 series · 2 of 2 positions shown · non-contrast
Comparison: [DATE]

CLINICAL DATA: Pre op clearance, knee surgery [REDACTED]Hx CAD, HTN,
stage 3 CKDNever a smoker

EXAM:
CHEST - 2 VIEW

[Series 1: dg chest 2 view · 0.14mm/px · 2 of 2 slices shown]
[im 1/2]
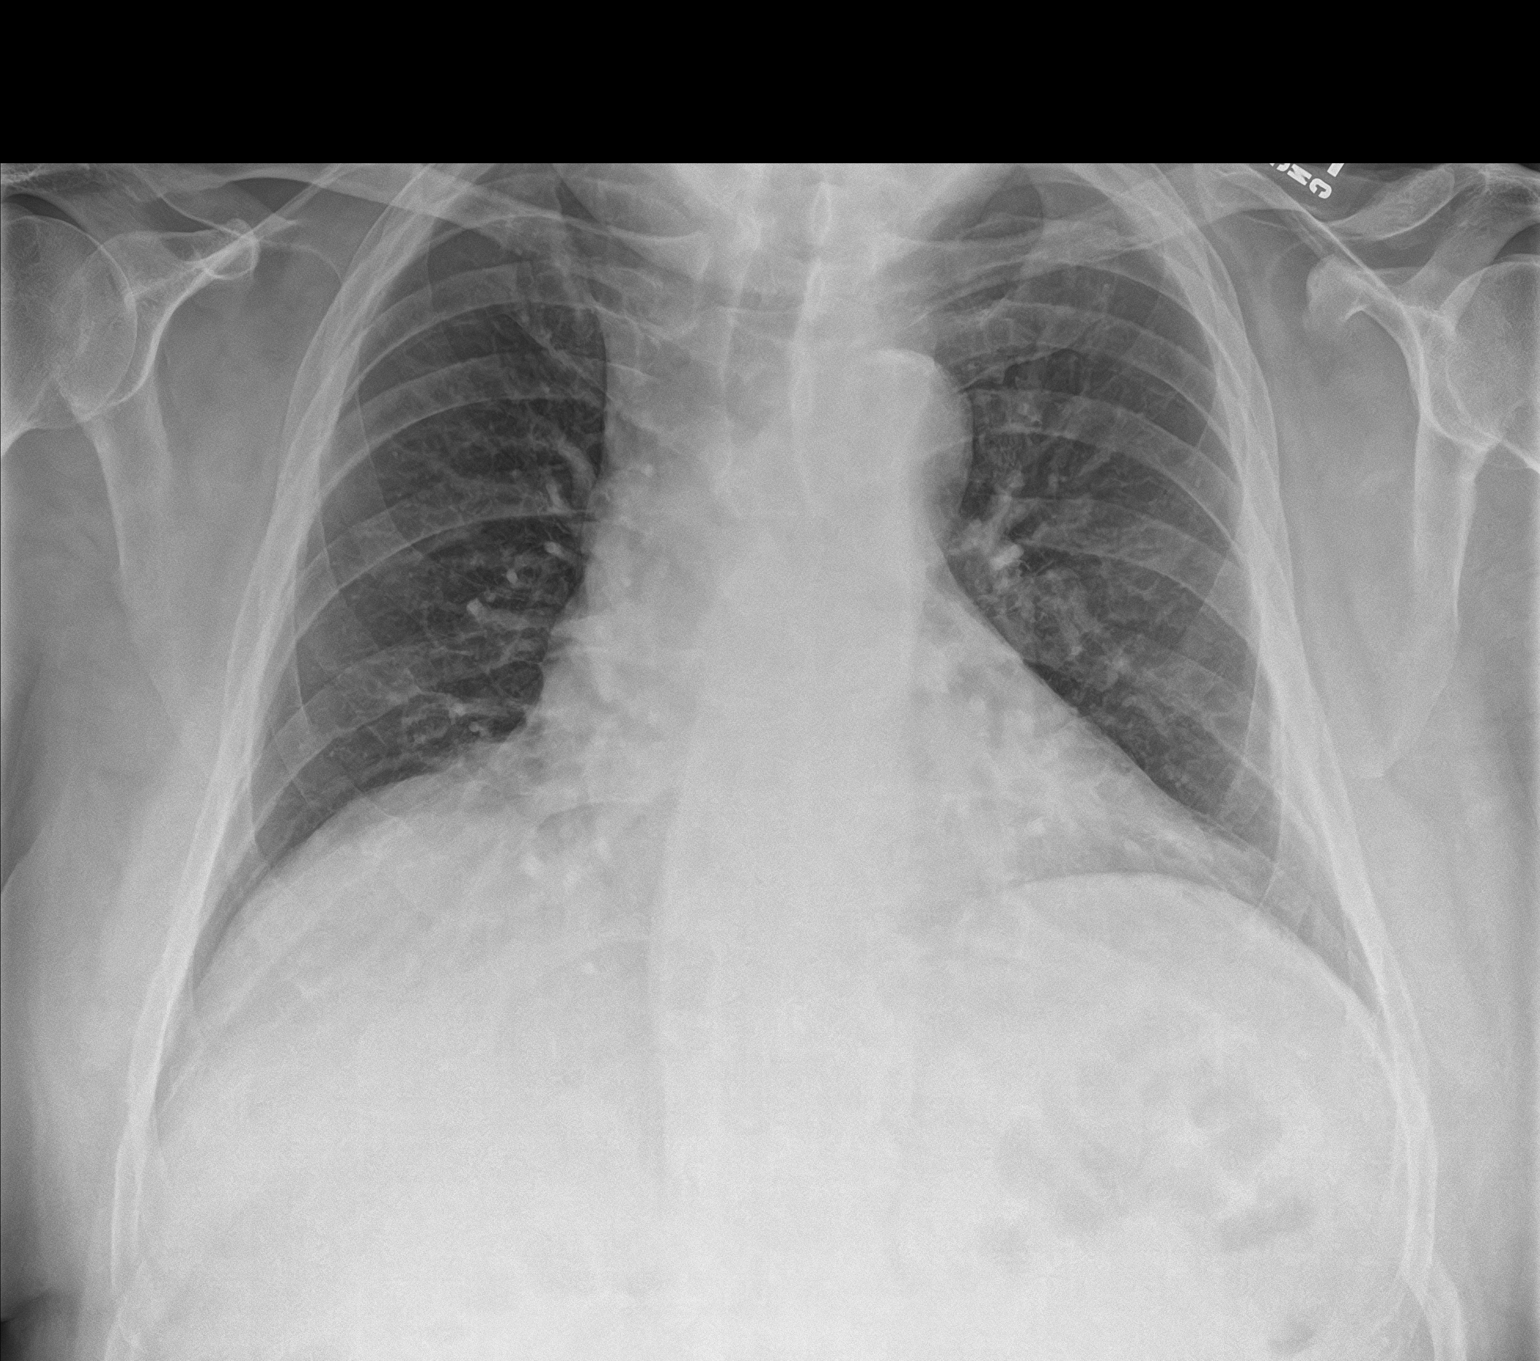
[im 2/2]
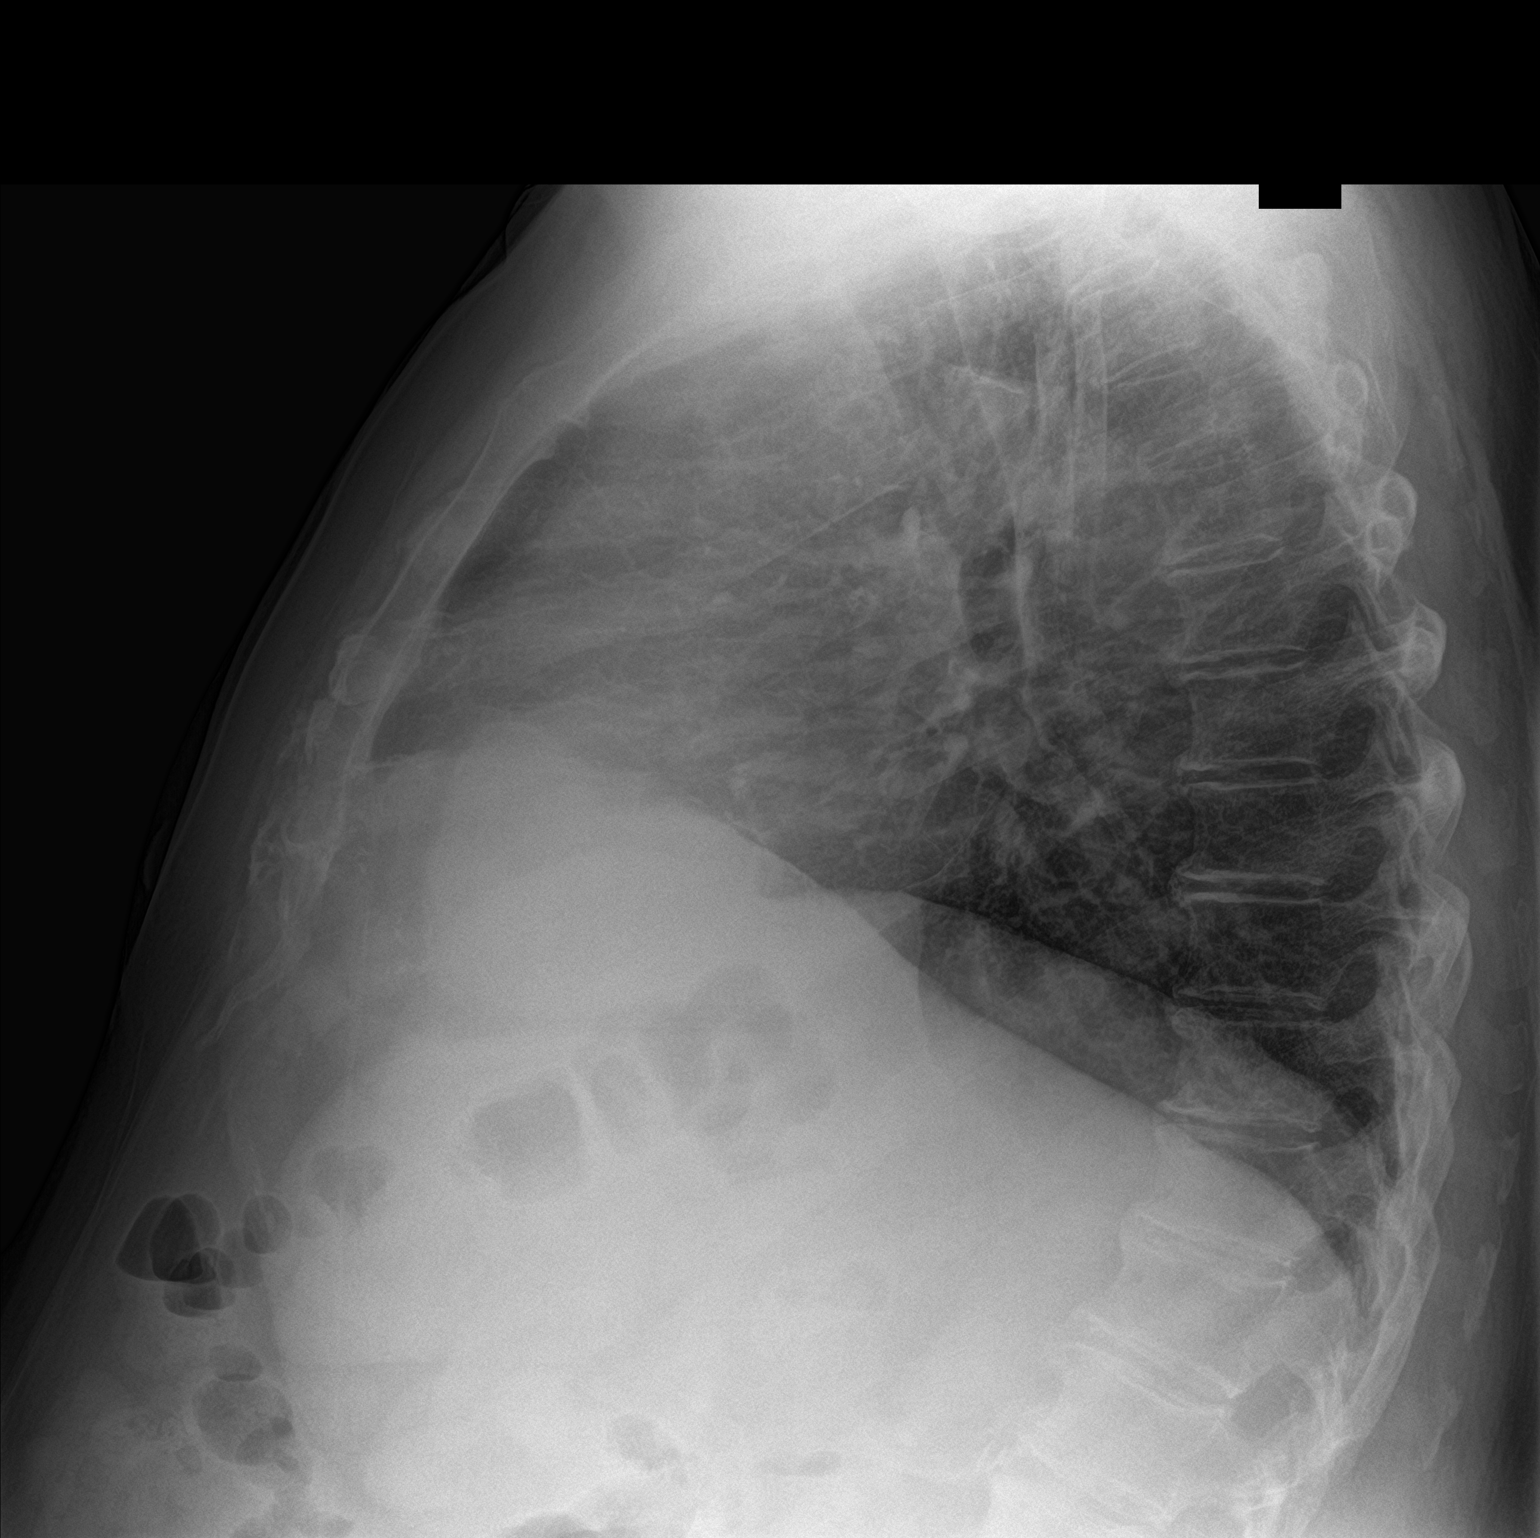

[2 of 2 positions shown; findings below may reference images not displayed]

FINDINGS: Cardiac silhouette is top-normal in size. No mediastinal or hilar
masses. No evidence of adenopathy.

Clear lungs.  No pleural effusion or pneumothorax.

Skeletal structures are intact.
IMPRESSION: No active cardiopulmonary disease.

## 2020-09-27 ENCOUNTER — Ambulatory Visit (INDEPENDENT_AMBULATORY_CARE_PROVIDER_SITE_OTHER): Payer: Medicare HMO | Admitting: Family Medicine

## 2020-09-27 ENCOUNTER — Encounter: Payer: Self-pay | Admitting: Family Medicine

## 2020-09-27 ENCOUNTER — Other Ambulatory Visit: Payer: Self-pay

## 2020-09-27 VITALS — BP 124/62 | HR 64 | Temp 97.6°F | Ht 68.0 in | Wt 217.2 lb

## 2020-09-27 DIAGNOSIS — Z8639 Personal history of other endocrine, nutritional and metabolic disease: Secondary | ICD-10-CM

## 2020-09-27 DIAGNOSIS — E118 Type 2 diabetes mellitus with unspecified complications: Secondary | ICD-10-CM

## 2020-09-27 DIAGNOSIS — I10 Essential (primary) hypertension: Secondary | ICD-10-CM | POA: Diagnosis not present

## 2020-09-27 DIAGNOSIS — M1A29X0 Drug-induced chronic gout, multiple sites, without tophus (tophi): Secondary | ICD-10-CM | POA: Diagnosis not present

## 2020-09-27 DIAGNOSIS — D809 Immunodeficiency with predominantly antibody defects, unspecified: Secondary | ICD-10-CM | POA: Diagnosis not present

## 2020-09-27 DIAGNOSIS — K52832 Lymphocytic colitis: Secondary | ICD-10-CM | POA: Diagnosis not present

## 2020-09-27 DIAGNOSIS — M8949 Other hypertrophic osteoarthropathy, multiple sites: Secondary | ICD-10-CM | POA: Diagnosis not present

## 2020-09-27 DIAGNOSIS — M65341 Trigger finger, right ring finger: Secondary | ICD-10-CM

## 2020-09-27 DIAGNOSIS — M159 Polyosteoarthritis, unspecified: Secondary | ICD-10-CM

## 2020-09-27 LAB — POCT GLYCOSYLATED HEMOGLOBIN (HGB A1C): Hemoglobin A1C: 5.5 % (ref 4.0–5.6)

## 2020-09-27 MED ORDER — ALLOPURINOL 100 MG PO TABS: 200.0000 mg | ORAL_TABLET | Freq: Every day | ORAL | 3 refills | Status: DC

## 2020-09-27 NOTE — Patient Instructions (Addendum)
Gout levels remain elevated - increase allopurinol to 200mg  daily (2 tablets at a time) Congratulations on diabetes reversal!  For joints - start diclofenac topical anti inflammatory gel 2-3 times daily as needed.  May see Dr Lorelei Pont sports medicine or let us know if you'd like referral to hand surgery.  Try exercises for arms (for possible biceps tendonitis) Return in 6 months for physical.

## 2020-09-27 NOTE — Progress Notes (Signed)
Patient ID: Thomas Carlson, male    DOB: 22-Jul-1946, 74 y.o.   MRN: [id number]  This visit was conducted in person.  BP 124/62 (BP Location: Left Arm, Patient Position: Sitting, Cuff Size: Large)   Pulse 64   Temp 97.6 F (36.4 C) (Temporal)   Ht 5\' 8"  (1.727 m)   Wt 217 lb 3 oz (98.5 kg)   SpO2 92%   BMI 33.02 kg/m    CC: 6 mo f/u visit  Subjective:   HPI: Thomas Carlson is a 74 y.o. male presenting on 09/27/2020 for Follow-up (Here for 6 mo f/u.)    Has not seen [place] [facility] clinic. New referral placed  Developing worsening joint pains of hands, R>L shoulders, triggering of R 4th digit affecting ability to open jars. Managing with tylenol 1000mg  2-3 times daily. No redness, swelling or warmth of joints. Taking allopurinol 100mg  daily. Urate levels were recently elevated. No hip or lower back pain. Intermittent dorsal foot pain/warmth/redness thought gout (recently saw podiatry for this)   H/o DM - does regularly check sugars weekly, overall well controlled. Compliant with antihyperglycemic regimen which includes: diet controlled. Denies low sugars or hypoglycemic symptoms. Denies paresthesias. Last diabetic eye exam DUE - upcoming appt this month. Pneumovax: 2017. Prevnar: 2013. Glucometer brand: unsure. DSME: did not see. Lab Results  Component Value Date   HGBA1C 5.5 09/27/2020   Diabetic Foot Exam - Simple   No data filed     Lab Results  Component Value Date   MICROALBUR <0.7 03/21/2020         Relevant past medical, surgical, family and social history reviewed and updated as indicated. Interim medical history since our last visit reviewed. Allergies and medications reviewed and updated. Outpatient Medications Prior to Visit  Medication Sig Dispense Refill  . acetaminophen (TYLENOL) 500 MG tablet Take 1,000 mg by mouth every 4 (four) hours as needed for fever or pain.     Marland Kitchen acyclovir (ZOVIRAX) 400 MG tablet TAKE 1 TABLET EVERY DAY 90 tablet 2  . aspirin  EC 81 MG tablet Take 1 tablet (81 mg total) by mouth daily.    Marland Kitchen atenolol (TENORMIN) 25 MG tablet Take 1 tablet (25 mg total) by mouth daily. 90 tablet 3  . Biotin 1000 MCG tablet Take 1,000 mcg by mouth daily.     . Cyanocobalamin (B-12) 1000 MCG SUBL Place 1 tablet under the tongue daily.    Marland Kitchen lovastatin (MEVACOR) 40 MG tablet Take 2 tablets (80 mg total) by mouth at bedtime. 60 tablet 0  . Magnesium 500 MG TABS Take 500 mg by mouth daily.    Marland Kitchen allopurinol (ZYLOPRIM) 100 MG tablet Take 1 tablet (100 mg total) by mouth daily. 90 tablet 3   No facility-administered medications prior to visit.     Per HPI unless specifically indicated in ROS section below Review of Systems Objective:  BP 124/62 (BP Location: Left Arm, Patient Position: Sitting, Cuff Size: Large)   Pulse 64   Temp 97.6 F (36.4 C) (Temporal)   Ht 5\' 8"  (1.727 m)   Wt 217 lb 3 oz (98.5 kg)   SpO2 92%   BMI 33.02 kg/m   Wt Readings from Last 3 Encounters:  09/27/20 217 lb 3 oz (98.5 kg)  05/10/20 218 lb (98.9 kg)  03/28/20 210 lb 4 oz (95.4 kg)      Physical Exam Vitals and nursing note reviewed.  Constitutional:      Appearance: Normal appearance.  He is not ill-appearing.  Cardiovascular:     Rate and Rhythm: Normal rate and regular rhythm.     Pulses: Normal pulses.     Heart sounds: Murmur (3/6 systolic throughout) heard.   Pulmonary:     Effort: Pulmonary effort is normal. No respiratory distress.     Breath sounds: Normal breath sounds. No wheezing, rhonchi or rales.  Musculoskeletal:        General: Swelling and tenderness present. Normal range of motion.     Right lower leg: No edema.     Left lower leg: No edema.     Comments:  Tender nodule at palmar 4th MC tendon proximal to 4th MCPJ Bilateral shoulder exam: No deformity of shoulders on inspection. No pain with palpation of shoulder landmarks. FROM in abduction and forward flexion. No pain or weakness with testing SITS in ext/int rotation. No  pain with empty can sign. Discomfort with Speed test on left.  Neurological:     Mental Status: He is alert.  Psychiatric:        Mood and Affect: Mood normal.        Behavior: Behavior normal.       Results for orders placed or performed in visit on 09/27/20  POCT glycosylated hemoglobin (Hb A1C)  Result Value Ref Range   Hemoglobin A1C 5.5 4.0 - 5.6 %   HbA1c POC (<> result, manual entry)     HbA1c, POC (prediabetic range)     HbA1c, POC (controlled diabetic range)     Lab Results  Component Value Date   CREATININE 1.48 05/25/2020   BUN 27 (H) 05/25/2020   NA 139 05/25/2020   K 4.0 05/25/2020   CL 105 05/25/2020   CO2 27 05/25/2020    Assessment & Plan:  This visit occurred during the SARS-CoV-2 public health emergency.  Safety protocols were in place, including screening questions prior to the visit, additional usage of staff PPE, and extensive cleaning of exam room while observing appropriate contact time as indicated for disinfecting solutions.   Problem List Items Addressed This Visit    Trigger finger, right ring finger    Discussed etiology as well as treatment options - he will return to see sports medicine Dr Lorelei Pont or let me know if desires ortho referral.       Lymphocytic colitis    Not responsive to budesonide (2020) Only responded to [facility] 2021 through [place].       Relevant Orders   Ambulatory referral to Immunology   Immunoglobulin deficiency (HCC)    Low IgG and IgM through [facility] - labwork 11/2019 in [place]. Plan was referral to immunology clinic however this was never completed. Will refer again.       Relevant Orders   Ambulatory referral to Immunology   Hypertension    Doing well only on atenolol (chlorthalidone stopped last visit)      History of diabetes mellitus, type II - Primary    Has reversed diabetes after 50 lb weight loss over the past 2+ years - congratulated. Will change diagnosis.  Now off metformin, continue low sugar  low carb diet.       Relevant Orders   POCT glycosylated hemoglobin (Hb A1C) (Completed)   DJD (degenerative joint disease)    Diffuse DJD to shoulders, hands - anticipate OA related - discussed topical voltaren use. 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  Will increase allopurinol to 200mg  daily. Monitor for gout flare with change in dose - to let me know for likely prednisone course (avoiding NSAIDS in CKD and CAD)          Meds ordered this encounter  Medications  . allopurinol (ZYLOPRIM) 100 MG tablet    Sig: Take 2 tablets (200 mg total) by mouth daily.    Dispense:  180 tablet    Refill:  3   Orders Placed This Encounter  Procedures  . Ambulatory referral to Immunology    Referral Priority:   Routine    Referral Type:   Consultation    Referral Reason:   Specialty Services Required    Requested Specialty:   Immunology    Number of Visits Requested:   1  . POCT glycosylated hemoglobin (Hb A1C)    Patient Instructions  Gout levels remain elevated - increase allopurinol to 200mg  daily (2 tablets at a time) Congratulations on diabetes reversal!  For joints - start diclofenac topical anti inflammatory gel 2-3 times daily as needed.  May see Dr Lorelei Pont sports medicine or let us know if you'd like referral to hand surgery.  Try exercises for arms (for possible biceps tendonitis) Return in 6 months for physical.    Follow up plan: Return in about 6 months (around 03/28/2021) for annual exam, prior fasting for blood work, medicare wellness visit.  Ria Bush, MD

## 2020-09-28 DIAGNOSIS — M65341 Trigger finger, right ring finger: Secondary | ICD-10-CM | POA: Insufficient documentation

## 2020-09-28 NOTE — Assessment & Plan Note (Signed)
Not responsive to budesonide (2020) Only responded to [facility] 2021 through [place].

## 2020-09-28 NOTE — Assessment & Plan Note (Addendum)
Doing well only on atenolol (chlorthalidone stopped last visit)

## 2020-09-28 NOTE — Assessment & Plan Note (Signed)
Low IgG and IgM through [facility] - labwork 11/2019 in [place]. Plan was referral to immunology clinic however this was never completed. Will refer again.

## 2020-09-28 NOTE — Assessment & Plan Note (Signed)
Urate remains high, ongoing gout flares.  Will increase allopurinol to 200mg  daily. Monitor for gout flare with change in dose - to let me know for likely prednisone course (avoiding NSAIDS in CKD and CAD)

## 2020-09-28 NOTE — Assessment & Plan Note (Signed)
Has reversed diabetes after 50 lb weight loss over the past 2+ years - congratulated. Will change diagnosis.  Now off metformin, continue low sugar low carb diet.

## 2020-09-28 NOTE — Assessment & Plan Note (Addendum)
Diffuse DJD to shoulders, hands - anticipate OA related - discussed topical voltaren use. No active synovitis or concern for inflammatory arthritis at this time

## 2020-09-28 NOTE — Assessment & Plan Note (Signed)
Discussed etiology as well as treatment options - he will return to see sports medicine Dr Lorelei Pont or let me know if desires ortho referral.

## 2020-11-02 IMAGING — MR MR HEAD W/O CM
10 series · 48 of 48 positions shown · non-contrast
Comparison: None.

CLINICAL DATA: 72-year-old male with bilateral lower extremity
weakness following spine surgery in March 2018. History of subdural
hematoma evacuation years ago.

EXAM:
MRI HEAD WITHOUT CONTRAST
TECHNIQUE: Multiplanar, multiecho pulse sequences of the brain and surrounding
structures were obtained without intravenous contrast.

[Series 2: T1 · sagittal · 5.0mm · 0.45mm/px · 3 of 23 slices shown (1 of 2)]
[im 1/23]
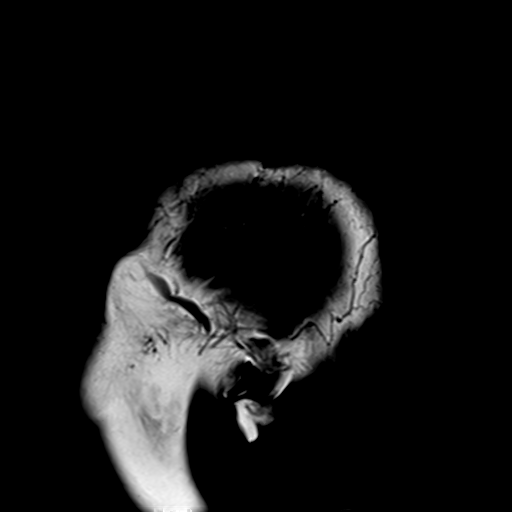
[im 12/23]
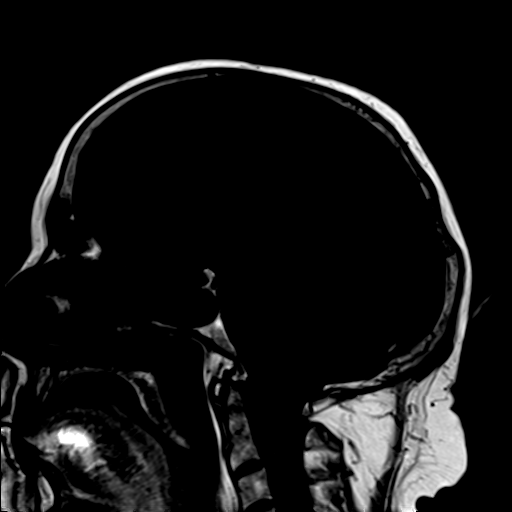
[im 23/23]
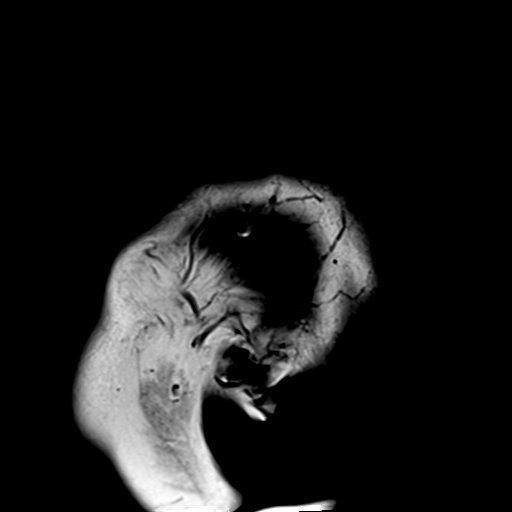

[Series 4: DWI · axial · 3.0mm · 1.20mm/px · z∈[-84,+74]mm · 5 of 55 slices shown (1 of 4)]
[im 1/55]
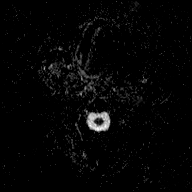
[im 14/55]
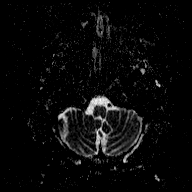
[im 28/55]
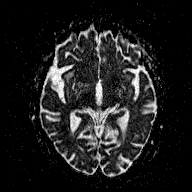
[im 41/55]
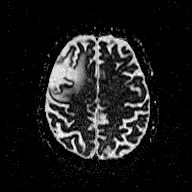
[im 55/55]
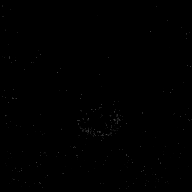

[Series 6: DWI · coronal · 3.0mm · 1.15mm/px · 4 of 48 slices shown (2 of 4)]
[im 1/48]
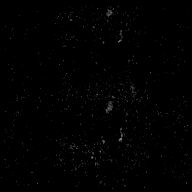
[im 16/48]
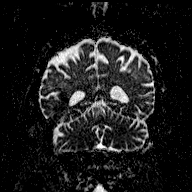
[im 32/48]
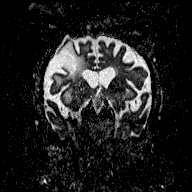
[im 48/48]
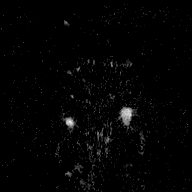

[Series 7: T2 · axial · 5.0mm · 0.72mm/px · z∈[-84,+74]mm · 2 of 24 slices shown (1 of 3)]
[im 1/24]
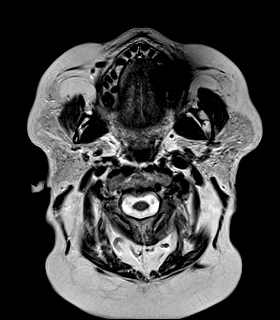
[im 24/24]
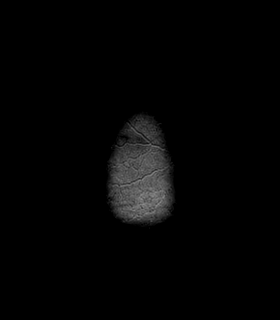

[Series 8: FLAIR · axial · 3.0mm · 0.45mm/px · z∈[-84,+74]mm · 5 of 55 slices shown]
[im 1/55]
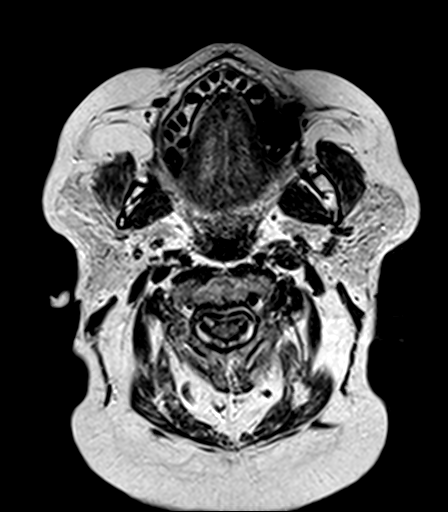
[im 14/55]
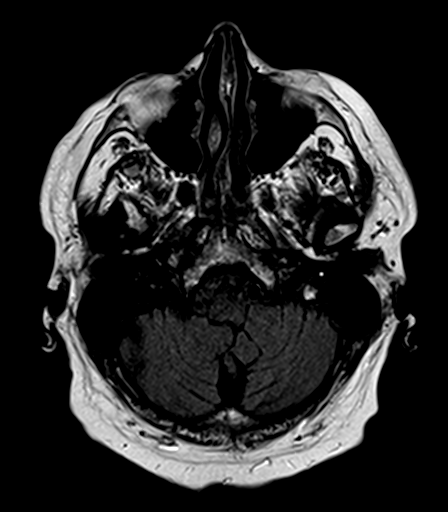
[im 28/55]
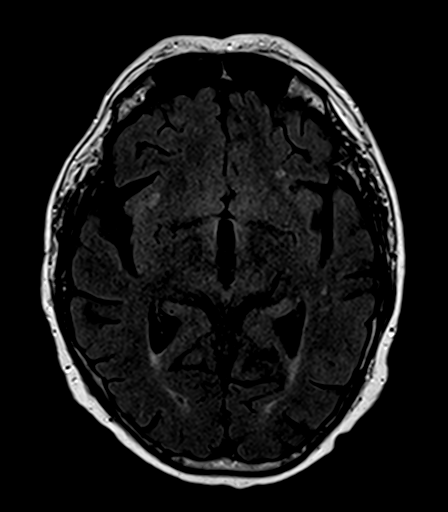
[im 41/55]
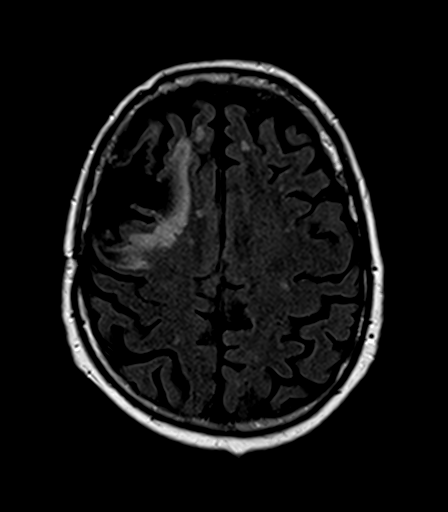
[im 55/55]
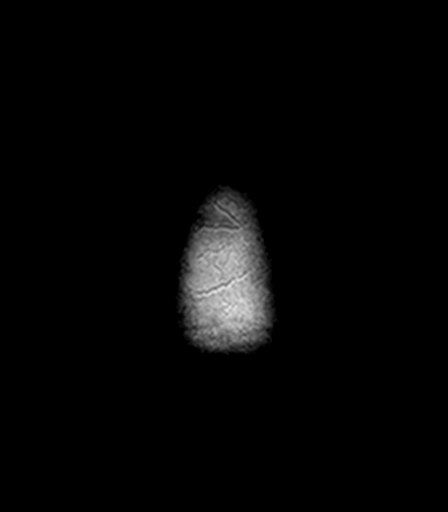

[Series 9: T2 · axial · 5.0mm · 0.72mm/px · z∈[-84,+74]mm · 2 of 24 slices shown (2 of 3)]
[im 1/24]
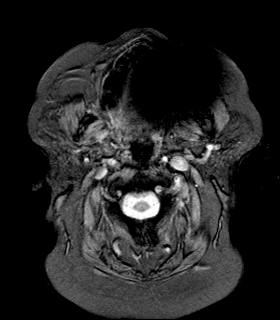
[im 24/24]
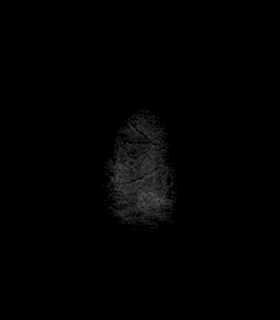

[Series 10: T1 · axial · 1.0mm · 1.00mm/px · z∈[-80,+75]mm · 15 of 160 slices shown (2 of 2)]
[im 1/160]
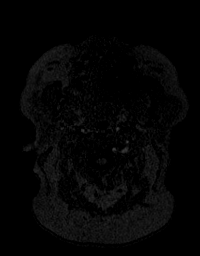
[im 12/160]
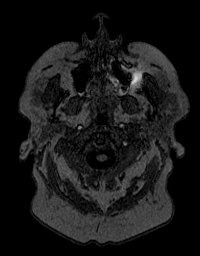
[im 23/160]
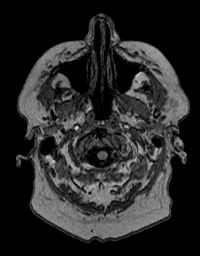
[im 35/160]
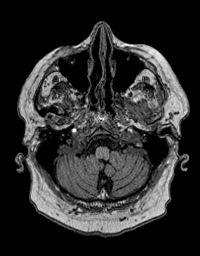
[im 46/160]
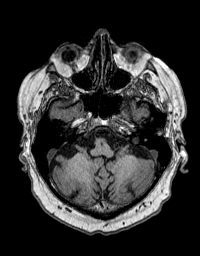
[im 57/160]
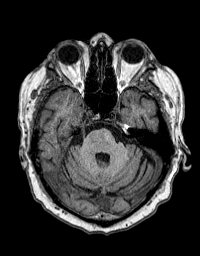
[im 69/160]
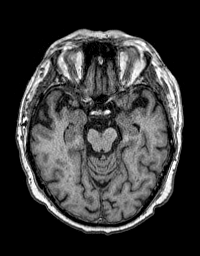
[im 80/160]
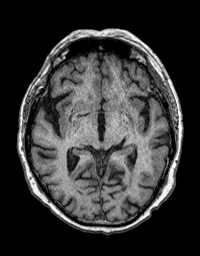
[im 91/160]
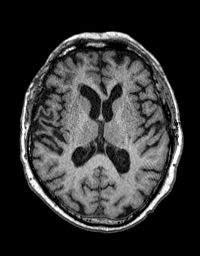
[im 103/160]
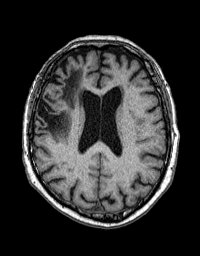
[im 114/160]
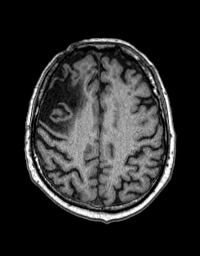
[im 125/160]
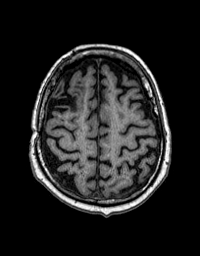
[im 137/160]
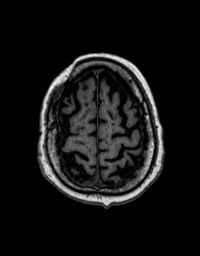
[im 148/160]
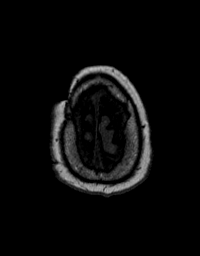
[im 160/160]
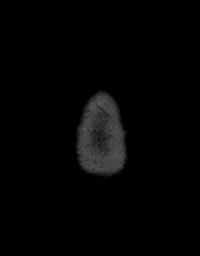

[Series 11: T2 · coronal · 5.0mm · 0.43mm/px · 3 of 31 slices shown (3 of 3)]
[im 1/31]
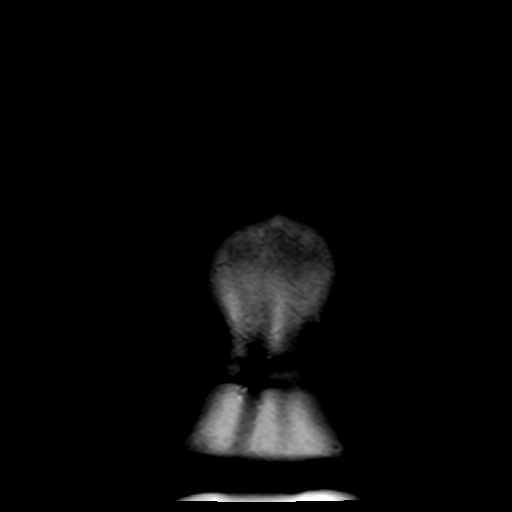
[im 16/31]
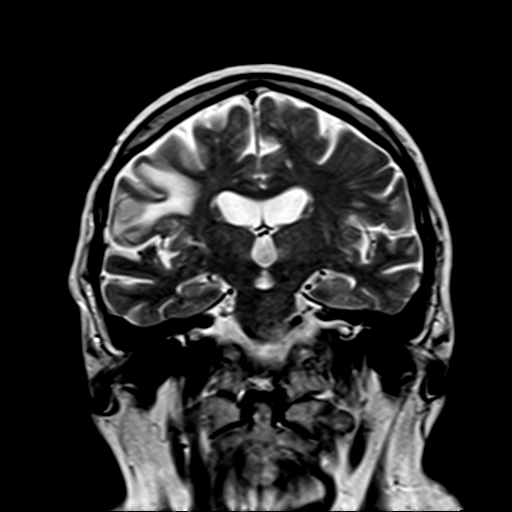
[im 31/31]
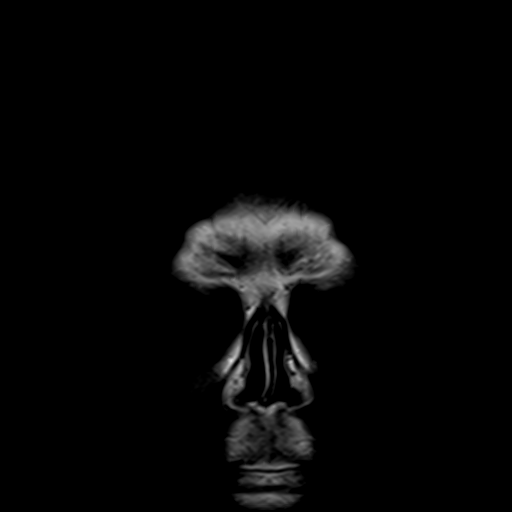

[Series 100: DWI · axial · 3.0mm · 1.20mm/px · z∈[-84,+74]mm · 5 of 55 slices shown (3 of 4)]
[im 1/55]
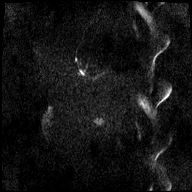
[im 14/55]
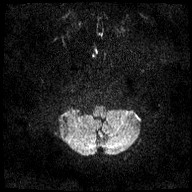
[im 28/55]
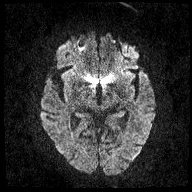
[im 41/55]
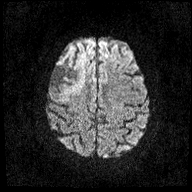
[im 55/55]
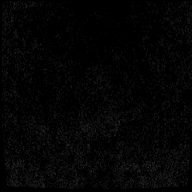

[Series 101: DWI · coronal · 3.0mm · 1.15mm/px · 4 of 45 slices shown (4 of 4)]
[im 1/45]
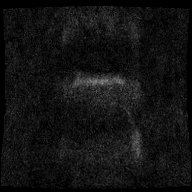
[im 15/45]
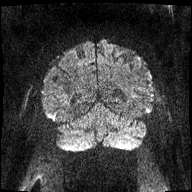
[im 30/45]
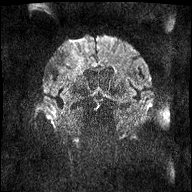
[im 45/45]
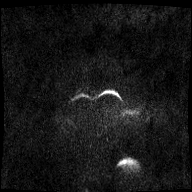

[48 of 48 positions shown; findings below may reference images not displayed]

FINDINGS: Brain: There is a moderate-sized area of encephalomalacia in the
anterior and central right MCA territory. Associated white matter
gliosis and mild hemosiderin. T2 shine through in some of the
affected white matter.

No restricted diffusion or evidence of acute infarction.

Scattered bilateral additional cerebral white matter T2 and FLAIR
hyperintensity, mostly subcortical. No other cortical
encephalomalacia or chronic cerebral blood products identified.

Mild to moderate for age T2 heterogeneity in the bilateral deep gray
matter nuclei and pons. Cerebellum remains within normal limits.

No midline shift, mass effect, evidence of mass lesion,
ventriculomegaly, extra-axial collection or acute intracranial
hemorrhage. Cervicomedullary junction and pituitary are within
normal limits.

Vascular: Major intracranial vascular flow voids are preserved, the
left vertebral artery appears dominant and dolichoectatic.

Skull and upper cervical spine: Previous right frontotemporal
craniotomy. Visualized bone marrow signal is within normal limits.
Negative visible cervical spine.

Sinuses/Orbits: Negative orbits and paranasal sinuses.

Other: Mastoids are clear. Grossly normal visible internal auditory
structures. Scalp and face soft tissues are negative.
IMPRESSION: 1. Chronic encephalomalacia in the anterior and central right MCA
territory with overlying craniotomy.
Moderate superimposed bilateral cerebral white matter and
mild-to-moderate deep gray matter and pontine signal changes
suggesting chronic small vessel disease.

2.  No acute intracranial abnormality identified.

## 2020-11-03 ENCOUNTER — Other Ambulatory Visit: Payer: Self-pay | Admitting: Family Medicine

## 2020-11-09 ENCOUNTER — Other Ambulatory Visit: Payer: Self-pay

## 2020-11-09 ENCOUNTER — Ambulatory Visit: Payer: Medicare HMO

## 2020-11-09 DIAGNOSIS — I10 Essential (primary) hypertension: Secondary | ICD-10-CM

## 2020-11-09 DIAGNOSIS — E118 Type 2 diabetes mellitus with unspecified complications: Secondary | ICD-10-CM

## 2020-11-09 NOTE — Progress Notes (Signed)
I have collaborated with the care management provider regarding care management and care coordination activities outlined in this encounter and have reviewed this encounter including documentation in the note and care plan. I am certifying that I agree with the content of this note and encounter as supervising physician.  

## 2020-11-09 NOTE — Chronic Care Management (AMB) (Signed)
Chronic Care Management Pharmacy  Name: Thomas Carlson  MRN: [id number] DOB: July 13, 1946  Chief Complaint/ HPI  Thomas Carlson,  75 y.o., male presents for their Follow-Up CCM visit with the clinical pharmacist via telephone. PCP : Ria Bush, MD  Their chronic conditions include: HTN, CAD, OSA, T2DM, CKD, dyslipidemia, BPH  Patient concerns: denies health concerns, reports diarrhea has resolved   Office Visits:  09/27/20: PCP visit - Uric acid elevated, increase allopurinol to 200 mg daily   03/28/20: PCP visit - HTN, stop chlorthalidone and potassium, DM, trial off metformin, pt lost 50 lbs in past 2 years, gout, was off allopurinol for 3 weeks now back on, continue aspirin, statin  09/03/19: PCP visit - DM follow up  Consult Visit:  12/02/19: [name] - take imodium OTC with lomotil 1 of each, 30 minutes before each meal and bedtime. May increase to 2 imodium and 1 lomotil QID.   Allergies  Allergen Reactions  . Gabapentin Other (See Comments)    unknown  . Bactrim [Sulfamethoxazole-Trimethoprim] Rash    Diffuse drug reaction - maculopapular rash  . Penicillins Hives    Has patient had a PCN reaction causing immediate rash, facial/tongue/throat swelling, SOB or lightheadedness with hypotension: Unknown Has patient had a PCN reaction causing severe rash involving mucus membranes or skin necrosis: Unknown Has patient had a PCN reaction that required hospitalization: Unknown Has patient had a PCN reaction occurring within the last 10 years: Unknown If all of the above answers are "NO", then may proceed with Cephalosporin use.   . Lasix [Furosemide] Other (See Comments)    Drops blood pressure and drained potassium and magnesium   Medications: Outpatient Encounter Medications as of 11/09/2020  Medication Sig  . acetaminophen (TYLENOL) 500 MG tablet Take 1,000 mg by mouth every 4 (four) hours as needed for fever or pain. Not to exceed 4000 mg daily  . acyclovir  (ZOVIRAX) 400 MG tablet TAKE 1 TABLET (400 MG TOTAL) BY MOUTH DAILY.  Marland Kitchen allopurinol (ZYLOPRIM) 100 MG tablet Take 2 tablets (200 mg total) by mouth daily.  Marland Kitchen aspirin EC 81 MG tablet Take 1 tablet (81 mg total) by mouth daily.  Marland Kitchen atenolol (TENORMIN) 25 MG tablet Take 1 tablet (25 mg total) by mouth daily.  . Biotin 1000 MCG tablet Take 1,000 mcg by mouth daily.   . Cyanocobalamin (B-12) 1000 MCG SUBL Place 1 tablet under the tongue daily.  Marland Kitchen lovastatin (MEVACOR) 40 MG tablet Take 2 tablets (80 mg total) by mouth at bedtime.  . Magnesium 500 MG TABS Take 500 mg by mouth daily.   No facility-administered encounter medications on file as of 11/09/2020.   Current Diagnosis/Assessment: Emergency planning/management officer Strain: Low Risk   . Difficulty of Paying Living Expenses: Not hard at all   Goals    . Patient Stated     Starting 03/01/19, I will continue to take medications as prescribed.     . Pharmacy Care Plan     CARE PLAN ENTRY  Current Barriers:  . Chronic Disease Management support, education, and care coordination needs related to Hypertension and Diabetes   Hypertension BP Readings from Last 3 Encounters:  05/10/20 133/71  03/28/20 108/60  09/13/19 137/85 .  Pharmacist Clinical Goal(s): o Over the next 3 months, patient will work with PharmD and providers to maintain BP goal <140/90 mmHg . Current regimen:  o Atenolol 25 mg - 1 tablet daily . Interventions: o With history of diabetes and CKD, we discussed  evaluation of proteinuria and considering switching atenolol to an ACE-inhibitor. Patient was on quinapril for a long time in the past. Due to weight loss of 50+ lbs he has been taken off all BP meds except atenolol.  . Patient self care activities - Over the next 3 months, patient will: o Check BP weekly, document, and provide at future appointment with PharmD o May discuss blood pressure medication choices with Dr. Danise Mina at follow up visit  o Ensure daily salt intake < 2300  mg/day  Diabetes Lab Results  Component Value Date/Time   HGBA1C 5.7 03/21/2020 10:46 AM   HGBA1C 8.6 (H) 08/24/2019 03:30 PM .  Pharmacist Clinical Goal(s): o Over the next 3 months, patient will work with PharmD and providers to maintain A1c goal <7% . Current regimen:  o Metformin 500 mg - 1 tablet daily . Interventions: o Per PCP note, patient may discontinue metformin due to excellent A1c  o Patient wants to continue Metformin 500 mg daily until current supply is exhausted.  . Patient self care activities - Over the next 3 months, patient will: o Discontinue metformin once current supply exhausted. o Exercise with goal of 30 minutes, 5 days per week o Incorporate a healthy diet high in vegetables, fruits and whole grains with low-fat dairy products, chicken, fish, legumes, non-tropical vegetable oils and nuts. Limit intake of sweets, sugar-sweetened beverages and red meats.  Please see past updates related to this goal by clicking on the "Past Updates" button in the selected goal        Hypertension   CMP Latest Ref Rng & Units 05/25/2020 03/21/2020 09/27/2019  Glucose 70 - 99 mg/dL 95 106(H) 105(H)  BUN 6 - 23 mg/dL 27(H) 32(H) 15  Creatinine 0.40 - 1.50 mg/dL 1.48 1.51(H) 1.31(H)  Sodium 135 - 145 mEq/L 139 140 141  Potassium 3.5 - 5.1 mEq/L 4.0 4.2 3.3(L)  Chloride 96 - 112 mEq/L 105 105 100  CO2 19 - 32 mEq/L 27 28 23   Calcium 8.4 - 10.5 mg/dL 9.4 9.5 9.0  Total Protein 6.0 - 8.3 g/dL - 6.0 -  Total Bilirubin 0.2 - 1.2 mg/dL - 0.6 -  Alkaline Phos 39 - 117 U/L - 64 -  AST 0 - 37 U/L - 13 -  ALT 0 - 53 U/L - 9 -   Lab Results  Component Value Date   CREATININE 1.48 05/25/2020   BUN 27 (H) 05/25/2020   GFR 46.33 (L) 05/25/2020   GFRNONAA 53 (L) 09/27/2019   GFRAA 62 09/27/2019   NA 139 05/25/2020   K 4.0 05/25/2020   CALCIUM 9.4 05/25/2020   CO2 27 05/25/2020   Office blood pressures are: BP Readings from Last 3 Encounters:  09/27/20 124/62  05/10/20  133/71  03/28/20 108/60   CPAP: never had one, denies knowledge of history of sleep apnea  Patient has failed these meds in the past: atenolol-chlorthalidone --> stopped 03/28/20 along with potassium, quinapril stopped due 10/2018. Per chart due  to orthostatic hypotension. Patient checks BP at home: has wrist cuff  Patient home BP readings are ranging: none reported  Patient is currently controlled on the following medications:   Atenolol 25 mg - 1 tablet daily  Update 11/09/20: With history of diabetes and CKD, we discussed evaluation of proteinuria and considering switching atenolol to an ACE-inhibitor. Patient was on quinapril for a long time in the past. Unsure exact reason for d/c. Due to weight loss of 50+ lbs he has been taken  off all BP meds except atenolol.   Plan: Continue current medications; Recommend eval of proteinuria and consideration of ACE-I.  Hyperlipidemia   Lipid Panel     Component Value Date/Time   CHOL 143 03/21/2020 1046   CHOL 162 06/27/2011 0000   TRIG 187.0 (H) 03/21/2020 1046   TRIG 163 05/19/2014 0000   HDL 36.50 (L) 03/21/2020 1046   LDLCALC 69 03/21/2020 1046   LDLCALC 86 05/19/2014 0000   LDLDIRECT 69.0 03/02/2019 0919    The ASCVD Risk score (Goff DC Jr., et al., 2013) failed to calculate for the following reasons:   The patient has a prior MI or stroke diagnosis   CBC Latest Ref Rng & Units 03/21/2020 08/25/2019 08/24/2019  WBC 4.0 - 10.5 K/uL 5.6 9.9 11.5(H)  Hemoglobin 13.0 - 17.0 g/dL 13.3 11.4(L) 14.2  Hematocrit 39.0 - 52.0 % 39.3 32.6(L) 41.0  Platelets 150.0 - 400.0 K/uL 242.0 257 289   LDL goal < 70 Patient has failed these meds in past: none reported Patient is currently controlled on the following medications:  . Lovastatin 40 mg - 2 tablets (80 mg) daily . Aspirin 81 mg - 1 tablet daily  Update 11/09/20: LDL within goal. Statin refills timely. Aspirin appropriate for secondary prevention. CBC WNL.  Plan: Continue current  medications  Diabetes   Recent Relevant Labs: Lab Results  Component Value Date/Time   HGBA1C 5.5 09/27/2020 02:07 PM   HGBA1C 5.7 03/21/2020 10:46 AM   HGBA1C 8.6 (H) 08/24/2019 03:30 PM   MICROALBUR <0.7 03/21/2020 10:46 AM   MICROALBUR 3.3 (H) 03/02/2019 10:42 AM    Checking BG: infrequently  Patient has failed these meds in past: none  Patient is currently controlled on the following medications:   Metformin 500 mg - 1 tablet daily   Last diabetic eye exam:  Lab Results  Component Value Date/Time   HMDIABEYEEXA No Retinopathy 09/16/2018 12:00 AM    Last diabetic foot exam:  PCP 03/28/20  Update 11/09/20: Pt still finishing out current supply of metformin 1 tablet daily. Metformin no longer necessary considering A1c 5.5% due to weight loss.   Plan: Continue control with diet and exercise   Gout   Uric Acid 02/13/18 7.5 - allopurinol dose increased 09/2020 Patient has failed these meds in past: none reported Patient is currently controlled on the following medications:   Allopurinol 100 mg - 2 tablets (200 mg) daily  Update 11/09/20: denies any flares since allopurinol dose increase  Plan: Continue current medications   Vitamin D Deficiency   Vitamin D 03/21/20 43 Patient has failed these meds in past: none reported  Patient is currently controlled on the following medications:   No pharmacotherapy  We discussed: has not been on vitamin D 1000 IU daily for several months, stopped multivitamin as well; level has remained within normal limits  No changes/updates 11/09/20  Plan: Continue current medications  Misc Meds   Patient is currently on the following medications:   Vitamin B12 1000 mcg - 1 tablet daily   Magnesium 500 mg - 1 daily (noted low magnesium 09/27/19)  Biotin 1000 mcg - 1 tablet daily (hair loss)  Acyclovir 400 mg - 1 tablet daily (genital herpes)  Tylenol 500 mg - 1 tablet daily PRN back pain   No changes/updates 11/09/20  Plan:  Continue current medications   Vaccines   Reviewed and discussed patient's vaccination history.    Immunization History  Administered Date(s) Administered  . Fluad Quad(high Dose 65+) 08/12/2019  .  Influenza,inj,Quad PF,6+ Mos 10/10/2017, 07/15/2018  . Influenza-Unspecified 07/18/2015, 07/19/2020  . PFIZER SARS-COV-2 Vaccination 11/17/2019, 12/08/2019, 08/28/2020  . Pneumococcal Conjugate-13 08/09/2016  . Pneumococcal Polysaccharide-23 07/10/2012  . Td 10/28/2010  . Zoster 10/28/2009   Plan: Recommended patient receive Shingrix  Medication Management  Pharmacy/Benefits: [employer]/Mail order - autorefill, denies concerns   Adherence: refills timely  Affordability: denies concerns   CCM Follow Up: No scheduled CCM follow up as chronic conditions well controlled and medications well  managed. Patient may call with any concerns.   Debbora Dus, PharmD Clinical Pharmacist [facility] at [facility] [phone]

## 2020-11-09 NOTE — Patient Instructions (Signed)
Dear Thomas Carlson,  Below is a summary of the goals we discussed during our follow up appointment on November 09, 2020. Please contact me anytime with questions or concerns.   Visit Information  Goals Addressed            This Visit's Progress   . Pharmacy Care Plan       CARE PLAN ENTRY  Current Barriers:  . Chronic Disease Management support, education, and care coordination needs related to Hypertension and Diabetes   Hypertension BP Readings from Last 3 Encounters:  05/10/20 133/71  03/28/20 108/60  09/13/19 137/85 .  Pharmacist Clinical Goal(s): o Over the next 3 months, patient will work with PharmD and providers to maintain BP goal <140/90 mmHg . Current regimen:  o Atenolol 25 mg - 1 tablet daily . Interventions: o With history of diabetes and CKD, we discussed evaluation of proteinuria and considering switching atenolol to an ACE-inhibitor. Patient was on quinapril for a long time in the past. Due to weight loss of 50+ lbs he has been taken off all BP meds except atenolol.  . Patient self care activities - Over the next 3 months, patient will: o Check BP weekly, document, and provide at future appointment with PharmD o May discuss blood pressure medication choices with Dr. Danise Mina at follow up visit  o Ensure daily salt intake < 2300 mg/day  Diabetes Lab Results  Component Value Date/Time   HGBA1C 5.7 03/21/2020 10:46 AM   HGBA1C 8.6 (H) 08/24/2019 03:30 PM .  Pharmacist Clinical Goal(s): o Over the next 3 months, patient will work with PharmD and providers to maintain A1c goal <7% . Current regimen:  o Metformin 500 mg - 1 tablet daily . Interventions: o Per PCP note, patient may discontinue metformin due to excellent A1c  o Patient wants to continue Metformin 500 mg daily until current supply is exhausted.  . Patient self care activities - Over the next 3 months, patient will: o Discontinue metformin once current supply exhausted. o Exercise with goal  of 30 minutes, 5 days per week o Incorporate a healthy diet high in vegetables, fruits and whole grains with low-fat dairy products, chicken, fish, legumes, non-tropical vegetable oils and nuts. Limit intake of sweets, sugar-sweetened beverages and red meats.  Please see past updates related to this goal by clicking on the "Past Updates" button in the selected goal        The patient verbalized understanding of instructions, educational materials, and care plan provided today and agreed to receive a mailed copy of patient instructions, educational materials, and care plan.   No CCM follow up scheduled due to well controlled chronic conditions. Patient may contact pharmacist with any medication questions or concerns.  Debbora Dus, PharmD Clinical Pharmacist [facility] at [facility] [phone]

## 2020-11-22 DIAGNOSIS — H524 Presbyopia: Secondary | ICD-10-CM | POA: Diagnosis not present

## 2020-11-22 DIAGNOSIS — Z0100 Encounter for examination of eyes and vision without abnormal findings: Secondary | ICD-10-CM | POA: Diagnosis not present

## 2020-12-05 ENCOUNTER — Telehealth: Payer: Self-pay

## 2020-12-05 ENCOUNTER — Telehealth: Payer: Self-pay | Admitting: Family Medicine

## 2020-12-05 NOTE — Telephone Encounter (Signed)
Patient was calling to make a follow up appointment

## 2020-12-05 NOTE — Telephone Encounter (Signed)
Called [facility] and verified that they did receive our referral and they stated they have reached out to the patient and mailed a letter and that patient just needs to call them back. I spoke with patient and advised him of everything. Patient stated he did not receive anything to his knowledge. I provided their phone number and asked patient to call them back to schedule.  FYI to PCP

## 2020-12-05 NOTE — Telephone Encounter (Signed)
Pt never read mychart message from 10/02/2020.  His referral expires 12/11/2020.  plz call and notify below:   Good morning,  Thomas Carlson, I wanted to let you know that I have spoken with [facility] department in [place], and sent over your referral to them to review and schedule per their request.   If you have not heard from them by next week you can call them directly at [phone] and they should be able to go ahead and schedule your appointment as long as the referral was reviewed by then.  Have a good day! [facility] Referral Coordinator/RMA

## 2020-12-07 ENCOUNTER — Encounter: Payer: Self-pay | Admitting: Gastroenterology

## 2020-12-07 ENCOUNTER — Ambulatory Visit: Payer: Medicare HMO | Admitting: Gastroenterology

## 2020-12-07 ENCOUNTER — Other Ambulatory Visit: Payer: Self-pay

## 2020-12-07 VITALS — BP 160/76 | HR 68 | Ht 68.0 in | Wt 223.0 lb

## 2020-12-07 DIAGNOSIS — D126 Benign neoplasm of colon, unspecified: Secondary | ICD-10-CM

## 2020-12-07 DIAGNOSIS — K52832 Lymphocytic colitis: Secondary | ICD-10-CM

## 2020-12-07 NOTE — Progress Notes (Signed)
[name], MD [address]  [place]  [place], [place] [address]  Main: [phone]  Fax: [phone]    Gastroenterology Consultation  Referring Provider:     Ria Bush, MD Primary Care Physician:  Ria Bush, MD Primary Gastroenterologist:  Dr. [name] Reason for Consultation: Lymphocytic colitis        HPI:   Thomas Carlson is a 75 y.o. male referred by Dr. Ria Bush, MD  for consultation & management of symptomatic internal hemorrhoids.  Patient has history of stage III CKD, coronary artery disease who was admitted to [facility] on 08/24/1989 secondary to rectal bleeding, symptomatic anemia.  He was experiencing rectal bleeding for 2 weeks prior to presentation to the hospital.  Patient has history of lymphocytic colitis taking budesonide.  Patient reports painless hematochezia, hemoglobin on admission was 8.6, baseline 12.7 in 05/2018.  His potassium was 2.2 with AKI on CKD on admission.  Patient underwent upper endoscopy and colonoscopy.  EGD was unremarkable, colonoscopy revealed lymphocytic colitis again as well as large internal hemorrhoids.  He is here to discuss about hemorrhoid ligation.  Patient reports that his energy levels are significantly better, swelling of legs has significantly improved since discharge.  He is no longer experiencing rectal bleeding.  Follow-up visit 10/07/2018 He underwent first hemorrhoid ligation about 2 weeks ago.  Denies any rectal bleeding.  He continues to have watery diarrhea, 3-4 times daily, denies abdominal pain, nausea or vomiting.  He is taking budesonide 3 mg 3 pills with breakfast for microscopic colitis.  Otherwise, his hemoglobin has significantly improved since hospital discharge.  Follow-up visit 04/22/2019 He reports doing fairly well, underwent knee replacement, currently using cane instead of walker.  He has recuperated well.  He reports having 1-2 soft bowel movements daily.  He is done out  of budesonide.  He denies rectal bleeding.  He is also here to discuss about hemorrhoid ligation.  He denies any other GI complaints  Follow-up visit 08/18/2019 Patient is concerned about recurrence of diarrhea that has been ongoing for about 2 to 3 months.  He reports having approximately 8 episodes of nonbloody watery bowel movements during the day and 2 to 3 at night.  This has significantly impacted his quality of life, he had few episodes of incontinence as well.  He denies abdominal pain, bloating, nausea or vomiting, weight loss.  He acknowledges drinking 2-3 diet sodas daily.  He thinks budesonide is no longer working and is expensive.  He denies rectal bleeding  Follow-up visit 09/13/2019 Patient was admitted to [facility] due to worsening of diarrhea.  Stool studies were negative for infection including C. difficile.  Budesonide was restarted and he was discharged home on Bentyl and Imodium.  He continues to have 6-8 episodes of diarrhea during the day and 2-3 times at night.  He finished a course of Bentyl 4 times daily and did not seem to help.  He is waiting to refill budesonide and he thinks budesonide is not helping anymore.  He has been taking magnesium 500 mg daily for several months.  His PCP decrease Metformin to 500 mg twice daily as a potential trigger for worsening of diarrhea.  He is also on allopurinol. His weight has been stable.  He is drinking only water, eliminated carbonated beverages.  Follow-up visit 05/10/2020 Patient's diarrhea has resolved.  He followed up with [facility] for second opinion due to ongoing weight loss and chronic refractory diarrhea.  He was started on  EnteraGam for 1 month.  His diarrhea has resolved in a week after completion of treatment.  He is also regaining weight.  He does not have any GI concerns today.  He is currently not on Entocort  Follow-up visit 12/07/2020 Patient is here to discuss about colonoscopy because of history of tubular  adenoma of the colon.  He does not have any recurrence of diarrhea from underlying colitis.  He does notice scant amount of clear seepage per rectum intermittently and patient states that he is not overly concerned.  He had hemorrhoid ligation in the past.  He does not have any other GI concerns today.  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  NSAIDs: None  Antiplts/Anticoagulants/Anti thrombotics: None  GI Procedures: Colonoscopy by Dr. Allen Norris 10/2015 - The examined portion of the ileum was normal. Biopsied. - Diverticulosis in the sigmoid colon. - Non-bleeding internal hemorrhoids. - Random biopsies were obtained in the entire colon. Diagnosis 1. Colon, biopsy, terminal ileum - BENIGN SMALL BOWEL MUCOSA. NO VILLOUS ATROPHY, INFLAMMATION OR OTHER ABNORMALITIES PRESENT. 2. Colon, biopsy, random colon - BENIGN COLONIC MUCOSA WITH FEATURES CONSISTENT WITH MICROSCOPIC COLITIS, SEE COMMENT.  EGD 08/26/2018 - Duodenitis. - Normal second portion of the duodenum. - Non-bleeding erosive gastropathy. Biopsied. - Normal cardia, gastric fundus, gastric body and incisura. Biopsied. - Esophagogastric landmarks identified. - Normal gastroesophageal junction and esophagus.  Colonoscopy 08/26/2018 - Hemorrhoids found on perianal exam. - The examined portion of the ileum was normal. - Two 6 to 8 mm polyps in the transverse colon, removed with a hot snare. Resected and retrieved. - Severe diverticulosis in the sigmoid colon. There was no evidence of diverticular bleeding. - Non-bleeding external and internal hemorrhoids, with stigmata of recent bleeding, likely source of rectal Bleeding.  DIAGNOSIS:  A. STOMACH, RANDOM; COLD BIOPSY:  - ANTRAL MUCOSA WITH MILD REACTIVE GASTRITIS.  - UNREMARKABLE OXYNTIC MUCOSA.  - NEGATIVE FOR H. PYLORI, DYSPLASIA, AND MALIGNANCY.   B. COLON POLYP X2, TRANSVERSE; HOT SNARE:  - TUBULAR ADENOMA (MULTIPLE FRAGMENTS).  -  NEGATIVE FOR HIGH-GRADE DYSPLASIA AND MALIGNANCY.   Past Medical History:  Diagnosis Date  . Acute posthemorrhagic anemia   . Aortic valve sclerosis 08/13/2016   Sclerosis without stenosis by [place] (08/2016)  . CAD (coronary artery disease) 2005   s/p stent ([place])  . Cholelithiasis    by CT  . Chronic kidney disease, stage 3, mod decreased GFR (HCC)   . Diabetes mellitus ([place]) 2010  . Diverticulosis    by CT  . DJD (degenerative joint disease)    knee  . Genital herpes   . Gout   . Heart murmur    followed by PCP  . History of arterial ischemic stroke 03/08/2019   Remote anterior frontal and central MCA stroke by MRI 2020 [place])  . HLD (hyperlipidemia)   . HTN (hypertension)   . Hypocalcemia 09/03/2019  . Knee pain, right    "needs replacement"  . Neuropathy    feet  . Obesity   . Seizures ([place])    x1 - after craniotomy (1960s)  . Weakness of both legs    "since back surgery"    Past Surgical History:  Procedure Laterality Date  . APPENDECTOMY  1958  . CLOSED REDUCTION CLAVICLE FRACTURE  1955  . COLONOSCOPY  02/2008   int hemorrhoids, diverticula (Dr. Allen Norris)  . COLONOSCOPY N/A 08/26/2018   TA, rpt 3 yrs ([place], [place])  . COLONOSCOPY WITH PROPOFOL N/A 11/10/2015  diverticulosis, path with microscopic colitis [name], MD)  . CORONARY ANGIOPLASTY WITH STENT PLACEMENT  2005   stent 2005  . DECOMPRESSIVE LUMBAR LAMINECTOMY LEVEL 1  03/2018   herniated L4/5 disc R with free fargment over L4 s/p surgery L4 ([place])  . ESOPHAGOGASTRODUODENOSCOPY N/A 08/26/2018   reactive gastritis with benign biopsies ([place], [place])  . HERNIA REPAIR  1956   inguinal  . KNEE ARTHROSCOPY  2008   torn meniscus  . SUBDURAL HEMATOMA EVACUATION VIA CRANIOTOMY  1964   hit in helmet by baseball  . TOTAL KNEE ARTHROPLASTY Right 11/12/2018   Procedure: TOTAL KNEE ARTHROPLASTY;  Surgeon: Thornton Park, MD;  Location: [facility] ORS;  Service: Orthopedics;  Laterality: Right;     Current Outpatient Medications:  .  acetaminophen (TYLENOL) 500 MG tablet, Take 1,000 mg by mouth every 4 (four) hours as needed for fever or pain. Not to exceed 4000 mg daily, Disp: , Rfl:  .  acyclovir (ZOVIRAX) 400 MG tablet, TAKE 1 TABLET (400 MG TOTAL) BY MOUTH DAILY., Disp: 90 tablet, Rfl: 2 .  allopurinol (ZYLOPRIM) 100 MG tablet, Take 2 tablets (200 mg total) by mouth daily., Disp: 180 tablet, Rfl: 3 .  aspirin EC 81 MG tablet, Take 1 tablet (81 mg total) by mouth daily., Disp: , Rfl:  .  atenolol (TENORMIN) 25 MG tablet, Take 1 tablet (25 mg total) by mouth daily., Disp: 90 tablet, Rfl: 3 .  Biotin 1000 MCG tablet, Take 1,000 mcg by mouth daily. , Disp: , Rfl:  .  Cyanocobalamin (B-12) 1000 MCG SUBL, Place 1 tablet under the tongue daily., Disp: , Rfl:  .  lovastatin (MEVACOR) 40 MG tablet, Take 2 tablets (80 mg total) by mouth at bedtime., Disp: 60 tablet, Rfl: 0 .  Magnesium 500 MG TABS, Take 500 mg by mouth daily., Disp: , Rfl:    Family History  Problem Relation Age of Onset  . Diabetes Father 37  . Coronary artery disease Sister        catheterizations  . COPD Brother   . Stroke Brother   . Hypertension Brother   . Cancer Neg Hx      Social History   Tobacco Use  . Smoking status: Never Smoker  . Smokeless tobacco: Never Used  Vaping Use  . Vaping Use: Never used  Substance Use Topics  . Alcohol use: No    Alcohol/week: 0.0 standard drinks  . Drug use: No    Allergies as of 12/07/2020 - Review Complete 12/07/2020  Allergen Reaction Noted  . Gabapentin Other (See Comments) 08/19/2018  . Bactrim [sulfamethoxazole-trimethoprim] Rash 05/19/2013  . Penicillins Hives 06/18/2012  . Lasix [furosemide] Other (See Comments) 09/02/2018    Review of Systems:    All systems reviewed and negative except where noted in HPI.   Physical Exam:  BP (!) 160/76 (BP Location: Left Arm, Patient Position: Sitting, Cuff Size: Large)   Pulse 68   Ht 5\' 8"  (1.727 m)   Wt  223 lb (101.2 kg)   BMI 33.91 kg/m  No LMP for male patient.  General:   Alert,  Well-developed, well-nourished, pleasant and cooperative in NAD Head:  Normocephalic and atraumatic. Eyes:  Sclera clear, no icterus.   Conjunctiva pink. Ears:  Normal auditory acuity. Nose:  No deformity, discharge, or lesions. Mouth:  No deformity or lesions,oropharynx pink & moist. Neck:  Supple; no masses or thyromegaly. Lungs:  Respirations even and unlabored.  Clear throughout to auscultation.   No wheezes,  crackles, or rhonchi. No acute distress. Heart:  Regular rate and rhythm; no murmurs, clicks, rubs, or gallops. Abdomen:  Normal bowel sounds. Soft, obese, non-tender and non-distended without masses, hepatosplenomegaly or hernias noted.  No guarding or rebound tenderness.   Rectal: Nontender, large internal hemorrhoids Msk:  Symmetrical without gross deformities. Good, equal movement & strength bilaterally. Pulses:  Normal pulses noted. Extremities:  No clubbing, bilateral swelling of feet.  No cyanosis. Neurologic:  Alert and oriented x3;  grossly normal neurologically. Skin:  Intact without significant lesions or rashes. No jaundice. Psych:  Alert and cooperative. Normal mood and affect.  Imaging Studies: Reviewed  Assessment and Plan:   Thomas Carlson is a 75 y.o. Caucasian male with lymphocytic colitis, chronic kidney disease, coronary artery disease with rectal bleeding secondary to internal hemorrhoids resulting in anemia which is currently resolved, status post hemorrhoid ligation x3.  History of lymphocytic colitis that was refractory to Entocort.  Resolved after treating with Enteragam.   Multiple adenomas of the colon Recommend surveillance colonoscopy in 08/2021 Reassured patient that he will receive a call from our office or referral from his PCP to schedule colonoscopy in November 2022.  He is early to undergo at this time.  Patient expressed understanding of the plan   Follow  up as needed   [name], MD

## 2020-12-20 ENCOUNTER — Telehealth: Payer: Self-pay | Admitting: Family Medicine

## 2020-12-21 NOTE — Telephone Encounter (Signed)
error 

## 2020-12-25 DIAGNOSIS — D801 Nonfamilial hypogammaglobulinemia: Secondary | ICD-10-CM | POA: Diagnosis not present

## 2021-01-01 ENCOUNTER — Other Ambulatory Visit: Payer: Self-pay

## 2021-01-01 ENCOUNTER — Encounter: Payer: Self-pay | Admitting: Family Medicine

## 2021-01-01 ENCOUNTER — Ambulatory Visit
Admission: EM | Admit: 2021-01-01 | Discharge: 2021-01-01 | Disposition: A | Discharge status: Home / Self Care | Payer: Medicare HMO

## 2021-01-01 DIAGNOSIS — T161XXA Foreign body in right ear, initial encounter: Secondary | ICD-10-CM

## 2021-01-01 DIAGNOSIS — H6123 Impacted cerumen, bilateral: Secondary | ICD-10-CM | POA: Diagnosis not present

## 2021-01-01 NOTE — ED Triage Notes (Signed)
Pt presents today with c/o of foreign body in right ear since last night. He was using ear wax cleaner OTC when tip came off.

## 2021-01-01 NOTE — Discharge Instructions (Signed)
We removed the plastic tip from your ear and cleaned out the wax today.  Follow up as needed for continued or worsening symptoms

## 2021-01-01 NOTE — ED Provider Notes (Signed)
Thomas Carlson    CSN: [id number] Arrival date & time: 01/01/21  0802      History   Chief Complaint Chief Complaint  Patient presents with  . Foreign Body in [place]    right    HPI Thomas Carlson is a 75 y.o. male.   Pt is a 74 year old male that presents with  foreign body in right ear since last night. He was using ear wax cleaner OTC when tip came off. No pain but trouble hearing.      Past Medical History:  Diagnosis Date  . Acute posthemorrhagic anemia   . Aortic valve sclerosis 08/13/2016   Sclerosis without stenosis by [place] (08/2016)  . CAD (coronary artery disease) 2005   s/p stent ([place])  . Cholelithiasis    by CT  . Chronic kidney disease, stage 3, mod decreased GFR (HCC)   . Diabetes mellitus ([place]) 2010  . Diverticulosis    by CT  . DJD (degenerative joint disease)    knee  . Genital herpes   . Gout   . Heart murmur    followed by PCP  . History of arterial ischemic stroke 03/08/2019   Remote anterior frontal and central MCA stroke by MRI 2020 Thomas Carlson)  . HLD (hyperlipidemia)   . HTN (hypertension)   . Hypocalcemia 09/03/2019  . Knee pain, right    "needs replacement"  . Neuropathy    feet  . Obesity   . Seizures ([place])    x1 - after craniotomy (1960s)  . Weakness of both legs    "since back surgery"    Patient Active Problem List   Diagnosis Date Noted  . Trigger finger, right ring finger 09/28/2020  . Immunoglobulin deficiency ([place]) 03/28/2020  . History of cerebrovascular accident (CVA) in adulthood 03/08/2019  . Ataxia 12/17/2018  . 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 BPH without obstruction/lower urinary tract symptoms 11/25/2018  . Genital herpes 11/25/2018  . Osteoarthritis of right knee 11/18/2018  . Total knee replacement status, right 11/12/2018  . Peripheral neuropathy 09/02/2018  . Degeneration of lumbar intervertebral disc 08/17/2018  . Obstructive sleep apnea syndrome 08/17/2018  . Edema of both feet  07/28/2018  . Weakness of both lower extremities 07/16/2018  . Lymphocytic colitis 08/13/2016  . Aortic valve sclerosis 08/13/2016  . Hearing loss of left ear due to cerumen impaction 08/13/2016  . Diverticulosis of large intestine without diverticulitis   . Medicare annual wellness visit, subsequent 07/13/2015  . Advanced care planning/counseling discussion 07/13/2015  . Lumbar disc herniation with radiculopathy 05/26/2014  . Vitamin D deficiency 07/14/2013  . Healthcare maintenance 07/10/2012  . Chronic kidney disease, stage 3a ([place])   . DJD (degenerative joint disease)   . History of diabetes mellitus, type II   . Hypertension   . Dyslipidemia   . CAD (coronary artery disease)   . Obesity, Class I, BMI 30-34.9     Past Surgical History:  Procedure Laterality Date  . APPENDECTOMY  1958  . CLOSED REDUCTION CLAVICLE FRACTURE  1955  . COLONOSCOPY  02/2008   int hemorrhoids, diverticula (Dr. Allen Carlson)  . COLONOSCOPY N/A 08/26/2018   TA, rpt 3 yrs (Thomas Carlson, Thomas Carlson)  . COLONOSCOPY WITH PROPOFOL N/A 11/10/2015   diverticulosis, path with microscopic colitis Thomas Lame, MD)  . CORONARY ANGIOPLASTY WITH STENT PLACEMENT  2005   stent 2005  . DECOMPRESSIVE LUMBAR LAMINECTOMY LEVEL 1  03/2018   herniated L4/5 disc R with  free fargment over L4 s/p surgery L4 (Thomas Carlson)  . ESOPHAGOGASTRODUODENOSCOPY N/A 08/26/2018   reactive gastritis with benign biopsies (Thomas Carlson, Thomas Carlson)  . HERNIA REPAIR  1956   inguinal  . KNEE ARTHROSCOPY  2008   torn meniscus  . SUBDURAL HEMATOMA EVACUATION VIA CRANIOTOMY  1964   hit in helmet by baseball  . TOTAL KNEE ARTHROPLASTY Right 11/12/2018   Procedure: TOTAL KNEE ARTHROPLASTY;  Surgeon: Thomas Park, MD;  Location: [facility] ORS;  Service: Orthopedics;  Laterality: Right;       Home Medications    Prior to Admission medications   Medication Sig Start Date End Date Taking? Authorizing Provider  acetaminophen (TYLENOL) 500 MG tablet Take 1,000  mg by mouth every 4 (four) hours as needed for fever or pain. Not to exceed 4000 mg daily 11/24/18  Yes [provider]  acyclovir (ZOVIRAX) 400 MG tablet TAKE 1 TABLET (400 MG TOTAL) BY MOUTH DAILY. 11/03/20  Yes Ria Bush, MD  allopurinol (ZYLOPRIM) 100 MG tablet Take 2 tablets (200 mg total) by mouth daily. 09/27/20  Yes Ria Bush, MD  aspirin EC 81 MG tablet Take 1 tablet (81 mg total) by mouth daily. 03/08/19  Yes Ria Bush, MD  atenolol (TENORMIN) 25 MG tablet Take 1 tablet (25 mg total) by mouth daily. 03/28/20  Yes Ria Bush, MD  Biotin 1000 MCG tablet Take 1,000 mcg by mouth daily.    Yes [provider]  Cyanocobalamin (B-12) 1000 MCG SUBL Place 1 tablet under the tongue daily. 10/15/19  Yes Ria Bush, MD  lovastatin (MEVACOR) 40 MG tablet Take 2 tablets (80 mg total) by mouth at bedtime. 03/30/20  Yes Ria Bush, MD  Magnesium 500 MG TABS Take 500 mg by mouth daily.   Yes [provider]    Family History Family History  Problem Relation Age of Onset  . Diabetes Father 78  . Coronary artery disease Sister        catheterizations  . COPD Brother   . Stroke Brother   . Hypertension Brother   . Cancer Neg Hx     Social History Social History   Tobacco Use  . Smoking status: Never Smoker  . Smokeless tobacco: Never Used  Vaping Use  . Vaping Use: Never used  Substance Use Topics  . Alcohol use: No    Alcohol/week: 0.0 standard drinks  . Drug use: No     Allergies   Gabapentin, Bactrim [sulfamethoxazole-trimethoprim], Penicillins, and Lasix [furosemide]   Review of Systems Review of Systems   Physical Exam Triage Vital Signs ED Triage Vitals  Enc Vitals Group     BP 01/01/21 0814 (!) 166/88     Pulse Rate 01/01/21 0814 (!) 57     Resp 01/01/21 0814 18     Temp 01/01/21 0814 97.9 F (36.6 C)     Temp Source 01/01/21 0814 Oral     SpO2 01/01/21 0814 97 %     Weight --      Height --       Head Circumference --      Peak Flow --      Pain Score 01/01/21 0816 0     Pain Loc --      Pain Edu? --      Excl. in [place]? --    No data found.  Updated Vital Signs BP (!) 166/88 (BP Location: Left Arm)   Pulse (!) 57   Temp 97.9 F (36.6 C) (Oral)  Resp 18   SpO2 97%   Visual Acuity Right Eye Distance:   Left Eye Distance:   Bilateral Distance:    Right Eye Near:   Left Eye Near:    Bilateral Near:     Physical Exam Vitals and nursing note reviewed.  Constitutional:      Appearance: Normal appearance.  HENT:     Head: Normocephalic and atraumatic.     Left Ear: There is impacted cerumen.     Ears:     Comments: White plastic tip removed from ear.  Eyes:     Conjunctiva/sclera: Conjunctivae normal.  Pulmonary:     Effort: Pulmonary effort is normal.  Musculoskeletal:        General: Normal range of motion.     Cervical back: Normal range of motion.  Skin:    General: Skin is warm and dry.  Neurological:     Mental Status: He is alert.  Psychiatric:        Mood and Affect: Mood normal.      UC Treatments / Results  Labs (all labs ordered are listed, but only abnormal results are displayed) Labs Reviewed - No data to display  EKG   Radiology No results found.  Procedures Procedures (including critical care time)  Medications Ordered in UC Medications - No data to display  Initial Impression / Assessment and Plan / UC Course  I have reviewed the triage vital signs and the nursing notes.  Pertinent labs & imaging results that were available during my care of the patient were reviewed by me and considered in my medical decision making (see chart for details).     Cerumen impaction Ear wax removed here today with ear lavage.   foreign body.  Removed here today successfully.  Pt tolerated well.  Follow up as needed for continued or worsening symptoms  Final Clinical Impressions(s) / UC Diagnoses   Final diagnoses:  Bilateral impacted  cerumen  Foreign body of right ear, initial encounter     Discharge Instructions     We removed the plastic tip from your ear and cleaned out the wax today.  Follow up as needed for continued or worsening symptoms     ED Prescriptions    None     PDMP not reviewed this encounter.   Loura Halt A, NP 01/01/21 [phone]

## 2021-01-06 ENCOUNTER — Other Ambulatory Visit: Payer: Self-pay | Admitting: Family Medicine

## 2021-01-08 NOTE — Telephone Encounter (Signed)
Pharmacy requests refill on: Atenolol 25 mg   LAST REFILL: 03/28/2020 (Q-90, R-3) LAST OV: 09/27/2020 NEXT OV: 03/28/2020 PHARMACY: [facility]

## 2021-01-19 ENCOUNTER — Telehealth: Payer: Self-pay

## 2021-01-19 NOTE — Telephone Encounter (Signed)
Don't think he needs another pneumovax at this time, could consider after 07/10/2022 (10 yrs from last).  If he is found to have immunodeficiency, that would change recommendation and he should be revaccinated every 5-10 yrs instead of every 10 yrs.

## 2021-01-19 NOTE — Telephone Encounter (Signed)
Pt left v/m that he had gotten referral to [facility] and pt went for visit; [facility] wants to know if pt has had pneumovax 23. Per pts immunization list pt had pneumovax 23 on 07/10/2012 and pneumococcal conjugate 13 on 08/09/16. Does pt need another pneumovax 23.Please advise.

## 2021-01-22 NOTE — Telephone Encounter (Signed)
Left a message on voicemail for patient to call back.

## 2021-01-23 NOTE — Telephone Encounter (Signed)
Spoke with pt relaying Dr. G's message. Pt verbalizes understanding.  

## 2021-02-01 ENCOUNTER — Other Ambulatory Visit: Payer: Self-pay | Admitting: Family Medicine

## 2021-02-28 ENCOUNTER — Telehealth: Payer: Self-pay | Admitting: *Deleted

## 2021-02-28 DIAGNOSIS — N1831 Chronic kidney disease, stage 3a: Secondary | ICD-10-CM

## 2021-02-28 MED ORDER — ATENOLOL-CHLORTHALIDONE 50-25 MG PO TABS: 1.0000 | ORAL_TABLET | Freq: Every day | ORAL | 3 refills | Status: DC

## 2021-02-28 NOTE — Telephone Encounter (Signed)
Ok to go back on atenolol/chlorthalidone combination - sent to mail order. However I'd like him to schedule lab visit to check kidneys and potassium as previously we went off chlorthalidone due to Cr increasing. Also will need to monitor blood pressure and heart rate, ensure neither drops too low.  Also chlorthalidone may increase chance of gout flare.  Labs ordered.

## 2021-02-28 NOTE — Telephone Encounter (Signed)
Patient called stating that he was at one time on Atenolol- Chlothalidone but was changed to just the Atenolol. Patient stated that he checked his blood pressure about 3 weeks ago and his blood pressure started going back up. Patient stated that his systolic started running 409 and a little higher. Patient was not able to give me any other numbers when it was running high. Patient stated that he stopped taking the plain Atenolol and went back to the combination medication about 2 weeks ago. Patient stated that his blood pressure is now doing a lot better. Patient stated that his blood pressure has been running around 120-130/70. Patient stated that if it is okay for him to stay on the combination pill he needs a script sent to [facility] for a 90 day supply.

## 2021-03-01 NOTE — Telephone Encounter (Signed)
Patient called and did not answer LVM for him to call back for Dr. Bosie Clos recommendations.

## 2021-03-01 NOTE — Telephone Encounter (Signed)
Patient called back and was informed about the recommendations from Dr. Danise Mina. Patient was informed that his medications were sent in through mail order and to set up an appointment to do his labs when he gets a chance. Patient stated that his understanding and appreciation for the call.

## 2021-03-21 ENCOUNTER — Other Ambulatory Visit: Payer: Medicare HMO

## 2021-03-21 ENCOUNTER — Ambulatory Visit: Payer: Medicare HMO

## 2021-03-28 ENCOUNTER — Encounter: Payer: Medicare HMO | Admitting: Family Medicine

## 2021-04-16 ENCOUNTER — Telehealth: Payer: Self-pay | Admitting: Family Medicine

## 2021-04-17 NOTE — Telephone Encounter (Signed)
E-scribed refill.  Plz r/s wellness, lab and cpe visits.

## 2021-04-18 NOTE — Telephone Encounter (Signed)
Noted  

## 2021-04-18 NOTE — Telephone Encounter (Signed)
Patient is scheduled EM 

## 2021-05-22 DIAGNOSIS — L570 Actinic keratosis: Secondary | ICD-10-CM | POA: Diagnosis not present

## 2021-05-22 DIAGNOSIS — I8393 Asymptomatic varicose veins of bilateral lower extremities: Secondary | ICD-10-CM | POA: Diagnosis not present

## 2021-05-22 DIAGNOSIS — L84 Corns and callosities: Secondary | ICD-10-CM | POA: Diagnosis not present

## 2021-05-22 DIAGNOSIS — D1801 Hemangioma of skin and subcutaneous tissue: Secondary | ICD-10-CM | POA: Diagnosis not present

## 2021-05-22 DIAGNOSIS — L819 Disorder of pigmentation, unspecified: Secondary | ICD-10-CM | POA: Diagnosis not present

## 2021-05-22 DIAGNOSIS — L821 Other seborrheic keratosis: Secondary | ICD-10-CM | POA: Diagnosis not present

## 2021-05-22 DIAGNOSIS — L814 Other melanin hyperpigmentation: Secondary | ICD-10-CM | POA: Diagnosis not present

## 2021-05-31 ENCOUNTER — Other Ambulatory Visit: Payer: Self-pay | Admitting: Family Medicine

## 2021-06-24 ENCOUNTER — Other Ambulatory Visit: Payer: Self-pay | Admitting: Family Medicine

## 2021-06-24 DIAGNOSIS — Z8639 Personal history of other endocrine, nutritional and metabolic disease: Secondary | ICD-10-CM

## 2021-06-24 DIAGNOSIS — Z125 Encounter for screening for malignant neoplasm of prostate: Secondary | ICD-10-CM

## 2021-06-24 DIAGNOSIS — E559 Vitamin D deficiency, unspecified: Secondary | ICD-10-CM

## 2021-06-24 DIAGNOSIS — E785 Hyperlipidemia, unspecified: Secondary | ICD-10-CM

## 2021-06-24 DIAGNOSIS — N1831 Chronic kidney disease, stage 3a: Secondary | ICD-10-CM

## 2021-06-24 DIAGNOSIS — M1A29X0 Drug-induced chronic gout, multiple sites, without tophus (tophi): Secondary | ICD-10-CM

## 2021-06-25 ENCOUNTER — Telehealth (INDEPENDENT_AMBULATORY_CARE_PROVIDER_SITE_OTHER): Payer: Medicare HMO | Admitting: Gastroenterology

## 2021-06-25 DIAGNOSIS — Z86010 Personal history of colonic polyps: Secondary | ICD-10-CM

## 2021-06-25 MED ORDER — PEG 3350-KCL-NA BICARB-NACL 420 G PO SOLR: 4000.0000 mL | Freq: Once | ORAL | 0 refills | Status: AC

## 2021-06-25 NOTE — Progress Notes (Signed)
Gastroenterology Pre-Procedure Review  Request Date: 08/21/21 Requesting Physician: Dr. Marius Ditch  PATIENT REVIEW QUESTIONS: The patient responded to the following health history questions as indicated:    1. Are you having any GI issues? no 2. Do you have a personal history of Polyps? yes (08/26/2018) 3. Do you have a family history of Colon Cancer or Polyps? no 4. Diabetes Mellitus? no 5. Joint replacements in the past 12 months?no 6. Major health problems in the past 3 months?no 7. Any artificial heart valves, MVP, or defibrillator?no    MEDICATIONS & ALLERGIES:    Patient reports the following regarding taking any anticoagulation/antiplatelet therapy:   Plavix, Coumadin, Eliquis, Xarelto, Lovenox, Pradaxa, Brilinta, or Effient? no Aspirin? yes (81 mg)  Patient confirms/reports the following medications:  Current Outpatient Medications  Medication Sig Dispense Refill   acetaminophen (TYLENOL) 500 MG tablet Take 1,000 mg by mouth every 4 (four) hours as needed for fever or pain. Not to exceed 4000 mg daily     acyclovir (ZOVIRAX) 400 MG tablet TAKE 1 TABLET (400 MG TOTAL) BY MOUTH DAILY. 90 tablet 2   allopurinol (ZYLOPRIM) 100 MG tablet TAKE 2 TABLETS EVERY DAY 180 tablet 0   aspirin EC 81 MG tablet Take 1 tablet (81 mg total) by mouth daily.     atenolol-chlorthalidone (TENORETIC) 50-25 MG tablet Take 1 tablet by mouth daily. 90 tablet 3   Biotin 1000 MCG tablet Take 1,000 mcg by mouth daily.      Cyanocobalamin (B-12) 1000 MCG SUBL Place 1 tablet under the tongue daily.     lovastatin (MEVACOR) 40 MG tablet TAKE 2 TABLETS (80 MG TOTAL) BY MOUTH AT BEDTIME. 180 tablet 0   Magnesium 500 MG TABS Take 500 mg by mouth daily.     No current facility-administered medications for this visit.    Patient confirms/reports the following allergies:  Allergies  Allergen Reactions   Gabapentin Other (See Comments)    unknown   Bactrim [Sulfamethoxazole-Trimethoprim] Rash    Diffuse drug  reaction - maculopapular rash   Penicillins Hives    Has patient had a PCN reaction causing immediate rash, facial/tongue/throat swelling, SOB or lightheadedness with hypotension: Unknown Has patient had a PCN reaction causing severe rash involving mucus membranes or skin necrosis: Unknown Has patient had a PCN reaction that required hospitalization: Unknown Has patient had a PCN reaction occurring within the last 10 years: Unknown If all of the above answers are "NO", then may proceed with Cephalosporin use.    Lasix [Furosemide] Other (See Comments)    Drops blood pressure and drained potassium and magnesium    No orders of the defined types were placed in this encounter.   AUTHORIZATION INFORMATION Primary Insurance: 1D#: Group #:  Secondary Insurance: 1D#: Group #:  SCHEDULE INFORMATION: Date: 08/21/21 Time: Location: [facility]

## 2021-06-27 ENCOUNTER — Ambulatory Visit: Payer: Medicare HMO

## 2021-06-28 ENCOUNTER — Other Ambulatory Visit: Payer: Self-pay

## 2021-06-28 ENCOUNTER — Other Ambulatory Visit (INDEPENDENT_AMBULATORY_CARE_PROVIDER_SITE_OTHER): Payer: Medicare HMO

## 2021-06-28 DIAGNOSIS — E559 Vitamin D deficiency, unspecified: Secondary | ICD-10-CM

## 2021-06-28 DIAGNOSIS — N1831 Chronic kidney disease, stage 3a: Secondary | ICD-10-CM | POA: Diagnosis not present

## 2021-06-28 DIAGNOSIS — M1A29X0 Drug-induced chronic gout, multiple sites, without tophus (tophi): Secondary | ICD-10-CM

## 2021-06-28 DIAGNOSIS — Z8639 Personal history of other endocrine, nutritional and metabolic disease: Secondary | ICD-10-CM

## 2021-06-28 DIAGNOSIS — E785 Hyperlipidemia, unspecified: Secondary | ICD-10-CM | POA: Diagnosis not present

## 2021-06-28 DIAGNOSIS — Z125 Encounter for screening for malignant neoplasm of prostate: Secondary | ICD-10-CM

## 2021-06-28 LAB — CBC WITH DIFFERENTIAL/PLATELET
Basophils Absolute: 0 10*3/uL (ref 0.0–0.1)
Basophils Relative: 0.7 % (ref 0.0–3.0)
Eosinophils Absolute: 0.3 10*3/uL (ref 0.0–0.7)
Eosinophils Relative: 4.1 % (ref 0.0–5.0)
HCT: 43.6 % (ref 39.0–52.0)
Hemoglobin: 14.5 g/dL (ref 13.0–17.0)
Lymphocytes Relative: 30.1 % (ref 12.0–46.0)
Lymphs Abs: 1.9 10*3/uL (ref 0.7–4.0)
MCHC: 33.3 g/dL (ref 30.0–36.0)
MCV: 95.8 fl (ref 78.0–100.0)
Monocytes Absolute: 0.8 10*3/uL (ref 0.1–1.0)
Monocytes Relative: 12.2 % — ABNORMAL HIGH (ref 3.0–12.0)
Neutro Abs: 3.4 10*3/uL (ref 1.4–7.7)
Neutrophils Relative %: 52.9 % (ref 43.0–77.0)
Platelets: 215 10*3/uL (ref 150.0–400.0)
RBC: 4.55 Mil/uL (ref 4.22–5.81)
RDW: 15.7 % — ABNORMAL HIGH (ref 11.5–15.5)
WBC: 6.4 10*3/uL (ref 4.0–10.5)

## 2021-06-28 LAB — COMPREHENSIVE METABOLIC PANEL
ALT: 16 U/L (ref 0–53)
AST: 19 U/L (ref 0–37)
Albumin: 4.2 g/dL (ref 3.5–5.2)
Alkaline Phosphatase: 67 U/L (ref 39–117)
BUN: 28 mg/dL — ABNORMAL HIGH (ref 6–23)
CO2: 30 mEq/L (ref 19–32)
Calcium: 9.4 mg/dL (ref 8.4–10.5)
Chloride: 103 mEq/L (ref 96–112)
Creatinine, Ser: 1.52 mg/dL — ABNORMAL HIGH (ref 0.40–1.50)
GFR: 44.36 mL/min — ABNORMAL LOW (ref >60.00)
Glucose, Bld: 122 mg/dL — ABNORMAL HIGH (ref 70–99)
Potassium: 4 mEq/L (ref 3.5–5.1)
Sodium: 141 mEq/L (ref 135–145)
Total Bilirubin: 0.5 mg/dL (ref 0.2–1.2)
Total Protein: 6.3 g/dL (ref 6.0–8.3)

## 2021-06-28 LAB — LIPID PANEL
Cholesterol: 142 mg/dL (ref 0–200)
HDL: 37.9 mg/dL — ABNORMAL LOW (ref >39.00)
LDL Cholesterol: 66 mg/dL (ref 0–99)
NonHDL: 103.73
Total CHOL/HDL Ratio: 4
Triglycerides: 190 mg/dL — ABNORMAL HIGH (ref 0.0–149.0)
VLDL: 38 mg/dL (ref 0.0–40.0)

## 2021-06-28 LAB — PSA: PSA: 0.12 ng/mL (ref 0.10–4.00)

## 2021-06-28 LAB — URIC ACID: Uric Acid, Serum: 7.4 mg/dL (ref 4.0–7.8)

## 2021-06-28 LAB — VITAMIN D 25 HYDROXY (VIT D DEFICIENCY, FRACTURES): VITD: 36.72 ng/mL (ref 30.00–100.00)

## 2021-06-28 LAB — HEMOGLOBIN A1C: Hgb A1c MFr Bld: 6.6 % — ABNORMAL HIGH (ref 4.6–6.5)

## 2021-06-28 LAB — MICROALBUMIN / CREATININE URINE RATIO
Creatinine,U: 139.1 mg/dL
Microalb Creat Ratio: 0.6 mg/g (ref 0.0–30.0)
Microalb, Ur: 0.8 mg/dL (ref 0.0–1.9)

## 2021-07-04 ENCOUNTER — Other Ambulatory Visit: Payer: Self-pay

## 2021-07-04 ENCOUNTER — Encounter: Payer: Self-pay | Admitting: Family Medicine

## 2021-07-04 ENCOUNTER — Ambulatory Visit (INDEPENDENT_AMBULATORY_CARE_PROVIDER_SITE_OTHER): Payer: Medicare HMO | Admitting: Family Medicine

## 2021-07-04 VITALS — BP 128/70 | HR 55 | Temp 98.0°F | Ht 68.25 in | Wt 224.5 lb

## 2021-07-04 DIAGNOSIS — Z23 Encounter for immunization: Secondary | ICD-10-CM

## 2021-07-04 DIAGNOSIS — E559 Vitamin D deficiency, unspecified: Secondary | ICD-10-CM

## 2021-07-04 DIAGNOSIS — Z8673 Personal history of transient ischemic attack (TIA), and cerebral infarction without residual deficits: Secondary | ICD-10-CM

## 2021-07-04 DIAGNOSIS — E1122 Type 2 diabetes mellitus with diabetic chronic kidney disease: Secondary | ICD-10-CM

## 2021-07-04 DIAGNOSIS — E669 Obesity, unspecified: Secondary | ICD-10-CM

## 2021-07-04 DIAGNOSIS — A6002 Herpesviral infection of other male genital organs: Secondary | ICD-10-CM

## 2021-07-04 DIAGNOSIS — N183 Chronic kidney disease, stage 3 unspecified: Secondary | ICD-10-CM | POA: Diagnosis not present

## 2021-07-04 DIAGNOSIS — D809 Immunodeficiency with predominantly antibody defects, unspecified: Secondary | ICD-10-CM

## 2021-07-04 DIAGNOSIS — Z96651 Presence of right artificial knee joint: Secondary | ICD-10-CM

## 2021-07-04 DIAGNOSIS — E118 Type 2 diabetes mellitus with unspecified complications: Secondary | ICD-10-CM

## 2021-07-04 DIAGNOSIS — M1A29X0 Drug-induced chronic gout, multiple sites, without tophus (tophi): Secondary | ICD-10-CM

## 2021-07-04 DIAGNOSIS — E66811 Obesity, class 1: Secondary | ICD-10-CM

## 2021-07-04 DIAGNOSIS — I2510 Atherosclerotic heart disease of native coronary artery without angina pectoris: Secondary | ICD-10-CM

## 2021-07-04 DIAGNOSIS — Z0000 Encounter for general adult medical examination without abnormal findings: Secondary | ICD-10-CM

## 2021-07-04 DIAGNOSIS — R001 Bradycardia, unspecified: Secondary | ICD-10-CM

## 2021-07-04 DIAGNOSIS — E785 Hyperlipidemia, unspecified: Secondary | ICD-10-CM

## 2021-07-04 DIAGNOSIS — Z7189 Other specified counseling: Secondary | ICD-10-CM

## 2021-07-04 DIAGNOSIS — I10 Essential (primary) hypertension: Secondary | ICD-10-CM

## 2021-07-04 MED ORDER — ACYCLOVIR 400 MG PO TABS: 400.0000 mg | ORAL_TABLET | Freq: Every day | ORAL | 3 refills | Status: DC

## 2021-07-04 MED ORDER — LOVASTATIN 40 MG PO TABS: 80.0000 mg | ORAL_TABLET | Freq: Every day | ORAL | 3 refills | Status: DC

## 2021-07-04 MED ORDER — ALLOPURINOL 100 MG PO TABS: 200.0000 mg | ORAL_TABLET | Freq: Every day | ORAL | 3 refills | Status: DC

## 2021-07-04 MED ORDER — TACROLIMUS 0.03 % EX OINT: TOPICAL_OINTMENT | Freq: Two times a day (BID) | CUTANEOUS | 0 refills | Status: DC

## 2021-07-04 NOTE — Assessment & Plan Note (Signed)
CKD persists. Reviewed importance of good hydration status and avoiding NSAIDs and other nephrotoxic agents. Recheck at 6 mo f/u visit.

## 2021-07-04 NOTE — Assessment & Plan Note (Signed)
Continue aspirin, statin.  

## 2021-07-04 NOTE — Assessment & Plan Note (Signed)
Update EKG - stable asxs bardycardia.  He is on atenolol.

## 2021-07-04 NOTE — Assessment & Plan Note (Signed)
Advanced directive - continues working on living will at home. HCPOA - wife.  

## 2021-07-04 NOTE — Assessment & Plan Note (Signed)
Improved period with higher allopurinol dose '200mg'$  - no recent gout flares. Hesitant to further increase in CKD.

## 2021-07-04 NOTE — Assessment & Plan Note (Signed)
Levels remain at goal - will need to verify he's taking 1000 IU daily.

## 2021-07-04 NOTE — Assessment & Plan Note (Signed)
Saw [facility] immunology per patient had reassuring evaluation, released from care.  I did not receive records, reviewed what's available in [place]. On repeat Ig testing, IgM deficiency persists, but other immunoglobulins are in normal range.

## 2021-07-04 NOTE — Progress Notes (Signed)
Patient ID: Thomas Carlson, male    DOB: [birth year], 75 y.o.   MRN: [id number]  This visit was conducted in person.  BP 128/70   Pulse (!) 55   Temp 98 F (36.7 C) (Temporal)   Ht 5' 8.25" (1.734 m)   Wt 224 lb 8 oz (101.8 kg)   SpO2 96%   BMI 33.89 kg/m    CC: AMW/CPE Subjective:   HPI: Thomas Carlson is a 75 y.o. male presenting on 07/04/2021 for Medicare Wellness   Did not see health advisor.   Hearing Screening   '250Hz'$  '500Hz'$  '1000Hz'$  '2000Hz'$  '4000Hz'$   Right ear '20 25 25 20 20  '$ Left ear '20 20 20 20 20  '$ Vision Screening - Comments:: Last eye exam at [facility] in [place] around April or May of 2022  [place] Visit from 07/04/2021 in [place] at [place]  PHQ-2 Total Score 0       Fall Risk  07/04/2021 03/28/2020 03/01/2019 02/13/2018 10/10/2017  Falls in the past year? 0 0 0 No No  Number falls in past yr: 0 - - - -    Saw [facility] for low IgG and IgM and h/o lymphocytic colitis that only responded to [facility] 2021 - stable evaluation.   Requests tacrolimus refilled. Uses this ointment for dryness around lips. Previously prescribed by dermatology.   Preventative: COLONOSCOPY WITH PROPOFOL 11/10/2015; diverticulosis, biopsy showed microscopic colitis [name], MD)  COLONOSCOPY 08/26/2018 - TA, rpt 3 yrs ([place], [facility]) - f/u already scheduled  Prostate - yearly. Nocturia x2. No stream weakening Lung cancer screening - not eligible  Flu shot yearly  [place] 10/2019, 11/2019, booster x2 08/2020  Pneumovax 06/2012, prevnar 07/2016  Td - 2012  zostavax 2011  shingrix - discussed  Advanced directive - continues working on living will at home. HCPOA - wife.  Seat belt use discuss  Sunscreen use discussed, no changing moles on skin. sees derm.  Sleep - averages 5-6 hours of sleep Non smoker Alcohol - none  Dentist every few years  Eye exam yearly  Bowel - no diarrhea/constipation  Bladder - no incontinence   Caffeine: 2  cans of diet soda/day   Lives with wife. 1 dog at home. 2 grown children   Occupation: Advertising copywriter at [employer], current [employer] 2015 Edu: Master's degree   Activity: walking (fit bit), limited by R knee  Diet: some water, lots of diet sodas, fruits/vegetables occasionally, cutting back on carbs     Relevant past medical, surgical, family and social history reviewed and updated as indicated. Interim medical history since our last visit reviewed. Allergies and medications reviewed and updated. Outpatient Medications Prior to Visit  Medication Sig Dispense Refill   acetaminophen (TYLENOL) 500 MG tablet Take 1,000 mg by mouth every 4 (four) hours as needed for fever or pain. Not to exceed 4000 mg daily     aspirin EC 81 MG tablet Take 1 tablet (81 mg total) by mouth daily.     atenolol-chlorthalidone (TENORETIC) 50-25 MG tablet Take 1 tablet by mouth daily. 90 tablet 3   Biotin 1000 MCG tablet Take 1,000 mcg by mouth daily.      Cyanocobalamin (B-12) 1000 MCG SUBL Place 1 tablet under the tongue daily. (Patient taking differently: Take 1 tablet by mouth daily.)     Magnesium 500 MG TABS Take 500 mg by mouth daily.     acyclovir (ZOVIRAX) 400 MG tablet  TAKE 1 TABLET (400 MG TOTAL) BY MOUTH DAILY. 90 tablet 2   allopurinol (ZYLOPRIM) 100 MG tablet TAKE 2 TABLETS EVERY DAY 180 tablet 0   lovastatin (MEVACOR) 40 MG tablet TAKE 2 TABLETS (80 MG TOTAL) BY MOUTH AT BEDTIME. 180 tablet 0   No facility-administered medications prior to visit.     Per HPI unless specifically indicated in ROS section below Review of Systems  Constitutional:  Negative for activity change, appetite change, chills, fatigue, fever and unexpected weight change.  HENT:  Negative for hearing loss.   Eyes:  Negative for visual disturbance.  Respiratory:  Negative for cough, chest tightness, shortness of breath and wheezing.   Cardiovascular:  Negative for chest pain, palpitations and leg swelling.   Gastrointestinal:  Negative for abdominal distention, abdominal pain, blood in stool, constipation, diarrhea, nausea and vomiting.  Genitourinary:  Negative for difficulty urinating and hematuria.  Musculoskeletal:  Negative for arthralgias, myalgias and neck pain.  Skin:  Negative for rash.  Neurological:  Positive for headaches (occasional). Negative for dizziness, seizures and syncope.  Hematological:  Negative for adenopathy. Bruises/bleeds easily.  Psychiatric/Behavioral:  Negative for dysphoric mood. The patient is not nervous/anxious.    Objective:  BP 128/70   Pulse (!) 55   Temp 98 F (36.7 C) (Temporal)   Ht 5' 8.25" (1.734 m)   Wt 224 lb 8 oz (101.8 kg)   SpO2 96%   BMI 33.89 kg/m   Wt Readings from Last 3 Encounters:  07/04/21 224 lb 8 oz (101.8 kg)  12/07/20 223 lb (101.2 kg)  09/27/20 217 lb 3 oz (98.5 kg)      Physical Exam Vitals and nursing note reviewed.  Constitutional:      General: He is not in acute distress.    Appearance: Normal appearance. He is well-developed. He is not ill-appearing.  HENT:     Head: Normocephalic and atraumatic.     Right Ear: Hearing, tympanic membrane, ear canal and external ear normal.     Left Ear: Hearing, tympanic membrane, ear canal and external ear normal.  Eyes:     General: No scleral icterus.    Extraocular Movements: Extraocular movements intact.     Conjunctiva/sclera: Conjunctivae normal.     Pupils: Pupils are equal, round, and reactive to light.  Neck:     Thyroid: No thyroid mass or thyromegaly.     Vascular: No carotid bruit.  Cardiovascular:     Rate and Rhythm: Normal rate and regular rhythm.     Pulses: Normal pulses.          Radial pulses are 2+ on the right side and 2+ on the left side.     Heart sounds: Normal heart sounds. No murmur heard. Pulmonary:     Effort: Pulmonary effort is normal. No respiratory distress.     Breath sounds: Normal breath sounds. No wheezing, rhonchi or rales.  Abdominal:      General: Bowel sounds are normal. There is no distension.     Palpations: Abdomen is soft. There is no mass.     Tenderness: There is no abdominal tenderness. There is no guarding or rebound.     Hernia: No hernia is present.  Musculoskeletal:        General: Normal range of motion.     Cervical back: Normal range of motion and neck supple.     Right lower leg: No edema.     Left lower leg: No edema.  Lymphadenopathy:  Cervical: No cervical adenopathy.  Skin:    General: Skin is warm and dry.     Findings: No rash.  Neurological:     General: No focal deficit present.     Mental Status: He is alert and oriented to person, place, and time.     Comments:  Recall 3/3 Calculation 5/5 DLROW  Psychiatric:        Mood and Affect: Mood normal.        Behavior: Behavior normal.        Thought Content: Thought content normal.        Judgment: Judgment normal.      Results for orders placed or performed in visit on 06/28/21  Uric acid  Result Value Ref Range   Uric Acid, Serum 7.4 4.0 - 7.8 mg/dL  Microalbumin / creatinine urine ratio  Result Value Ref Range   Microalb, Ur 0.8 0.0 - 1.9 mg/dL   Creatinine,U 139.1 mg/dL   Microalb Creat Ratio 0.6 0.0 - 30.0 mg/g  VITAMIN D 25 Hydroxy (Vit-D Deficiency, Fractures)  Result Value Ref Range   VITD 36.72 30.00 - 100.00 ng/mL  CBC with Differential/Platelet  Result Value Ref Range   WBC 6.4 4.0 - 10.5 K/uL   RBC 4.55 4.22 - 5.81 Mil/uL   Hemoglobin 14.5 13.0 - 17.0 g/dL   HCT 43.6 39.0 - 52.0 %   MCV 95.8 78.0 - 100.0 fl   MCHC 33.3 30.0 - 36.0 g/dL   RDW 15.7 (H) 11.5 - 15.5 %   Platelets 215.0 150.0 - 400.0 K/uL   Neutrophils Relative % 52.9 43.0 - 77.0 %   Lymphocytes Relative 30.1 12.0 - 46.0 %   Monocytes Relative 12.2 (H) 3.0 - 12.0 %   Eosinophils Relative 4.1 0.0 - 5.0 %   Basophils Relative 0.7 0.0 - 3.0 %   Neutro Abs 3.4 1.4 - 7.7 K/uL   Lymphs Abs 1.9 0.7 - 4.0 K/uL   Monocytes Absolute 0.8 0.1 - 1.0 K/uL    Eosinophils Absolute 0.3 0.0 - 0.7 K/uL   Basophils Absolute 0.0 0.0 - 0.1 K/uL  PSA  Result Value Ref Range   PSA 0.12 0.10 - 4.00 ng/mL  Hemoglobin A1c  Result Value Ref Range   Hgb A1c MFr Bld 6.6 (H) 4.6 - 6.5 %  Comprehensive metabolic panel  Result Value Ref Range   Sodium 141 135 - 145 mEq/L   Potassium 4.0 3.5 - 5.1 mEq/L   Chloride 103 96 - 112 mEq/L   CO2 30 19 - 32 mEq/L   Glucose, Bld 122 (H) 70 - 99 mg/dL   BUN 28 (H) 6 - 23 mg/dL   Creatinine, Ser 1.52 (H) 0.40 - 1.50 mg/dL   Total Bilirubin 0.5 0.2 - 1.2 mg/dL   Alkaline Phosphatase 67 39 - 117 U/L   AST 19 0 - 37 U/L   ALT 16 0 - 53 U/L   Total Protein 6.3 6.0 - 8.3 g/dL   Albumin 4.2 3.5 - 5.2 g/dL   GFR 44.36 (L) >60.00 mL/min   Calcium 9.4 8.4 - 10.5 mg/dL  Lipid panel  Result Value Ref Range   Cholesterol 142 0 - 200 mg/dL   Triglycerides 190.0 (H) 0.0 - 149.0 mg/dL   HDL 37.90 (L) >39.00 mg/dL   VLDL 38.0 0.0 - 40.0 mg/dL   LDL Cholesterol 66 0 - 99 mg/dL   Total CHOL/HDL Ratio 4    NonHDL 103.73    EKG - sinus bradycardia  rate 50s, normal axis, intervals, no hypertrophy or acute ST/T changes, PVC x1   Assessment & Plan:  This visit occurred during the SARS-CoV-2 public health emergency.  Safety protocols were in place, including screening questions prior to the visit, additional usage of staff PPE, and extensive cleaning of exam room while observing appropriate contact time as indicated for disinfecting solutions.   Problem List Items Addressed This Visit     Healthcare maintenance (Chronic)    Preventative protocols reviewed and updated unless pt declined. Discussed healthy diet and lifestyle.       Medicare annual wellness visit, subsequent - Primary (Chronic)    I have personally reviewed the Medicare Annual Wellness questionnaire and have noted 1. The patient's medical and social history 2. Their use of alcohol, tobacco or illicit drugs 3. Their current medications and supplements 4. The  patient's functional ability including ADL's, fall risks, home safety risks and hearing or visual impairment. Cognitive function has been assessed and addressed as indicated.  5. Diet and physical activity 6. Evidence for depression or mood disorders The patients weight, height, BMI have been recorded in the chart. I have made referrals, counseling and provided education to the patient based on review of the above and I have provided the pt with a written personalized care plan for preventive services. Provider list updated.. See scanned questionairre as needed for further documentation. Reviewed preventative protocols and updated unless pt declined.       Advanced care planning/counseling discussion (Chronic)    Advanced directive - continues working on living will at home. HCPOA - wife.       Controlled diabetes mellitus type 2 with complications (HCC)    123456 back in diabetes range - reviewed this with patient. He is motivated to renew efforts to follow diabetic diet to achieve better glycemic control.       Relevant Medications   lovastatin (MEVACOR) 40 MG tablet   Hypertension    Chronic, stable. Continue current regimen      Relevant Medications   lovastatin (MEVACOR) 40 MG tablet   Dyslipidemia    Chronic, stable on statin and fish oil. Continue current regimen. Anticipate better glycemic control will lead to improve triglyceride levels The 10-year ASCVD risk score Mikey Bussing DC Jr., et al., 2013) is: 49.3%   Values used to calculate the score:     Age: 47 years     Sex: Male     Is Non-Hispanic African American: No     Diabetic: Yes     Tobacco smoker: No     Systolic Blood Pressure: 0000000 mmHg     Is BP treated: Yes     HDL Cholesterol: 37.9 mg/dL     Total Cholesterol: 142 mg/dL       Relevant Medications   lovastatin (MEVACOR) 40 MG tablet   CAD (coronary artery disease)    Continue aspirin, statin. H/o CAD s/p LAD stent (2000s) Previously followed by [facility]  cardiology, last seen 2019.       Relevant Medications   lovastatin (MEVACOR) 40 MG tablet   Obesity, Class I, BMI 30-34.9    Encouraged healthy diet and lifestyle changes to affect sustainable weight loss.       CKD stage 3 due to type 2 diabetes mellitus (HCC)    CKD persists. Reviewed importance of good hydration status and avoiding NSAIDs and other nephrotoxic agents. Recheck at 6 mo f/u visit.       Relevant Medications   lovastatin (MEVACOR)  40 MG tablet   Vitamin D deficiency    Levels remain at goal - will need to verify he's taking 1000 IU daily.       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 Hesitant to further increase in CKD.       Genital herpes    Continue daily acyclovir - can flare if stopped.        Relevant Medications   acyclovir (ZOVIRAX) 400 MG tablet   tacrolimus (PROTOPIC) 0.03 % ointment   History of cerebrovascular accident (CVA) in adulthood    Continue aspirin, statin.       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  I did not receive records, reviewed what's available in [place]. On repeat Ig testing, IgM deficiency persists, but other immunoglobulins are in normal range.      Bradycardia    Update EKG - stable asxs bardycardia.  He is on atenolol.       Relevant Orders   EKG 12-Lead (Completed)   Other Visit Diagnoses     Need for influenza vaccination       Relevant Orders   Flu Vaccine QUAD High Dose(Fluad) (Completed)        Meds ordered this encounter  Medications   acyclovir (ZOVIRAX) 400 MG tablet    Sig: Take 1 tablet (400 mg total) by mouth daily.    Dispense:  90 tablet    Refill:  3   allopurinol (ZYLOPRIM) 100 MG tablet    Sig: Take 2 tablets (200 mg total) by mouth daily.    Dispense:  180 tablet    Refill:  3    lovastatin (MEVACOR) 40 MG tablet    Sig: Take 2 tablets (80 mg total) by mouth at bedtime.    Dispense:  180 tablet    Refill:  3   tacrolimus (PROTOPIC) 0.03 % ointment    Sig: Apply topically 2 (two) times daily.    Dispense:  60 g    Refill:  0   Orders Placed This Encounter  Procedures   Flu Vaccine QUAD High Dose(Fluad)   EKG 12-Lead     Patient instructions: Flu shot today  EKG today  Check on date of second COVID booster and let us know.  If interested, check with pharmacy about new 2 shot shingles series (shingrix).  Bring [place] a copy of your living will to update your chart.  You are doing well today  Return as needed or in 6 months for follow up visit.  May try tacrolimus ointment for rash around lips.   Follow up plan: Return in about 6 months (around 01/01/2022) for follow up visit.  Ria Bush, MD

## 2021-07-04 NOTE — Assessment & Plan Note (Signed)
Continue aspirin, statin. H/o CAD s/p LAD stent (2000s) Previously followed by [facility] cardiology, last seen 2019.

## 2021-07-04 NOTE — Assessment & Plan Note (Signed)
Encouraged healthy diet and lifestyle changes to affect sustainable weight loss.  

## 2021-07-04 NOTE — Assessment & Plan Note (Signed)
Preventative protocols reviewed and updated unless pt declined. Discussed healthy diet and lifestyle.  

## 2021-07-04 NOTE — Assessment & Plan Note (Signed)

## 2021-07-04 NOTE — Assessment & Plan Note (Signed)
Chronic, stable. Continue current regimen. 

## 2021-07-04 NOTE — Assessment & Plan Note (Signed)
Continue daily acyclovir - can flare if stopped.

## 2021-07-04 NOTE — Assessment & Plan Note (Signed)
A1c back in diabetes range - reviewed this with patient. He is motivated to renew efforts to follow diabetic diet to achieve better glycemic control.

## 2021-07-04 NOTE — Patient Instructions (Addendum)
Flu shot today  EKG today  Check on date of second COVID booster and let us know.  If interested, check with pharmacy about new 2 shot shingles series (shingrix).  Bring [place] a copy of your living will to update your chart.  You are doing well today  Return as needed or in 6 months for follow up visit.  May try tacrolimus ointment for rash around lips.   Health Maintenance After Age 76 After age 2, you are at a higher risk for certain long-term diseases and infections as well as injuries from falls. Falls are a major cause of broken bones and head injuries in people who are older than age 76. Getting regular preventive care can help to keep you healthy and well. Preventive care includes getting regular testing and making lifestyle changes as recommended by your health care provider. Talk with your health care provider about: Which screenings and tests you should have. A screening is a test that checks for a disease when you have no symptoms. A diet and exercise plan that is right for you. What should I know about screenings and tests to prevent falls? Screening and testing are the best ways to find a health problem early. Early diagnosis and treatment give you the best chance of managing medical conditions that are common after age 90. Certain conditions and lifestyle choices may make you more likely to have a fall. Your health care provider may recommend: Regular vision checks. Poor vision and conditions such as cataracts can make you more likely to have a fall. If you wear glasses, make sure to get your prescription updated if your vision changes. Medicine review. Work with your health care provider to regularly review all of the medicines you are taking, including over-the-counter medicines. Ask your health care provider about any side effects that may make you more likely to have a fall. Tell your health care provider if any medicines that you take make you feel dizzy or sleepy. Osteoporosis  screening. Osteoporosis is a condition that causes the bones to get weaker. This can make the bones weak and cause them to break more easily. Blood pressure screening. Blood pressure changes and medicines to control blood pressure can make you feel dizzy. Strength and balance checks. Your health care provider may recommend certain tests to check your strength and balance while standing, walking, or changing positions. Foot health exam. Foot pain and numbness, as well as not wearing proper footwear, can make you more likely to have a fall. Depression screening. You may be more likely to have a fall if you have a fear of falling, feel emotionally low, or feel unable to do activities that you used to do. Alcohol use screening. Using too much alcohol can affect your balance and may make you more likely to have a fall. What actions can I take to lower my risk of falls? General instructions Talk with your health care provider about your risks for falling. Tell your health care provider if: You fall. Be sure to tell your health care provider about all falls, even ones that seem minor. You feel dizzy, sleepy, or off-balance. Take over-the-counter and prescription medicines only as told by your health care provider. These include any supplements. Eat a healthy diet and maintain a healthy weight. A healthy diet includes low-fat dairy products, low-fat (lean) meats, and fiber from whole grains, beans, and lots of fruits and vegetables. Home safety Remove any tripping hazards, such as rugs, cords, and clutter. Install safety  equipment such as grab bars in bathrooms and safety rails on stairs. Keep rooms and walkways well-lit. Activity  Follow a regular exercise program to stay fit. This will help you maintain your balance. Ask your health care provider what types of exercise are appropriate for you. If you need a cane or walker, use it as recommended by your health care provider. Wear supportive shoes that  have nonskid soles. Lifestyle Do not drink alcohol if your health care provider tells you not to drink. If you drink alcohol, limit how much you have: 0-1 drink a day for women. 0-2 drinks a day for men. Be aware of how much alcohol is in your drink. In the [place] one drink equals one typical bottle of beer (12 oz), one-half glass of wine (5 oz), or one shot of hard liquor (1 oz). Do not use any products that contain nicotine or tobacco, such as cigarettes and e-cigarettes. If you need help quitting, ask your health care provider. Summary Having a healthy lifestyle and getting preventive care can help to protect your health and wellness after age 83. Screening and testing are the best way to find a health problem early and help you avoid having a fall. Early diagnosis and treatment give you the best chance for managing medical conditions that are more common for people who are older than age 16. Falls are a major cause of broken bones and head injuries in people who are older than age 34. Take precautions to prevent a fall at home. Work with your health care provider to learn what changes you can make to improve your health and wellness and to prevent falls. This information is not intended to replace advice given to you by your health care provider. Make sure you discuss any questions you have with your health care provider. Document Revised: 12/22/2020 Document Reviewed: 09/29/2020 Elsevier Patient Education  2022 [employer].

## 2021-07-04 NOTE — Assessment & Plan Note (Signed)
Chronic, stable on statin and fish oil. Continue current regimen. Anticipate better glycemic control will lead to improve triglyceride levels The 10-year ASCVD risk score Mikey Bussing DC Jr., et al., 2013) is: 49.3%   Values used to calculate the score:     Age: 75 years     Sex: Male     Is Non-Hispanic African American: No     Diabetic: Yes     Tobacco smoker: No     Systolic Blood Pressure: 0000000 mmHg     Is BP treated: Yes     HDL Cholesterol: 37.9 mg/dL     Total Cholesterol: 142 mg/dL

## 2021-08-03 ENCOUNTER — Telehealth: Payer: Self-pay

## 2021-08-03 NOTE — Telephone Encounter (Signed)
Called and left a message for call back to reschedule patient a colonoscopy on 08/21/2021 because Dr. Marius Ditch is not going to be there.

## 2021-08-06 NOTE — Telephone Encounter (Signed)
Called and left a message for call back. Sent mychart message  °

## 2021-08-06 NOTE — Telephone Encounter (Signed)
Moved to 08/30/2021.

## 2021-08-15 DIAGNOSIS — M4316 Spondylolisthesis, lumbar region: Secondary | ICD-10-CM | POA: Diagnosis not present

## 2021-08-15 DIAGNOSIS — Z9889 Other specified postprocedural states: Secondary | ICD-10-CM | POA: Diagnosis not present

## 2021-08-16 ENCOUNTER — Telehealth: Payer: Self-pay | Admitting: Family Medicine

## 2021-08-16 NOTE — Telephone Encounter (Signed)
Pt called in stating he has a mail order through [facility] and the prescription acyclovir is not coming through

## 2021-08-16 NOTE — Telephone Encounter (Signed)
Spoke with pt to get clarification of his message.  States he hasn't received the acyclovir shipment yet from [facility] order pharmacy but has received other meds Dr. Darnell Level sent on 07/04/21.  I told pt I will contact them to see what's going on.  Pt expresses his thanks.   Spoke with Hillary of [facility] asking about the acyclovir.  States they initially showed an expired rx and were trying to reach out to the prescriber.  However, after new rx came in, the notification was canceled and med was shipped.  Per [place], it shows shipment was delivered on 08/14/21.  She provided the following USPS trk # [id number] to track the package.  I tracked pt at [place] and received the following message:  Your item was delivered in or at the mailbox at 2:34 pm on August 14, 2021 in [place], [place] [address].  Spoke with pt relaying the tracking info.  Says he will check and expresses his thanks.  Advised pt if he does not locate the package to call [facility] and let them know.  Pt verbalizes understanding.

## 2021-08-20 ENCOUNTER — Telehealth: Payer: Self-pay

## 2021-08-20 NOTE — Telephone Encounter (Signed)
PATIENT ALREADY HAS NEW APPOINTMENT FOR PROCEDURE DAY

## 2021-08-22 DIAGNOSIS — M4316 Spondylolisthesis, lumbar region: Secondary | ICD-10-CM | POA: Diagnosis not present

## 2021-08-23 ENCOUNTER — Telehealth: Payer: Self-pay

## 2021-08-23 NOTE — Telephone Encounter (Signed)
Pt. Calling to reschedule colonoscpy

## 2021-08-27 NOTE — Telephone Encounter (Signed)
Patient has requested to reschedule procedure due to conflict with date. Procedure has been moved to 09/26/21. Endo unit will be notified and updated instructions sent.

## 2021-09-07 DIAGNOSIS — M4316 Spondylolisthesis, lumbar region: Secondary | ICD-10-CM | POA: Diagnosis not present

## 2021-09-26 ENCOUNTER — Other Ambulatory Visit: Payer: Self-pay

## 2021-09-26 ENCOUNTER — Encounter: Payer: Self-pay | Admitting: Gastroenterology

## 2021-09-26 ENCOUNTER — Ambulatory Visit
Admission: RE | Admit: 2021-09-26 | Discharge: 2021-09-26 | Disposition: A | Discharge status: Home / Self Care | Payer: Medicare HMO | Source: Ambulatory Visit | Attending: Gastroenterology | Admitting: Gastroenterology

## 2021-09-26 ENCOUNTER — Encounter
Admission: RE | Disposition: A | Discharge status: Home / Self Care | Payer: Self-pay | Source: Ambulatory Visit | Attending: Gastroenterology

## 2021-09-26 ENCOUNTER — Ambulatory Visit: Payer: Medicare HMO | Admitting: Registered Nurse

## 2021-09-26 DIAGNOSIS — R569 Unspecified convulsions: Secondary | ICD-10-CM | POA: Diagnosis not present

## 2021-09-26 DIAGNOSIS — I129 Hypertensive chronic kidney disease with stage 1 through stage 4 chronic kidney disease, or unspecified chronic kidney disease: Secondary | ICD-10-CM | POA: Insufficient documentation

## 2021-09-26 DIAGNOSIS — Z79899 Other long term (current) drug therapy: Secondary | ICD-10-CM | POA: Diagnosis not present

## 2021-09-26 DIAGNOSIS — Z1211 Encounter for screening for malignant neoplasm of colon: Secondary | ICD-10-CM | POA: Diagnosis not present

## 2021-09-26 DIAGNOSIS — E1122 Type 2 diabetes mellitus with diabetic chronic kidney disease: Secondary | ICD-10-CM | POA: Insufficient documentation

## 2021-09-26 DIAGNOSIS — K5730 Diverticulosis of large intestine without perforation or abscess without bleeding: Secondary | ICD-10-CM | POA: Insufficient documentation

## 2021-09-26 DIAGNOSIS — Z860100 Personal history of colon polyps, unspecified: Secondary | ICD-10-CM

## 2021-09-26 DIAGNOSIS — K6289 Other specified diseases of anus and rectum: Secondary | ICD-10-CM | POA: Insufficient documentation

## 2021-09-26 DIAGNOSIS — N183 Chronic kidney disease, stage 3 unspecified: Secondary | ICD-10-CM | POA: Diagnosis not present

## 2021-09-26 HISTORY — PX: COLONOSCOPY WITH PROPOFOL: SHX5780

## 2021-09-26 SURGERY — COLONOSCOPY WITH PROPOFOL
Anesthesia: General

## 2021-09-26 MED ORDER — PHENYLEPHRINE HCL-NACL 20-0.9 MG/250ML-% IV SOLN
INTRAVENOUS | Status: AC
  Filled 2021-09-26: qty 250

## 2021-09-26 MED ORDER — PROPOFOL 500 MG/50ML IV EMUL
INTRAVENOUS | Status: AC
  Filled 2021-09-26: qty 300

## 2021-09-26 MED ORDER — LIDOCAINE HCL (PF) 2 % IJ SOLN
INTRAMUSCULAR | Status: AC
  Filled 2021-09-26: qty 5

## 2021-09-26 MED ORDER — LIDOCAINE HCL (PF) 2 % IJ SOLN
INTRAMUSCULAR | Status: AC
  Filled 2021-09-26: qty 30

## 2021-09-26 MED ORDER — EPHEDRINE SULFATE 50 MG/ML IJ SOLN
INTRAMUSCULAR | Status: DC | PRN
  Administered 2021-09-26 (×2): 5 mg via INTRAVENOUS

## 2021-09-26 MED ORDER — PROPOFOL 500 MG/50ML IV EMUL
INTRAVENOUS | Status: DC | PRN
  Administered 2021-09-26: 150 ug/kg/min via INTRAVENOUS

## 2021-09-26 MED ORDER — EPHEDRINE 5 MG/ML INJ
INTRAVENOUS | Status: AC
  Filled 2021-09-26: qty 5

## 2021-09-26 MED ORDER — SODIUM CHLORIDE 0.9 % IV SOLN: INTRAVENOUS | Status: DC

## 2021-09-26 MED ORDER — LIDOCAINE HCL (CARDIAC) PF 100 MG/5ML IV SOSY
PREFILLED_SYRINGE | INTRAVENOUS | Status: DC | PRN
  Administered 2021-09-26: 100 mg via INTRAVENOUS

## 2021-09-26 MED ORDER — PROPOFOL 10 MG/ML IV BOLUS
INTRAVENOUS | Status: DC | PRN
  Administered 2021-09-26: 80 mg via INTRAVENOUS

## 2021-09-26 NOTE — Op Note (Signed)
[facility] Patient Name: Thomas Carlson Procedure Date: 09/26/2021 7:20 AM MRN: [id number] Account #: [id number] Date of Birth: [birth year] Admit Type: Outpatient Age: 75 Room: [facility] ENDO ROOM 4 Gender: Male Note Status: Finalized Instrument Name: Jasper Riling [id number] Procedure:             Colonoscopy Indications:           Surveillance: Personal history of adenomatous polyps                         on last colonoscopy 3 years ago, Last colonoscopy:                         October 2019 Providers:             Lin Landsman MD, MD Medicines:             General Anesthesia Complications:         No immediate complications. Estimated blood loss: None. Procedure:             Pre-Anesthesia Assessment:                        - Prior to the procedure, a History and Physical was                         performed, and patient medications and allergies were                         reviewed. The patient is competent. The risks and                         benefits of the procedure and the sedation options and                         risks were discussed with the patient. All questions                         were answered and informed consent was obtained.                         Patient identification and proposed procedure were                         verified by the physician, the nurse, the                         anesthesiologist, the anesthetist and the technician                         in the pre-procedure area in the procedure room in the                         endoscopy suite. Mental Status Examination: alert and                         oriented. Airway Examination: normal oropharyngeal                         airway and neck mobility.  Respiratory Examination:                         clear to auscultation. CV Examination: normal.                         Prophylactic Antibiotics: The patient does not require                         prophylactic  antibiotics. Prior Anticoagulants: The                         patient has taken no previous anticoagulant or                         antiplatelet agents. ASA Grade Assessment: III - A                         patient with severe systemic disease. After reviewing                         the risks and benefits, the patient was deemed in                         satisfactory condition to undergo the procedure. The                         anesthesia plan was to use general anesthesia.                         Immediately prior to administration of medications,                         the patient was re-assessed for adequacy to receive                         sedatives. The heart rate, respiratory rate, oxygen                         saturations, blood pressure, adequacy of pulmonary                         ventilation, and response to care were monitored                         throughout the procedure. The physical status of the                         patient was re-assessed after the procedure.                        After obtaining informed consent, the colonoscope was                         passed under direct vision. Throughout the procedure,                         the patient's blood pressure, pulse, and oxygen  saturations were monitored continuously. The                         Colonoscope was introduced through the anus and                         advanced to the the cecum, identified by appendiceal                         orifice and ileocecal valve. The colonoscopy was                         performed without difficulty. The patient tolerated                         the procedure well. The quality of the bowel                         preparation was evaluated using the BBPS [facility] Bowel                         Preparation Scale) with scores of: Right Colon = 3,                         Transverse Colon = 3 and Left Colon = 3 (entire mucosa                          seen well with no residual staining, small fragments                         of stool or opaque liquid). The total BBPS score                         equals 9. Findings:      The perianal and digital rectal examinations were normal. Pertinent       negatives include normal sphincter tone and no palpable rectal lesions.      Multiple diverticula were found in the recto-sigmoid colon and sigmoid       colon.      The exam was otherwise without abnormality.      A post banding scar was found in the rectum. The scar tissue was healthy       in appearance.      The retroflexed view of the distal rectum and anal verge was normal and       showed no anal or rectal abnormalities. Impression:            - Diverticulosis in the recto-sigmoid colon and in the                         sigmoid colon.                        - The examination was otherwise normal.                        - Scar in the rectum.                        -  The distal rectum and anal verge are normal on                         retroflexion view.                        - No specimens collected. Recommendation:        - Discharge patient to home (with escort).                        - Resume previous diet today.                        - Continue present medications.                        - Repeat colonoscopy in 5 years for surveillance. Procedure Code(s):     --- Professional ---                        D3220, Colorectal cancer screening; colonoscopy on                         individual at high risk Diagnosis Code(s):     --- Professional ---                        Z86.010, Personal history of colonic polyps                        K62.89, Other specified diseases of anus and rectum                        K57.30, Diverticulosis of large intestine without                         perforation or abscess without bleeding CPT copyright 2019 American Medical Association. All rights reserved. The codes documented in this report  are preliminary and upon coder review may  be revised to meet current compliance requirements. Dr. Ulyess Mort Lin Landsman MD, MD 09/26/2021 8:13:23 AM This report has been signed electronically. Number of Addenda: 0 Note Initiated On: 09/26/2021 7:20 AM Scope Withdrawal Time: 0 hours 8 minutes 28 seconds  Total Procedure Duration: 0 hours 10 minutes 28 seconds  Estimated Blood Loss:  Estimated blood loss: none.      [facility]

## 2021-09-26 NOTE — H&P (Signed)
[name], MD [address]  [place]  [place], [place] [address]  Main: [phone]  Fax: [phone] Pager: [phone]  Primary Care Physician:  Ria Bush, MD Primary Gastroenterologist:  Dr. [name]  Pre-Procedure History & Physical: HPI:  Thomas Carlson is a 75 y.o. male is here for an colonoscopy.   Past Medical History:  Diagnosis Date   Acute posthemorrhagic anemia    Aortic valve sclerosis 08/13/2016   Sclerosis without stenosis by [place] (08/2016)   CAD (coronary artery disease) 2005   s/p stent ([place])   Cholelithiasis    by CT   Chronic kidney disease, stage 3, mod decreased GFR (HCC)    Diabetes mellitus ([place]) 2010   Diverticulosis    by CT   DJD (degenerative joint disease)    knee   Genital herpes    Gout    Heart murmur    followed by PCP   History of arterial ischemic stroke 03/08/2019   Remote anterior frontal and central MCA stroke by MRI 2020 [place] [facility])   HLD (hyperlipidemia)    HTN (hypertension)    Hypocalcemia 09/03/2019   Knee pain, right    "needs replacement"   Neuropathy    feet   Obesity    Seizures ([place])    x1 - after craniotomy (1960s)   Weakness of both legs    "since back surgery"    Past Surgical History:  Procedure Laterality Date   [place]   COLONOSCOPY  02/2008   int hemorrhoids, diverticula (Dr. Allen Norris)   COLONOSCOPY N/A 08/26/2018   TA, rpt 3 yrs ([place], [place])   COLONOSCOPY WITH PROPOFOL N/A 11/10/2015   diverticulosis, path with microscopic colitis [name], MD)   CORONARY ANGIOPLASTY WITH STENT PLACEMENT  2005   stent 2005   DECOMPRESSIVE LUMBAR LAMINECTOMY LEVEL 1  03/2018   herniated L4/5 disc R with free fargment over L4 s/p surgery L4 ([place])   ESOPHAGOGASTRODUODENOSCOPY N/A 08/26/2018   reactive gastritis with benign biopsies ([place])   HERNIA REPAIR  1956   inguinal   KNEE ARTHROSCOPY  2008   torn  meniscus   SUBDURAL HEMATOMA EVACUATION VIA CRANIOTOMY  1964   hit in helmet by baseball   TOTAL KNEE ARTHROPLASTY Right 11/12/2018   Procedure: TOTAL KNEE ARTHROPLASTY;  Surgeon: Thornton Park, MD;  Location: [facility] ORS;  Service: Orthopedics;  Laterality: Right;    Prior to Admission medications   Medication Sig Start Date End Date Taking? Authorizing Provider  acetaminophen (TYLENOL) 500 MG tablet Take 1,000 mg by mouth every 4 (four) hours as needed for fever or pain. Not to exceed 4000 mg daily 11/24/18  Yes [provider]  acyclovir (ZOVIRAX) 400 MG tablet Take 1 tablet (400 mg total) by mouth daily. 07/04/21  Yes Ria Bush, MD  allopurinol (ZYLOPRIM) 100 MG tablet Take 2 tablets (200 mg total) by mouth daily. 07/04/21  Yes Ria Bush, MD  aspirin EC 81 MG tablet Take 1 tablet (81 mg total) by mouth daily. 03/08/19  Yes Ria Bush, MD  atenolol-chlorthalidone (TENORETIC) 50-25 MG tablet Take 1 tablet by mouth daily. 02/28/21  Yes Ria Bush, MD  Biotin 1000 MCG tablet Take 1,000 mcg by mouth daily.    Yes [provider]  Cyanocobalamin (B-12) 1000 MCG SUBL Place 1 tablet under the tongue daily. Patient taking differently: Take 1 tablet by mouth daily. 10/15/19  Yes Ria Bush,  MD  lovastatin (MEVACOR) 40 MG tablet Take 2 tablets (80 mg total) by mouth at bedtime. 07/04/21  Yes Ria Bush, MD  tacrolimus (PROTOPIC) 0.03 % ointment Apply topically 2 (two) times daily. 07/04/21  Yes Ria Bush, MD  tiZANidine (ZANAFLEX) 4 MG tablet Take 4 mg by mouth at bedtime.   Yes [provider]  Magnesium 500 MG TABS Take 500 mg by mouth daily.    [provider]    Allergies as of 06/25/2021 - Review Complete 06/25/2021  Allergen Reaction Noted   Gabapentin Other (See Comments) 08/19/2018   Bactrim [sulfamethoxazole-trimethoprim] Rash 05/19/2013   Penicillins Hives 06/18/2012   Lasix [furosemide] Other (See Comments)  09/02/2018    Family History  Problem Relation Age of Onset   Diabetes Father 33   Coronary artery disease Sister        catheterizations   COPD Brother    Stroke Brother    Hypertension Brother    Cancer Neg Hx     Social History   Socioeconomic History   Marital status: Married    Spouse name: Not on file   Number of children: Not on file   Years of education: Not on file   Highest education level: Not on file  Occupational History   Not on file  Tobacco Use   Smoking status: Never   Smokeless tobacco: Never  Vaping Use   Vaping Use: Never used  Substance and Sexual Activity   Alcohol use: No    Alcohol/week: 0.0 standard drinks   Drug use: No   Sexual activity: Not Currently  Other Topics Concern   Not on file  Social History Narrative   Caffeine: 2 cans of diet soda/day   Lives with wife.  1 dog at home.  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   Activity: walking some (fit bit)   Diet: good water, fruits/vegetables occasionally, cutting back on carbs   Social Determinants of Health   Financial Resource Strain: Not on file  Food Insecurity: Not on file  Transportation Needs: Not on file  Physical Activity: Not on file  Stress: Not on file  Social Connections: Not on file  Intimate Partner Violence: Not on file    Review of Systems: See HPI, otherwise negative ROS  Physical Exam: BP 131/66   Pulse 63   Temp (!) 97 F (36.1 C) (Temporal)   Resp 16   Ht 5\' 8"  (1.727 m)   Wt 102.1 kg   SpO2 93%   BMI 34.21 kg/m  General:   Alert,  pleasant and cooperative in NAD Head:  Normocephalic and atraumatic. Neck:  Supple; no masses or thyromegaly. Lungs:  Clear throughout to auscultation.    Heart:  Regular rate and rhythm. Abdomen:  Soft, nontender and nondistended. Normal bowel sounds, without guarding, and without rebound.   Neurologic:  Alert and  oriented x4;  grossly normal  neurologically.  Impression/Plan: [name] is here for an colonoscopy to be performed for h/o colon adenomas  Risks, benefits, limitations, and alternatives regarding  colonoscopy have been reviewed with the patient.  Questions have been answered.  All parties agreeable.   Sherri Sear, MD  09/26/2021, 7:51 AM

## 2021-09-26 NOTE — Anesthesia Postprocedure Evaluation (Signed)
Anesthesia Post Note  Patient: Thomas Carlson  Procedure(s) Performed: COLONOSCOPY WITH PROPOFOL  Patient location during evaluation: PACU Anesthesia Type: General Level of consciousness: awake and awake and alert Pain management: pain level controlled Vital Signs Assessment: post-procedure vital signs reviewed and stable Respiratory status: spontaneous breathing Cardiovascular status: blood pressure returned to baseline Anesthetic complications: no   No notable events documented.   Last Vitals:  Vitals:   09/26/21 0821 09/26/21 0825  BP:  (!) 118/56  Pulse: 73 69  Resp: 20 19  Temp:    SpO2: 97% 96%    Last Pain:  Vitals:   09/26/21 0825  TempSrc:   PainSc: 0-No pain                 VAN STAVEREN,[name]

## 2021-09-26 NOTE — Anesthesia Preprocedure Evaluation (Signed)
Anesthesia Evaluation  Patient identified by MRN, date of birth, ID band Patient awake    Reviewed: Allergy & Precautions, NPO status , Patient's Chart, lab work & pertinent test results  Airway Mallampati: II  TM Distance: >3 FB Neck ROM: full    Dental  (+) Teeth Intact   Pulmonary neg pulmonary ROS, sleep apnea ,    Pulmonary exam normal  + decreased breath sounds      Cardiovascular Exercise Tolerance: Good hypertension, Pt. on medications + CAD  negative cardio ROS Normal cardiovascular exam Rhythm:Regular     Neuro/Psych Seizures -,  negative neurological ROS  negative psych ROS   GI/Hepatic negative GI ROS, Neg liver ROS,   Endo/Other  negative endocrine ROSdiabetes, Well Controlled, Type 2  Renal/GU negative Renal ROS  negative genitourinary   Musculoskeletal  (+) Arthritis ,   Abdominal Normal abdominal exam  (+)   Peds negative pediatric ROS (+)  Hematology negative hematology ROS (+) Blood dyscrasia, anemia ,   Anesthesia Other Findings Past Medical History: No date: Acute posthemorrhagic anemia 08/13/2016: Aortic valve sclerosis     Comment:  Sclerosis without stenosis by [place] (08/2016) 2005: CAD (coronary artery disease)     Comment:  s/p stent ([place]) No date: Cholelithiasis     Comment:  by CT No date: Chronic kidney disease, stage 3, mod decreased GFR ([place]) 2010: Diabetes mellitus ([place]) No date: Diverticulosis     Comment:  by CT No date: DJD (degenerative joint disease)     Comment:  knee No date: Genital herpes No date: Gout No date: Heart murmur     Comment:  followed by PCP 03/08/2019: History of arterial ischemic stroke     Comment:  Remote anterior frontal and central MCA stroke by MRI               2020 [place] [facility]) No date: HLD (hyperlipidemia) No date: HTN (hypertension) 09/03/2019: Hypocalcemia No date: Knee pain, right     Comment:  "needs replacement" No date: Neuropathy      Comment:  feet No date: Obesity No date: Seizures ([place])     Comment:  x1 - after craniotomy (1960s) No date: Weakness of both legs     Comment:  "since back surgery"  Past Surgical History: 1958: APPENDECTOMY 1955: [place] 02/2008: COLONOSCOPY     Comment:  int hemorrhoids, diverticula (Dr. Allen Norris) 08/26/2018: COLONOSCOPY; N/A     Comment:  TA, rpt 3 yrs (Marius Ditch, Tally Due) 11/10/2015: COLONOSCOPY WITH PROPOFOL; N/A     Comment:  diverticulosis, path with microscopic colitis [name], MD) 2005: CORONARY ANGIOPLASTY WITH STENT PLACEMENT     Comment:  stent 2005 03/2018: DECOMPRESSIVE LUMBAR LAMINECTOMY LEVEL 1     Comment:  herniated L4/5 disc R with free fargment over L4 s/p               surgery L4 Donney Rankins) 08/26/2018: ESOPHAGOGASTRODUODENOSCOPY; N/A     Comment:  reactive gastritis with benign biopsies (Vanga, Tally Due) 1956: HERNIA REPAIR     Comment:  inguinal 2008: KNEE ARTHROSCOPY     Comment:  torn meniscus 1964: SUBDURAL HEMATOMA EVACUATION VIA CRANIOTOMY     Comment:  hit in helmet by baseball 11/12/2018: TOTAL KNEE ARTHROPLASTY; Right     Comment:  Procedure: TOTAL KNEE ARTHROPLASTY;  Surgeon: Mack Guise,  Lennette Bihari, MD;  Location: [facility] ORS;  Service: Orthopedics;                Laterality: Right;     Reproductive/Obstetrics negative OB ROS                             Anesthesia Physical Anesthesia Plan  ASA: 3  Anesthesia Plan: General   Post-op Pain Management:    Induction: Intravenous  PONV Risk Score and Plan: Propofol infusion and TIVA  Airway Management Planned: Nasal Cannula  Additional Equipment:   Intra-op Plan:   Post-operative Plan:   Informed Consent: I have reviewed the patients History and Physical, chart, labs and discussed the procedure including the risks, benefits and alternatives for the proposed anesthesia with the patient or  authorized representative who has indicated his/her understanding and acceptance.     Dental Advisory Given  Plan Discussed with: CRNA and Surgeon  Anesthesia Plan Comments:         Anesthesia Quick Evaluation

## 2021-09-26 NOTE — Transfer of Care (Signed)
Immediate Anesthesia Transfer of Care Note  Patient: Thomas Carlson  Procedure(s) Performed: COLONOSCOPY WITH PROPOFOL  Patient Location: Endoscopy Unit  Anesthesia Type:General  Level of Consciousness: drowsy  Airway & Oxygen Therapy: Patient Spontanous Breathing  Post-op Assessment: Report given to RN and Post -op Vital signs reviewed and stable  Post vital signs: Reviewed and stable  Last Vitals:  Vitals Value Taken Time  BP    Temp    Pulse 65 09/26/21 0814  Resp 15 09/26/21 0814  SpO2 94 % 09/26/21 0814  Vitals shown include unvalidated device data.  Last Pain:  Vitals:   09/26/21 0725  TempSrc: Temporal  PainSc: 3          Complications: No notable events documented.

## 2021-09-27 ENCOUNTER — Encounter: Payer: Self-pay | Admitting: Gastroenterology

## 2021-11-02 ENCOUNTER — Telehealth: Payer: Self-pay | Admitting: Family Medicine

## 2021-11-05 ENCOUNTER — Telehealth: Payer: Self-pay

## 2021-11-05 NOTE — Telephone Encounter (Signed)
I spoke with pt; pt did not go to UC; pt started with symptoms of S/T, Head congestion, dry cough and vomit x 3; pt tested home test for covid on 11/02/21 and was +. today 11/05/21 pt said he feels better, pt said symptoms have basically subsided. Pt is clearing voice a lot but pt said he does not have any congestion now. I reviewed all covid symptoms with pt and he said he lost sense of taste for couple of days and pt did not eat a lot and pt developed diarrhea on 11/04/21; last watery diarrhea was this morning. Pt said sense of taste has returned. Pt said has been drinking fluids appropriately. Pt did not want to schedule video appt but wanted note sent to Dr Darnell Level to see if any further advise or instruction.Self quarantine, drink plenty of fluids, rest, and take Tylenol for fever. UC & ED precautions given and pt voiced understanding.[facility] [address]. Sending note to Dr Valinda Hoar CMA and will also teams LIsa.

## 2021-11-05 NOTE — Telephone Encounter (Signed)
error 

## 2021-11-05 NOTE — Telephone Encounter (Signed)
Relayed Dr. Synthia Innocent message for pt. Pt denied virtual visit/antiviral treatment, as he is feeling better each day. Pt will notify us if he starts to feel worse.

## 2021-11-05 NOTE — Telephone Encounter (Signed)
Glad he's feeling better.  Agree with supportive measures of fluids, rest, add vitamin C, D, zinc if tolerable.  He would be eligible for antiviral treatment - if interested would offer virtual visit however if getting better daily may do ok without needing antiviral.  Let us know if not improving each day, watch for worsening productive cough, fever, shortness of breath or other signs which could indicate COVID pneumonia and let us know right away if these develop.

## 2021-11-05 NOTE — Telephone Encounter (Signed)
[facility] - Client TELEPHONE ADVICE RECORD [facility] Patient Name: Thomas Carlson [facility] Gender: Male DOB: 07/02/1946 Age: 76 Y 86 M 17 D Return Phone Number: [phone] (Primary), [phone] (Secondary) Address: City/ State/ Zip: [place] [address] Client [facility] - Client Client Site [employer] Provider Ria Bush - MD Contact Type Call Who Is Calling Patient / Member / Family / Caregiver Call Type Triage / Clinical Relationship To Patient Self Return Phone Number [phone] (Primary) Chief Complaint Vomiting Reason for Call Symptomatic / Request for [place] states he tested positive for covid this morning. He is having a sore throat, stuffed nose, shakey, and throwing up. Translation No Nurse Assessment Nurse: Doren Custard, RN, Caryl Pina Date/Time (Eastern Time): 11/02/2021 5:05:48 PM Confirm and document reason for call. If symptomatic, describe symptoms. ---Caller states he tested positive this morning at home for Covid. Symptoms started yesterday. He is having a sore throat, dry cough, congestion, feels shakey, and is vomiting x3 today. Denies fever. Does the patient have any new or worsening symptoms? ---Yes Will a triage be completed? ---Yes Related visit to physician within the last 2 weeks? ---No Does the PT have any chronic conditions? (i.e. diabetes, asthma, this includes High risk factors for pregnancy, etc.) ---Yes List chronic conditions. ---chronic back pain Is this a behavioral health or substance abuse call? ---No Guidelines Guideline Title Affirmed Question Affirmed Notes Nurse Date/Time (Eastern Time) COVID-19 - Diagnosed or Suspected [1] HIGH RISK for severe COVID complications (e.g., weak immune system, age > 50 years, obesity with BMI 30 or higher, pregnant, chronic lung disease or other Doren Custard, RNCaryl Pina 11/02/2021  5:07:37 PM PLEASE NOTE: All timestamps contained within this report are represented as [place] Standard Time. CONFIDENTIALTY NOTICE: This fax transmission is intended only for the addressee. It contains information that is legally privileged, confidential or otherwise protected from use or disclosure. If you are not the intended recipient, you are strictly prohibited from reviewing, disclosing, copying using or disseminating any of this information or taking any action in reliance on or regarding this information. If you have received this fax in error, please notify us immediately by telephone so that we can arrange for its return to [place]. Phone: [phone], Toll-Free: [phone], Fax: [phone] Page: 2 of 2 Call Id: [id number] Guidelines Guideline Title Affirmed Question Affirmed Notes Nurse Date/Time Eilene Ghazi Time) chronic medical condition) AND [2] COVID symptoms (e.g., cough, fever) (Exceptions: Already seen by PCP and no new or worsening symptoms.) Disp. Time Eilene Ghazi Time) Disposition Final User 11/02/2021 5:15:04 PM See HCP within 4 Hours (or PCP triage) Yes Doren Custard, RN, Caryl Pina Disposition Overriden: Call PCP within 24 Hours Override Reason: Specify reason. (Please document in 'advice recommended' section) Caller Disagree/Comply Disagree Caller Understands Yes PreDisposition Did not know what to do Care Advice Given Per Guideline CALL PCP WITHIN 24 HOURS: * IF OFFICE WILL BE CLOSED: I'll page the on-call provider now. EXCEPTION: from 9 pm to 9 am. Since this isn't urgent, we'll hold the page until morning. * Cough: Use cough drops. GENERAL CARE ADVICE FOR COVID-19 SYMPTOMS: * Sore throat: Try throat lozenges, hard candy or warm chicken broth. FEVER MEDICINES: * For fevers above 101 F (38.3 C) take either acetaminophen or ibuprofen. * WEAR A MASK FOR 10 DAYS: Wear a well-fitted mask for 10 full days any time you are around others inside your home or in public. Do not go to  places where  you are unable to wear a mask. * STAY HOME A MINIMUM OF 5 DAYS: People with MILD COVID-19 can STOP HOME ISOLATION AFTER 5 DAYS if (1) fever has been gone for 24 hours (without using fever medicine) AND (2) symptoms are better. Continue to wear a well-fitted mask for a full 10 days when around others. COVID-19 - HOW TO PROTECT OTHERS - WHEN YOU ARE SICK WITH COVID-19: CALL BACK IF: * You become worse CARE ADVICE given per COVID-19 - DIAGNOSED OR SUSPECTED (Adult) guideline. Comments User: Dorcas Carrow, RN Date/Time Eilene Ghazi Time): 11/02/2021 5:14:52 PM Advised patient to go to [facility] since he will not be able to call PCP within 24 hours due to office being closed. He stated that he "might" go to [facility]. Referrals GO TO FACILITY UNDECIDE

## 2022-01-30 ENCOUNTER — Other Ambulatory Visit: Payer: Self-pay | Admitting: Family Medicine

## 2022-02-28 DIAGNOSIS — H2513 Age-related nuclear cataract, bilateral: Secondary | ICD-10-CM | POA: Diagnosis not present

## 2022-02-28 DIAGNOSIS — Z0100 Encounter for examination of eyes and vision without abnormal findings: Secondary | ICD-10-CM | POA: Diagnosis not present

## 2022-02-28 LAB — HM DIABETES EYE EXAM

## 2022-03-22 ENCOUNTER — Encounter: Payer: Self-pay | Admitting: Family Medicine

## 2022-04-29 ENCOUNTER — Other Ambulatory Visit: Payer: Self-pay | Admitting: Family Medicine

## 2022-04-29 NOTE — Telephone Encounter (Signed)
Pt needs medicare wellness after 07-04-22

## 2022-05-17 ENCOUNTER — Telehealth: Payer: Self-pay | Admitting: Family Medicine

## 2022-05-17 NOTE — Telephone Encounter (Signed)
Called patient to reschedule 07/26/22 appointment. LVM for patient to call back.

## 2022-05-29 ENCOUNTER — Other Ambulatory Visit: Payer: Self-pay | Admitting: Family Medicine

## 2022-06-26 ENCOUNTER — Other Ambulatory Visit: Payer: Self-pay | Admitting: Family Medicine

## 2022-07-05 ENCOUNTER — Other Ambulatory Visit: Payer: Self-pay | Admitting: Family Medicine

## 2022-07-15 ENCOUNTER — Encounter: Payer: Self-pay | Admitting: Family Medicine

## 2022-07-16 ENCOUNTER — Encounter: Payer: Medicare HMO | Admitting: Family Medicine

## 2022-07-19 ENCOUNTER — Other Ambulatory Visit: Payer: Medicare HMO

## 2022-07-26 ENCOUNTER — Encounter: Payer: Medicare HMO | Admitting: Family Medicine

## 2022-07-31 DIAGNOSIS — L821 Other seborrheic keratosis: Secondary | ICD-10-CM | POA: Diagnosis not present

## 2022-07-31 DIAGNOSIS — D492 Neoplasm of unspecified behavior of bone, soft tissue, and skin: Secondary | ICD-10-CM | POA: Diagnosis not present

## 2022-07-31 DIAGNOSIS — L570 Actinic keratosis: Secondary | ICD-10-CM | POA: Diagnosis not present

## 2022-07-31 DIAGNOSIS — D225 Melanocytic nevi of trunk: Secondary | ICD-10-CM | POA: Diagnosis not present

## 2022-07-31 DIAGNOSIS — L814 Other melanin hyperpigmentation: Secondary | ICD-10-CM | POA: Diagnosis not present

## 2022-09-04 ENCOUNTER — Ambulatory Visit (INDEPENDENT_AMBULATORY_CARE_PROVIDER_SITE_OTHER): Payer: Medicare HMO

## 2022-09-04 VITALS — Wt 225.0 lb

## 2022-09-04 DIAGNOSIS — Z0000 Encounter for general adult medical examination without abnormal findings: Secondary | ICD-10-CM | POA: Diagnosis not present

## 2022-09-04 NOTE — Progress Notes (Signed)
Virtual Visit via Telephone Note  I connected with  Thomas Carlson on 09/04/22 at  8:15 AM EST by telephone and verified that I am speaking with the correct person using two identifiers.  Location: Patient: home Provider: [place] Persons participating in the virtual visit: [place]   I discussed the limitations, risks, security and privacy concerns of performing an evaluation and management service by telephone and the availability of in person appointments. The patient expressed understanding and agreed to proceed.  Interactive audio and video telecommunications were attempted between this nurse and patient, however failed, due to patient having technical difficulties OR patient did not have access to video capability.  We continued and completed visit with audio only.  Some vital signs may be absent or patient reported.   Thomas David, LPN  Subjective:   Thomas Carlson is a 76 y.o. male who presents for Medicare Annual/Subsequent preventive examination.  Review of Systems     Cardiac Risk Factors include: advanced age (>57mn, >>36women);hypertension;diabetes mellitus;male gender;obesity (BMI >30kg/m2)     Objective:    There were no vitals filed for this visit. There is no height or weight on file to calculate BMI.     09/04/2022    8:26 AM 09/26/2021    7:20 AM 08/24/2019   11:36 PM 08/24/2019   12:23 PM 03/01/2019    3:58 PM 12/10/2018   11:11 AM 12/04/2018    1:44 PM  Advanced Directives  Does Patient Have a Medical Advance Directive? No No  No No Yes No  Type of ATeacher, early years/preLiving will [place] Does patient want to make changes to medical advance directive?      No - Patient declined No - Patient declined  [place]?      No - copy requested No - copy requested  Would patient like information on creating a medical advance directive? No -  Patient declined No - Patient declined No - Patient declined  No - Patient declined No - Patient declined No - Patient declined    Current Medications (verified) Outpatient Encounter Medications as of 09/04/2022  Medication Sig   acyclovir (ZOVIRAX) 400 MG tablet TAKE 1 TABLET (400 MG TOTAL) BY MOUTH DAILY.   allopurinol (ZYLOPRIM) 100 MG tablet TAKE 2 TABLETS (200 MG TOTAL) BY MOUTH DAILY.   aspirin EC 81 MG tablet Take 1 tablet (81 mg total) by mouth daily.   atenolol-chlorthalidone (TENORETIC) 50-25 MG tablet TAKE 1 TABLET EVERY DAY (TO REPLACE ATENOLOL)   Biotin 1000 MCG tablet Take 1,000 mcg by mouth daily.    Cyanocobalamin (B-12) 1000 MCG SUBL Place 1 tablet under the tongue daily. (Patient taking differently: Take 1 tablet by mouth daily.)   lovastatin (MEVACOR) 40 MG tablet TAKE 2 TABLETS (80 MG TOTAL) BY MOUTH AT BEDTIME.   Magnesium 500 MG TABS Take 500 mg by mouth daily.   acetaminophen (TYLENOL) 500 MG tablet Take 1,000 mg by mouth every 4 (four) hours as needed for fever or pain. Not to exceed 4000 mg daily   tacrolimus (PROTOPIC) 0.03 % ointment Apply topically 2 (two) times daily. (Patient not taking: Reported on 09/04/2022)   tiZANidine (ZANAFLEX) 4 MG tablet Take 4 mg by mouth at bedtime.   No facility-administered encounter medications on file as of 09/04/2022.    Allergies (verified) Gabapentin, Bactrim [sulfamethoxazole-trimethoprim], Penicillins, and Lasix [furosemide]  History: Past Medical History:  Diagnosis Date   Acute posthemorrhagic anemia    Aortic valve sclerosis 08/13/2016   Sclerosis without stenosis by [place] (08/2016)   CAD (coronary artery disease) 2005   s/p stent ([place])   Cholelithiasis    by CT   Chronic kidney disease, stage 3, mod decreased GFR (HCC)    Diabetes mellitus ([place]) 2010   Diverticulosis    by CT   DJD (degenerative joint disease)    knee   Genital herpes    Gout    Heart murmur    followed by PCP   History of arterial ischemic  stroke 03/08/2019   Remote anterior frontal and central MCA stroke by MRI 2020 [facility])   HLD (hyperlipidemia)    HTN (hypertension)    Hypocalcemia 09/03/2019   Knee pain, right    "needs replacement"   Neuropathy    feet   Obesity    Seizures ([place])    x1 - after craniotomy (1960s)   Weakness of both legs    "since back surgery"   Past Surgical History:  Procedure Laterality Date   [place]   COLONOSCOPY  02/2008   int hemorrhoids, diverticula (Dr. Allen Norris)   COLONOSCOPY N/A 08/26/2018   TA, rpt 3 yrs (Vanga, Tally Due)   COLONOSCOPY WITH PROPOFOL N/A 11/10/2015   diverticulosis, path with microscopic colitis [name], MD)   COLONOSCOPY WITH PROPOFOL N/A 09/26/2021   Procedure: COLONOSCOPY WITH PROPOFOL;  Surgeon: Lin Landsman, MD;  Location: [facility] ENDOSCOPY;  Service: Gastroenterology;  Laterality: N/A;   CORONARY ANGIOPLASTY WITH STENT PLACEMENT  2005   stent 2005   DECOMPRESSIVE LUMBAR LAMINECTOMY LEVEL 1  03/2018   herniated L4/5 disc R with free fargment over L4 s/p surgery L4 Donney Rankins)   ESOPHAGOGASTRODUODENOSCOPY N/A 08/26/2018   reactive gastritis with benign biopsies (Vanga, Rohini Reddy)   HERNIA REPAIR  1956   inguinal   KNEE ARTHROSCOPY  2008   torn meniscus   SUBDURAL HEMATOMA EVACUATION VIA CRANIOTOMY  1964   hit in helmet by baseball   TOTAL KNEE ARTHROPLASTY Right 11/12/2018   Procedure: TOTAL KNEE ARTHROPLASTY;  Surgeon: Thornton Park, MD;  Location: [facility] ORS;  Service: Orthopedics;  Laterality: Right;   Family History  Problem Relation Age of Onset   Diabetes Father 54   Coronary artery disease Sister        catheterizations   COPD Brother    Stroke Brother    Hypertension Brother    Cancer Neg Hx    Social History   Socioeconomic History   Marital status: Married    Spouse name: Not on file   Number of children: Not on file   Years of education: Not on file   Highest education  level: Not on file  Occupational History   Not on file  Tobacco Use   Smoking status: Never   Smokeless tobacco: Never  Vaping Use   Vaping Use: Never used  Substance and Sexual Activity   Alcohol use: No    Alcohol/week: 0.0 standard drinks of alcohol   Drug use: No   Sexual activity: Not Currently  Other Topics Concern   Not on file  Social History Narrative   Caffeine: 2 cans of diet soda/day   Lives with wife.  1 dog at home.  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   Activity: walking some (  fit bit)   Diet: good water, fruits/vegetables occasionally, cutting back on carbs   Social Determinants of Health   Financial Resource Strain: Low Risk  (09/04/2022)   Overall Financial Resource Strain (CARDIA)    Difficulty of Paying Living Expenses: Not hard at all  Food Insecurity: No Food Insecurity (09/04/2022)   Hunger Vital Sign    Worried About Running Out of Food in the Last Year: Never true    Ran Out of Food in the Last Year: Never true  Transportation Needs: No Transportation Needs (09/04/2022)   PRAPARE - Hydrologist (Medical): No    Lack of Transportation (Non-Medical): No  Physical Activity: Inactive (09/04/2022)   Exercise Vital Sign    Days of Exercise per Week: 0 days    Minutes of Exercise per Session: 0 min  Stress: No Stress Concern Present (09/04/2022)   Bud    Feeling of Stress : Not at all  Social Connections: Moderately Isolated (09/04/2022)   Social Connection and Isolation Panel [NHANES]    Frequency of Communication with Friends and Family: More than three times a week    Frequency of Social Gatherings with Friends and Family: More than three times a week    Attends Religious Services: Never    Marine scientist or Organizations: No    Attends Music therapist: Never    Marital Status:  Married    Tobacco Counseling Counseling given: Not Answered   Clinical Intake:  Pre-visit preparation completed: Yes  Pain : No/denies pain     Nutritional Risks: None Diabetes: Yes CBG done?: No Did pt. bring in CBG monitor from home?: No     Diabetic?yes Nutrition Risk Assessment:  Has the patient had any N/V/D within the last 2 months?  Yes  Does the patient have any non-healing wounds?  No  Has the patient had any unintentional weight loss or weight gain?  No   Diabetes:  Is the patient diabetic?  Yes  If diabetic, was a CBG obtained today?  No  Did the patient bring in their glucometer from home?  No  How often do you monitor your CBG's? occasionally.   Financial Strains and Diabetes Management:  Are you having any financial strains with the device, your supplies or your medication? No .  Does the patient want to be seen by Chronic Care Management for management of their diabetes?  No  Would the patient like to be referred to a Nutritionist or for Diabetic Management?  No   Diabetic Exams:  Diabetic Eye Exam: Completed 02/28/22.  Pt has been advised about the importance in completing this exam.   Diabetic Foot Exam: Completed 10/10/17. Pt has been advised about the importance in completing this exam.   Interpreter Needed?: No  Information entered by :: Kirke Shaggy, LPN   Activities of Daily Living    09/04/2022    8:26 AM  In your present state of health, do you have any difficulty performing the following activities:  Hearing? 0  Vision? 0  Difficulty concentrating or making decisions? 0  Walking or climbing stairs? 1  Dressing or bathing? 0  Doing errands, shopping? 0  Preparing Food and eating ? N  Using the Toilet? N  In the past six months, have you accidently leaked urine? N  Do you have problems with loss of bowel control? N  Managing your Medications? N  Managing your  Finances? N  Housekeeping or managing your Housekeeping? N     Patient Care Team: Ria Bush, MD as PCP - General (Family Medicine) Debbora Dus, [facility] as Pharmacist (Pharmacist)  Indicate any recent Medical Services you may have received from other than [facility] providers in the past year (date may be approximate).     Assessment:   This is a routine wellness examination for [place].  Hearing/Vision screen Hearing Screening - Comments:: No aids Vision Screening - Comments:: Wears glasses- [facility]  Dietary issues and exercise activities discussed: Current Exercise Habits: The patient does not participate in regular exercise at present, Exercise limited by: orthopedic condition(s)   Goals Addressed             This Visit's Progress    DIET - EAT MORE FRUITS AND VEGETABLES         Depression Screen    09/04/2022    8:24 AM 07/04/2021   11:04 AM 03/28/2020    2:25 PM 03/01/2019    3:59 PM 02/13/2018    2:54 PM 10/10/2017    3:23 PM 08/09/2016    8:40 AM  PHQ 2/9 Scores  PHQ - 2 Score 0 0 0 0 0 0 0  PHQ- 9 Score 0   0       Fall Risk    09/04/2022    8:26 AM 07/04/2021   11:01 AM 03/28/2020    2:25 PM 03/01/2019    3:59 PM 02/13/2018    2:54 PM  Fall Risk   Falls in the past year? 0 0 0 0 No  Number falls in past yr: 0 0     Injury with Fall? 0      Risk for fall due to : No Fall Risks      Follow up Falls prevention discussed;Falls evaluation completed        FALL RISK PREVENTION PERTAINING TO THE HOME:  Any stairs in or around the home? Yes  If so, are there any without handrails? No  Home free of loose throw rugs in walkways, pet beds, electrical cords, etc? Yes  Adequate lighting in your home to reduce risk of falls? Yes   ASSISTIVE DEVICES UTILIZED TO PREVENT FALLS:  Life alert? No  Use of a cane, walker or w/c? No  Grab bars in the bathroom? No  Shower chair or bench in shower? No  Elevated toilet seat or a handicapped toilet? Yes    Cognitive Function:    03/01/2019    3:58 PM 08/09/2016    8:45 AM   MMSE - Mini Mental State Exam  Orientation to time 5 5  Orientation to Place 5 5  Registration 3 3  Attention/ Calculation 0 0  Recall 3 3  Language- name 2 objects 0 0  Language- repeat 1 1  Language- follow 3 step command 0 3  Language- read & follow direction 0 0  Write a sentence 0 0  Copy design 0 0  Total score 17 20        09/04/2022    8:30 AM  6CIT Screen  What Year? 0 points  What month? 0 points  What time? 0 points  Count back from 20 0 points  Months in reverse 0 points  Repeat phrase 0 points  Total Score 0 points    Immunizations Immunization History  Administered Date(s) Administered   Fluad Quad(high Dose 65+) 08/12/2019, 07/04/2021   Influenza,inj,Quad PF,6+ Mos 10/10/2017, 07/15/2018   Influenza-Unspecified 07/18/2015, 07/19/2020  PFIZER(Purple Top)SARS-COV-2 Vaccination 11/17/2019, 12/08/2019, 08/28/2020   Pneumococcal Conjugate-13 08/09/2016   Pneumococcal Polysaccharide-23 07/10/2012   Td 10/28/2010   Zoster, Live 10/28/2009    TDAP status: Due, Education has been provided regarding the importance of this vaccine. Advised may receive this vaccine at local pharmacy or Health Dept. Aware to provide a copy of the vaccination record if obtained from local pharmacy or Health Dept. Verbalized acceptance and understanding.  Flu Vaccine status: Up to date  Pneumococcal vaccine status: Up to date  Covid-19 vaccine status: Completed vaccines  Qualifies for Shingles Vaccine? Yes   Zostavax completed Yes   Shingrix Completed?: No.    Education has been provided regarding the importance of this vaccine. Patient has been advised to call insurance company to determine out of pocket expense if they have not yet received this vaccine. Advised may also receive vaccine at local pharmacy or Health Dept. Verbalized acceptance and understanding.  Screening Tests Health Maintenance  Topic Date Due   Zoster Vaccines- Shingrix (1 of 2) Never done   FOOT EXAM   10/10/2018   COVID-19 Vaccine (4 - Pfizer risk series) 10/23/2020   TETANUS/TDAP  06/16/2021   HEMOGLOBIN A1C  12/26/2021   INFLUENZA VACCINE  05/28/2022   Diabetic kidney evaluation - GFR measurement  06/28/2022   Diabetic kidney evaluation - Urine ACR  06/28/2022   OPHTHALMOLOGY EXAM  03/01/2023   Medicare Annual Wellness (AWV)  09/05/2023   COLONOSCOPY (Pts 45-4yr Insurance coverage will need to be confirmed)  09/26/2026   Pneumonia Vaccine 76 Years old  Completed   Hepatitis C Screening  Completed   HPV VACCINES  Aged Out    Health Maintenance  Health Maintenance Due  Topic Date Due   Zoster Vaccines- Shingrix (1 of 2) Never done   FOOT EXAM  10/10/2018   COVID-19 Vaccine (4 - Pfizer risk series) 10/23/2020   TETANUS/TDAP  06/16/2021   HEMOGLOBIN A1C  12/26/2021   INFLUENZA VACCINE  05/28/2022   Diabetic kidney evaluation - GFR measurement  06/28/2022   Diabetic kidney evaluation - Urine ACR  06/28/2022    Colorectal cancer screening: Type of screening: Colonoscopy. Completed 09/26/21. Repeat every 5 years  Lung Cancer Screening: (Low Dose CT Chest recommended if Age 77-80years, 30 pack-year currently smoking OR have quit w/in 15years.) does not qualify.   Additional Screening:  Hepatitis C Screening: does qualify; Completed 07/13/15  Vision Screening: Recommended annual ophthalmology exams for early detection of glaucoma and other disorders of the eye. Is the patient up to date with their annual eye exam?  Yes  Who is the provider or what is the name of the office in which the patient attends annual eye exams[place] is not established with a provider, would they like to be referred to a provider to establish care? No .   Dental Screening: Recommended annual dental exams for proper oral hygiene  Community Resource Referral / Chronic Care Management: CRR required this visit?  No   CCM required this visit?  No      Plan:     I have personally  reviewed and noted the following in the patient's chart:   Medical and social history Use of alcohol, tobacco or illicit drugs  Current medications and supplements including opioid prescriptions. Patient is not currently taking opioid prescriptions. Functional ability and status Nutritional status Physical activity Advanced directives List of other physicians Hospitalizations, surgeries, and ER visits in previous 12 months Vitals Screenings to include  cognitive, depression, and falls Referrals and appointments  In addition, I have reviewed and discussed with patient certain preventive protocols, quality metrics, and best practice recommendations. A written personalized care plan for preventive services as well as general preventive health recommendations were provided to patient.     Thomas David, LPN   06/02/7618   Nurse Notes: none

## 2022-09-04 NOTE — Patient Instructions (Signed)
[name] , Thank you for taking time to come for your Medicare Wellness Visit. I appreciate your ongoing commitment to your health goals. Please review the following plan we discussed and let me know if I can assist you in the future.   Screening recommendations/referrals: Colonoscopy: 09/26/21 Recommended yearly ophthalmology/optometry visit for glaucoma screening and checkup Recommended yearly dental visit for hygiene and checkup  Vaccinations: Influenza vaccine: 07/04/21 Pneumococcal vaccine: 08/09/16 Tdap vaccine: 06/17/11, due if have injury Shingles vaccine: Zostavax 08/01/10   Covid-19: 11/17/19, 12/08/19, 08/28/20  Advanced directives: no  Conditions/risks identified: none  Next appointment: Follow up in one year for your annual wellness visit. 09/08/23 @ 8:15 am by phone  Preventive Care 65 Years and Older, Male Preventive care refers to lifestyle choices and visits with your health care provider that can promote health and wellness. What does preventive care include? A yearly physical exam. This is also called an annual well check. Dental exams once or twice a year. Routine eye exams. Ask your health care provider how often you should have your eyes checked. Personal lifestyle choices, including: Daily care of your teeth and gums. Regular physical activity. Eating a healthy diet. Avoiding tobacco and drug use. Limiting alcohol use. Practicing safe sex. Taking low doses of aspirin every day. Taking vitamin and mineral supplements as recommended by your health care provider. What happens during an annual well check? The services and screenings done by your health care provider during your annual well check will depend on your age, overall health, lifestyle risk factors, and family history of disease. Counseling  Your health care provider may ask you questions about your: Alcohol use. Tobacco use. Drug use. Emotional well-being. Home and relationship well-being. Sexual  activity. Eating habits. History of falls. Memory and ability to understand (cognition). Work and work Statistician. Screening  You may have the following tests or measurements: Height, weight, and BMI. Blood pressure. Lipid and cholesterol levels. These may be checked every 5 years, or more frequently if you are over 67 years old. Skin check. Lung cancer screening. You may have this screening every year starting at age 15 if you have a 30-pack-year history of smoking and currently smoke or have quit within the past 15 years. Fecal occult blood test (FOBT) of the stool. You may have this test every year starting at age 23. Flexible sigmoidoscopy or colonoscopy. You may have a sigmoidoscopy every 5 years or a colonoscopy every 10 years starting at age 23. Prostate cancer screening. Recommendations will vary depending on your family history and other risks. Hepatitis C blood test. Hepatitis B blood test. Sexually transmitted disease (STD) testing. Diabetes screening. This is done by checking your blood sugar (glucose) after you have not eaten for a while (fasting). You may have this done every 1-3 years. Abdominal aortic aneurysm (AAA) screening. You may need this if you are a current or former smoker. Osteoporosis. You may be screened starting at age 25 if you are at high risk. Talk with your health care provider about your test results, treatment options, and if necessary, the need for more tests. Vaccines  Your health care provider may recommend certain vaccines, such as: Influenza vaccine. This is recommended every year. Tetanus, diphtheria, and acellular pertussis (Tdap, Td) vaccine. You may need a Td booster every 10 years. Zoster vaccine. You may need this after age 61. Pneumococcal 13-valent conjugate (PCV13) vaccine. One dose is recommended after age 105. Pneumococcal polysaccharide (PPSV23) vaccine. One dose is recommended after age 42.  Talk to your health care provider about which  screenings and vaccines you need and how often you need them. This information is not intended to replace advice given to you by your health care provider. Make sure you discuss any questions you have with your health care provider. Document Released: 11/10/2015 Document Revised: 07/03/2016 Document Reviewed: 08/15/2015 Elsevier Interactive Patient Education  2017 [place] Prevention in the Home Falls can cause injuries. They can happen to people of all ages. There are many things you can do to make your home safe and to help prevent falls. What can I do on the outside of my home? Regularly fix the edges of walkways and driveways and fix any cracks. Remove anything that might make you trip as you walk through a door, such as a raised step or threshold. Trim any bushes or trees on the path to your home. Use bright outdoor lighting. Clear any walking paths of anything that might make someone trip, such as rocks or tools. Regularly check to see if handrails are loose or broken. Make sure that both sides of any steps have handrails. Any raised decks and porches should have guardrails on the edges. Have any leaves, snow, or ice cleared regularly. Use sand or salt on walking paths during winter. Clean up any spills in your garage right away. This includes oil or grease spills. What can I do in the bathroom? Use night lights. Install grab bars by the toilet and in the tub and shower. Do not use towel bars as grab bars. Use non-skid mats or decals in the tub or shower. If you need to sit down in the shower, use a plastic, non-slip stool. Keep the floor dry. Clean up any water that spills on the floor as soon as it happens. Remove soap buildup in the tub or shower regularly. Attach bath mats securely with double-sided non-slip rug tape. Do not have throw rugs and other things on the floor that can make you trip. What can I do in the bedroom? Use night lights. Make sure that you have a  light by your bed that is easy to reach. Do not use any sheets or blankets that are too big for your bed. They should not hang down onto the floor. Have a firm chair that has side arms. You can use this for support while you get dressed. Do not have throw rugs and other things on the floor that can make you trip. What can I do in the kitchen? Clean up any spills right away. Avoid walking on wet floors. Keep items that you use a lot in easy-to-reach places. If you need to reach something above you, use a strong step stool that has a grab bar. Keep electrical cords out of the way. Do not use floor polish or wax that makes floors slippery. If you must use wax, use non-skid floor wax. Do not have throw rugs and other things on the floor that can make you trip. What can I do with my stairs? Do not leave any items on the stairs. Make sure that there are handrails on both sides of the stairs and use them. Fix handrails that are broken or loose. Make sure that handrails are as long as the stairways. Check any carpeting to make sure that it is firmly attached to the stairs. Fix any carpet that is loose or worn. Avoid having throw rugs at the top or bottom of the stairs. If you do have throw  rugs, attach them to the floor with carpet tape. Make sure that you have a light switch at the top of the stairs and the bottom of the stairs. If you do not have them, ask someone to add them for you. What else can I do to help prevent falls? Wear shoes that: Do not have high heels. Have rubber bottoms. Are comfortable and fit you well. Are closed at the toe. Do not wear sandals. If you use a stepladder: Make sure that it is fully opened. Do not climb a closed stepladder. Make sure that both sides of the stepladder are locked into place. Ask someone to hold it for you, if possible. Clearly mark and make sure that you can see: Any grab bars or handrails. First and last steps. Where the edge of each step  is. Use tools that help you move around (mobility aids) if they are needed. These include: Canes. Walkers. Scooters. Crutches. Turn on the lights when you go into a dark area. Replace any light bulbs as soon as they burn out. Set up your furniture so you have a clear path. Avoid moving your furniture around. If any of your floors are uneven, fix them. If there are any pets around you, be aware of where they are. Review your medicines with your doctor. Some medicines can make you feel dizzy. This can increase your chance of falling. Ask your doctor what other things that you can do to help prevent falls. This information is not intended to replace advice given to you by your health care provider. Make sure you discuss any questions you have with your health care provider. Document Released: 08/10/2009 Document Revised: 03/21/2016 Document Reviewed: 11/18/2014 Elsevier Interactive Patient Education  2017 [employer].

## 2022-09-09 ENCOUNTER — Other Ambulatory Visit: Payer: Self-pay | Admitting: Family Medicine

## 2022-09-09 DIAGNOSIS — N400 Enlarged prostate without lower urinary tract symptoms: Secondary | ICD-10-CM

## 2022-09-09 DIAGNOSIS — E559 Vitamin D deficiency, unspecified: Secondary | ICD-10-CM

## 2022-09-09 DIAGNOSIS — E1169 Type 2 diabetes mellitus with other specified complication: Secondary | ICD-10-CM

## 2022-09-09 DIAGNOSIS — E1122 Type 2 diabetes mellitus with diabetic chronic kidney disease: Secondary | ICD-10-CM

## 2022-09-09 DIAGNOSIS — E118 Type 2 diabetes mellitus with unspecified complications: Secondary | ICD-10-CM

## 2022-09-09 DIAGNOSIS — E538 Deficiency of other specified B group vitamins: Secondary | ICD-10-CM

## 2022-09-09 DIAGNOSIS — M1A29X0 Drug-induced chronic gout, multiple sites, without tophus (tophi): Secondary | ICD-10-CM

## 2022-09-11 ENCOUNTER — Other Ambulatory Visit (INDEPENDENT_AMBULATORY_CARE_PROVIDER_SITE_OTHER): Payer: Medicare HMO

## 2022-09-11 DIAGNOSIS — M1A29X0 Drug-induced chronic gout, multiple sites, without tophus (tophi): Secondary | ICD-10-CM | POA: Diagnosis not present

## 2022-09-11 DIAGNOSIS — E785 Hyperlipidemia, unspecified: Secondary | ICD-10-CM

## 2022-09-11 DIAGNOSIS — E1122 Type 2 diabetes mellitus with diabetic chronic kidney disease: Secondary | ICD-10-CM

## 2022-09-11 DIAGNOSIS — E118 Type 2 diabetes mellitus with unspecified complications: Secondary | ICD-10-CM

## 2022-09-11 DIAGNOSIS — E538 Deficiency of other specified B group vitamins: Secondary | ICD-10-CM | POA: Diagnosis not present

## 2022-09-11 DIAGNOSIS — E1169 Type 2 diabetes mellitus with other specified complication: Secondary | ICD-10-CM

## 2022-09-11 DIAGNOSIS — N183 Chronic kidney disease, stage 3 unspecified: Secondary | ICD-10-CM | POA: Diagnosis not present

## 2022-09-11 DIAGNOSIS — N400 Enlarged prostate without lower urinary tract symptoms: Secondary | ICD-10-CM

## 2022-09-11 DIAGNOSIS — E559 Vitamin D deficiency, unspecified: Secondary | ICD-10-CM | POA: Diagnosis not present

## 2022-09-11 LAB — CBC WITH DIFFERENTIAL/PLATELET
Basophils Absolute: 0 10*3/uL (ref 0.0–0.1)
Basophils Relative: 0.7 % (ref 0.0–3.0)
Eosinophils Absolute: 0.1 10*3/uL (ref 0.0–0.7)
Eosinophils Relative: 2.7 % (ref 0.0–5.0)
HCT: 41.6 % (ref 39.0–52.0)
Hemoglobin: 14.6 g/dL (ref 13.0–17.0)
Lymphocytes Relative: 39.9 % (ref 12.0–46.0)
Lymphs Abs: 2 10*3/uL (ref 0.7–4.0)
MCHC: 35 g/dL (ref 30.0–36.0)
MCV: 94.6 fl (ref 78.0–100.0)
Monocytes Absolute: 0.7 10*3/uL (ref 0.1–1.0)
Monocytes Relative: 15 % — ABNORMAL HIGH (ref 3.0–12.0)
Neutro Abs: 2.1 10*3/uL (ref 1.4–7.7)
Neutrophils Relative %: 41.7 % — ABNORMAL LOW (ref 43.0–77.0)
Platelets: 241 10*3/uL (ref 150.0–400.0)
RBC: 4.4 Mil/uL (ref 4.22–5.81)
RDW: 14.8 % (ref 11.5–15.5)
WBC: 5 10*3/uL (ref 4.0–10.5)

## 2022-09-11 LAB — MICROALBUMIN / CREATININE URINE RATIO
Creatinine,U: 142.8 mg/dL
Microalb Creat Ratio: 0.7 mg/g (ref 0.0–30.0)
Microalb, Ur: 1 mg/dL (ref 0.0–1.9)

## 2022-09-11 LAB — COMPREHENSIVE METABOLIC PANEL
ALT: 16 U/L (ref 0–53)
AST: 21 U/L (ref 0–37)
Albumin: 4 g/dL (ref 3.5–5.2)
Alkaline Phosphatase: 65 U/L (ref 39–117)
BUN: 33 mg/dL — ABNORMAL HIGH (ref 6–23)
CO2: 29 mEq/L (ref 19–32)
Calcium: 9 mg/dL (ref 8.4–10.5)
Chloride: 101 mEq/L (ref 96–112)
Creatinine, Ser: 1.59 mg/dL — ABNORMAL HIGH (ref 0.40–1.50)
GFR: 41.67 mL/min — ABNORMAL LOW (ref >60.00)
Glucose, Bld: 144 mg/dL — ABNORMAL HIGH (ref 70–99)
Potassium: 3.3 mEq/L — ABNORMAL LOW (ref 3.5–5.1)
Sodium: 143 mEq/L (ref 135–145)
Total Bilirubin: 0.6 mg/dL (ref 0.2–1.2)
Total Protein: 6.1 g/dL (ref 6.0–8.3)

## 2022-09-11 LAB — LIPID PANEL
Cholesterol: 134 mg/dL (ref 0–200)
HDL: 33.7 mg/dL — ABNORMAL LOW (ref >39.00)
LDL Cholesterol: 68 mg/dL (ref 0–99)
NonHDL: 100.4
Total CHOL/HDL Ratio: 4
Triglycerides: 161 mg/dL — ABNORMAL HIGH (ref 0.0–149.0)
VLDL: 32.2 mg/dL (ref 0.0–40.0)

## 2022-09-11 LAB — HEMOGLOBIN A1C: Hgb A1c MFr Bld: 7.9 % — ABNORMAL HIGH (ref 4.6–6.5)

## 2022-09-11 LAB — URIC ACID: Uric Acid, Serum: 8.6 mg/dL — ABNORMAL HIGH (ref 4.0–7.8)

## 2022-09-11 LAB — PSA: PSA: 0.1 ng/mL (ref 0.10–4.00)

## 2022-09-11 LAB — VITAMIN B12: Vitamin B-12: 621 pg/mL (ref 211–911)

## 2022-09-11 LAB — VITAMIN D 25 HYDROXY (VIT D DEFICIENCY, FRACTURES): VITD: 26.74 ng/mL — ABNORMAL LOW (ref 30.00–100.00)

## 2022-09-12 LAB — PARATHYROID HORMONE, INTACT (NO CA): PTH: 41 pg/mL (ref 16–77)

## 2022-09-18 ENCOUNTER — Ambulatory Visit (INDEPENDENT_AMBULATORY_CARE_PROVIDER_SITE_OTHER): Payer: Medicare HMO | Admitting: Family Medicine

## 2022-09-18 ENCOUNTER — Encounter: Payer: Self-pay | Admitting: Family Medicine

## 2022-09-18 VITALS — BP 120/62 | HR 62 | Ht 69.0 in | Wt 233.0 lb

## 2022-09-18 DIAGNOSIS — G9389 Other specified disorders of brain: Secondary | ICD-10-CM | POA: Diagnosis not present

## 2022-09-18 DIAGNOSIS — I358 Other nonrheumatic aortic valve disorders: Secondary | ICD-10-CM | POA: Diagnosis not present

## 2022-09-18 DIAGNOSIS — I2510 Atherosclerotic heart disease of native coronary artery without angina pectoris: Secondary | ICD-10-CM | POA: Diagnosis not present

## 2022-09-18 DIAGNOSIS — Z7189 Other specified counseling: Secondary | ICD-10-CM | POA: Diagnosis not present

## 2022-09-18 DIAGNOSIS — N400 Enlarged prostate without lower urinary tract symptoms: Secondary | ICD-10-CM

## 2022-09-18 DIAGNOSIS — I10 Essential (primary) hypertension: Secondary | ICD-10-CM

## 2022-09-18 DIAGNOSIS — A6002 Herpesviral infection of other male genital organs: Secondary | ICD-10-CM

## 2022-09-18 DIAGNOSIS — Z0000 Encounter for general adult medical examination without abnormal findings: Secondary | ICD-10-CM | POA: Diagnosis not present

## 2022-09-18 DIAGNOSIS — E785 Hyperlipidemia, unspecified: Secondary | ICD-10-CM

## 2022-09-18 DIAGNOSIS — E1122 Type 2 diabetes mellitus with diabetic chronic kidney disease: Secondary | ICD-10-CM | POA: Diagnosis not present

## 2022-09-18 DIAGNOSIS — E559 Vitamin D deficiency, unspecified: Secondary | ICD-10-CM

## 2022-09-18 DIAGNOSIS — M1A29X0 Drug-induced chronic gout, multiple sites, without tophus (tophi): Secondary | ICD-10-CM

## 2022-09-18 DIAGNOSIS — N183 Chronic kidney disease, stage 3 unspecified: Secondary | ICD-10-CM

## 2022-09-18 DIAGNOSIS — R011 Cardiac murmur, unspecified: Secondary | ICD-10-CM

## 2022-09-18 DIAGNOSIS — E1169 Type 2 diabetes mellitus with other specified complication: Secondary | ICD-10-CM | POA: Diagnosis not present

## 2022-09-18 DIAGNOSIS — E669 Obesity, unspecified: Secondary | ICD-10-CM

## 2022-09-18 DIAGNOSIS — I350 Nonrheumatic aortic (valve) stenosis: Secondary | ICD-10-CM | POA: Insufficient documentation

## 2022-09-18 MED ORDER — ATENOLOL-CHLORTHALIDONE 50-25 MG PO TABS: ORAL_TABLET | ORAL | 3 refills | Status: DC

## 2022-09-18 MED ORDER — ALLOPURINOL 100 MG PO TABS: 200.0000 mg | ORAL_TABLET | Freq: Every day | ORAL | 3 refills | Status: DC

## 2022-09-18 MED ORDER — METFORMIN HCL ER 500 MG PO TB24: 500.0000 mg | ORAL_TABLET | Freq: Every day | ORAL | 3 refills | Status: DC

## 2022-09-18 MED ORDER — POTASSIUM 99 MG PO TABS: 1.0000 | ORAL_TABLET | Freq: Every day | ORAL | 0 refills | Status: DC

## 2022-09-18 MED ORDER — LOVASTATIN 40 MG PO TABS: 80.0000 mg | ORAL_TABLET | Freq: Every day | ORAL | 3 refills | Status: DC

## 2022-09-18 MED ORDER — VITAMIN D3 25 MCG (1000 UT) PO CAPS: 1.0000 | ORAL_CAPSULE | Freq: Every day | ORAL | Status: AC

## 2022-09-18 MED ORDER — ACYCLOVIR 400 MG PO TABS: 400.0000 mg | ORAL_TABLET | Freq: Every day | ORAL | 3 refills | Status: DC

## 2022-09-18 NOTE — Assessment & Plan Note (Signed)
Continue vit D 1000 IU daily. 

## 2022-09-18 NOTE — Assessment & Plan Note (Signed)
Advanced directive - continues working on living will at home. HCPOA - wife.

## 2022-09-18 NOTE — Assessment & Plan Note (Addendum)
Stable period - PSA remains normal.

## 2022-09-18 NOTE — Assessment & Plan Note (Signed)
On aspirin, statin.  H/o LAD stent 2000s ([facility]).  Has not recently seen cardiology. No cardiac symptoms at this time.

## 2022-09-18 NOTE — Assessment & Plan Note (Signed)
Preventative protocols reviewed and updated unless pt declined. Discussed healthy diet and lifestyle.  

## 2022-09-18 NOTE — Assessment & Plan Note (Signed)
Upon further evaluation, pt does not have history of stroke.  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

## 2022-09-18 NOTE — Assessment & Plan Note (Signed)
Discussed weight gain noted. Encouraged continued efforts towards sustainable weight loss.

## 2022-09-18 NOTE — Assessment & Plan Note (Addendum)
Latest echo 2017 showing mild AR/MR as well as aortic valve sclerosis.  Murmur sounds louder - will update echocardiogram.

## 2022-09-18 NOTE — Assessment & Plan Note (Signed)
Reviewed slow deterioration over the past several years.  Microalbumin normal, BP well controlled. Will work towards better sugar control. Recheck 4 months.

## 2022-09-18 NOTE — Assessment & Plan Note (Signed)
Chronic, stable period without gout flare however urate levels are rising.  Will not change allopurinol dosing at this time.

## 2022-09-18 NOTE — Assessment & Plan Note (Signed)
Chronic, LDL at goal on high dose lovastatin - continue. The 10-year ASCVD risk score (Arnett DK, et al., 2019) is: 48.5%   Values used to calculate the score:     Age: 76 years     Sex: Male     Is Non-Hispanic African American: No     Diabetic: Yes     Tobacco smoker: No     Systolic Blood Pressure: 539 mmHg     Is BP treated: Yes     HDL Cholesterol: 33.7 mg/dL     Total Cholesterol: 134 mg/dL

## 2022-09-18 NOTE — Assessment & Plan Note (Signed)
Deteriorated control in setting of weight gain.  Restart metformin XR '500mg'$  once daily. RTC 4 mo DM f/u visit

## 2022-09-18 NOTE — Assessment & Plan Note (Addendum)
Chronic, stable on atenolol/chlorthalidone. Low K - rec increased potassium in diet and recommended he start OTC potassium supplementation '99mg'$ . Reassess at f/u visit. Consider ACEI/ARB in diabetic.

## 2022-09-18 NOTE — Patient Instructions (Addendum)
If interested, check with pharmacy about new 2 shot shingles series (shingrix). Restart metformin XR '500mg'$  daily for better sugar control, work on low sugar low carb diet.  Keep an eye on sugar levels, goal fasting 80-120, goal 2 hours after meal <180.  I will order heart ultrasound to get done at [facility].  Return in 4 months for diabetes and kidney follow up.   Health Maintenance After Age 77 After age 51, you are at a higher risk for certain long-term diseases and infections as well as injuries from falls. Falls are a major cause of broken bones and head injuries in people who are older than age 57. Getting regular preventive care can help to keep you healthy and well. Preventive care includes getting regular testing and making lifestyle changes as recommended by your health care provider. Talk with your health care provider about: Which screenings and tests you should have. A screening is a test that checks for a disease when you have no symptoms. A diet and exercise plan that is right for you. What should I know about screenings and tests to prevent falls? Screening and testing are the best ways to find a health problem early. Early diagnosis and treatment give you the best chance of managing medical conditions that are common after age 77. Certain conditions and lifestyle choices may make you more likely to have a fall. Your health care provider may recommend: Regular vision checks. Poor vision and conditions such as cataracts can make you more likely to have a fall. If you wear glasses, make sure to get your prescription updated if your vision changes. Medicine review. Work with your health care provider to regularly review all of the medicines you are taking, including over-the-counter medicines. Ask your health care provider about any side effects that may make you more likely to have a fall. Tell your health care provider if any medicines that you take make you feel dizzy or  sleepy. Strength and balance checks. Your health care provider may recommend certain tests to check your strength and balance while standing, walking, or changing positions. Foot health exam. Foot pain and numbness, as well as not wearing proper footwear, can make you more likely to have a fall. Screenings, including: Osteoporosis screening. Osteoporosis is a condition that causes the bones to get weaker and break more easily. Blood pressure screening. Blood pressure changes and medicines to control blood pressure can make you feel dizzy. Depression screening. You may be more likely to have a fall if you have a fear of falling, feel depressed, or feel unable to do activities that you used to do. Alcohol use screening. Using too much alcohol can affect your balance and may make you more likely to have a fall. Follow these instructions at home: Lifestyle Do not drink alcohol if: Your health care provider tells you not to drink. If you drink alcohol: Limit how much you have to: 0-1 drink a day for women. 0-2 drinks a day for men. Know how much alcohol is in your drink. In the [place] one drink equals one 12 oz bottle of beer (355 mL), one 5 oz glass of wine (148 mL), or one 1 oz glass of hard liquor (44 mL). Do not use any products that contain nicotine or tobacco. These products include cigarettes, chewing tobacco, and vaping devices, such as e-cigarettes. If you need help quitting, ask your health care provider. Activity  Follow a regular exercise program to stay fit. This will help you maintain  your balance. Ask your health care provider what types of exercise are appropriate for you. If you need a cane or walker, use it as recommended by your health care provider. Wear supportive shoes that have nonskid soles. Safety  Remove any tripping hazards, such as rugs, cords, and clutter. Install safety equipment such as grab bars in bathrooms and safety rails on stairs. Keep rooms and walkways  well-lit. General instructions Talk with your health care provider about your risks for falling. Tell your health care provider if: You fall. Be sure to tell your health care provider about all falls, even ones that seem minor. You feel dizzy, tiredness (fatigue), or off-balance. Take over-the-counter and prescription medicines only as told by your health care provider. These include supplements. Eat a healthy diet and maintain a healthy weight. A healthy diet includes low-fat dairy products, low-fat (lean) meats, and fiber from whole grains, beans, and lots of fruits and vegetables. Stay current with your vaccines. Schedule regular health, dental, and eye exams. Summary Having a healthy lifestyle and getting preventive care can help to protect your health and wellness after age 7. Screening and testing are the best way to find a health problem early and help you avoid having a fall. Early diagnosis and treatment give you the best chance for managing medical conditions that are more common for people who are older than age 36. Falls are a major cause of broken bones and head injuries in people who are older than age 93. Take precautions to prevent a fall at home. Work with your health care provider to learn what changes you can make to improve your health and wellness and to prevent falls. This information is not intended to replace advice given to you by your health care provider. Make sure you discuss any questions you have with your health care provider. Document Revised: 03/05/2021 Document Reviewed: 03/05/2021 Elsevier Patient Education  [employer].

## 2022-09-18 NOTE — Progress Notes (Signed)
Patient ID: Thomas Carlson, male    DOB: Nov 18, 1945, 76 y.o.   MRN: [id number]  This visit was conducted in person.  BP 120/62   Pulse 62   Ht '5\' 9"'$  (1.753 m)   Wt 233 lb (105.7 kg)   SpO2 95%   BMI 34.41 kg/m    CC: CPE Subjective:   HPI: Thomas Carlson is a 76 y.o. male presenting on 09/18/2022 for Annual Exam   Saw health advisor earlier this month for medicare wellness visit. Note reviewed.    Hearing Screening   '500Hz'$  '1000Hz'$  '2000Hz'$  '4000Hz'$   Right ear 40 40 40 40  Left ear 40 40 40 40   Vision Screening   Right eye Left eye Both eyes  Without correction     With correction '20/20 20/20 20/20 '$    Flowsheet Row Office Visit from 09/18/2022 in [place] at [facility] Total Score 0          09/04/2022    8:26 AM 07/04/2021   11:01 AM 03/28/2020    2:25 PM 03/01/2019    3:59 PM 02/13/2018    2:54 PM  Fall Risk   Falls in the past year? 0 0 0 0 No  Number falls in past yr: 0 0     Injury with Fall? 0      Risk for fall due to : No Fall Risks      Follow up Falls prevention discussed;Falls evaluation completed        Saw [facility] for low IgG and IgM and h/o lymphocytic colitis that only responded to [place] - stable evaluation.    H/o brain surgery craniotomy age 12yo after baseball hit in head with resultant brain hematoma.   On acyclovir '400mg'$  daily for years   Preventative: COLONOSCOPY WITH PROPOFOL 11/10/2015; diverticulosis, biopsy showed microscopic colitis [name], MD)  COLONOSCOPY 08/26/2018 - TA, rpt 3 yrs ([place], Tally Due)  COLONOSCOPY WITH PROPOFOL 09/26/2021 - diverticulosis, rpt 5 yrs ([place]anga, Tally Due, MD) Prostate - yearly. Nocturia x2. No stream weakening Lung cancer screening - not eligible  Flu shot yearly  [place] 10/2019, 11/2019, booster x2 08/2020,  07/2022 Pneumovax 06/2012, prevnar-13 07/2016  Td - 2012  zostavax 2011  shingrix - discussed, to consider Advanced directive - continues  working on living will at home. HCPOA - wife.  Seat belt use discuss  Sunscreen use discussed, no changing moles on skin. sees derm.  Sleep - averages 5-6 hours of sleep Non smoker Alcohol - none  Dentist every few years  Eye exam yearly  Bowel - no constipation, ongoing diarrhea Bladder - no incontinence    Caffeine: 2 cans of diet soda/day   Lives with wife. 1 dog at home. 2 grown children   Occupation: Advertising copywriter at [employer], current [employer] 2015 Edu: Master's degree   Activity: walking (fit bit), limited by R knee  Diet: some water, lots of diet sodas, fruits/vegetables occasionally, cutting back on carbs     Relevant past medical, surgical, family and social history reviewed and updated as indicated. Interim medical history since our last visit reviewed. Allergies and medications reviewed and updated. Outpatient Medications Prior to Visit  Medication Sig Dispense Refill   aspirin EC 81 MG tablet Take 1 tablet (81 mg total) by mouth daily.     Biotin 1000 MCG tablet Take 1,000 mcg by mouth daily.      Cyanocobalamin (B-12) 1000  MCG SUBL Place 1 tablet under the tongue daily. (Patient taking differently: Take 1 tablet by mouth daily.)     Magnesium 500 MG TABS Take 500 mg by mouth daily.     tacrolimus (PROTOPIC) 0.03 % ointment Apply topically 2 (two) times daily. 60 g 0   acyclovir (ZOVIRAX) 400 MG tablet TAKE 1 TABLET (400 MG TOTAL) BY MOUTH DAILY. 90 tablet 0   allopurinol (ZYLOPRIM) 100 MG tablet TAKE 2 TABLETS (200 MG TOTAL) BY MOUTH DAILY. 180 tablet 0   atenolol-chlorthalidone (TENORETIC) 50-25 MG tablet TAKE 1 TABLET EVERY DAY (TO REPLACE ATENOLOL) 90 tablet 0   lovastatin (MEVACOR) 40 MG tablet TAKE 2 TABLETS (80 MG TOTAL) BY MOUTH AT BEDTIME. 180 tablet 0   acetaminophen (TYLENOL) 500 MG tablet Take 1,000 mg by mouth every 4 (four) hours as needed for fever or pain. Not to exceed 4000 mg daily     tiZANidine (ZANAFLEX) 4 MG tablet Take 4 mg by mouth  at bedtime.     No facility-administered medications prior to visit.     Per HPI unless specifically indicated in ROS section below Review of Systems  Constitutional:  Positive for fever. Negative for activity change, appetite change, chills, fatigue and unexpected weight change.  HENT:  Negative for hearing loss.   Eyes:  Negative for visual disturbance.  Respiratory:  Negative for cough, chest tightness, shortness of breath and wheezing.   Cardiovascular:  Negative for chest pain, palpitations and leg swelling.  Gastrointestinal:  Positive for diarrhea, nausea and vomiting. Negative for abdominal distention, abdominal pain, blood in stool and constipation.  Genitourinary:  Negative for difficulty urinating and hematuria.  Musculoskeletal:  Negative for arthralgias, myalgias and neck pain.  Skin:  Negative for rash.  Neurological:  Negative for dizziness, seizures, syncope and headaches.  Hematological:  Negative for adenopathy. Does not bruise/bleed easily.  Psychiatric/Behavioral:  Negative for dysphoric mood. The patient is not nervous/anxious.     Objective:  BP 120/62   Pulse 62   Ht '5\' 9"'$  (1.753 m)   Wt 233 lb (105.7 kg)   SpO2 95%   BMI 34.41 kg/m   Wt Readings from Last 3 Encounters:  09/18/22 233 lb (105.7 kg)  09/04/22 225 lb (102.1 kg)  09/26/21 225 lb (102.1 kg)      Physical Exam Vitals and nursing note reviewed.  Constitutional:      General: He is not in acute distress.    Appearance: Normal appearance. He is well-developed. He is not ill-appearing.  HENT:     Head: Normocephalic and atraumatic.     Right Ear: Hearing, tympanic membrane, ear canal and external ear normal.     Left Ear: Hearing, tympanic membrane, ear canal and external ear normal.     Mouth/Throat:     Comments: Wearing mask Eyes:     General: No scleral icterus.    Extraocular Movements: Extraocular movements intact.     Conjunctiva/sclera: Conjunctivae normal.     Pupils: Pupils are  equal, round, and reactive to light.  Neck:     Thyroid: No thyroid mass or thyromegaly.     Vascular: No carotid bruit.  Cardiovascular:     Rate and Rhythm: Normal rate and regular rhythm.     Pulses: Normal pulses.          Radial pulses are 2+ on the right side and 2+ on the left side.     Heart sounds: Murmur (3/6 systolic throughout) heard.  Pulmonary:  Effort: Pulmonary effort is normal. No respiratory distress.     Breath sounds: Normal breath sounds. No wheezing, rhonchi or rales.  Abdominal:     General: Bowel sounds are normal. There is no distension.     Palpations: Abdomen is soft. There is no mass.     Tenderness: There is no abdominal tenderness. There is no guarding or rebound.     Hernia: No hernia is present.  Musculoskeletal:        General: Normal range of motion.     Cervical back: Normal range of motion and neck supple.     Right lower leg: No edema.     Left lower leg: No edema.  Lymphadenopathy:     Cervical: No cervical adenopathy.  Skin:    General: Skin is warm and dry.     Findings: No rash.  Neurological:     General: No focal deficit present.     Mental Status: He is alert and oriented to person, place, and time.  Psychiatric:        Mood and Affect: Mood normal.        Behavior: Behavior normal.        Thought Content: Thought content normal.        Judgment: Judgment normal.       Results for orders placed or performed in visit on 09/11/22  Vitamin B12  Result Value Ref Range   Vitamin B-12 621 211 - 911 pg/mL  PSA  Result Value Ref Range   PSA 0.10 0.10 - 4.00 ng/mL  VITAMIN D 25 Hydroxy (Vit-D Deficiency, Fractures)  Result Value Ref Range   VITD 26.74 (L) 30.00 - 100.00 ng/mL  Uric acid  Result Value Ref Range   Uric Acid, Serum 8.6 (H) 4.0 - 7.8 mg/dL  Parathyroid hormone, intact (no Ca)  Result Value Ref Range   PTH 41 16 - 77 pg/mL  CBC with Differential/Platelet  Result Value Ref Range   WBC 5.0 4.0 - 10.5 K/uL   RBC  4.40 4.22 - 5.81 Mil/uL   Hemoglobin 14.6 13.0 - 17.0 g/dL   HCT 41.6 39.0 - 52.0 %   MCV 94.6 78.0 - 100.0 fl   MCHC 35.0 30.0 - 36.0 g/dL   RDW 14.8 11.5 - 15.5 %   Platelets 241.0 150.0 - 400.0 K/uL   Neutrophils Relative % 41.7 (L) 43.0 - 77.0 %   Lymphocytes Relative 39.9 12.0 - 46.0 %   Monocytes Relative 15.0 (H) 3.0 - 12.0 %   Eosinophils Relative 2.7 0.0 - 5.0 %   Basophils Relative 0.7 0.0 - 3.0 %   Neutro Abs 2.1 1.4 - 7.7 K/uL   Lymphs Abs 2.0 0.7 - 4.0 K/uL   Monocytes Absolute 0.7 0.1 - 1.0 K/uL   Eosinophils Absolute 0.1 0.0 - 0.7 K/uL   Basophils Absolute 0.0 0.0 - 0.1 K/uL  Microalbumin / creatinine urine ratio  Result Value Ref Range   Microalb, Ur 1.0 0.0 - 1.9 mg/dL   Creatinine,U 142.8 mg/dL   Microalb Creat Ratio 0.7 0.0 - 30.0 mg/g  Hemoglobin A1c  Result Value Ref Range   Hgb A1c MFr Bld 7.9 (H) 4.6 - 6.5 %  Comprehensive metabolic panel  Result Value Ref Range   Sodium 143 135 - 145 mEq/L   Potassium 3.3 (L) 3.5 - 5.1 mEq/L   Chloride 101 96 - 112 mEq/L   CO2 29 19 - 32 mEq/L   Glucose, Bld 144 (H) 70 -  99 mg/dL   BUN 33 (H) 6 - 23 mg/dL   Creatinine, Ser 1.59 (H) 0.40 - 1.50 mg/dL   Total Bilirubin 0.6 0.2 - 1.2 mg/dL   Alkaline Phosphatase 65 39 - 117 U/L   AST 21 0 - 37 U/L   ALT 16 0 - 53 U/L   Total Protein 6.1 6.0 - 8.3 g/dL   Albumin 4.0 3.5 - 5.2 g/dL   GFR 41.67 (L) >60.00 mL/min   Calcium 9.0 8.4 - 10.5 mg/dL  Lipid panel  Result Value Ref Range   Cholesterol 134 0 - 200 mg/dL   Triglycerides 161.0 (H) 0.0 - 149.0 mg/dL   HDL 33.70 (L) >39.00 mg/dL   VLDL 32.2 0.0 - 40.0 mg/dL   LDL Cholesterol 68 0 - 99 mg/dL   Total CHOL/HDL Ratio 4    NonHDL 100.40    Echocardiogram 2017 - EF 55-60%, hypokinesis of basal septal basal anteroseptal and anterior myocardium, mod calcified AV with mild AR and mild MR.   Assessment & Plan:   Problem List Items Addressed This Visit       Unprioritized   Healthcare maintenance - Primary  (Chronic)    Preventative protocols reviewed and updated unless pt declined. Discussed healthy diet and lifestyle.       Advanced care planning/counseling discussion (Chronic)    Advanced directive - continues working on living will at home. HCPOA - wife.       Type 2 diabetes mellitus with other specified complication ([place])    Deteriorated control in setting of weight gain.  Restart metformin XR '500mg'$  once daily. RTC 4 mo DM f/u visit       Relevant Medications   metFORMIN (GLUCOPHAGE-XR) 500 MG 24 hr tablet   atenolol-chlorthalidone (TENORETIC) 50-25 MG tablet   lovastatin (MEVACOR) 40 MG tablet   Hypertension    Chronic, stable on atenolol/chlorthalidone. Low K - rec increased potassium in diet and recommended he start OTC potassium supplementation '99mg'$ . Reassess at f/u visit. Consider ACEI/ARB in diabetic.       Relevant Medications   atenolol-chlorthalidone (TENORETIC) 50-25 MG tablet   lovastatin (MEVACOR) 40 MG tablet   Dyslipidemia associated with type 2 diabetes mellitus (HCC)    Chronic, LDL at goal on high dose lovastatin - continue. The 10-year ASCVD risk score (Arnett DK, et al., 2019) is: 48.5%   Values used to calculate the score:     Age: 58 years     Sex: Male     Is Non-Hispanic African American: No     Diabetic: Yes     Tobacco smoker: No     Systolic Blood Pressure: 841 mmHg     Is BP treated: Yes     HDL Cholesterol: 33.7 mg/dL     Total Cholesterol: 134 mg/dL       Relevant Medications   metFORMIN (GLUCOPHAGE-XR) 500 MG 24 hr tablet   atenolol-chlorthalidone (TENORETIC) 50-25 MG tablet   lovastatin (MEVACOR) 40 MG tablet   CAD (coronary artery disease)    On aspirin, statin.  H/o LAD stent 2000s ([place]).  Has not recently seen cardiology. No cardiac symptoms at this time.       Relevant Medications   atenolol-chlorthalidone (TENORETIC) 50-25 MG tablet   lovastatin (MEVACOR) 40 MG tablet   Obesity, Class I, BMI 30-34.9    Discussed weight  gain noted. Encouraged continued efforts towards sustainable weight loss.       CKD stage 3 due to type 2 diabetes mellitus (  [place])    Reviewed slow deterioration over the past several years.  Microalbumin normal, BP well controlled. Will work towards better sugar control. Recheck 4 months.       Relevant Medications   metFORMIN (GLUCOPHAGE-XR) 500 MG 24 hr tablet   atenolol-chlorthalidone (TENORETIC) 50-25 MG tablet   lovastatin (MEVACOR) 40 MG tablet   Vitamin D deficiency    Continue vit D 1000 IU daily.       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  Will not change allopurinol dosing at this time.       BPH without obstruction/lower urinary tract symptoms    Stable period - PSA remains normal.       Genital herpes    Well controlled with daily acyclovir '400mg'$ . H/o flare when this was stopped - will continue.      Relevant Medications   acyclovir (ZOVIRAX) 400 MG tablet   Encephalomalacia without cerebral infarction    Upon further evaluation, pt does not have history of stroke.  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       Systolic murmur    Latest echo 2017 showing mild AR/MR as well as aortic valve sclerosis.  Murmur sounds louder - will update echocardiogram.       Relevant Orders   ECHOCARDIOGRAM COMPLETE     Meds ordered this encounter  Medications   metFORMIN (GLUCOPHAGE-XR) 500 MG 24 hr tablet    Sig: Take 1 tablet (500 mg total) by mouth daily with breakfast.    Dispense:  90 tablet    Refill:  3   acyclovir (ZOVIRAX) 400 MG tablet    Sig: Take 1 tablet (400 mg total) by mouth daily.    Dispense:  90 tablet    Refill:  3   allopurinol  (ZYLOPRIM) 100 MG tablet    Sig: Take 2 tablets (200 mg total) by mouth daily.    Dispense:  180 tablet    Refill:  3   atenolol-chlorthalidone (TENORETIC) 50-25 MG tablet    Sig: TAKE 1 TABLET EVERY DAY    Dispense:  90 tablet    Refill:  3   lovastatin (MEVACOR) 40 MG tablet    Sig: Take 2 tablets (80 mg total) by mouth at bedtime.    Dispense:  180 tablet    Refill:  3   Potassium 99 MG TABS    Sig: Take 1 tablet (99 mg total) by mouth daily.    Dispense:  330 tablet    Refill:  0   Cholecalciferol (VITAMIN D3) 25 MCG (1000 UT) CAPS    Sig: Take 1 capsule (1,000 Units total) by mouth daily.    Dispense:  30 capsule   Orders Placed This Encounter  Procedures   ECHOCARDIOGRAM COMPLETE    Standing Status:   Future    Standing Expiration Date:   09/19/2023    Order Specific Question:   Where should this test be performed    Answer:   [facility] [place]    Order Specific Question:   Perflutren DEFINITY (image enhancing agent) should be administered unless hypersensitivity or allergy exist    Answer:   Administer Perflutren    Order Specific  Question:   Is a special reader required? (athlete or structural heart)    Answer:   No    Order Specific Question:   Does this study need to be read by the Structural team/Level 3 readers?    Answer:   No    Order Specific Question:   Reason for exam-Echo    Answer:   Murmur R01.1     Patient instructions: If interested, check with pharmacy about new 2 shot shingles series (shingrix). Restart metformin XR '500mg'$  daily for better sugar control, work on low sugar low carb diet.  Keep an eye on sugar levels, goal fasting 80-120, goal 2 hours after meal <180.  I will order heart ultrasound to get done at [facility].  Return in 4 months for diabetes and kidney follow up.   Follow up plan: Return in about 4 months (around 01/17/2023), or if symptoms worsen or fail to improve, for follow up visit.  Ria Bush, MD

## 2022-09-18 NOTE — Assessment & Plan Note (Signed)
Well controlled with daily acyclovir '400mg'$ . H/o flare when this was stopped - will continue.

## 2022-10-11 ENCOUNTER — Ambulatory Visit: Payer: Medicare HMO | Attending: Family Medicine

## 2022-10-11 DIAGNOSIS — R011 Cardiac murmur, unspecified: Secondary | ICD-10-CM | POA: Diagnosis not present

## 2022-10-11 MED ORDER — PERFLUTREN LIPID MICROSPHERE
1.0000 mL | INTRAVENOUS | Status: AC | PRN
  Administered 2022-10-11: 2 mL via INTRAVENOUS

## 2022-10-12 LAB — ECHOCARDIOGRAM COMPLETE
AR max vel: 0.93 cm2
AV Area VTI: 1.07 cm2
AV Area mean vel: 1.01 cm2
AV Mean grad: 22 mmHg
AV Peak grad: 45.2 mmHg
Ao pk vel: 3.36 m/s
Area-P 1/2: 3.23 cm2
S' Lateral: 3.3 cm

## 2022-10-14 ENCOUNTER — Other Ambulatory Visit: Payer: Self-pay | Admitting: Family Medicine

## 2022-10-14 DIAGNOSIS — I350 Nonrheumatic aortic (valve) stenosis: Secondary | ICD-10-CM

## 2022-10-25 ENCOUNTER — Telehealth: Payer: Self-pay | Admitting: Family Medicine

## 2022-10-25 NOTE — Telephone Encounter (Signed)
Spoke with pt relaying Dr. Synthia Innocent message from 10/11/22 ECHO results:   Your heart ultrasound returned showing normal heart pumping function as well as mild leaky mitral and aortic valves and new moderately stenotic aortic valve (valve that doesn't open up as much as it used to). I'd like to refer you to the heart doctor for evaluation of your heart valves.   Pt verbalizes understanding and expresses his thanks.

## 2022-10-25 NOTE — Telephone Encounter (Signed)
Patient called in and had questions about why a referral was put in to the cardiologist for him. He can be reached at [phone]. Thank you!

## 2022-12-12 ENCOUNTER — Ambulatory Visit: Payer: Medicare HMO | Attending: Internal Medicine | Admitting: Internal Medicine

## 2022-12-12 ENCOUNTER — Encounter: Payer: Self-pay | Admitting: Internal Medicine

## 2022-12-12 VITALS — BP 128/70 | HR 65 | Ht 68.75 in | Wt 239.0 lb

## 2022-12-12 DIAGNOSIS — E1169 Type 2 diabetes mellitus with other specified complication: Secondary | ICD-10-CM

## 2022-12-12 DIAGNOSIS — I10 Essential (primary) hypertension: Secondary | ICD-10-CM | POA: Diagnosis not present

## 2022-12-12 DIAGNOSIS — I2510 Atherosclerotic heart disease of native coronary artery without angina pectoris: Secondary | ICD-10-CM | POA: Diagnosis not present

## 2022-12-12 DIAGNOSIS — I350 Nonrheumatic aortic (valve) stenosis: Secondary | ICD-10-CM

## 2022-12-12 DIAGNOSIS — E785 Hyperlipidemia, unspecified: Secondary | ICD-10-CM | POA: Diagnosis not present

## 2022-12-12 NOTE — Progress Notes (Signed)
New Outpatient Visit Date: 12/12/2022  Referring Provider: Ria Bush, MD [address] [place],  [place] [address]  Chief Complaint: Evaluate aortic stenosis  HPI:  Thomas Carlson is a 77 y.o. male who is being seen today for the evaluation of aortic stenosis at the request of Dr. Danise Mina. He has a history of aortic stenosis and regurgitation (echo in 09/2022 demonstrating moderate AS and mild AI), coronary artery disease with PCI to the proximal LAD in 2005, hypertension, hyperlipidemia, type 2 diabetes mellitus, and remote seizure in the setting of subdural hematoma.  He was previously followed at [facility] by Dr. Ubaldo Glassing, having last been seen in 09/2018 for preoperative evaluation in anticipation of orthopedic surgery.  Today, Thomas Carlson reports that he has been feeling fairly well.  He notes some shortness of breath when walking uphill, which has been stable for at least 1 to 2 years.  He denies chest pain, palpitations, lightheadedness, and edema.  --------------------------------------------------------------------------------------------------  Cardiovascular History & Procedures: Cardiovascular Problems: Coronary artery disease Aortic valve disease  Risk Factors: Known coronary artery disease, hypertension, hyperlipidemia, type 2 diabetes mellitus, male gender, obesity, and age greater than 50  Cath/PCI: LHC/PCI (05/03/2004): High-grade stenosis of proximal LAD status post PCI with Cypher 3.5 x 18 mm drug-eluting stent.  CV Surgery: None  EP Procedures and Devices: None  Non-Invasive Evaluation(s): TTE (10/11/2022): Normal LV size with mild LVH.  LVEF 50-55% with normal wall motion and grade 1 diastolic dysfunction.  Normal RV size and function.  Mild left atrial enlargement.  Mild mitral regurgitation.  Severely calcified aortic valve with mild regurgitation and moderate stenosis (mean gradient 22 mmHg, AVA 1.1 cm).  Normal CVP. Pharmacologic MPI  (08/12/2018): Small in size, mild in severity, reversible defect involving the inferior region.  LVEF 52% with normal wall motion.  Recent CV Pertinent Labs: Lab Results  Component Value Date   CHOL 134 09/11/2022   CHOL 162 06/27/2011   HDL 33.70 (L) 09/11/2022   LDLCALC 68 09/11/2022   LDLCALC 86 05/19/2014   LDLDIRECT 69.0 03/02/2019   TRIG 161.0 (H) 09/11/2022   TRIG 163 05/19/2014   CHOLHDL 4 09/11/2022   INR 0.97 10/29/2018   K 3.3 (L) 09/11/2022   K 4.3 05/19/2014   MG 1.8 05/25/2020   BUN 33 (H) 09/11/2022   BUN 15 09/27/2019   CREATININE 1.59 (H) 09/11/2022   CREATININE 1.50 05/19/2014    --------------------------------------------------------------------------------------------------  Past Medical History:  Diagnosis Date   Acute posthemorrhagic anemia    Aortic valve sclerosis 08/13/2016   Sclerosis without stenosis by [place] (08/2016)   CAD (coronary artery disease) 2005   s/p stent (Fath)   Cholelithiasis    by CT   Chronic kidney disease, stage 3, mod decreased GFR (HCC)    Diabetes mellitus ([place]) 2010   Diverticulosis    by CT   DJD (degenerative joint disease)    knee   Encephalomalacia without cerebral infarction 03/08/2019   Remote anterior frontal and central MCA encephalomalacia after remote trauma noted on MRI 2020 [facility])   Genital herpes    Gout    Heart murmur    followed by PCP   HLD (hyperlipidemia)    HTN (hypertension)    Hypocalcemia 09/03/2019   Knee pain, right    "needs replacement"   Neuropathy    feet   Obesity    Seizures ([place])    x1 - after craniotomy (1960s)   Weakness of both legs    "  since back surgery"    Past Surgical History:  Procedure Laterality Date   [place]   COLONOSCOPY  02/2008   int hemorrhoids, diverticula (Dr. Allen Norris)   COLONOSCOPY N/A 08/26/2018   TA, rpt 3 yrs (Vanga, Tally Due)   COLONOSCOPY WITH PROPOFOL N/A 11/10/2015   diverticulosis,  path with microscopic colitis [name], MD)   COLONOSCOPY WITH PROPOFOL N/A 09/26/2021   diverticulosis, rpt 5 yrs (Vanga, Tally Due, MD)   CORONARY ANGIOPLASTY WITH STENT PLACEMENT  2005   stent 2005   DECOMPRESSIVE LUMBAR LAMINECTOMY LEVEL 1  03/2018   herniated L4/5 disc R with free fargment over L4 s/p surgery L4 ([place])   ESOPHAGOGASTRODUODENOSCOPY N/A 08/26/2018   reactive gastritis with benign biopsies (Vanga, Rohini Reddy)   HERNIA REPAIR  1956   inguinal   KNEE ARTHROSCOPY  2008   torn meniscus   SUBDURAL HEMATOMA EVACUATION VIA CRANIOTOMY  1964   hit in helmet by baseball   TOTAL KNEE ARTHROPLASTY Right 11/12/2018   Procedure: TOTAL KNEE ARTHROPLASTY;  Surgeon: Thornton Park, MD;  Location: [facility] ORS;  Service: Orthopedics;  Laterality: Right;    Current Meds  Medication Sig   acyclovir (ZOVIRAX) 400 MG tablet Take 1 tablet (400 mg total) by mouth daily.   allopurinol (ZYLOPRIM) 100 MG tablet Take 2 tablets (200 mg total) by mouth daily.   aspirin EC 81 MG tablet Take 1 tablet (81 mg total) by mouth daily.   atenolol-chlorthalidone (TENORETIC) 50-25 MG tablet TAKE 1 TABLET EVERY DAY   Biotin 1000 MCG tablet Take 1,000 mcg by mouth daily.    Cholecalciferol (VITAMIN D3) 25 MCG (1000 UT) CAPS Take 1 capsule (1,000 Units total) by mouth daily.   Cyanocobalamin (B-12) 1000 MCG SUBL Place 1 tablet under the tongue daily.   lovastatin (MEVACOR) 40 MG tablet Take 2 tablets (80 mg total) by mouth at bedtime.   Magnesium 500 MG TABS Take 500 mg by mouth daily.   metFORMIN (GLUCOPHAGE-XR) 500 MG 24 hr tablet Take 1 tablet (500 mg total) by mouth daily with breakfast.    Allergies: Gabapentin, Bactrim [sulfamethoxazole-trimethoprim], Penicillins, and Lasix [furosemide]  Social History   Tobacco Use   Smoking status: Never   Smokeless tobacco: Never  Vaping Use   Vaping Use: Never used  Substance Use Topics   Alcohol use: No    Alcohol/week: 0.0 standard drinks  of alcohol   Drug use: No    Family History  Problem Relation Age of Onset   Diabetes Father 60   Coronary artery disease Sister        catheterizations   COPD Brother    Stroke Brother    Hypertension Brother    Cancer Neg Hx     Review of Systems: A 12-system review of systems was performed and was negative except as noted in the HPI.  --------------------------------------------------------------------------------------------------  Physical Exam: BP 128/70 (BP Location: Right Arm, Patient Position: Sitting, Cuff Size: Large)   Pulse 65   Ht 5' 8.75" (1.746 m)   Wt 239 lb (108.4 kg)   SpO2 97%   BMI 35.55 kg/m   General: NAD HEENT: No conjunctival pallor or scleral icterus. Neck: Supple without lymphadenopathy, thyromegaly, JVD, or HJR. No carotid bruit. Lungs: Normal work of breathing. Clear to auscultation bilaterally without wheezes or crackles. Heart: Regular rate and rhythm with 2/6 systolic murmur. Abd: Bowel sounds present. Soft, NT/ND without hepatosplenomegaly Ext: No lower extremity edema.  Radial, PT, and DP pulses are 2+ bilaterally Skin: Warm and dry without rash. Neuro: CNIII-XII intact. Strength and fine-touch sensation intact in upper and lower extremities bilaterally. Psych: Normal mood and affect.  EKG: Normal sinus rhythm with borderline LVH and inferior infarct.  Criteria for inferior infarct are new since tracings from 2022 and 2017.  However, today's EKG looks similar to prior tracing on 06/27/2011.  Lab Results  Component Value Date   WBC 5.0 09/11/2022   HGB 14.6 09/11/2022   HCT 41.6 09/11/2022   MCV 94.6 09/11/2022   PLT 241.0 09/11/2022    Lab Results  Component Value Date   NA 143 09/11/2022   K 3.3 (L) 09/11/2022   CL 101 09/11/2022   CO2 29 09/11/2022   BUN 33 (H) 09/11/2022   CREATININE 1.59 (H) 09/11/2022   GLUCOSE 144 (H) 09/11/2022   ALT 16 09/11/2022    Lab Results  Component Value Date   CHOL 134 09/11/2022   HDL  33.70 (L) 09/11/2022   LDLCALC 68 09/11/2022   LDLDIRECT 69.0 03/02/2019   TRIG 161.0 (H) 09/11/2022   CHOLHDL 4 09/11/2022     --------------------------------------------------------------------------------------------------  ASSESSMENT AND PLAN: Aortic stenosis: Other than stable exertional dyspnea walking uphill, Thomas Carlson is asymptomatic.  Recent echocardiogram was personally reviewed and is consistent with moderate aortic stenosis that is unlikely to explain his exertional dyspnea alone.  I suspect morbid obesity is playing a role as well.  We discussed the natural history of aortic stenosis and we will plan to repeat an echocardiogram in about a year, sooner if symptoms progress.  Coronary artery disease: No angina reported, though exertional dyspnea could be an anginal equivalent.  EKG today shows inferior Q waves that were not noted in 2017 and 2022 but were seen on tracing in 2012.  Last MPI by Dr. Ubaldo Glassing in 2019 showed a small reversible inferior defect, felt to be low risk.  Given lack of angina and stable exertional dyspnea, we have agreed to defer additional testing at this time.  Continue aspirin and statin therapy.  Hyperlipidemia associated with type 2 diabetes mellitus: Most recent lipid panel in 08/2022 was notable for reasonable LDL 68 and mildly elevated triglycerides at 161.  We discussed the role for escalating to high intensity statin therapy in the setting of his known CAD.  However, we have agreed to continue lovastatin 80 mg daily for the time being.  Lipid panel should be repeated in the next few months with consideration for escalation of statin therapy if LDL has increased or triglycerides remain above 150.  Lifestyle modifications encouraged.  Essential hypertension: Blood pressure borderline elevated today (goal less than 130/80).  Continue current regimen of atenolol-chlorthalidone; sodium restriction encouraged.  Ongoing management per Dr. Danise Mina.  Morbid  obesity: BMI greater than 35 with multiple comorbidities including diabetes mellitus and coronary artery disease.  Weight loss encouraged to diet and exercise.  Follow-up: Return to clinic in 6 months.  Nelva Bush, MD 12/12/2022 2:13 PM

## 2022-12-12 NOTE — Patient Instructions (Signed)
Medication Instructions:  Your Physician recommend you continue on your current medication as directed.    *If you need a refill on your cardiac medications before your next appointment, please call your pharmacy*   Lab Work: None ordered today   Testing/Procedures: None ordered today   Follow-Up: At [facility], you and your health needs are our priority.  As part of our continuing mission to provide you with exceptional heart care, we have created designated Provider Care Teams.  These Care Teams include your primary Cardiologist (physician) and Advanced Practice Providers (APPs -  Physician Assistants and Nurse Practitioners) who all work together to provide you with the care you need, when you need it.  We recommend signing up for the patient portal called "MyChart".  Sign up information is provided on this After Visit Summary.  MyChart is used to connect with patients for Virtual Visits (Telemedicine).  Patients are able to view lab/test results, encounter notes, upcoming appointments, etc.  Non-urgent messages can be sent to your provider as well.   To learn more about what you can do with MyChart, go to [email or web address].    Your next appointment:   6 month(s)  Provider:   You may see Nelva Bush, MD or one of the following Advanced Practice Providers on your designated Care Team:   Murray Hodgkins, NP Christell Faith, PA-C Cadence Kathlen Mody, PA-C Gerrie Nordmann, NP    Dr. Saunders Revel recommends the following Limiting salt intake Increase exercise

## 2022-12-13 ENCOUNTER — Encounter: Payer: Self-pay | Admitting: Internal Medicine

## 2022-12-19 ENCOUNTER — Other Ambulatory Visit: Payer: Self-pay

## 2022-12-23 ENCOUNTER — Encounter: Payer: Self-pay | Admitting: Gastroenterology

## 2022-12-23 ENCOUNTER — Ambulatory Visit: Payer: Medicare HMO | Admitting: Gastroenterology

## 2022-12-23 VITALS — BP 131/67 | HR 76 | Temp 98.1°F | Ht 68.75 in | Wt 237.2 lb

## 2022-12-23 DIAGNOSIS — R197 Diarrhea, unspecified: Secondary | ICD-10-CM | POA: Diagnosis not present

## 2022-12-23 DIAGNOSIS — K52832 Lymphocytic colitis: Secondary | ICD-10-CM

## 2022-12-23 NOTE — Progress Notes (Signed)
Thomas Darby, MD [address]  [place], [place] [address]  Main: [phone]  Fax: [phone]    Gastroenterology Consultation  Referring Provider:     Ria Bush, MD Primary Care Physician:  Thomas Bush, MD Primary Gastroenterologist:  Dr. Cephas Carlson Reason for Consultation: Lymphocytic colitis        HPI:   Thomas Carlson is a 77 y.o. male referred by Dr. Ria Bush, MD  for consultation & management of symptomatic internal hemorrhoids.  Patient has history of stage III CKD, coronary artery disease who was admitted to [facility] on 08/24/1989 secondary to rectal bleeding, symptomatic anemia.  He was experiencing rectal bleeding for 2 weeks prior to presentation to the hospital.  Patient has history of lymphocytic colitis taking budesonide.  Patient reports painless hematochezia, hemoglobin on admission was 8.6, baseline 12.7 in 05/2018.  His potassium was 2.2 with AKI on CKD on admission.  Patient underwent upper endoscopy and colonoscopy.  EGD was unremarkable, colonoscopy revealed lymphocytic colitis again as well as large internal hemorrhoids.  He is here to discuss about hemorrhoid ligation.  Patient reports that his energy levels are significantly better, swelling of legs has significantly improved since discharge.  He is no longer experiencing rectal bleeding.  Follow-up visit 10/07/2018 He underwent first hemorrhoid ligation about 2 weeks ago.  Denies any rectal bleeding.  He continues to have watery diarrhea, 3-4 times daily, denies abdominal pain, nausea or vomiting.  He is taking budesonide 3 mg 3 pills with breakfast for microscopic colitis.  Otherwise, his hemoglobin has significantly improved since hospital discharge.  Follow-up visit 04/22/2019 He reports doing fairly well, underwent knee replacement, currently using cane instead of walker.  He has recuperated well.  He reports having 1-2 soft bowel movements daily.  He is done out  of budesonide.  He denies rectal bleeding.  He is also here to discuss about hemorrhoid ligation.  He denies any other GI complaints  Follow-up visit 08/18/2019 Patient is concerned about recurrence of diarrhea that has been ongoing for about 2 to 3 months.  He reports having approximately 8 episodes of nonbloody watery bowel movements during the day and 2 to 3 at night.  This has significantly impacted his quality of life, he had few episodes of incontinence as well.  He denies abdominal pain, bloating, nausea or vomiting, weight loss.  He acknowledges drinking 2-3 diet sodas daily.  He thinks budesonide is no longer working and is expensive.  He denies rectal bleeding  Follow-up visit 09/13/2019 Patient was admitted to [facility] due to worsening of diarrhea.  Stool studies were negative for infection including C. difficile.  Budesonide was restarted and he was discharged home on Bentyl and Imodium.  He continues to have 6-8 episodes of diarrhea during the day and 2-3 times at night.  He finished a course of Bentyl 4 times daily and did not seem to help.  He is waiting to refill budesonide and he thinks budesonide is not helping anymore.  He has been taking magnesium 500 mg daily for several months.  His PCP decrease Metformin to 500 mg twice daily as a potential trigger for worsening of diarrhea.  He is also on allopurinol. His weight has been stable.  He is drinking only water, eliminated carbonated beverages.  Follow-up visit 05/10/2020 Patient's diarrhea has resolved.  He followed up with [facility] for second opinion due to ongoing weight loss and chronic refractory diarrhea.  He was started  on EnteraGam for 1 month.  His diarrhea has resolved in a week after completion of treatment.  He is also regaining weight.  He does not have any GI concerns today.  He is currently not on Entocort  Follow-up visit 12/07/2020 Patient is here to discuss about colonoscopy because of history of tubular  adenoma of the colon.  He does not have any recurrence of diarrhea from underlying colitis.  He does notice scant amount of clear seepage per rectum intermittently and patient states that he is not overly concerned.  He had hemorrhoid ligation in the past.  He does not have any other GI concerns today.  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  NSAIDs: None   Antiplts/Anticoagulants/Anti thrombotics: None   GI Procedures: Colonoscopy by Dr. Allen Norris 10/2015 - The examined portion of the ileum was normal. Biopsied. - Diverticulosis in the sigmoid colon. - Non-bleeding internal hemorrhoids. - Random biopsies were obtained in the entire colon. Diagnosis 1. Colon, biopsy, terminal ileum - BENIGN SMALL BOWEL MUCOSA. NO VILLOUS ATROPHY, INFLAMMATION OR OTHER ABNORMALITIES PRESENT. 2. Colon, biopsy, random colon - BENIGN COLONIC MUCOSA WITH FEATURES CONSISTENT WITH MICROSCOPIC COLITIS, SEE COMMENT.  EGD 08/26/2018 - Duodenitis. - Normal second portion of the duodenum. - Non-bleeding erosive gastropathy. Biopsied. - Normal cardia, gastric fundus, gastric body and incisura. Biopsied. - Esophagogastric landmarks identified. - Normal gastroesophageal junction and esophagus.  Colonoscopy 08/26/2018 - Hemorrhoids found on perianal exam. - The examined portion of the ileum was normal. - Two 6 to 8 mm polyps in the transverse colon, removed with a hot snare. Resected and retrieved. - Severe diverticulosis in the sigmoid colon. There was no evidence of diverticular bleeding. - Non-bleeding external and internal hemorrhoids, with stigmata of recent bleeding, likely source of rectal Bleeding.  DIAGNOSIS:  A. STOMACH, RANDOM; COLD BIOPSY:  - ANTRAL MUCOSA WITH MILD REACTIVE GASTRITIS.  - UNREMARKABLE OXYNTIC MUCOSA.  - NEGATIVE FOR H. PYLORI, DYSPLASIA, AND MALIGNANCY.   B.  COLON POLYP X2, TRANSVERSE; HOT SNARE:  - TUBULAR ADENOMA (MULTIPLE FRAGMENTS).  -  NEGATIVE FOR HIGH-GRADE DYSPLASIA AND MALIGNANCY.   Past Medical History:  Diagnosis Date   Acute posthemorrhagic anemia    Aortic valve sclerosis 08/13/2016   Sclerosis without stenosis by [place] (08/2016)   CAD (coronary artery disease) 2005   s/p stent ([facility])   Cholelithiasis    by CT   Chronic kidney disease, stage 3, mod decreased GFR (HCC)    Diabetes mellitus ([place]) 2010   Diverticulosis    by CT   DJD (degenerative joint disease)    knee   Encephalomalacia without cerebral infarction 03/08/2019   Remote anterior frontal and central MCA encephalomalacia after remote trauma noted on MRI 2020 [facility])   Genital herpes    Gout    Heart murmur    followed by PCP   HLD (hyperlipidemia)    HTN (hypertension)    Hypocalcemia 09/03/2019   Knee pain, right    "needs replacement"   Neuropathy    feet   Obesity    Seizures ([place])    x1 - after craniotomy (1960s)   Weakness of both legs    "since back surgery"    Past Surgical History:  Procedure Laterality Date   [place]   COLONOSCOPY  02/2008   int hemorrhoids, diverticula (Dr. Allen Norris)   COLONOSCOPY N/A 08/26/2018   TA, rpt 3 yrs (Ezriel Boffa, [place])  COLONOSCOPY WITH PROPOFOL N/A 11/10/2015   diverticulosis, path with microscopic colitis [name], MD)   COLONOSCOPY WITH PROPOFOL N/A 09/26/2021   diverticulosis, rpt 5 yrs (Davia Smyre, Tally Due, MD)   CORONARY ANGIOPLASTY WITH STENT PLACEMENT  2005   stent 2005   DECOMPRESSIVE LUMBAR LAMINECTOMY LEVEL 1  03/2018   herniated L4/5 disc R with free fargment over L4 s/p surgery L4 Donney Rankins)   ESOPHAGOGASTRODUODENOSCOPY N/A 08/26/2018   reactive gastritis with benign biopsies (Eloyse Causey Reddy)   HERNIA REPAIR  1956   inguinal   KNEE ARTHROSCOPY  2008   torn meniscus   SUBDURAL HEMATOMA EVACUATION VIA CRANIOTOMY  1964   hit in helmet by baseball   TOTAL KNEE ARTHROPLASTY Right 11/12/2018   Procedure: TOTAL  KNEE ARTHROPLASTY;  Surgeon: Thornton Park, MD;  Location: [facility] ORS;  Service: Orthopedics;  Laterality: Right;    Current Outpatient Medications:    acyclovir (ZOVIRAX) 400 MG tablet, Take 1 tablet (400 mg total) by mouth daily., Disp: 90 tablet, Rfl: 3   allopurinol (ZYLOPRIM) 100 MG tablet, Take 2 tablets (200 mg total) by mouth daily., Disp: 180 tablet, Rfl: 3   aspirin EC 81 MG tablet, Take 1 tablet (81 mg total) by mouth daily., Disp: , Rfl:    atenolol-chlorthalidone (TENORETIC) 50-25 MG tablet, TAKE 1 TABLET EVERY DAY, Disp: 90 tablet, Rfl: 3   Biotin 1000 MCG tablet, Take 1,000 mcg by mouth daily. , Disp: , Rfl:    Cholecalciferol (VITAMIN D3) 25 MCG (1000 UT) CAPS, Take 1 capsule (1,000 Units total) by mouth daily., Disp: 30 capsule, Rfl:    Cyanocobalamin (B-12) 1000 MCG SUBL, Place 1 tablet under the tongue daily., Disp: , Rfl:    lovastatin (MEVACOR) 40 MG tablet, Take 2 tablets (80 mg total) by mouth at bedtime., Disp: 180 tablet, Rfl: 3   Magnesium 500 MG TABS, Take 500 mg by mouth daily., Disp: , Rfl:    metFORMIN (GLUCOPHAGE-XR) 500 MG 24 hr tablet, Take 1 tablet (500 mg total) by mouth daily with breakfast., Disp: 90 tablet, Rfl: 3   tiZANidine (ZANAFLEX) 2 MG tablet, Take 1 tablet by mouth daily., Disp: , Rfl:    Family History  Problem Relation Age of Onset   Diabetes Father 35   Coronary artery disease Sister        catheterizations   COPD Brother    Stroke Brother    Hypertension Brother    Cancer Neg Hx      Social History   Tobacco Use   Smoking status: Never   Smokeless tobacco: Never  Vaping Use   Vaping Use: Never used  Substance Use Topics   Alcohol use: No    Alcohol/week: 0.0 standard drinks of alcohol   Drug use: No    Allergies as of 12/23/2022 - Review Complete 12/23/2022  Allergen Reaction Noted   Gabapentin Other (See Comments) 08/19/2018   Bactrim [sulfamethoxazole-trimethoprim] Rash 05/19/2013   Penicillins Hives 06/18/2012   Lasix  [furosemide] Other (See Comments) 09/02/2018    Review of Systems:    All systems reviewed and negative except where noted in HPI.   Physical Exam:  BP 131/67 (BP Location: Left Arm, Patient Position: Sitting, Cuff Size: Normal)   Pulse 76   Temp 98.1 F (36.7 C) (Oral)   Ht 5' 8.75" (1.746 m)   Wt 237 lb 4 oz (107.6 kg)   BMI 35.29 kg/m  No LMP for male patient.  General:   Alert,  Well-developed, well-nourished,  pleasant and cooperative in NAD Head:  Normocephalic and atraumatic. Eyes:  Sclera clear, no icterus.   Conjunctiva pink. Ears:  Normal auditory acuity. Nose:  No deformity, discharge, or lesions. Mouth:  No deformity or lesions,oropharynx pink & moist. Neck:  Supple; no masses or thyromegaly. Lungs:  Respirations even and unlabored.  Clear throughout to auscultation.   No wheezes, crackles, or rhonchi. No acute distress. Heart:  Regular rate and rhythm; no murmurs, clicks, rubs, or gallops. Abdomen:  Normal bowel sounds. Soft, obese, non-tender and non-distended without masses, hepatosplenomegaly or hernias noted.  No guarding or rebound tenderness.   Rectal: Nontender, large internal hemorrhoids Msk:  Symmetrical without gross deformities. Good, equal movement & strength bilaterally. Pulses:  Normal pulses noted. Extremities:  No clubbing, bilateral swelling of feet.  No cyanosis. Neurologic:  Alert and oriented x3;  grossly normal neurologically. Skin:  Intact without significant lesions or rashes. No jaundice. Psych:  Alert and cooperative. Normal mood and affect.  Imaging Studies: Reviewed  Assessment and Plan:   Thomas Carlson is a 77 y.o. Caucasian male with lymphocytic colitis, chronic kidney disease, coronary artery disease with rectal bleeding secondary to internal hemorrhoids resulting in anemia which is currently resolved, status post hemorrhoid ligation x3.  History of lymphocytic colitis that was refractory to Entocort.  Resolved after treating with  Enteragam.   Multiple adenomas of the colon Recommend surveillance colonoscopy in 08/2021 Reassured patient that he will receive a call from our office or referral from his PCP to schedule colonoscopy in November 2022.  He is early to undergo at this time.  Patient expressed understanding of the plan   Follow up as needed   Thomas Darby, MD

## 2022-12-24 ENCOUNTER — Encounter: Payer: Self-pay | Admitting: Gastroenterology

## 2022-12-24 DIAGNOSIS — R197 Diarrhea, unspecified: Secondary | ICD-10-CM | POA: Diagnosis not present

## 2022-12-25 LAB — CELIAC DISEASE PANEL
Endomysial IgA: NEGATIVE
IgA/Immunoglobulin A, Serum: 169 mg/dL (ref 61–437)
Transglutaminase IgA: <2 U/mL (ref 0–3)

## 2022-12-26 LAB — GI PROFILE, STOOL, PCR

## 2022-12-30 ENCOUNTER — Telehealth: Payer: Self-pay

## 2022-12-30 NOTE — Telephone Encounter (Signed)
Patient verbalized understanding of results. She states the PA has left the practice but he will call them to get a appointment for a second opinion

## 2022-12-30 NOTE — Telephone Encounter (Signed)
-----   Message from Lin Landsman, MD sent at 12/28/2022 10:51 PM EST ----- Thomas Carlson  Please inform patient that the stool study results came back negative. Please ask patient if he could call the [facility] gastroenterologist (he saw a PA for second opinion) that he saw previously to see if they can get him samples of enteragam.  This has helped him in the past for diarrhea  Dr. Danise Mina  Also, I recommend him to be referred to allergy and immunology ASAP to evaluate for common variable immunodeficiency and immunoglobulin therapy  Rohini Vanga

## 2023-01-02 ENCOUNTER — Other Ambulatory Visit: Payer: Self-pay | Admitting: Family Medicine

## 2023-01-02 ENCOUNTER — Encounter: Payer: Self-pay | Admitting: *Deleted

## 2023-01-02 DIAGNOSIS — K52832 Lymphocytic colitis: Secondary | ICD-10-CM

## 2023-01-02 DIAGNOSIS — R197 Diarrhea, unspecified: Secondary | ICD-10-CM

## 2023-01-02 DIAGNOSIS — D809 Immunodeficiency with predominantly antibody defects, unspecified: Secondary | ICD-10-CM

## 2023-01-02 NOTE — Progress Notes (Signed)
New immunology referral placed.

## 2023-01-02 NOTE — Addendum Note (Signed)
Addended by: Ria Bush on: 01/02/2023 12:27 PM   Modules accepted: Orders

## 2023-01-13 NOTE — Telephone Encounter (Signed)
Pt received a call regarding a referral to Dr. Varney Biles. Pt is unaware of the referral and would like some clarification.

## 2023-01-16 NOTE — Telephone Encounter (Signed)
Patient called states that symptoms have resolved for the time. If they come back he will call and schedule appointment.

## 2023-01-17 ENCOUNTER — Ambulatory Visit: Payer: Medicare HMO | Admitting: Family Medicine

## 2023-01-27 DIAGNOSIS — B0052 Herpesviral keratitis: Secondary | ICD-10-CM | POA: Diagnosis not present

## 2023-01-31 DIAGNOSIS — B0052 Herpesviral keratitis: Secondary | ICD-10-CM | POA: Diagnosis not present

## 2023-03-06 DIAGNOSIS — H43813 Vitreous degeneration, bilateral: Secondary | ICD-10-CM | POA: Diagnosis not present

## 2023-03-06 DIAGNOSIS — Z0100 Encounter for examination of eyes and vision without abnormal findings: Secondary | ICD-10-CM | POA: Diagnosis not present

## 2023-03-06 DIAGNOSIS — E119 Type 2 diabetes mellitus without complications: Secondary | ICD-10-CM | POA: Diagnosis not present

## 2023-03-06 DIAGNOSIS — H2513 Age-related nuclear cataract, bilateral: Secondary | ICD-10-CM | POA: Diagnosis not present

## 2023-03-06 LAB — HM DIABETES EYE EXAM

## 2023-03-10 ENCOUNTER — Ambulatory Visit: Payer: Medicare HMO | Admitting: Family Medicine

## 2023-03-12 ENCOUNTER — Encounter: Payer: Self-pay | Admitting: Family Medicine

## 2023-03-18 ENCOUNTER — Encounter: Payer: Self-pay | Admitting: Family Medicine

## 2023-03-18 ENCOUNTER — Ambulatory Visit (INDEPENDENT_AMBULATORY_CARE_PROVIDER_SITE_OTHER): Payer: Medicare HMO | Admitting: Family Medicine

## 2023-03-18 VITALS — BP 136/68 | HR 66 | Temp 97.8°F | Ht 68.75 in | Wt 237.1 lb

## 2023-03-18 DIAGNOSIS — I10 Essential (primary) hypertension: Secondary | ICD-10-CM | POA: Diagnosis not present

## 2023-03-18 DIAGNOSIS — I350 Nonrheumatic aortic (valve) stenosis: Secondary | ICD-10-CM

## 2023-03-18 DIAGNOSIS — D809 Immunodeficiency with predominantly antibody defects, unspecified: Secondary | ICD-10-CM | POA: Diagnosis not present

## 2023-03-18 DIAGNOSIS — D804 Selective deficiency of immunoglobulin M [IgM]: Secondary | ICD-10-CM | POA: Diagnosis not present

## 2023-03-18 DIAGNOSIS — E1122 Type 2 diabetes mellitus with diabetic chronic kidney disease: Secondary | ICD-10-CM

## 2023-03-18 DIAGNOSIS — D803 Selective deficiency of immunoglobulin G [IgG] subclasses: Secondary | ICD-10-CM | POA: Diagnosis not present

## 2023-03-18 DIAGNOSIS — Z7984 Long term (current) use of oral hypoglycemic drugs: Secondary | ICD-10-CM

## 2023-03-18 DIAGNOSIS — E1169 Type 2 diabetes mellitus with other specified complication: Secondary | ICD-10-CM | POA: Diagnosis not present

## 2023-03-18 DIAGNOSIS — N183 Chronic kidney disease, stage 3 unspecified: Secondary | ICD-10-CM

## 2023-03-18 LAB — POC URINALSYSI DIPSTICK (AUTOMATED)
Bilirubin, UA: NEGATIVE
Blood, UA: NEGATIVE
Glucose, UA: NEGATIVE
Ketones, UA: NEGATIVE
Leukocytes, UA: NEGATIVE
Nitrite, UA: NEGATIVE
Protein, UA: NEGATIVE
Spec Grav, UA: 1.025 (ref 1.010–1.025)
Urobilinogen, UA: 0.2 E.U./dL
pH, UA: 5 (ref 5.0–8.0)

## 2023-03-18 LAB — POCT GLYCOSYLATED HEMOGLOBIN (HGB A1C): Hemoglobin A1C: 6.9 % — AB (ref 4.0–5.6)

## 2023-03-18 NOTE — Assessment & Plan Note (Addendum)
Chronic, stable on atenolol/chlorthalidone 50/25mg  daily.  Discussed possible transition to ACEI/ARB in diabetic - will await lab results.

## 2023-03-18 NOTE — Assessment & Plan Note (Signed)
Moderate AS 2023 - established with Dr Okey Dupre

## 2023-03-18 NOTE — Assessment & Plan Note (Addendum)
Discussed h/o Ig deficiency.  He declines immunology eval at this time. He previously had difficulty scheduling appointment.  Update antibody levels today. Denies recurrent fever, infections.  Saw [facility] 2021, s/p Entagram treatment for chronic diarrhea refractory to Entocort.

## 2023-03-18 NOTE — Assessment & Plan Note (Signed)
Chronic, reviewed with patient. GFR stays in 40s. Update labs.  He would benefit from ACEI/ARB - discussed.

## 2023-03-18 NOTE — Patient Instructions (Addendum)
Labs today including kidney function and antibody levels (immunoglobulins). Urinalysis today as well.  Good to see you today  Return in 6 months for physical (after 09/19/2023)

## 2023-03-18 NOTE — Assessment & Plan Note (Addendum)
Chronic, improved since restarting metformin XR 500mg  daily - continue this.

## 2023-03-18 NOTE — Progress Notes (Signed)
Ph: [phone] Fax: [phone]   Patient ID: Thomas Carlson, male    DOB: [birth year], 77 y.o.   MRN: [id number]  This visit was conducted in person.  BP 136/68   Pulse 66   Temp 97.8 F (36.6 C) (Temporal)   Ht 5' 8.75" (1.746 m)   Wt 237 lb 2 oz (107.6 kg)   SpO2 93%   BMI 35.27 kg/m    CC: 4 mo f/u visit  Subjective:   HPI: Thomas Carlson is a 77 y.o. male presenting on 03/18/2023 for Medical Management of Chronic Issues (Here for 4 mo DM, kidney f/u.)   H/o low IgG and IgM and low normal IgA along with refractory lymphocytic colitis to Entocort that only responded to Entagram 2021 - saw [facility] for this.   Echocardiogram 09/2022 - EF 50-55%, normal wall motion, mild LVH, G1DD, mildly dilated LA, mild MR, severe AV calcification with mild AR, mod AS. Established with cardiology - with repeat yearly echo planned to monitor mod AS.   DM - does not regularly check sugars. Compliant with antihyperglycemic regimen which includes: metformin XR 500mg  daily. Denies low sugars or hypoglycemic symptoms. Denies paresthesias, blurry vision. Last diabetic eye exam 02/2023 - no retinopathy. Glucometer brand: unsure. Last foot exam: 2018 - DUE. DSME: not completed. Lab Results  Component Value Date   HGBA1C 6.9 (A) 03/18/2023   Diabetic Foot Exam - Simple   Simple Foot Form Diabetic Foot exam was performed with the following findings: Yes 03/18/2023  3:02 PM  Visual Inspection See comments: Yes Sensation Testing Intact to touch and monofilament testing bilaterally: Yes Pulse Check See comments: Yes Comments No claudication R>L with plantar warts, callus  Mildly diminished pedal pulses    Lab Results  Component Value Date   MICROALBUR 1.0 09/11/2022         Relevant past medical, surgical, family and social history reviewed and updated as indicated. Interim medical history since our last visit reviewed. Allergies and medications reviewed and updated. Outpatient  Medications Prior to Visit  Medication Sig Dispense Refill   acyclovir (ZOVIRAX) 400 MG tablet Take 1 tablet (400 mg total) by mouth daily. 90 tablet 3   allopurinol (ZYLOPRIM) 100 MG tablet Take 2 tablets (200 mg total) by mouth daily. 180 tablet 3   aspirin EC 81 MG tablet Take 1 tablet (81 mg total) by mouth daily.     atenolol-chlorthalidone (TENORETIC) 50-25 MG tablet TAKE 1 TABLET EVERY DAY 90 tablet 3   Biotin 1000 MCG tablet Take 1,000 mcg by mouth daily.      Cholecalciferol (VITAMIN D3) 25 MCG (1000 UT) CAPS Take 1 capsule (1,000 Units total) by mouth daily. 30 capsule    Cyanocobalamin (B-12) 1000 MCG SUBL Place 1 tablet under the tongue daily.     lovastatin (MEVACOR) 40 MG tablet Take 2 tablets (80 mg total) by mouth at bedtime. 180 tablet 3   Magnesium 500 MG TABS Take 500 mg by mouth daily.     metFORMIN (GLUCOPHAGE-XR) 500 MG 24 hr tablet Take 1 tablet (500 mg total) by mouth daily with breakfast. 90 tablet 3   tiZANidine (ZANAFLEX) 2 MG tablet Take 1 tablet by mouth daily.     No facility-administered medications prior to visit.     Per HPI unless specifically indicated in ROS section below Review of Systems  Objective:  BP 136/68   Pulse 66   Temp 97.8 F (36.6 C) (Temporal)   Ht 5' 8.75" (  1.746 m)   Wt 237 lb 2 oz (107.6 kg)   SpO2 93%   BMI 35.27 kg/m   Wt Readings from Last 3 Encounters:  03/18/23 237 lb 2 oz (107.6 kg)  12/23/22 237 lb 4 oz (107.6 kg)  12/12/22 239 lb (108.4 kg)      Physical Exam Vitals and nursing note reviewed.  Constitutional:      Appearance: Normal appearance. He is not ill-appearing.  HENT:     Head: Normocephalic and atraumatic.  Eyes:     Extraocular Movements: Extraocular movements intact.     Conjunctiva/sclera: Conjunctivae normal.     Pupils: Pupils are equal, round, and reactive to light.  Cardiovascular:     Rate and Rhythm: Normal rate and regular rhythm.     Pulses: Normal pulses.     Heart sounds: Murmur (4/6  systolic USB) heard.  Pulmonary:     Effort: Pulmonary effort is normal. No respiratory distress.     Breath sounds: Normal breath sounds. No wheezing, rhonchi or rales.  Musculoskeletal:     Right lower leg: No edema.     Left lower leg: No edema.     Comments: See HPI for foot exam if done  Skin:    General: Skin is warm and dry.     Findings: No rash.  Neurological:     Mental Status: He is alert.  Psychiatric:        Mood and Affect: Mood normal.        Behavior: Behavior normal.       Results for orders placed or performed in visit on 03/18/23  POCT glycosylated hemoglobin (Hb A1C)  Result Value Ref Range   Hemoglobin A1C 6.9 (A) 4.0 - 5.6 %   HbA1c POC (<> result, manual entry)     HbA1c, POC (prediabetic range)     HbA1c, POC (controlled diabetic range)      Assessment & Plan:   Problem List Items Addressed This Visit     Type 2 diabetes mellitus with other specified complication (HCC) - Primary    Chronic, improved since restarting metformin XR 500mg  daily - continue this.       Relevant Orders   POCT glycosylated hemoglobin (Hb A1C) (Completed)   Hypertension    Chronic, stable on atenolol/chlorthalidone 50/25mg  daily.  Discussed possible transition to ACEI/ARB in diabetic - will await lab results.       CKD stage 3 due to type 2 diabetes mellitus (HCC)    Chronic, reviewed with patient. GFR stays in 40s. Update labs.  He would benefit from ACEI/ARB - discussed.       Relevant Orders   POCT Urinalysis Dipstick (Automated)   Renal function panel   Immunoglobulin deficiency (HCC)    Discussed h/o Ig deficiency.  He declines immunology eval at this time. He previously had difficulty scheduling appointment.  Update antibody levels today. Denies recurrent fever, infections.  Saw [facility] 2021, s/p Entagram treatment for chronic diarrhea refractory to Entocort.       Relevant Orders   Immunoglobulins, QN, A/E/G/M   Aortic stenosis    Moderate AS 2023 -  established with Dr End        No orders of the defined types were placed in this encounter.   Orders Placed This Encounter  Procedures   Renal function panel   Immunoglobulins, QN, A/E/G/M   POCT glycosylated hemoglobin (Hb A1C)   POCT Urinalysis Dipstick (Automated)    Patient Instructions  Labs today including kidney function and antibody levels (immunoglobulins). Urinalysis today as well.  Good to see you today  Return in 6 months for physical (after 09/19/2023)  Follow up plan: Return in about 6 months (around 09/19/2023) for annual exam, prior fasting for blood work, medicare wellness visit.  Eustaquio Boyden, MD

## 2023-03-19 LAB — RENAL FUNCTION PANEL
Albumin: 4.3 g/dL (ref 3.5–5.2)
BUN: 38 mg/dL — ABNORMAL HIGH (ref 6–23)
CO2: 26 mEq/L (ref 19–32)
Calcium: 9.3 mg/dL (ref 8.4–10.5)
Chloride: 101 mEq/L (ref 96–112)
Creatinine, Ser: 1.78 mg/dL — ABNORMAL HIGH (ref 0.40–1.50)
GFR: 36.26 mL/min — ABNORMAL LOW (ref >60.00)
Glucose, Bld: 149 mg/dL — ABNORMAL HIGH (ref 70–99)
Phosphorus: 3.1 mg/dL (ref 2.3–4.6)
Potassium: 4 mEq/L (ref 3.5–5.1)
Sodium: 138 mEq/L (ref 135–145)

## 2023-03-21 DIAGNOSIS — M4316 Spondylolisthesis, lumbar region: Secondary | ICD-10-CM | POA: Diagnosis not present

## 2023-03-21 LAB — IMMUNOGLOBULINS A/E/G/M, SERUM
IgA/Immunoglobulin A, Serum: 152 mg/dL (ref 61–437)
IgE (Immunoglobulin E), Serum: 34 IU/mL (ref 6–495)
IgG (Immunoglobin G), Serum: 583 mg/dL — ABNORMAL LOW (ref 603–1613)
IgM (Immunoglobulin M), Srm: 14 mg/dL — ABNORMAL LOW (ref 15–143)

## 2023-03-24 ENCOUNTER — Other Ambulatory Visit: Payer: Self-pay | Admitting: Family Medicine

## 2023-03-24 DIAGNOSIS — I10 Essential (primary) hypertension: Secondary | ICD-10-CM

## 2023-03-24 MED ORDER — LOSARTAN POTASSIUM 25 MG PO TABS
25.0000 mg | ORAL_TABLET | Freq: Every day | ORAL | 6 refills | Status: DC
Start: 2023-03-24 — End: 2023-04-10

## 2023-03-24 MED ORDER — ATENOLOL 25 MG PO TABS: 25.0000 mg | ORAL_TABLET | Freq: Every day | ORAL | 6 refills | Status: DC

## 2023-03-27 ENCOUNTER — Ambulatory Visit: Payer: Medicare HMO | Admitting: Gastroenterology

## 2023-04-03 ENCOUNTER — Other Ambulatory Visit (INDEPENDENT_AMBULATORY_CARE_PROVIDER_SITE_OTHER): Payer: Medicare HMO

## 2023-04-03 DIAGNOSIS — I10 Essential (primary) hypertension: Secondary | ICD-10-CM

## 2023-04-04 LAB — BASIC METABOLIC PANEL
BUN: 25 mg/dL — ABNORMAL HIGH (ref 6–23)
CO2: 21 mEq/L (ref 19–32)
Calcium: 8.4 mg/dL (ref 8.4–10.5)
Chloride: 105 mEq/L (ref 96–112)
Creatinine, Ser: 1.55 mg/dL — ABNORMAL HIGH (ref 0.40–1.50)
GFR: 42.79 mL/min — ABNORMAL LOW (ref >60.00)
Glucose, Bld: 126 mg/dL — ABNORMAL HIGH (ref 70–99)
Potassium: 3.8 mEq/L (ref 3.5–5.1)
Sodium: 138 mEq/L (ref 135–145)

## 2023-04-10 ENCOUNTER — Telehealth: Payer: Self-pay | Admitting: Family Medicine

## 2023-04-10 MED ORDER — LOSARTAN POTASSIUM 50 MG PO TABS: 50.0000 mg | ORAL_TABLET | Freq: Every day | ORAL | 6 refills | Status: DC

## 2023-04-10 NOTE — Telephone Encounter (Signed)
Agree with verifying home cuff accurate by comparing with pharmacy cuff. May also bring in to next OV and we can compare. If comparable readings, recommend he increase losartan to 50mg  daily - may take 2 tablets once daily until runs out, new Rx for 50mg  losartan will be at pharmacy.

## 2023-04-10 NOTE — Telephone Encounter (Signed)
Patient called in regarding his lab results ,to let Dr Reece Agar know that his bp is still running a bit high ,reading 175/80,with the new medication regimen that he's on. He said that he's not sure that his b/p monitor is calibrated correctly and that he would go to a pharmacy to have them recheck his b/p an see if they could look at his monitor as well.

## 2023-04-10 NOTE — Telephone Encounter (Signed)
Spoke with pt relaying Dr. G's message. Pt verbalizes understanding.  

## 2023-07-12 ENCOUNTER — Other Ambulatory Visit: Payer: Self-pay | Admitting: Family Medicine

## 2023-07-12 DIAGNOSIS — M1A29X0 Drug-induced chronic gout, multiple sites, without tophus (tophi): Secondary | ICD-10-CM

## 2023-07-18 ENCOUNTER — Telehealth: Payer: Self-pay

## 2023-07-18 NOTE — Telephone Encounter (Signed)
LVM for patient to c/b and schedule.

## 2023-07-18 NOTE — Telephone Encounter (Signed)
Plz schedule CPE, after 09/19/23 and fasting lab (no food/drink- except water and/or blk coffee 5 hrs prior) visits.

## 2023-07-28 ENCOUNTER — Other Ambulatory Visit: Payer: Self-pay | Admitting: Family Medicine

## 2023-07-31 ENCOUNTER — Encounter: Payer: Self-pay | Admitting: Internal Medicine

## 2023-07-31 ENCOUNTER — Telehealth: Payer: Medicare HMO | Admitting: Internal Medicine

## 2023-07-31 VITALS — BP 127/72

## 2023-07-31 DIAGNOSIS — U071 COVID-19: Secondary | ICD-10-CM

## 2023-07-31 MED ORDER — NIRMATRELVIR/RITONAVIR (PAXLOVID) TABLET (RENAL DOSING): 2.0000 | ORAL_TABLET | Freq: Two times a day (BID) | ORAL | 0 refills | Status: AC
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
## 2023-07-31 NOTE — Assessment & Plan Note (Signed)
Moderate symptoms but at increased risk Continue supportive care Will try the paxlovid---renal dose If worsens, needs ER evaluation Isolate till Monday--then mask if goes out

## 2023-08-05 ENCOUNTER — Telehealth: Payer: Self-pay | Admitting: Family Medicine

## 2023-08-05 DIAGNOSIS — I10 Essential (primary) hypertension: Secondary | ICD-10-CM

## 2023-08-05 MED ORDER — ATENOLOL 25 MG PO TABS: 25.0000 mg | ORAL_TABLET | Freq: Every day | ORAL | 0 refills | Status: DC

## 2023-08-05 MED ORDER — LOSARTAN POTASSIUM 50 MG PO TABS
50.0000 mg | ORAL_TABLET | Freq: Every day | ORAL | 0 refills | Status: DC
Start: 2023-08-05 — End: 2023-12-25

## 2023-08-05 NOTE — Telephone Encounter (Signed)
Spoke to pt, pt states he'd contact office back to schedule appt

## 2023-08-05 NOTE — Telephone Encounter (Signed)
E-scribed refills.   Pt has [facility] wellness on 11/11/204. Plz schedule CPE and fasting lab (no food/drink- except water and/or blk coffee 5 hrs prior) visits after 09/08/23 for additional refills.

## 2023-08-05 NOTE — Telephone Encounter (Signed)
Patient would like for medications losartan (COZAAR) 50 MG tablet and atenolol (TENORMIN) 25 MG tablet to be sent through [facility] from now on. Please send refills to this pharmacy from now on

## 2023-08-06 ENCOUNTER — Other Ambulatory Visit: Payer: Self-pay | Admitting: Family Medicine

## 2023-09-03 ENCOUNTER — Other Ambulatory Visit: Payer: Self-pay | Admitting: Family Medicine

## 2023-09-04 DIAGNOSIS — L814 Other melanin hyperpigmentation: Secondary | ICD-10-CM | POA: Diagnosis not present

## 2023-09-04 DIAGNOSIS — L570 Actinic keratosis: Secondary | ICD-10-CM | POA: Diagnosis not present

## 2023-09-04 DIAGNOSIS — D225 Melanocytic nevi of trunk: Secondary | ICD-10-CM | POA: Diagnosis not present

## 2023-09-04 DIAGNOSIS — L821 Other seborrheic keratosis: Secondary | ICD-10-CM | POA: Diagnosis not present

## 2023-09-04 DIAGNOSIS — D492 Neoplasm of unspecified behavior of bone, soft tissue, and skin: Secondary | ICD-10-CM | POA: Diagnosis not present

## 2023-09-04 DIAGNOSIS — D0471 Carcinoma in situ of skin of right lower limb, including hip: Secondary | ICD-10-CM | POA: Diagnosis not present

## 2023-09-04 DIAGNOSIS — L538 Other specified erythematous conditions: Secondary | ICD-10-CM | POA: Diagnosis not present
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
## 2023-09-09 ENCOUNTER — Encounter: Payer: Self-pay | Admitting: Physician Assistant

## 2023-09-09 ENCOUNTER — Ambulatory Visit: Payer: Medicare HMO | Admitting: Physician Assistant

## 2023-09-09 VITALS — BP 116/68 | HR 88 | Temp 98.1°F | Ht 69.0 in | Wt 239.8 lb

## 2023-09-09 DIAGNOSIS — Z860101 Personal history of adenomatous and serrated colon polyps: Secondary | ICD-10-CM

## 2023-09-09 DIAGNOSIS — K52832 Lymphocytic colitis: Secondary | ICD-10-CM | POA: Diagnosis not present

## 2023-09-09 DIAGNOSIS — R197 Diarrhea, unspecified: Secondary | ICD-10-CM | POA: Diagnosis not present

## 2023-09-09 MED ORDER — BUDESONIDE 3 MG PO CPEP
9.0000 mg | ORAL_CAPSULE | Freq: Every day | ORAL | 1 refills | Status: AC
Start: 2023-09-09 — End: 2023-11-08

## 2023-09-10 LAB — BASIC METABOLIC PANEL
BUN/Creatinine Ratio: 12 (ref 10–24)
BUN: 18 mg/dL (ref 8–27)
CO2: 21 mmol/L (ref 20–29)
Calcium: 8.8 mg/dL (ref 8.6–10.2)
Chloride: 104 mmol/L (ref 96–106)
Creatinine, Ser: 1.55 mg/dL — ABNORMAL HIGH (ref 0.76–1.27)
Glucose: 108 mg/dL — ABNORMAL HIGH (ref 70–99)
Potassium: 3.3 mmol/L — ABNORMAL LOW (ref 3.5–5.2)
Sodium: 140 mmol/L (ref 134–144)
eGFR: 46 mL/min/{1.73_m2} — ABNORMAL LOW (ref >59)

## 2023-09-10 LAB — MAGNESIUM: Magnesium: 2 mg/dL (ref 1.6–2.3)

## 2023-09-15 ENCOUNTER — Telehealth: Payer: Self-pay

## 2023-09-15 NOTE — Telephone Encounter (Signed)
Patient stated right now things are good. Budesonide is working well. Hi [name], Your labs show: 1.  Normal Magnesium.  You can stop your magnesium supplement for 2 to 3 weeks just to see if that will help your diarrhea.  If diarrhea does not improve off magnesium, then restart magnesium supplement daily. Marland Kitchen..

## 2023-09-25 ENCOUNTER — Other Ambulatory Visit: Payer: Self-pay | Admitting: Family Medicine

## 2023-09-25 DIAGNOSIS — M1A29X0 Drug-induced chronic gout, multiple sites, without tophus (tophi): Secondary | ICD-10-CM

## 2023-10-03 ENCOUNTER — Telehealth: Payer: Self-pay | Admitting: Gastroenterology

## 2023-10-03 NOTE — Telephone Encounter (Signed)
The patient called in about the medication (Entocort) 3 MG that Mrs. Gerre Pebbles has prescribed him. He said he got enough for the first month but not enough for the second or third month. He got it filled at [facility] on [address] [place], [place], [place] [address]. Please advise because the patient is unsure on what to do for the following two months.

## 2023-10-03 NOTE — Telephone Encounter (Signed)
Was last seen on 09/09/2023 and Tina plan was Flareup of lymphocytic colitis diarrhea (originally diagnosed 10/2015):             Start budesonide 3 mg take 3 capsules once daily every morning for 1 month, then 2 capsules daily for 1 month, then 1 capsule daily for 1 month, then discontinue.

## 2023-10-03 NOTE — Telephone Encounter (Signed)
Patient knew these recommendations and informed him he did have a refill at the pharmacy he said okay that all he needed to know

## 2023-10-10 ENCOUNTER — Other Ambulatory Visit: Payer: Self-pay

## 2023-10-10 ENCOUNTER — Telehealth: Payer: Self-pay

## 2023-10-10 MED ORDER — BUDESONIDE 3 MG PO CPEP: 3.0000 mg | ORAL_CAPSULE | Freq: Three times a day (TID) | ORAL | 0 refills | Status: DC

## 2023-10-10 NOTE — Telephone Encounter (Signed)
Patient stated he started budesimide-prescription needs to be Budesonide 3 mg #90 in order to take it as directed by Inetta Fermo. Sent to pharmacy.

## 2023-10-14 ENCOUNTER — Ambulatory Visit: Payer: Medicare HMO | Admitting: Physician Assistant

## 2023-10-15 DIAGNOSIS — D0471 Carcinoma in situ of skin of right lower limb, including hip: Secondary | ICD-10-CM | POA: Diagnosis not present

## 2023-11-03 ENCOUNTER — Ambulatory Visit (INDEPENDENT_AMBULATORY_CARE_PROVIDER_SITE_OTHER): Payer: Medicare HMO | Admitting: Family

## 2023-11-03 ENCOUNTER — Encounter: Payer: Self-pay | Admitting: Family

## 2023-11-03 VITALS — BP 144/92 | HR 76 | Temp 98.0°F | Ht 68.5 in | Wt 235.8 lb

## 2023-11-03 DIAGNOSIS — R5383 Other fatigue: Secondary | ICD-10-CM

## 2023-11-03 DIAGNOSIS — N400 Enlarged prostate without lower urinary tract symptoms: Secondary | ICD-10-CM

## 2023-11-03 DIAGNOSIS — Z7984 Long term (current) use of oral hypoglycemic drugs: Secondary | ICD-10-CM

## 2023-11-03 DIAGNOSIS — N183 Chronic kidney disease, stage 3 unspecified: Secondary | ICD-10-CM

## 2023-11-03 DIAGNOSIS — E538 Deficiency of other specified B group vitamins: Secondary | ICD-10-CM

## 2023-11-03 DIAGNOSIS — B370 Candidal stomatitis: Secondary | ICD-10-CM | POA: Diagnosis not present

## 2023-11-03 DIAGNOSIS — R432 Parageusia: Secondary | ICD-10-CM | POA: Diagnosis not present

## 2023-11-03 DIAGNOSIS — E1169 Type 2 diabetes mellitus with other specified complication: Secondary | ICD-10-CM | POA: Diagnosis not present

## 2023-11-03 DIAGNOSIS — R350 Frequency of micturition: Secondary | ICD-10-CM

## 2023-11-03 DIAGNOSIS — R81 Glycosuria: Secondary | ICD-10-CM | POA: Diagnosis not present

## 2023-11-03 DIAGNOSIS — E1122 Type 2 diabetes mellitus with diabetic chronic kidney disease: Secondary | ICD-10-CM | POA: Diagnosis not present

## 2023-11-03 LAB — CBC WITH DIFFERENTIAL/PLATELET
Basophils Absolute: 0.1 10*3/uL (ref 0.0–0.1)
Basophils Relative: 0.5 % (ref 0.0–3.0)
Eosinophils Absolute: 0.1 10*3/uL (ref 0.0–0.7)
Eosinophils Relative: 0.9 % (ref 0.0–5.0)
HCT: 45.6 % (ref 39.0–52.0)
Hemoglobin: 15.7 g/dL (ref 13.0–17.0)
Lymphocytes Relative: 17.3 % (ref 12.0–46.0)
Lymphs Abs: 1.8 10*3/uL (ref 0.7–4.0)
MCHC: 34.3 g/dL (ref 30.0–36.0)
MCV: 96.2 fL (ref 78.0–100.0)
Monocytes Absolute: 0.6 10*3/uL (ref 0.1–1.0)
Monocytes Relative: 6.2 % (ref 3.0–12.0)
Neutro Abs: 7.8 10*3/uL — ABNORMAL HIGH (ref 1.4–7.7)
Neutrophils Relative %: 75.1 % (ref 43.0–77.0)
Platelets: 207 10*3/uL (ref 150.0–400.0)
RBC: 4.74 Mil/uL (ref 4.22–5.81)
RDW: 15.3 % (ref 11.5–15.5)
WBC: 10.3 10*3/uL (ref 4.0–10.5)

## 2023-11-03 LAB — COMPREHENSIVE METABOLIC PANEL
ALT: 31 U/L (ref 0–53)
AST: 18 U/L (ref 0–37)
Albumin: 3.8 g/dL (ref 3.5–5.2)
Alkaline Phosphatase: 102 U/L (ref 39–117)
BUN: 36 mg/dL — ABNORMAL HIGH (ref 6–23)
CO2: 24 meq/L (ref 19–32)
Calcium: 9.1 mg/dL (ref 8.4–10.5)
Chloride: 100 meq/L (ref 96–112)
Creatinine, Ser: 1.55 mg/dL — ABNORMAL HIGH (ref 0.40–1.50)
GFR: 42.62 mL/min — ABNORMAL LOW (ref >60.00)
Glucose, Bld: 359 mg/dL — ABNORMAL HIGH (ref 70–99)
Potassium: 4.6 meq/L (ref 3.5–5.1)
Sodium: 135 meq/L (ref 135–145)
Total Bilirubin: 0.5 mg/dL (ref 0.2–1.2)
Total Protein: 5.9 g/dL — ABNORMAL LOW (ref 6.0–8.3)

## 2023-11-03 LAB — POCT URINE DIPSTICK
Bilirubin, UA: NEGATIVE
Glucose, UA: >=1000 mg/dL — AB
Ketones, POC UA: NEGATIVE mg/dL
Leukocytes, UA: NEGATIVE
Nitrite, UA: NEGATIVE
Spec Grav, UA: 1.02 (ref 1.010–1.025)
Urobilinogen, UA: 0.2 U/dL
pH, UA: 5 (ref 5.0–8.0)

## 2023-11-03 LAB — HEMOGLOBIN A1C: Hgb A1c MFr Bld: 12.4 % — ABNORMAL HIGH (ref 4.6–6.5)

## 2023-11-03 LAB — PSA: PSA: 0.22 ng/mL (ref 0.10–4.00)

## 2023-11-03 LAB — TSH: TSH: 2.98 u[IU]/mL (ref 0.35–5.50)

## 2023-11-03 LAB — VITAMIN B12: Vitamin B-12: >1537 pg/mL — ABNORMAL HIGH (ref 211–911)

## 2023-11-03 MED ORDER — NYSTATIN 100000 UNIT/ML MT SUSP: 5.0000 mL | Freq: Four times a day (QID) | OROMUCOSAL | 0 refills | Status: DC

## 2023-11-03 NOTE — Assessment & Plan Note (Addendum)
 With new urinary frequency and urinary retention Not on SGLT2, urine reassuring for possible DKA as without ketones Will obtain A1c and cmp glucose Pt advised on red flag precautions No leukocytes, low likelihood of infection however will send for u/a culture

## 2023-11-03 NOTE — Progress Notes (Signed)
 Established Patient Office Visit  Subjective:   Patient ID: Thomas Carlson, male    DOB: 1946-03-01  Age: 78 y.o. MRN: [id number]  CC:  Chief Complaint  Patient presents with   Acute Visit    Has lost his sense of taste x2 weeks. Also having increased fatigue and has noticed a foul odor to his urine.    HPI: Thomas Carlson is a 78 y.o. male presenting on 11/03/2023 for Acute Visit (Has lost his sense of taste x2 weeks. Also having increased fatigue and has noticed a foul odor to his urine.)  Lost his sense of taste about three weeks ago  Fatigued more than usual, energy has hit 'bottom'  Also reports urinary retention with urinary frequency, this has been for the last two weeks. No dysuria. No back pain. No fever or chills. Denies upper respiratory infection symptoms to include nasal congestion, ear pain, sore throat, or cough. Feels like a film on the roof of his mouth. Also losing his voice slightly.  No change in smell.   Lab Results  Component Value Date   HGBA1C 6.9 (A) 03/18/2023       ROS: Negative unless specifically indicated above in HPI.   Relevant past medical history reviewed and updated as indicated.   Allergies and medications reviewed and updated.   Current Outpatient Medications:    atenolol  (TENORMIN ) 50 MG tablet, Take 50 mg by mouth daily., Disp: , Rfl:    nystatin  (MYCOSTATIN ) 100000 UNIT/ML suspension, Take 5 mLs (500,000 Units total) by mouth 4 (four) times daily., Disp: 60 mL, Rfl: 0   acyclovir  (ZOVIRAX ) 400 MG tablet, TAKE 1 TABLET EVERY DAY, Disp: 90 tablet, Rfl: 1   allopurinol  (ZYLOPRIM ) 100 MG tablet, TAKE 2 TABLETS EVERY DAY (NEED MD APPOINTMENT FOR REFILLS), Disp: 180 tablet, Rfl: 0   aspirin  EC 81 MG tablet, Take 1 tablet (81 mg total) by mouth daily., Disp: , Rfl:    Biotin  1000 MCG tablet, Take 1,000 mcg by mouth daily. , Disp: , Rfl:    budesonide  (ENTOCORT EC ) 3 MG 24 hr capsule, Take 3 capsules (9 mg total) by mouth daily., Disp: 90  capsule, Rfl: 1   budesonide  (ENTOCORT EC ) 3 MG 24 hr capsule, Take 1 capsule (3 mg total) by mouth 3 (three) times daily., Disp: 270 capsule, Rfl: 0   Cholecalciferol  (VITAMIN D3) 25 MCG (1000 UT) CAPS, Take 1 capsule (1,000 Units total) by mouth daily., Disp: 30 capsule, Rfl:    Cyanocobalamin  (B-12) 1000 MCG SUBL, Place 1 tablet under the tongue daily., Disp: , Rfl:    losartan  (COZAAR ) 50 MG tablet, Take 1 tablet (50 mg total) by mouth daily., Disp: 90 tablet, Rfl: 0   lovastatin  (MEVACOR ) 40 MG tablet, TAKE 2 TABLETS AT BEDTIME, Disp: 180 tablet, Rfl: 3   Magnesium  500 MG TABS, Take 500 mg by mouth daily., Disp: , Rfl:    metFORMIN  (GLUCOPHAGE -XR) 500 MG 24 hr tablet, TAKE 1 TABLET EVERY DAY WITH BREAKFAST, Disp: 90 tablet, Rfl: 3  Allergies  Allergen Reactions   Gabapentin Other (See Comments)    unknown   Bactrim [Sulfamethoxazole-Trimethoprim] Rash    Diffuse drug reaction - maculopapular rash   Penicillins Hives    Has patient had a PCN reaction causing immediate rash, facial/tongue/throat swelling, SOB or lightheadedness with hypotension: Unknown Has patient had a PCN reaction causing severe rash involving mucus membranes or skin necrosis: Unknown Has patient had a PCN reaction that required hospitalization: Unknown Has  patient had a PCN reaction occurring within the last 10 years: Unknown If all of the above answers are NO, then may proceed with Cephalosporin use.    Lasix  [Furosemide ] Other (See Comments)    Drops blood pressure and drained potassium and magnesium     Objective:   BP (!) 144/92 (BP Location: Left Arm, Patient Position: Sitting, Cuff Size: Normal)   Pulse 76   Temp 98 F (36.7 C) (Temporal)   Ht 5' 8.5 (1.74 m)   Wt 235 lb 12.8 oz (107 kg)   SpO2 95%   BMI 35.33 kg/m    Physical Exam Vitals reviewed.  Constitutional:      General: He is not in acute distress.    Appearance: Normal appearance. He is obese. He is not ill-appearing, toxic-appearing  or diaphoretic.  HENT:     Head: Normocephalic.     Right Ear: Tympanic membrane normal.     Left Ear: Tympanic membrane normal.     Nose: Nose normal.     Mouth/Throat:     Mouth: Mucous membranes are moist. Oral lesions (white budding on base of erythema) present.  Eyes:     Pupils: Pupils are equal, round, and reactive to light.  Cardiovascular:     Rate and Rhythm: Normal rate and regular rhythm.  Pulmonary:     Effort: Pulmonary effort is normal.     Breath sounds: Normal breath sounds. No wheezing.  Abdominal:     Tenderness: There is no abdominal tenderness. There is no guarding.  Musculoskeletal:        General: Normal range of motion.     Cervical back: Normal range of motion.  Neurological:     General: No focal deficit present.     Mental Status: He is alert and oriented to person, place, and time. Mental status is at baseline.  Psychiatric:        Mood and Affect: Mood normal.        Behavior: Behavior normal.        Thought Content: Thought content normal.        Judgment: Judgment normal.     Assessment & Plan:  Urinary frequency -     POCT URINE DIPSTICK -     Urinalysis w microscopic + reflex cultur -     PSA  Loss of taste Assessment & Plan: Thrush on exam.  Check b12 today, consider zinc .  Pending results.  Treating with nystatin  5 mg QID x 7 days   Oral thrush Assessment & Plan: Rx nystatin  5 ml QID x 7 days  Orders: -     Nystatin ; Take 5 mLs (500,000 Units total) by mouth 4 (four) times daily.  Dispense: 60 mL; Refill: 0  Other fatigue Assessment & Plan: Number of possible factors, thyroid  disease, infection, low b12, dka (low possibility, no ketones in urine) Ordering tsh, b12, cbc, cmp pending results  Orders: -     TSH -     CBC with Differential/Platelet  Glucose found in urine on examination Assessment & Plan: With new urinary frequency and urinary retention Not on SGLT2, urine reassuring for possible DKA as without  ketones Will obtain A1c and cmp glucose Pt advised on red flag precautions No leukocytes, low likelihood of infection however will send for u/a culture   Orders: -     Hemoglobin A1c -     Comprehensive metabolic panel -     Urinalysis w microscopic + reflex cultur  Type 2 diabetes  mellitus with other specified complication, without long-term current use of insulin  (HCC) -     Hemoglobin A1c -     Comprehensive metabolic panel  CKD stage 3 due to type 2 diabetes mellitus (HCC) -     Comprehensive metabolic panel  Low serum vitamin B12 -     Vitamin B12  BPH without obstruction/lower urinary tract symptoms -     PSA     Follow up plan: Return in about 1 week (around 11/10/2023) for with Dr. rilla for fatigue.  Ginger Patrick, FNP

## 2023-11-03 NOTE — Assessment & Plan Note (Signed)
 Thrush on exam.  Check b12 today, consider zinc.  Pending results.  Treating with nystatin 5 mg QID x 7 days

## 2023-11-03 NOTE — Assessment & Plan Note (Signed)
 Rx nystatin 5 ml QID x 7 days

## 2023-11-03 NOTE — Assessment & Plan Note (Signed)
 Number of possible factors, thyroid disease, infection, low b12, dka (low possibility, no ketones in urine) Ordering tsh, b12, cbc, cmp pending results

## 2023-11-04 ENCOUNTER — Other Ambulatory Visit: Payer: Self-pay | Admitting: Family

## 2023-11-04 DIAGNOSIS — E1169 Type 2 diabetes mellitus with other specified complication: Secondary | ICD-10-CM

## 2023-11-04 LAB — URINALYSIS W MICROSCOPIC + REFLEX CULTURE
Bacteria, UA: NONE SEEN /[HPF]
Bilirubin Urine: NEGATIVE
Hgb urine dipstick: NEGATIVE
Ketones, ur: NEGATIVE
Leukocyte Esterase: NEGATIVE
Nitrites, Initial: NEGATIVE
RBC / HPF: NONE SEEN /[HPF] (ref 0–2)
Specific Gravity, Urine: 1.034 (ref 1.001–1.035)
WBC, UA: NONE SEEN /[HPF] (ref 0–5)
pH: <=5 (ref 5.0–8.0)

## 2023-11-04 LAB — NO CULTURE INDICATED

## 2023-11-04 MED ORDER — LANCETS MISC. MISC: 1.0000 | Freq: Three times a day (TID) | 0 refills | Status: AC

## 2023-11-04 MED ORDER — BLOOD GLUCOSE TEST VI STRP: 1.0000 | ORAL_STRIP | Freq: Three times a day (TID) | 0 refills | Status: DC

## 2023-11-04 MED ORDER — LANTUS SOLOSTAR 100 UNIT/ML ~~LOC~~ SOPN: 10.0000 [IU] | PEN_INJECTOR | Freq: Every day | SUBCUTANEOUS | 99 refills | Status: DC

## 2023-11-04 MED ORDER — LANCET DEVICE MISC: 1.0000 | Freq: Three times a day (TID) | 0 refills | Status: AC

## 2023-11-04 MED ORDER — BLOOD GLUCOSE MONITORING SUPPL DEVI: 1.0000 | Freq: Three times a day (TID) | 0 refills | Status: DC

## 2023-11-04 NOTE — Progress Notes (Signed)
 High glucose confirmed In the urine.  B12 is high as well, is he taking otc? If so I would dial back on the dose.  Diabetes, A1c shot up from seven months ago which makes sense for symptoms. We have to be cautious as we could potentially be at the start of DKA (diabetic ketoacidosis)   We are going to start on 10 units insulin  nightly to see if this helps.  Does he have a glucose monitor? If not I will send one because I want him to check his glucose at least once daily in the am fasting.  He will need to drink lots of fluids to try to flush out the glucose from the urine.   Make a follow up appt with Dr. KANDICE in one week.   Monitor closely for symptoms of Dka. If any of these are worsening and or starting pt should go to ER to get some fluids.               Excessive thirst  Frequent urination  Nausea and vomiting  Stomach pain  Weakness or fatigue  Shortness of breath  Fruity-scented breath  Confusion  Did attempt to call pt and left voicemail 11/03/22 at 815.  Please try again to call a bit later.

## 2023-11-04 NOTE — Progress Notes (Signed)
 Sent in for pharmacy

## 2023-11-12 ENCOUNTER — Encounter: Payer: Self-pay | Admitting: Family Medicine

## 2023-11-12 ENCOUNTER — Ambulatory Visit: Payer: Medicare HMO | Admitting: Family Medicine

## 2023-11-12 VITALS — BP 128/68 | HR 69 | Temp 97.7°F | Ht 68.5 in | Wt 236.0 lb

## 2023-11-12 DIAGNOSIS — E538 Deficiency of other specified B group vitamins: Secondary | ICD-10-CM

## 2023-11-12 DIAGNOSIS — R432 Parageusia: Secondary | ICD-10-CM | POA: Diagnosis not present

## 2023-11-12 DIAGNOSIS — I350 Nonrheumatic aortic (valve) stenosis: Secondary | ICD-10-CM

## 2023-11-12 DIAGNOSIS — E1169 Type 2 diabetes mellitus with other specified complication: Secondary | ICD-10-CM | POA: Diagnosis not present

## 2023-11-12 DIAGNOSIS — Z794 Long term (current) use of insulin: Secondary | ICD-10-CM | POA: Diagnosis not present

## 2023-11-12 DIAGNOSIS — B370 Candidal stomatitis: Secondary | ICD-10-CM

## 2023-11-12 DIAGNOSIS — R5383 Other fatigue: Secondary | ICD-10-CM | POA: Diagnosis not present

## 2023-11-12 MED ORDER — FREESTYLE LIBRE 3 SENSOR MISC: 3 refills | Status: DC

## 2023-11-12 MED ORDER — NYSTATIN 100000 UNIT/ML MT SUSP: 5.0000 mL | Freq: Four times a day (QID) | OROMUCOSAL | 0 refills | Status: DC

## 2023-11-12 MED ORDER — INSULIN PEN NEEDLE 32G X 5 MM MISC: 0 refills | Status: DC

## 2023-11-12 MED ORDER — PEN NEEDLES 31G X 5 MM MISC: 3 refills | Status: AC

## 2023-11-12 MED ORDER — B-12 1000 MCG SL SUBL: 1.0000 | SUBLINGUAL_TABLET | SUBLINGUAL | Status: AC

## 2023-11-12 MED ORDER — METFORMIN HCL ER 500 MG PO TB24: 1000.0000 mg | ORAL_TABLET | Freq: Every day | ORAL | 1 refills | Status: DC

## 2023-11-12 NOTE — Progress Notes (Signed)
Ph: [phone] Fax: [phone]   Patient ID: Thomas Carlson, male    DOB: 11-26-1945, 78 y.o.   MRN: [id number]  This visit was conducted in person.  BP 128/68   Pulse 69   Temp 97.7 F (36.5 C) (Oral)   Ht 5' 8.5" (1.74 m)   Wt 236 lb (107 kg)   SpO2 94%   BMI 35.36 kg/m    CC: 1 wk f/u visit  Subjective:   HPI: Thomas Carlson is a 78 y.o. male presenting on 11/12/2023 for Fatigue (Here for 1 wk f/u, per [place]. Also, wants to discuss nystatin. )   Saw provider last week with concerns for loss on sense of taste with thrush and fatigue and foul odor to urine with urine retention and frequency. UA showed large glucose with protein, sugar 359, Cr 1.55 with GFR 42 (stable) and A1c was markedly elevated up to 12.4%. started on lantus 10u daily - just got this ([facility] was out of stock). Took 1 dose so far. This week sugars have ranged 200-279s.  Hoarse voice present for several weeks since thrush. No GERD symptoms, reflux, heartburn or dysphagia.  No abd pain, fevers/chills, skin infection, cough, UTI symptoms of dysuria.  Thrush - treated with nystatin suspension with limited benefit.   This was despite metformin XR 500mg  daily. Prior A1c 6.9% (03/18/2023).  He had been on significant amounts of budesonide since 08/2023 (3 month taper 3mg  TID dropping to BID then every day each month).      Relevant past medical, surgical, family and social history reviewed and updated as indicated. Interim medical history since our last visit reviewed. Allergies and medications reviewed and updated. Outpatient Medications Prior to Visit  Medication Sig Dispense Refill   acyclovir (ZOVIRAX) 400 MG tablet TAKE 1 TABLET EVERY DAY 90 tablet 1   allopurinol (ZYLOPRIM) 100 MG tablet TAKE 2 TABLETS EVERY DAY (NEED MD APPOINTMENT FOR REFILLS) 180 tablet 0   aspirin EC 81 MG tablet Take 1 tablet (81 mg total) by mouth daily.     atenolol (TENORMIN) 50 MG tablet Take 50 mg by mouth daily.      Biotin 1000 MCG tablet Take 1,000 mcg by mouth daily.      Blood Glucose Monitoring Suppl DEVI 1 each by Does not apply route in the morning, at noon, and at bedtime. May substitute to any manufacturer covered by patient's insurance. 1 each 0   budesonide (ENTOCORT EC) 3 MG 24 hr capsule Take 1 capsule (3 mg total) by mouth 3 (three) times daily. 270 capsule 0   Cholecalciferol (VITAMIN D3) 25 MCG (1000 UT) CAPS Take 1 capsule (1,000 Units total) by mouth daily. 30 capsule    Glucose Blood (BLOOD GLUCOSE TEST STRIPS) STRP 1 each by In Vitro route in the morning, at noon, and at bedtime. May substitute to any manufacturer covered by patient's insurance. 100 strip 0   insulin glargine (LANTUS SOLOSTAR) 100 UNIT/ML Solostar Pen Inject 10 Units into the skin daily. 15 mL PRN   Lancet Device MISC 1 each by Does not apply route in the morning, at noon, and at bedtime. May substitute to any manufacturer covered by patient's insurance. 1 each 0   Lancets Misc. MISC 1 each by Does not apply route in the morning, at noon, and at bedtime. May substitute to any manufacturer covered by patient's insurance. 100 each 0   losartan (COZAAR) 50 MG tablet Take 1 tablet (50 mg total) by mouth  daily. 90 tablet 0   lovastatin (MEVACOR) 40 MG tablet TAKE 2 TABLETS AT BEDTIME 180 tablet 3   Magnesium 500 MG TABS Take 500 mg by mouth daily.     Cyanocobalamin (B-12) 1000 MCG SUBL Place 1 tablet under the tongue daily.     metFORMIN (GLUCOPHAGE-XR) 500 MG 24 hr tablet TAKE 1 TABLET EVERY DAY WITH BREAKFAST 90 tablet 3   nystatin (MYCOSTATIN) 100000 UNIT/ML suspension Take 5 mLs (500,000 Units total) by mouth 4 (four) times daily. 60 mL 0   No facility-administered medications prior to visit.     Per HPI unless specifically indicated in ROS section below Review of Systems  Objective:  BP 128/68   Pulse 69   Temp 97.7 F (36.5 C) (Oral)   Ht 5' 8.5" (1.74 m)   Wt 236 lb (107 kg)   SpO2 94%   BMI 35.36 kg/m    Wt Readings from Last 3 Encounters:  11/12/23 236 lb (107 kg)  11/03/23 235 lb 12.8 oz (107 kg)  09/09/23 239 lb 12.8 oz (108.8 kg)      Physical Exam Vitals and nursing note reviewed.  Constitutional:      Appearance: Normal appearance. He is not ill-appearing.  HENT:     Mouth/Throat:     Mouth: Mucous membranes are moist.     Pharynx: Oropharynx is clear. No posterior oropharyngeal erythema.     Comments: Mild plaque to posterior tongue  Eyes:     Extraocular Movements: Extraocular movements intact.     Conjunctiva/sclera: Conjunctivae normal.     Pupils: Pupils are equal, round, and reactive to light.  Neck:     Thyroid: No thyroid mass or thyromegaly.  Cardiovascular:     Rate and Rhythm: Normal rate and regular rhythm.     Pulses: Normal pulses.     Heart sounds: Murmur (4/6 systolic throughout) heard.  Pulmonary:     Effort: Pulmonary effort is normal. No respiratory distress.     Breath sounds: Normal breath sounds. No wheezing, rhonchi or rales.  Musculoskeletal:     Cervical back: Normal range of motion and neck supple. No rigidity.     Right lower leg: No edema.     Left lower leg: No edema.     Comments: See HPI for foot exam if done  Lymphadenopathy:     Cervical: No cervical adenopathy.  Skin:    General: Skin is warm and dry.     Findings: No rash.  Neurological:     Mental Status: He is alert.  Psychiatric:        Mood and Affect: Mood normal.        Behavior: Behavior normal.       Results for orders placed or performed in visit on 11/03/23  POCT URINE DIPSTICK   Collection Time: 11/03/23 11:57 AM  Result Value Ref Range   Color, UA yellow yellow   Clarity, UA clear clear   Glucose, UA >=1,000 (A) negative mg/dL   Bilirubin, UA negative negative   Ketones, POC UA negative negative mg/dL   Spec Grav, UA 1.610 9.604 - 1.025   Blood, UA small (A) negative   pH, UA 5.0 5.0 - 8.0   POC PROTEIN,UA trace (A) negative, trace   Urobilinogen, UA 0.2  0.2 or 1.0 E.U./dL   Nitrite, UA Negative Negative   Leukocytes, UA Negative Negative  Vitamin B12   Collection Time: 11/03/23 12:10 PM  Result Value Ref Range   Vitamin  B-12 >1537 (H) 211 - 911 pg/mL  Hemoglobin A1c   Collection Time: 11/03/23 12:10 PM  Result Value Ref Range   Hgb A1c MFr Bld 12.4 (H) 4.6 - 6.5 %  TSH   Collection Time: 11/03/23 12:10 PM  Result Value Ref Range   TSH 2.98 0.35 - 5.50 uIU/mL  CBC with Differential   Collection Time: 11/03/23 12:10 PM  Result Value Ref Range   WBC 10.3 4.0 - 10.5 K/uL   RBC 4.74 4.22 - 5.81 Mil/uL   Hemoglobin 15.7 13.0 - 17.0 g/dL   HCT 40.9 81.1 - 91.4 %   MCV 96.2 78.0 - 100.0 fl   MCHC 34.3 30.0 - 36.0 g/dL   RDW 78.2 95.6 - 21.3 %   Platelets 207.0 150.0 - 400.0 K/uL   Neutrophils Relative % 75.1 43.0 - 77.0 %   Lymphocytes Relative 17.3 12.0 - 46.0 %   Monocytes Relative 6.2 3.0 - 12.0 %   Eosinophils Relative 0.9 0.0 - 5.0 %   Basophils Relative 0.5 0.0 - 3.0 %   Neutro Abs 7.8 (H) 1.4 - 7.7 K/uL   Lymphs Abs 1.8 0.7 - 4.0 K/uL   Monocytes Absolute 0.6 0.1 - 1.0 K/uL   Eosinophils Absolute 0.1 0.0 - 0.7 K/uL   Basophils Absolute 0.1 0.0 - 0.1 K/uL  Comprehensive metabolic panel   Collection Time: 11/03/23 12:10 PM  Result Value Ref Range   Sodium 135 135 - 145 mEq/L   Potassium 4.6 3.5 - 5.1 mEq/L   Chloride 100 96 - 112 mEq/L   CO2 24 19 - 32 mEq/L   Glucose, Bld 359 (H) 70 - 99 mg/dL   BUN 36 (H) 6 - 23 mg/dL   Creatinine, Ser 0.86 (H) 0.40 - 1.50 mg/dL   Total Bilirubin 0.5 0.2 - 1.2 mg/dL   Alkaline Phosphatase 102 39 - 117 U/L   AST 18 0 - 37 U/L   ALT 31 0 - 53 U/L   Total Protein 5.9 (L) 6.0 - 8.3 g/dL   Albumin 3.8 3.5 - 5.2 g/dL   GFR 57.84 (L) >69.62 mL/min   Calcium 9.1 8.4 - 10.5 mg/dL  Urinalysis w microscopic + reflex cultur   Collection Time: 11/03/23 12:10 PM   Specimen: Blood  Result Value Ref Range   Color, Urine YELLOW YELLOW   APPearance CLEAR CLEAR   Specific Gravity, Urine 1.034  1.001 - 1.035   pH < OR = 5.0 5.0 - 8.0   Glucose, UA 3+ (A) NEGATIVE   Bilirubin Urine NEGATIVE NEGATIVE   Ketones, ur NEGATIVE NEGATIVE   Hgb urine dipstick NEGATIVE NEGATIVE   Protein, ur 1+ (A) NEGATIVE   Nitrites, Initial NEGATIVE NEGATIVE   Leukocyte Esterase NEGATIVE NEGATIVE   WBC, UA NONE SEEN 0 - 5 /HPF   RBC / HPF NONE SEEN 0 - 2 /HPF   Squamous Epithelial / HPF 0-5 < OR = 5 /HPF   Bacteria, UA NONE SEEN NONE SEEN /HPF   Hyaline Cast 0-5 (A) NONE SEEN /LPF   Note    PSA   Collection Time: 11/03/23 12:10 PM  Result Value Ref Range   PSA 0.22 0.10 - 4.00 ng/mL  REFLEXIVE URINE CULTURE   Collection Time: 11/03/23 12:10 PM  Result Value Ref Range   Reflexve Urine Culture      Assessment & Plan:   Problem List Items Addressed This Visit     Type 2 diabetes mellitus with other specified complication (HCC) -  Primary   Chronic but deteriorated in setting of prolonged budesonide 3 month taper through GI for flare of lymphocytic colitis, currently on 3mg  BID dosing this month with plan for 1 more month of budesonide taper. Discussed with patient. Will increase metformin to 500mg  BID. He was also recently started on lantus but hasn't taken consistently yet - ERx pen needles. Reviewed titration schedule of 2 units every 3 days if average fasting sugar staying >150. RTC 3-4 wks close f/u. ERx CGM Freestyle Libre 3 printed Rx to take to pharmacy, now that he's on insulin.       Relevant Medications   metFORMIN (GLUCOPHAGE-XR) 500 MG 24 hr tablet   Aortic stenosis   Chronic, known moderate aortic stenosis by echo 09/2022, again heard today.       Low serum vitamin B12   Levels high - will drop to once weekly dosing.       Other fatigue   Fatigue described as marked leg heaviness with exertion - ?related to recent hyperglycemia- reassess with better sugar control.       Oral thrush   Recently treated for this.  Hoarse voice may be related to this.  Refill nystatin  solution, reassess after treatment.       Relevant Medications   nystatin (MYCOSTATIN) 100000 UNIT/ML suspension   Loss of taste   Attributed to thrush - reassess after treatment.         Meds ordered this encounter  Medications   Cyanocobalamin (B-12) 1000 MCG SUBL    Sig: Place 1 tablet under the tongue once a week.   metFORMIN (GLUCOPHAGE-XR) 500 MG 24 hr tablet    Sig: Take 2 tablets (1,000 mg total) by mouth daily with breakfast.    Dispense:  180 tablet    Refill:  1    Note new dose   Insulin Pen Needle (PEN NEEDLES) 31G X 5 MM MISC    Sig: Use as directed to inject insulin daily    Dispense:  300 each    Refill:  3   Insulin Pen Needle 32G X 5 MM MISC    Sig: E11.69. Use as directed to inject insulin daily    Dispense:  50 each    Refill:  0   Continuous Glucose Sensor (FREESTYLE LIBRE 3 SENSOR) MISC    Sig: Place 1 sensor on the skin every 14 days. Use to check glucose continuously E11.69, Z79.4    Dispense:  2 each    Refill:  3   nystatin (MYCOSTATIN) 100000 UNIT/ML suspension    Sig: Take 5 mLs (500,000 Units total) by mouth 4 (four) times daily.    Dispense:  120 mL    Refill:  0    No orders of the defined types were placed in this encounter.   Patient Instructions  Push water intake.  Increase metformin XR to 500mg  2 tablets in the morning Continue insulin lantus 10 units daily, titrating up by 2 units every 3 days if average fasting sugar staying >150 to max 30 units/day - update me with how you're doing.  Try freestyle libre 3 sensor sent to pharmacy  Nystatin solution refilled.  Return for physical, return for follow up in 3-4 weeks. Let me know sooner if any questions or concerns.  Drop b12 to once weekly.   Follow up plan: No follow-ups on file.  Eustaquio Boyden, MD

## 2023-11-12 NOTE — Patient Instructions (Addendum)
 Push water  intake.  Increase metformin  XR to 500mg  2 tablets in the morning Continue insulin  lantus  10 units daily, titrating up by 2 units every 3 days if average fasting sugar staying >150 to max 30 units/day - update me with how you're doing.  Try freestyle libre 3 sensor sent to pharmacy  Nystatin  solution refilled.  Return for physical, return for follow up in 3-4 weeks. Let me know sooner if any questions or concerns.  Drop b12 to once weekly.

## 2023-11-13 ENCOUNTER — Encounter: Payer: Self-pay | Admitting: Family Medicine

## 2023-11-13 NOTE — Assessment & Plan Note (Signed)
Chronic, known moderate aortic stenosis by echo 09/2022, again heard today.

## 2023-11-13 NOTE — Assessment & Plan Note (Signed)
Fatigue described as marked leg heaviness with exertion - ?related to recent hyperglycemia- reassess with better sugar control.

## 2023-11-13 NOTE — Assessment & Plan Note (Signed)
Levels high - will drop to once weekly dosing.

## 2023-11-13 NOTE — Assessment & Plan Note (Signed)
Attributed to thrush - reassess after treatment.

## 2023-11-13 NOTE — Assessment & Plan Note (Signed)
Chronic but deteriorated in setting of prolonged budesonide 3 month taper through GI for flare of lymphocytic colitis, currently on 3mg  BID dosing this month with plan for 1 more month of budesonide taper. Discussed with patient. Will increase metformin to 500mg  BID. He was also recently started on lantus but hasn't taken consistently yet - ERx pen needles. Reviewed titration schedule of 2 units every 3 days if average fasting sugar staying >150. RTC 3-4 wks close f/u. ERx CGM Freestyle Libre 3 printed Rx to take to pharmacy, now that he's on insulin.

## 2023-11-13 NOTE — Assessment & Plan Note (Signed)
Recently treated for this.  Hoarse voice may be related to this.  Refill nystatin solution, reassess after treatment.

## 2023-11-25 ENCOUNTER — Telehealth: Payer: Self-pay

## 2023-11-25 NOTE — Telephone Encounter (Signed)
Copied from CRM [id number]. Topic: Clinical - Medical Advice >> Nov 25, 2023  8:37 AM [name] wrote: Reason for CRM: Patient states he has completed treatment for thrash and still having loss of taste, patient wants to know if provider will give him more medication for issue or does  he need to come in.  Thomas Carlson [phone] or MyChart

## 2023-11-25 NOTE — Telephone Encounter (Signed)
Copied from CRM [id number]. Topic: Clinical - Medication Question >> Nov 25, 2023  8:44 AM [name] wrote: Reason for CRM: Patient is asking how does he know when there aren't anymore doses left in the insulin glargine (LANTUS SOLOSTAR) 100 UNIT/ML Solostar Pen.   Patient also asking if he can have assistance with troubleshooting his Accucheck, stating there's no readings coming up and receiving error messages and can't keep track of his sugar levels.  Please reach out to patient, thank you.  Emori [phone]

## 2023-11-25 NOTE — Telephone Encounter (Signed)
Spoke with pt to discuss Lantus pen. Informed him, pen usually will not let you dial the dose he is to take when it's empty or doesn't have enough. Pt verbalizes understanding.   Also, walked pt through checking BS with meter, reminding him to wipe away 1st drop of blood and test the 2nd drop. Pt verbalized understanding and was able to get a reading. Pt expresses his thanks for the help.   Pt also wants to make Dr Reece Agar aware that he still has no taste after taking nystatin. Plz advise.

## 2023-11-27 NOTE — Telephone Encounter (Signed)
How are sugars running? How much lantus is he now up to? Could be diabetes related effect. How is hoarse voice? We do have f/u scheduled 12/10/2023 and can reassess at that time likely with further labwork if persists. Alternatively he could schedule sooner appt with me if desires sooner evaluation. I could fit in one day at 1pm.

## 2023-11-27 NOTE — Telephone Encounter (Signed)
Spoke with pt asking about BS readings. States they had been >150 three consecutive days, so he increased dose, by 2 U per Dr Reece Agar, to 12 U daily. However, readings were still >150 for another 3 consecutive days, so increased again to 14 U. Readings are better and pt is holding at 14 U daily. C/o ongoing hoarseness, for about 6 mos now. I relayed Dr Timoteo Expose message. Pt verbalizes understanding and prefers to keep 12/10/23 OV.

## 2023-12-10 ENCOUNTER — Encounter: Payer: Self-pay | Admitting: Family Medicine

## 2023-12-10 ENCOUNTER — Ambulatory Visit (INDEPENDENT_AMBULATORY_CARE_PROVIDER_SITE_OTHER): Payer: Medicare HMO | Admitting: Family Medicine

## 2023-12-10 VITALS — BP 144/72 | HR 72 | Temp 97.8°F | Ht 68.5 in | Wt 240.1 lb

## 2023-12-10 DIAGNOSIS — E1169 Type 2 diabetes mellitus with other specified complication: Secondary | ICD-10-CM

## 2023-12-10 DIAGNOSIS — Z794 Long term (current) use of insulin: Secondary | ICD-10-CM

## 2023-12-10 DIAGNOSIS — N183 Chronic kidney disease, stage 3 unspecified: Secondary | ICD-10-CM | POA: Diagnosis not present

## 2023-12-10 DIAGNOSIS — E1122 Type 2 diabetes mellitus with diabetic chronic kidney disease: Secondary | ICD-10-CM | POA: Diagnosis not present

## 2023-12-10 DIAGNOSIS — B370 Candidal stomatitis: Secondary | ICD-10-CM | POA: Diagnosis not present

## 2023-12-10 DIAGNOSIS — R432 Parageusia: Secondary | ICD-10-CM | POA: Diagnosis not present

## 2023-12-10 DIAGNOSIS — R490 Dysphonia: Secondary | ICD-10-CM | POA: Diagnosis not present

## 2023-12-10 DIAGNOSIS — E538 Deficiency of other specified B group vitamins: Secondary | ICD-10-CM | POA: Diagnosis not present

## 2023-12-10 DIAGNOSIS — I10 Essential (primary) hypertension: Secondary | ICD-10-CM

## 2023-12-10 NOTE — Assessment & Plan Note (Signed)
Reviewed with patient. Uppate renal panel.

## 2023-12-10 NOTE — Assessment & Plan Note (Addendum)
Chronic recently deteriorated in setting of budesonide course.  Significant improvement based on fingerstick cbg's at home since starting lantus up to 14u daily as well as metformin XR 1000mg  daily.  Continue current regimen, although discussed that as he comes off budesonide he should monitor for hypoglycemia and let me know if this develops to taper down on insulin dose or metformin dose (likely metformin given CKD).  Has not yet started CGM Freestyle [place] 3

## 2023-12-10 NOTE — Assessment & Plan Note (Signed)
Fluctuating, may be improving.  Continue to monitor. Benign exam today - no significant GERD< allergic rhinitis, alcohol or smoking history.

## 2023-12-10 NOTE — Assessment & Plan Note (Signed)
Update levels on lower once weekly dosing.

## 2023-12-10 NOTE — Assessment & Plan Note (Signed)
Chronic, adequate on atenolol and losartan.

## 2023-12-10 NOTE — Assessment & Plan Note (Signed)
This started post-COVID 07/2023 - anticipate COVID sequelae. Discussed with patient, anticipate improvement over next few months. Benign exam today.

## 2023-12-10 NOTE — Patient Instructions (Addendum)
Labs today recheck kidney function and fructosamine  Continue monitoring sugar levels Loss of taste likely post- COVID side effect - hopefully continues improving over next several months Let us know if hoarseness worsening to consider ENT evaluation.  Return in 3 months for physical/medicare wellness visit

## 2023-12-10 NOTE — Assessment & Plan Note (Signed)
This is resolved

## 2023-12-10 NOTE — Progress Notes (Signed)
Ph: [phone] Fax: [phone]   Patient ID: Thomas Carlson, male    DOB: Jan 01, 1946, 78 y.o.   MRN: [id number]  This visit was conducted in person.  BP (!) 144/72 (BP Location: Right Arm, Cuff Size: Large)   Pulse 72   Temp 97.8 F (36.6 C) (Oral)   Ht 5' 8.5" (1.74 m)   Wt 240 lb 2 oz (108.9 kg)   SpO2 98%   BMI 35.98 kg/m    CC: 4 wk DM f/u visit  Subjective:   HPI: Thomas Carlson is a 78 y.o. male presenting on 12/10/2023 for Medical Management of Chronic Issues (Here for 4 wk DM f/I.)   See prior note for details.  Found to have acute deterioration of diabetic control attributed to chronic budesonide use. This was despite metformin XR 500mg  daily - does increased to 1000mg  in am. We also started lantus 10u daily - he slowly tapered up to 14u daily.   Notes ongoing hoarseness and lack of taste present for the past 6 months. Initially attributed to thrush however no improvement after treatment with nystatin. He notes some improvement in both.  No significant GERD symptoms or PNDrainage or alcohol/tobacco use.  No abd pain, nausea/vomiting. Diarrhea is better with budesonide taper Notes ongoing leg weakness but no fatigability.  He did have COVID 07/2023 treated with Paxlovid.   BP elevated today - despite atenolol 50mg  daily, losartan 50mg  daily.  DM - does regularly check sugars 90-120s. Compliant with antihyperglycemic regimen which includes: metformin and lantus as per above. Denies low sugars or hypoglycemic symptoms. Denies paresthesias, blurry vision. Last diabetic eye exam 02/2023. Glucometer brand: accuchek. Last foot exam: 02/2023. DSME: 2021.  Did not try Freestyle Libre 3 yet.  Lab Results  Component Value Date   HGBA1C 12.4 (H) 11/03/2023   Diabetic Foot Exam - Simple   No data filed    Lab Results  Component Value Date   MICROALBUR 1.0 09/11/2022        Relevant past medical, surgical, family and social history reviewed and updated as  indicated. Interim medical history since our last visit reviewed. Allergies and medications reviewed and updated. Outpatient Medications Prior to Visit  Medication Sig Dispense Refill   acyclovir (ZOVIRAX) 400 MG tablet TAKE 1 TABLET EVERY DAY 90 tablet 1   allopurinol (ZYLOPRIM) 100 MG tablet TAKE 2 TABLETS EVERY DAY (NEED MD APPOINTMENT FOR REFILLS) 180 tablet 0   aspirin EC 81 MG tablet Take 1 tablet (81 mg total) by mouth daily.     atenolol (TENORMIN) 50 MG tablet Take 50 mg by mouth daily.     Biotin 1000 MCG tablet Take 1,000 mcg by mouth daily.      Blood Glucose Monitoring Suppl DEVI 1 each by Does not apply route in the morning, at noon, and at bedtime. May substitute to any manufacturer covered by patient's insurance. 1 each 0   budesonide (ENTOCORT EC) 3 MG 24 hr capsule Take 1 capsule (3 mg total) by mouth 3 (three) times daily. 270 capsule 0   Cholecalciferol (VITAMIN D3) 25 MCG (1000 UT) CAPS Take 1 capsule (1,000 Units total) by mouth daily. 30 capsule    Continuous Glucose Sensor (FREESTYLE LIBRE 3 SENSOR) MISC Place 1 sensor on the skin every 14 days. Use to check glucose continuously E11.69, Z79.4 2 each 3   Cyanocobalamin (B-12) 1000 MCG SUBL Place 1 tablet under the tongue once a week.     insulin glargine (LANTUS  SOLOSTAR) 100 UNIT/ML Solostar Pen Inject 10 Units into the skin daily. 15 mL PRN   Insulin Pen Needle (PEN NEEDLES) 31G X 5 MM MISC Use as directed to inject insulin daily 300 each 3   Insulin Pen Needle 32G X 5 MM MISC E11.69. Use as directed to inject insulin daily 50 each 0   losartan (COZAAR) 50 MG tablet Take 1 tablet (50 mg total) by mouth daily. 90 tablet 0   lovastatin (MEVACOR) 40 MG tablet TAKE 2 TABLETS AT BEDTIME 180 tablet 3   Magnesium 500 MG TABS Take 500 mg by mouth daily.     metFORMIN (GLUCOPHAGE-XR) 500 MG 24 hr tablet Take 2 tablets (1,000 mg total) by mouth daily with breakfast. 180 tablet 1   nystatin (MYCOSTATIN) 100000 UNIT/ML suspension  Take 5 mLs (500,000 Units total) by mouth 4 (four) times daily. (Patient not taking: Reported on 12/10/2023) 120 mL 0   No facility-administered medications prior to visit.     Per HPI unless specifically indicated in ROS section below Review of Systems  Objective:  BP (!) 144/72 (BP Location: Right Arm, Cuff Size: Large)   Pulse 72   Temp 97.8 F (36.6 C) (Oral)   Ht 5' 8.5" (1.74 m)   Wt 240 lb 2 oz (108.9 kg)   SpO2 98%   BMI 35.98 kg/m   Wt Readings from Last 3 Encounters:  12/10/23 240 lb 2 oz (108.9 kg)  11/12/23 236 lb (107 kg)  11/03/23 235 lb 12.8 oz (107 kg)      Physical Exam Vitals and nursing note reviewed.  Constitutional:      Appearance: Normal appearance. He is not ill-appearing.  HENT:     Head: Normocephalic and atraumatic.     Mouth/Throat:     Mouth: Mucous membranes are moist.     Pharynx: Oropharynx is clear.  Eyes:     Extraocular Movements: Extraocular movements intact.     Conjunctiva/sclera: Conjunctivae normal.     Pupils: Pupils are equal, round, and reactive to light.  Cardiovascular:     Rate and Rhythm: Normal rate and regular rhythm.     Pulses: Normal pulses.     Heart sounds: Murmur (3/6 systolic) heard.  Pulmonary:     Effort: Pulmonary effort is normal. No respiratory distress.     Breath sounds: Normal breath sounds. No wheezing, rhonchi or rales.  Musculoskeletal:     Cervical back: Normal range of motion and neck supple. No tenderness.     Right lower leg: No edema.     Left lower leg: No edema.     Comments: See HPI for foot exam if done  Lymphadenopathy:     Cervical: No cervical adenopathy.  Skin:    General: Skin is warm and dry.     Findings: No rash.  Neurological:     Mental Status: He is alert.  Psychiatric:        Mood and Affect: Mood normal.        Behavior: Behavior normal.        Assessment & Plan:   Problem List Items Addressed This Visit     Type 2 diabetes mellitus with other specified  complication (HCC) - Primary   Chronic recently deteriorated in setting of budesonide course.  Significant improvement based on fingerstick cbg's at home since starting lantus up to 14u daily as well as metformin XR 1000mg  daily.  Continue current regimen, although discussed that as he comes off budesonide he  should monitor for hypoglycemia and let me know if this develops to taper down on insulin dose or metformin dose (likely metformin given CKD).  Has not yet started CGM Freestyle Libre 3      Relevant Orders   Fructosamine   Vitamin B12   Hypertension   Chronic, adequate on atenolol and losartan.       CKD stage 3 due to type 2 diabetes mellitus (HCC)   Reviewed with patient. Uppate renal panel.       Relevant Orders   Renal function panel   Low serum vitamin B12   Update levels on lower once weekly dosing.       RESOLVED: Oral thrush   This is resolved.      Loss of taste   This started post-COVID 07/2023 - anticipate COVID sequelae. Discussed with patient, anticipate improvement over next few months. Benign exam today.       Hoarseness of voice   Fluctuating, may be improving.  Continue to monitor. Benign exam today - no significant GERD< allergic rhinitis, alcohol or smoking history.         No orders of the defined types were placed in this encounter.   Orders Placed This Encounter  Procedures   Renal function panel   Fructosamine   Vitamin B12    Patient Instructions  Labs today recheck kidney function and fructosamine  Continue monitoring sugar levels Loss of taste likely post- COVID side effect - hopefully continues improving over next several months Let us know if hoarseness worsening to consider ENT evaluation.  Return in 3 months for physical/medicare wellness visit  Follow up plan: Return in about 3 months (around 03/08/2024) for annual exam, prior fasting for blood work, medicare wellness visit.  Eustaquio Boyden, MD

## 2023-12-11 LAB — VITAMIN B12: Vitamin B-12: 747 pg/mL (ref 211–911)

## 2023-12-11 LAB — RENAL FUNCTION PANEL
Albumin: 3.9 g/dL (ref 3.5–5.2)
BUN: 30 mg/dL — ABNORMAL HIGH (ref 6–23)
CO2: 26 meq/L (ref 19–32)
Calcium: 8.8 mg/dL (ref 8.4–10.5)
Chloride: 107 meq/L (ref 96–112)
Creatinine, Ser: 1.79 mg/dL — ABNORMAL HIGH (ref 0.40–1.50)
GFR: 35.83 mL/min — ABNORMAL LOW (ref >60.00)
Glucose, Bld: 119 mg/dL — ABNORMAL HIGH (ref 70–99)
Phosphorus: 2.3 mg/dL (ref 2.3–4.6)
Potassium: 4.3 meq/L (ref 3.5–5.1)
Sodium: 143 meq/L (ref 135–145)

## 2023-12-13 ENCOUNTER — Other Ambulatory Visit: Payer: Self-pay | Admitting: Family Medicine

## 2023-12-13 DIAGNOSIS — M1A29X0 Drug-induced chronic gout, multiple sites, without tophus (tophi): Secondary | ICD-10-CM

## 2023-12-13 LAB — FRUCTOSAMINE: Fructosamine: 226 umol/L (ref 205–285)

## 2023-12-15 NOTE — Telephone Encounter (Signed)
Call pt and schedule a appt for cpe / labs

## 2023-12-15 NOTE — Telephone Encounter (Signed)
E-scribed refill.   Per Dr Reece Agar (see 12/10/23 OV notes), pt needs CPE around 03/08/24.

## 2023-12-16 NOTE — Telephone Encounter (Signed)
 Noted

## 2023-12-18 ENCOUNTER — Encounter: Payer: Self-pay | Admitting: Family Medicine

## 2023-12-25 ENCOUNTER — Other Ambulatory Visit: Payer: Self-pay | Admitting: Family Medicine

## 2023-12-25 ENCOUNTER — Other Ambulatory Visit: Payer: Self-pay

## 2023-12-25 DIAGNOSIS — E1169 Type 2 diabetes mellitus with other specified complication: Secondary | ICD-10-CM

## 2023-12-25 DIAGNOSIS — I10 Essential (primary) hypertension: Secondary | ICD-10-CM

## 2023-12-25 MED ORDER — ACCU-CHEK GUIDE ME W/DEVICE KIT: PACK | 0 refills | Status: AC

## 2023-12-25 MED ORDER — ACCU-CHEK SOFTCLIX LANCETS MISC: 3 refills | Status: DC

## 2023-12-25 MED ORDER — ACCU-CHEK GUIDE TEST VI STRP: ORAL_STRIP | 3 refills | Status: DC

## 2023-12-25 NOTE — Telephone Encounter (Signed)
 Received faxed message from [facility] [address] requesting rx for Accu-Chek Guide Me kit.  E-scribed rx for Accu-Chek Guide Me meter kit, Accu-Chek Guide test strips and Accu-Chek Softclix lancets.

## 2023-12-30 ENCOUNTER — Encounter: Payer: Self-pay | Admitting: Gastroenterology

## 2023-12-30 ENCOUNTER — Ambulatory Visit (INDEPENDENT_AMBULATORY_CARE_PROVIDER_SITE_OTHER): Payer: Medicare HMO | Admitting: Gastroenterology

## 2023-12-30 ENCOUNTER — Other Ambulatory Visit: Payer: Self-pay

## 2023-12-30 VITALS — BP 147/82 | HR 76 | Wt 231.4 lb

## 2023-12-30 DIAGNOSIS — K52832 Lymphocytic colitis: Secondary | ICD-10-CM | POA: Diagnosis not present

## 2023-12-30 DIAGNOSIS — D803 Selective deficiency of immunoglobulin G [IgG] subclasses: Secondary | ICD-10-CM

## 2023-12-30 DIAGNOSIS — D804 Selective deficiency of immunoglobulin M [IgM]: Secondary | ICD-10-CM | POA: Diagnosis not present

## 2023-12-30 MED ORDER — AZATHIOPRINE 50 MG PO TABS: 50.0000 mg | ORAL_TABLET | Freq: Every day | ORAL | 0 refills | Status: DC

## 2023-12-30 MED ORDER — MESALAMINE ER 0.375 G PO CP24: 750.0000 mg | ORAL_CAPSULE | Freq: Two times a day (BID) | ORAL | 1 refills | Status: DC

## 2023-12-30 NOTE — Patient Instructions (Signed)
 Stop consumption of artificial sweeteners such as Stevia, Crystalite, sucralose etc Cut back on consuming red meat which includes beef, steak, pork Trial of probiotics such as low-fat Keefer half a cup daily Will start azathioprine 50 mg daily Recheck LFTs in 2 weeks Check QuantiFERON gold and hepatitis B serologies today Clinic follow-up in 3 months
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
## 2023-12-31 ENCOUNTER — Other Ambulatory Visit: Payer: Self-pay | Admitting: Family Medicine

## 2023-12-31 DIAGNOSIS — A6002 Herpesviral infection of other male genital organs: Secondary | ICD-10-CM

## 2023-12-31 NOTE — Telephone Encounter (Signed)
 Acyclovir Last filled:  10/21/23, #90 Last OV:  12/10/23, 4 wk DM f/u Next OV:  03/17/24, CPE

## 2024-01-01 ENCOUNTER — Telehealth: Payer: Self-pay

## 2024-01-01 DIAGNOSIS — K52832 Lymphocytic colitis: Secondary | ICD-10-CM

## 2024-01-01 NOTE — Telephone Encounter (Signed)
 Recommend to check TPMT activity, order placed Start azathioprine 25 mg daily  RV

## 2024-01-01 NOTE — Telephone Encounter (Signed)
 It will not let me order 25 mg

## 2024-01-01 NOTE — Telephone Encounter (Signed)
 Received a fax from [facility] stating that the Azathioprine 50mg  tab and patient also takes allopurinol. States the combo may increase the azathioprine side effects. They want to confirm you are okay with this or do you want to change to a different medication.

## 2024-01-01 NOTE — Telephone Encounter (Signed)
 ERx

## 2024-01-02 NOTE — Telephone Encounter (Signed)
 Released the labs and called patient and patient states he will go to the lab when he can

## 2024-01-03 LAB — QUANTIFERON-TB GOLD PLUS
QuantiFERON Mitogen Value: >10 [IU]/mL
QuantiFERON Nil Value: 0.07 [IU]/mL
QuantiFERON TB1 Ag Value: 0.05 [IU]/mL
QuantiFERON TB2 Ag Value: 0.05 [IU]/mL
QuantiFERON-TB Gold Plus: NEGATIVE

## 2024-01-03 LAB — HEPATIC FUNCTION PANEL
ALT: 18 IU/L (ref 0–44)
AST: 18 IU/L (ref 0–40)
Albumin: 4 g/dL (ref 3.8–4.8)
Alkaline Phosphatase: 99 IU/L (ref 44–121)
Bilirubin Total: 0.4 mg/dL (ref 0.0–1.2)
Bilirubin, Direct: 0.13 mg/dL (ref 0.00–0.40)
Total Protein: 5.9 g/dL — ABNORMAL LOW (ref 6.0–8.5)

## 2024-01-03 LAB — HEPATITIS B SURFACE ANTIBODY,QUALITATIVE: Hep B Surface Ab, Qual: NONREACTIVE

## 2024-01-03 LAB — HEPATITIS B CORE ANTIBODY, TOTAL: Hep B Core Total Ab: NEGATIVE

## 2024-01-03 LAB — HEPATITIS B SURFACE ANTIGEN: Hepatitis B Surface Ag: NEGATIVE

## 2024-01-05 ENCOUNTER — Telehealth: Payer: Self-pay | Admitting: Gastroenterology

## 2024-01-05 DIAGNOSIS — K52832 Lymphocytic colitis: Secondary | ICD-10-CM | POA: Diagnosis not present

## 2024-01-05 NOTE — Telephone Encounter (Signed)
 Patient called back and states he went for his blood work today at [facility]

## 2024-01-05 NOTE — Telephone Encounter (Signed)
 Called and left a message for call back

## 2024-01-05 NOTE — Telephone Encounter (Signed)
 Pt requesting call back has question in ref to lab work

## 2024-01-06 ENCOUNTER — Telehealth: Payer: Self-pay

## 2024-01-06 LAB — HEPATIC FUNCTION PANEL
ALT: 14 IU/L (ref 0–44)
AST: 16 IU/L (ref 0–40)
Albumin: 4 g/dL (ref 3.8–4.8)
Alkaline Phosphatase: 95 IU/L (ref 44–121)
Bilirubin Total: 0.4 mg/dL (ref 0.0–1.2)
Bilirubin, Direct: 0.14 mg/dL (ref 0.00–0.40)
Total Protein: 6.2 g/dL (ref 6.0–8.5)

## 2024-01-06 NOTE — Telephone Encounter (Signed)
-----   Message from [facility] sent at 01/05/2024 11:38 PM EDT ----- All his blood test results came back normal.  Waiting on the TPMT levels before we start azathioprine, please inform the patient  RV

## 2024-01-06 NOTE — Telephone Encounter (Signed)
 Patient verbalized understanding of results. Patient states he will go back to the lab to have that lab done

## 2024-01-06 NOTE — Telephone Encounter (Signed)
-----   Message from [facility] sent at 01/05/2024 11:39 PM EDT ----- All the blood test results came back normal.  Waiting on the TPMT levels before starting azathioprine.  Please inform the patient  RV

## 2024-01-06 NOTE — Telephone Encounter (Signed)
 Lori from one of the [facility] drawing stations called and states that she did the TPMT lab test yesterday and why are we sending him back for repeat labs. Informed her the patient told us that he only had one lab drawn because they were confused which test to draw so he said they only drew one test. We received that the Hepatic function panel was done yesterday and received that lab back. The lady was very rude and states you should know the test will take 7 to 12 days to get the test back. Informed her usually it will say pending the next day in epic once it has been drawn she like I do not know. She then asked if she needed to re draw him. Informed her no if she drew the test yesterday again she said I did.

## 2024-01-10 LAB — THIOPURINE METHYLTRANSFERASE (TPMT), RBC: TPMT Activity:: 28.6 U/mL{RBCs}

## 2024-01-12 ENCOUNTER — Telehealth: Payer: Self-pay

## 2024-01-12 NOTE — Telephone Encounter (Signed)
 Called [facility] and informed them that it is okay to fill and we will be monitoring the patient blood work closely. We drew his labs and they were normal also. She said she had to get me transfer over to pharmacist name Jess and informed him this information. Patient verbalized understanding of results. He will let us know when he starts taking the medication and then we will do labs one week from then

## 2024-01-12 NOTE — Telephone Encounter (Signed)
-----   Message from [facility] sent at 01/12/2024 12:54 PM EDT ----- TPMT showed normal activity.  He can go ahead and start azathioprine 50 mg daily and definitely recheck CBC and LFTs in 1 week.  Azathioprine prescription for 30 days only, 0 refills Because of allopurinol, he will need close monitoring with labs  RV

## 2024-01-19 ENCOUNTER — Telehealth: Payer: Self-pay

## 2024-01-19 DIAGNOSIS — K52832 Lymphocytic colitis: Secondary | ICD-10-CM

## 2024-01-19 NOTE — Telephone Encounter (Signed)
 Patient states the Mesalamine is working well and he is not having diarrhea any more. He states he has not receive the azathioprine yet.

## 2024-01-19 NOTE — Telephone Encounter (Signed)
-----   Message from [facility] sent at 01/12/2024  1:26 PM EDT -----  recheck CBC and LFTs in 1 week. Check to see when patient started the medication

## 2024-01-27 NOTE — Telephone Encounter (Signed)
 Patient states the medication came in the mail on 01/20/2024 and he began taking the medication that day. 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

## 2024-02-03 DIAGNOSIS — K52832 Lymphocytic colitis: Secondary | ICD-10-CM | POA: Diagnosis not present

## 2024-02-04 LAB — CBC WITH DIFFERENTIAL/PLATELET
Basophils Absolute: 0.1 10*3/uL (ref 0.0–0.2)
Basos: 1 %
EOS (ABSOLUTE): 0.3 10*3/uL (ref 0.0–0.4)
Eos: 5 %
Hematocrit: 41.7 % (ref 37.5–51.0)
Hemoglobin: 14 g/dL (ref 13.0–17.7)
Immature Grans (Abs): 0 10*3/uL (ref 0.0–0.1)
Immature Granulocytes: 0 %
Lymphocytes Absolute: 2.3 10*3/uL (ref 0.7–3.1)
Lymphs: 33 %
MCH: 33.7 pg — ABNORMAL HIGH (ref 26.6–33.0)
MCHC: 33.6 g/dL (ref 31.5–35.7)
MCV: 101 fL — ABNORMAL HIGH (ref 79–97)
Monocytes Absolute: 0.3 10*3/uL (ref 0.1–0.9)
Monocytes: 4 %
Neutrophils Absolute: 3.9 10*3/uL (ref 1.4–7.0)
Neutrophils: 57 %
Platelets: 234 10*3/uL (ref 150–450)
RBC: 4.15 x10E6/uL (ref 4.14–5.80)
RDW: 13.6 % (ref 11.6–15.4)
WBC: 6.9 10*3/uL (ref 3.4–10.8)

## 2024-02-04 LAB — HEPATIC FUNCTION PANEL
ALT: 20 IU/L (ref 0–44)
AST: 18 IU/L (ref 0–40)
Albumin: 4.1 g/dL (ref 3.8–4.8)
Alkaline Phosphatase: 137 IU/L — ABNORMAL HIGH (ref 44–121)
Bilirubin Total: 0.4 mg/dL (ref 0.0–1.2)
Bilirubin, Direct: 0.19 mg/dL (ref 0.00–0.40)
Total Protein: 6.1 g/dL (ref 6.0–8.5)

## 2024-02-05 ENCOUNTER — Telehealth: Payer: Self-pay

## 2024-02-05 NOTE — Telephone Encounter (Signed)
 Patient verbalized understanding of results and instructions

## 2024-02-05 NOTE — Telephone Encounter (Signed)
-----   Message from [facility] sent at 02/05/2024  8:08 AM EDT ----- Since mesalamine is working well without having diarrhea, let's hold off continuing azathioprine.  Since it has been 2 weeks taking azathioprine, go ahead and get the blood work done.  However, he should let us know if his diarrhea recurs after stopping azathioprine, while on mesalamine  RV

## 2024-02-06 ENCOUNTER — Telehealth: Payer: Self-pay | Admitting: Gastroenterology

## 2024-02-06 DIAGNOSIS — K52832 Lymphocytic colitis: Secondary | ICD-10-CM

## 2024-02-06 NOTE — Telephone Encounter (Signed)
 The patient called to request a medication refill for Mesalamine 0.375 g (24 HR). [facility] uses [facility] located at [address], [place], [place] [address].

## 2024-02-06 NOTE — Telephone Encounter (Signed)
 The patient called in wanting to know about his medication.

## 2024-02-11 ENCOUNTER — Other Ambulatory Visit: Payer: Self-pay | Admitting: Family Medicine

## 2024-02-11 ENCOUNTER — Telehealth: Payer: Self-pay | Admitting: Family Medicine

## 2024-02-11 DIAGNOSIS — I10 Essential (primary) hypertension: Secondary | ICD-10-CM

## 2024-02-11 DIAGNOSIS — M1A29X0 Drug-induced chronic gout, multiple sites, without tophus (tophi): Secondary | ICD-10-CM

## 2024-02-11 NOTE — Telephone Encounter (Signed)
 RN called and spoke with patient. Pt stated he is not using the [facility] continuous glucose sensor and that instead he has been using an Accu-check device. Pt asked how many times a day he should check his BG. RN advised pt he should check it once a day per the Accu-check instructions on the med list. Pt verbalized understanding. Pt reports still taking 10 units of Lantus daily.  Pt states lately his BG has been in the 90s and states it is well-controlled. Pt had been taking budesonide for diarrhea and states he is no longer taking that. RN advised pt to monitor for hypoglycemia and to call us  if his BG is in the 70s, pt verbalized understanding. Pt asked RN if it is possible that he could come off insulin since is sugars are well-controlled. RN advised pt he can discuss that with the provider at his visit next month.  Copied from CRM [id number]. Topic: Clinical - Medical Advice >> Feb 11, 2024  2:59 PM Rosamond Comes wrote: Reason for CRM: patient calling in asking how many time to test his blood sugar and insulin questions  Patient would like to talk to a nurse about this.  Patient phone # [phone] ok to leave detailed message.

## 2024-02-12 MED ORDER — MESALAMINE ER 0.375 G PO CP24: 750.0000 mg | ORAL_CAPSULE | Freq: Two times a day (BID) | ORAL | 0 refills | Status: DC

## 2024-02-12 NOTE — Telephone Encounter (Signed)
 Sent medication to the pharmacy

## 2024-02-12 NOTE — Telephone Encounter (Signed)
 Allopurinol Last filled:  12/23/23, #180 Last OV:  12/10/23, 4 wk DM f/u Next OV:  03/17/24, CPE

## 2024-02-12 NOTE — Telephone Encounter (Signed)
 Noted agree with recommendations.  If he'd like, he could drop lantus dose to 5u or drop metformin XR to 500mg  once daily and monitor sugar control over the next month prior to next month's visit.

## 2024-02-12 NOTE — Addendum Note (Signed)
 Addended by: Conny Del L on: 02/12/2024 11:25 AM   Modules accepted: Orders

## 2024-02-12 NOTE — Telephone Encounter (Signed)
 Spoke with pt relaying Dr Ocie Belt message. Pt verbalizes understanding and chooses to try decreasing Lantus to 5 U daily first to see how that does. He will check BS once daily over the next month and will let Dr Crissie Dome know how readings are doing.

## 2024-02-12 NOTE — Telephone Encounter (Signed)
 ERx

## 2024-02-16 ENCOUNTER — Other Ambulatory Visit: Payer: Self-pay | Admitting: Family Medicine

## 2024-02-16 ENCOUNTER — Other Ambulatory Visit: Payer: Self-pay

## 2024-02-16 DIAGNOSIS — L538 Other specified erythematous conditions: Secondary | ICD-10-CM | POA: Diagnosis not present

## 2024-02-16 DIAGNOSIS — L821 Other seborrheic keratosis: Secondary | ICD-10-CM | POA: Diagnosis not present

## 2024-02-16 DIAGNOSIS — Z85828 Personal history of other malignant neoplasm of skin: Secondary | ICD-10-CM | POA: Diagnosis not present

## 2024-02-16 DIAGNOSIS — E1169 Type 2 diabetes mellitus with other specified complication: Secondary | ICD-10-CM

## 2024-02-16 DIAGNOSIS — Z86007 Personal history of in-situ neoplasm of skin: Secondary | ICD-10-CM | POA: Diagnosis not present

## 2024-02-16 DIAGNOSIS — L814 Other melanin hyperpigmentation: Secondary | ICD-10-CM | POA: Diagnosis not present

## 2024-02-16 DIAGNOSIS — Z08 Encounter for follow-up examination after completed treatment for malignant neoplasm: Secondary | ICD-10-CM | POA: Diagnosis not present

## 2024-02-16 DIAGNOSIS — D225 Melanocytic nevi of trunk: Secondary | ICD-10-CM | POA: Diagnosis not present

## 2024-02-16 DIAGNOSIS — D492 Neoplasm of unspecified behavior of bone, soft tissue, and skin: Secondary | ICD-10-CM | POA: Diagnosis not present

## 2024-02-16 DIAGNOSIS — L570 Actinic keratosis: Secondary | ICD-10-CM | POA: Diagnosis not present

## 2024-02-16 NOTE — Telephone Encounter (Signed)
 Seen where adjustment was made. Wanted to make sure ok to refill.

## 2024-02-18 MED ORDER — LANTUS SOLOSTAR 100 UNIT/ML ~~LOC~~ SOPN: 5.0000 [IU] | PEN_INJECTOR | Freq: Every day | SUBCUTANEOUS | 2 refills | Status: DC

## 2024-02-18 NOTE — Telephone Encounter (Signed)
 ERx

## 2024-02-26 ENCOUNTER — Telehealth: Payer: Self-pay

## 2024-02-26 DIAGNOSIS — K52832 Lymphocytic colitis: Secondary | ICD-10-CM

## 2024-02-26 MED ORDER — MESALAMINE ER 0.375 G PO CP24: 750.0000 mg | ORAL_CAPSULE | Freq: Two times a day (BID) | ORAL | 0 refills | Status: DC

## 2024-02-26 NOTE — Telephone Encounter (Signed)
 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

## 2024-03-03 ENCOUNTER — Other Ambulatory Visit: Payer: Self-pay | Admitting: Family Medicine

## 2024-03-03 DIAGNOSIS — I10 Essential (primary) hypertension: Secondary | ICD-10-CM

## 2024-03-09 DIAGNOSIS — Z0100 Encounter for examination of eyes and vision without abnormal findings: Secondary | ICD-10-CM | POA: Diagnosis not present

## 2024-03-09 DIAGNOSIS — E119 Type 2 diabetes mellitus without complications: Secondary | ICD-10-CM | POA: Diagnosis not present

## 2024-03-09 DIAGNOSIS — H2513 Age-related nuclear cataract, bilateral: Secondary | ICD-10-CM | POA: Diagnosis not present

## 2024-03-09 DIAGNOSIS — B0052 Herpesviral keratitis: Secondary | ICD-10-CM | POA: Diagnosis not present

## 2024-03-09 LAB — HM DIABETES EYE EXAM

## 2024-03-10 ENCOUNTER — Other Ambulatory Visit (INDEPENDENT_AMBULATORY_CARE_PROVIDER_SITE_OTHER): Payer: Medicare HMO

## 2024-03-10 ENCOUNTER — Other Ambulatory Visit: Payer: Self-pay | Admitting: Family Medicine

## 2024-03-10 DIAGNOSIS — E559 Vitamin D deficiency, unspecified: Secondary | ICD-10-CM

## 2024-03-10 DIAGNOSIS — E538 Deficiency of other specified B group vitamins: Secondary | ICD-10-CM

## 2024-03-10 DIAGNOSIS — Z794 Long term (current) use of insulin: Secondary | ICD-10-CM | POA: Diagnosis not present

## 2024-03-10 DIAGNOSIS — N183 Chronic kidney disease, stage 3 unspecified: Secondary | ICD-10-CM

## 2024-03-10 DIAGNOSIS — E785 Hyperlipidemia, unspecified: Secondary | ICD-10-CM

## 2024-03-10 DIAGNOSIS — M1A29X0 Drug-induced chronic gout, multiple sites, without tophus (tophi): Secondary | ICD-10-CM

## 2024-03-10 DIAGNOSIS — E1122 Type 2 diabetes mellitus with diabetic chronic kidney disease: Secondary | ICD-10-CM | POA: Diagnosis not present

## 2024-03-10 DIAGNOSIS — E1169 Type 2 diabetes mellitus with other specified complication: Secondary | ICD-10-CM

## 2024-03-10 DIAGNOSIS — D809 Immunodeficiency with predominantly antibody defects, unspecified: Secondary | ICD-10-CM

## 2024-03-10 DIAGNOSIS — N400 Enlarged prostate without lower urinary tract symptoms: Secondary | ICD-10-CM

## 2024-03-11 LAB — COMPREHENSIVE METABOLIC PANEL WITH GFR
ALT: 17 U/L (ref 0–53)
AST: 21 U/L (ref 0–37)
Albumin: 4.3 g/dL (ref 3.5–5.2)
Alkaline Phosphatase: 80 U/L (ref 39–117)
BUN: 22 mg/dL (ref 6–23)
CO2: 26 meq/L (ref 19–32)
Calcium: 9 mg/dL (ref 8.4–10.5)
Chloride: 106 meq/L (ref 96–112)
Creatinine, Ser: 1.33 mg/dL (ref 0.40–1.50)
GFR: 51.09 mL/min — ABNORMAL LOW (ref >60.00)
Glucose, Bld: 83 mg/dL (ref 70–99)
Potassium: 4.2 meq/L (ref 3.5–5.1)
Sodium: 141 meq/L (ref 135–145)
Total Bilirubin: 0.4 mg/dL (ref 0.2–1.2)
Total Protein: 6.3 g/dL (ref 6.0–8.3)

## 2024-03-11 LAB — MICROALBUMIN / CREATININE URINE RATIO
Creatinine,U: 137.5 mg/dL
Microalb Creat Ratio: 82.4 mg/g — ABNORMAL HIGH (ref 0.0–30.0)
Microalb, Ur: 11.3 mg/dL — ABNORMAL HIGH (ref 0.0–1.9)

## 2024-03-11 LAB — CBC WITH DIFFERENTIAL/PLATELET
Basophils Absolute: 0 10*3/uL (ref 0.0–0.1)
Basophils Relative: 0.6 % (ref 0.0–3.0)
Eosinophils Absolute: 0.2 10*3/uL (ref 0.0–0.7)
Eosinophils Relative: 2.9 % (ref 0.0–5.0)
HCT: 41.3 % (ref 39.0–52.0)
Hemoglobin: 14 g/dL (ref 13.0–17.0)
Lymphocytes Relative: 25.4 % (ref 12.0–46.0)
Lymphs Abs: 1.8 10*3/uL (ref 0.7–4.0)
MCHC: 33.8 g/dL (ref 30.0–36.0)
MCV: 97.1 fl (ref 78.0–100.0)
Monocytes Absolute: 0.7 10*3/uL (ref 0.1–1.0)
Monocytes Relative: 10.1 % (ref 3.0–12.0)
Neutro Abs: 4.4 10*3/uL (ref 1.4–7.7)
Neutrophils Relative %: 61 % (ref 43.0–77.0)
Platelets: 158 10*3/uL (ref 150.0–400.0)
RBC: 4.26 Mil/uL (ref 4.22–5.81)
RDW: 15.8 % — ABNORMAL HIGH (ref 11.5–15.5)
WBC: 7.2 10*3/uL (ref 4.0–10.5)

## 2024-03-11 LAB — VITAMIN D 25 HYDROXY (VIT D DEFICIENCY, FRACTURES): VITD: 44.98 ng/mL (ref 30.00–100.00)

## 2024-03-11 LAB — PHOSPHORUS: Phosphorus: 2.4 mg/dL (ref 2.3–4.6)

## 2024-03-11 LAB — HEMOGLOBIN A1C: Hgb A1c MFr Bld: 6 % (ref 4.6–6.5)

## 2024-03-11 LAB — LIPID PANEL
Cholesterol: 137 mg/dL (ref 0–200)
HDL: 36.1 mg/dL — ABNORMAL LOW (ref >39.00)
LDL Cholesterol: 61 mg/dL (ref 0–99)
NonHDL: 101.05
Total CHOL/HDL Ratio: 4
Triglycerides: 201 mg/dL — ABNORMAL HIGH (ref 0.0–149.0)
VLDL: 40.2 mg/dL — ABNORMAL HIGH (ref 0.0–40.0)

## 2024-03-11 LAB — URIC ACID: Uric Acid, Serum: 5.3 mg/dL (ref 4.0–7.8)

## 2024-03-11 LAB — VITAMIN B12: Vitamin B-12: 516 pg/mL (ref 211–911)

## 2024-03-11 LAB — PARATHYROID HORMONE, INTACT (NO CA): PTH: 32 pg/mL (ref 16–77)

## 2024-03-11 LAB — PSA: PSA: 0.16 ng/mL (ref 0.10–4.00)

## 2024-03-12 ENCOUNTER — Telehealth: Payer: Self-pay

## 2024-03-12 DIAGNOSIS — E1169 Type 2 diabetes mellitus with other specified complication: Secondary | ICD-10-CM

## 2024-03-12 MED ORDER — ACCU-CHEK GUIDE TEST VI STRP: ORAL_STRIP | 2 refills | Status: AC

## 2024-03-12 NOTE — Telephone Encounter (Signed)
 E-scribed refill to [facility] order.

## 2024-03-15 ENCOUNTER — Other Ambulatory Visit: Payer: Self-pay

## 2024-03-15 ENCOUNTER — Other Ambulatory Visit: Payer: Self-pay | Admitting: Family Medicine

## 2024-03-15 DIAGNOSIS — A6002 Herpesviral infection of other male genital organs: Secondary | ICD-10-CM

## 2024-03-15 DIAGNOSIS — E1169 Type 2 diabetes mellitus with other specified complication: Secondary | ICD-10-CM

## 2024-03-15 NOTE — Telephone Encounter (Signed)
 Acyclovir Last filled:  10/21/23, #90 Last OV:  12/10/23, 4 wk DM f/u Next OV:  03/17/24, CPE

## 2024-03-15 NOTE — Telephone Encounter (Signed)
 Lantus  Last rx:  02/18/24, #6 mL Last OV:  12/10/23, 4 wk DM f/u Next OV:  03/17/24, CPE

## 2024-03-16 ENCOUNTER — Other Ambulatory Visit: Payer: Self-pay | Admitting: Family Medicine

## 2024-03-16 ENCOUNTER — Ambulatory Visit: Payer: Self-pay | Admitting: Family Medicine

## 2024-03-16 DIAGNOSIS — A6002 Herpesviral infection of other male genital organs: Secondary | ICD-10-CM

## 2024-03-16 DIAGNOSIS — E1169 Type 2 diabetes mellitus with other specified complication: Secondary | ICD-10-CM

## 2024-03-16 NOTE — Telephone Encounter (Signed)
 ERx

## 2024-03-16 NOTE — Telephone Encounter (Signed)
 Copied from CRM [id number]. Topic: Clinical - Medication Refill >> Mar 16, 2024  4:40 PM Melissa C wrote: Medication: acyclovir  (ZOVIRAX ) 400 MG tablet insulin  glargine (LANTUS  SOLOSTAR) 100 UNIT/ML Solostar Pen Blood Glucose Monitoring Suppl (ACCU-CHEK GUIDE ME) w/Device KIT   Has the patient contacted their pharmacy? Yes, pharmacy agent Bianca from [facility] contacted us  directly  (Agent: If no, request that the patient contact the pharmacy for the refill. If patient does not wish to contact the pharmacy document the reason why and proceed with request.) (Agent: If yes, when and what did the pharmacy advise?)  This is the patient's preferred pharmacy:   [facility] - [place], [place] - [address] [place] [address] Phone: [phone] Fax: [phone]  Is this the correct pharmacy for this prescription? Yes If no, delete pharmacy and type the correct one.   Has the prescription been filled recently? No  Is the patient out of the medication? No- not out of first medication, but in need of prescriptions for the two new diabetic supplies   Has the patient been seen for an appointment in the last year OR does the patient have an upcoming appointment? Yes  Can we respond through MyChart? Yes  Agent: Please be advised that Rx refills may take up to 3 business days. We ask that you follow-up with your pharmacy.

## 2024-03-17 ENCOUNTER — Encounter: Payer: Self-pay | Admitting: Family Medicine

## 2024-03-17 ENCOUNTER — Ambulatory Visit (INDEPENDENT_AMBULATORY_CARE_PROVIDER_SITE_OTHER): Payer: Medicare HMO | Admitting: Family Medicine

## 2024-03-17 VITALS — BP 132/78 | HR 100 | Temp 98.2°F | Ht 67.5 in | Wt 229.2 lb

## 2024-03-17 DIAGNOSIS — Z0000 Encounter for general adult medical examination without abnormal findings: Secondary | ICD-10-CM | POA: Insufficient documentation

## 2024-03-17 DIAGNOSIS — A6000 Herpesviral infection of urogenital system, unspecified: Secondary | ICD-10-CM

## 2024-03-17 DIAGNOSIS — D809 Immunodeficiency with predominantly antibody defects, unspecified: Secondary | ICD-10-CM

## 2024-03-17 DIAGNOSIS — M1A29X0 Drug-induced chronic gout, multiple sites, without tophus (tophi): Secondary | ICD-10-CM | POA: Diagnosis not present

## 2024-03-17 DIAGNOSIS — I350 Nonrheumatic aortic (valve) stenosis: Secondary | ICD-10-CM

## 2024-03-17 DIAGNOSIS — Z23 Encounter for immunization: Secondary | ICD-10-CM | POA: Diagnosis not present

## 2024-03-17 DIAGNOSIS — K52832 Lymphocytic colitis: Secondary | ICD-10-CM

## 2024-03-17 DIAGNOSIS — Z7189 Other specified counseling: Secondary | ICD-10-CM

## 2024-03-17 DIAGNOSIS — E785 Hyperlipidemia, unspecified: Secondary | ICD-10-CM

## 2024-03-17 DIAGNOSIS — Z794 Long term (current) use of insulin: Secondary | ICD-10-CM

## 2024-03-17 DIAGNOSIS — E1122 Type 2 diabetes mellitus with diabetic chronic kidney disease: Secondary | ICD-10-CM | POA: Diagnosis not present

## 2024-03-17 DIAGNOSIS — I10 Essential (primary) hypertension: Secondary | ICD-10-CM | POA: Diagnosis not present

## 2024-03-17 DIAGNOSIS — E538 Deficiency of other specified B group vitamins: Secondary | ICD-10-CM

## 2024-03-17 DIAGNOSIS — N183 Chronic kidney disease, stage 3 unspecified: Secondary | ICD-10-CM

## 2024-03-17 DIAGNOSIS — R809 Proteinuria, unspecified: Secondary | ICD-10-CM

## 2024-03-17 DIAGNOSIS — E559 Vitamin D deficiency, unspecified: Secondary | ICD-10-CM

## 2024-03-17 DIAGNOSIS — E1169 Type 2 diabetes mellitus with other specified complication: Secondary | ICD-10-CM | POA: Diagnosis not present

## 2024-03-17 DIAGNOSIS — N400 Enlarged prostate without lower urinary tract symptoms: Secondary | ICD-10-CM

## 2024-03-17 DIAGNOSIS — R0609 Other forms of dyspnea: Secondary | ICD-10-CM

## 2024-03-17 DIAGNOSIS — E1129 Type 2 diabetes mellitus with other diabetic kidney complication: Secondary | ICD-10-CM

## 2024-03-17 DIAGNOSIS — G4733 Obstructive sleep apnea (adult) (pediatric): Secondary | ICD-10-CM

## 2024-03-17 DIAGNOSIS — G9389 Other specified disorders of brain: Secondary | ICD-10-CM

## 2024-03-17 DIAGNOSIS — I2510 Atherosclerotic heart disease of native coronary artery without angina pectoris: Secondary | ICD-10-CM

## 2024-03-17 MED ORDER — METFORMIN HCL ER 500 MG PO TB24: 1000.0000 mg | ORAL_TABLET | Freq: Every day | ORAL | 4 refills | Status: DC

## 2024-03-17 MED ORDER — LOSARTAN POTASSIUM 50 MG PO TABS
50.0000 mg | ORAL_TABLET | Freq: Every day | ORAL | 4 refills | Status: DC
Start: 2024-03-17 — End: 2024-08-19

## 2024-03-17 MED ORDER — TAMSULOSIN HCL 0.4 MG PO CAPS
0.4000 mg | ORAL_CAPSULE | Freq: Every day | ORAL | 4 refills | Status: AC
Start: 2024-03-17 — End: ?

## 2024-03-17 MED ORDER — LOVASTATIN 40 MG PO TABS: 80.0000 mg | ORAL_TABLET | Freq: Every day | ORAL | 4 refills | Status: DC

## 2024-03-17 MED ORDER — ATENOLOL 25 MG PO TABS: 25.0000 mg | ORAL_TABLET | Freq: Every day | ORAL | 4 refills | Status: AC

## 2024-03-17 MED ORDER — ALLOPURINOL 100 MG PO TABS: 100.0000 mg | ORAL_TABLET | Freq: Two times a day (BID) | ORAL | 4 refills | Status: DC

## 2024-03-17 MED ORDER — LANTUS SOLOSTAR 100 UNIT/ML ~~LOC~~ SOPN
10.0000 [IU] | PEN_INJECTOR | Freq: Every day | SUBCUTANEOUS | 2 refills | Status: DC
Start: 2024-03-17 — End: 2024-05-10

## 2024-03-17 NOTE — Assessment & Plan Note (Signed)
 Chronic, stable period on daily allopurinol  200mg .

## 2024-03-17 NOTE — Assessment & Plan Note (Signed)
 Preventative protocols reviewed and updated unless pt declined. Discussed healthy diet and lifestyle.

## 2024-03-17 NOTE — Progress Notes (Signed)
 Ph: [phone] Fax: [phone]   Patient ID: Thomas Carlson, male    DOB: [birth year], 78 y.o.   MRN: [id number]  This visit was conducted in person.  BP 132/78   Pulse 100   Temp 98.2 F (36.8 C) (Oral)   Ht 5' 7.5" (1.715 m)   Wt 229 lb 4 oz (104 kg)   SpO2 93%   BMI 35.38 kg/m    CC: AMW/CPE Subjective:   HPI: Thomas Carlson is a 78 y.o. male presenting on 03/17/2024 for Medicare Wellness   Did not see health advisor.   Hearing Screening   500Hz  1000Hz  2000Hz  4000Hz   Right ear 25 25 20 20   Left ear 25 40 20 20  Vision Screening - Comments:: Last eye exam, 02/2024.  Flowsheet Row Office Visit from 03/17/2024 in [facility] [place]  PHQ-2 Total Score 0          03/17/2024    3:06 PM 11/12/2023    3:18 PM 09/04/2022    8:26 AM 07/04/2021   11:01 AM 03/28/2020    2:25 PM  Fall Risk   Falls in the past year? 0 0 0 0 0  Number falls in past yr:   0 0   Injury with Fall?   0    Risk for fall due to :   No Fall Risks    Follow up   Falls prevention discussed;Falls evaluation completed     H/o brain surgery craniotomy age 79yo after baseball hit in head with resultant brain hematoma.    On chronic acyclovir  400mg  daily for h/o herpes - flares when stopped.   H/o lymphocytic colitis refractory to Entocort s/p Enteragram treatment by [facility] 2021, also has h/o int hem s/p hemorrhoid ligation x3. Most recently saw Dr Baldomero Bone 12/2023 - started on azathioprine  50mg  daily + apriso  - now just on Apriso  2 capsules BID - overall stable period. Rec artificial sweetener avoidance, limiting red meat.   Mild immunoglobulin deficiency - ?CVID. Saw [facility].   DM - acute deterioration after chronic budesonide  use. Lantus  insulin  was started with subsequent improvement in glycemic control once budesonide  stopped. Was able to taper off lantus . Continues metformin  XR 1000mg  daily. Doesn't plan to restart budesonide .   Notes ongoing exertional dyspnea for  the past year along with mild hypoxia on initial presentation (93% on RA). No significant pedal edema. Denies syncope, chest pain, dizziness or palpitations.   Preventative: COLONOSCOPY WITH PROPOFOL  11/10/2015; diverticulosis, biopsy showed microscopic colitis Marnee Sink, MD)  COLONOSCOPY 08/26/2018 - TA, rpt 3 yrs (Vanga, Elson Halon)  COLONOSCOPY WITH PROPOFOL  09/26/2021 - diverticulosis, rpt 5 yrs (Vanga, Elson Halon, MD) Prostate - yearly. Nocturia x2. No stream weakening Lung cancer screening - not eligible  Flu shot yearly  COVID vaccine - Pfizer 10/2019, 11/2019, booster x2 08/2020,  07/2022 Pneumovax 06/2012, prevnar-13 07/2016, prevnar-20 today Td - 2012  zostavax 2011  shingrix - 12/2022, due for 2nd Advanced directive - continues working on living will at home. HCPOA is wife. Asked to bring us  copy when complete  Seat belt use discuss  Sunscreen use discussed, no changing moles on skin. Sees derm yearly.  Sleep - averages 5-6 hours of sleep Non smoker Alcohol - none  Dentist - hasn't seen recently Eye exam yearly  Bowel - no constipation, ongoing chronic loose stools  Bladder - no incontinence   Caffeine: 2 cans of diet soda/day   Lives with wife. 1  dog at home. 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 Interim medical history since our last visit reviewed. Allergies and medications reviewed and updated. Outpatient Medications Prior to Visit  Medication Sig Dispense Refill   Accu-Chek Softclix Lancets lancets Use as instructed to check blood sugar once a day 100 each 3   acyclovir  (ZOVIRAX ) 400 MG tablet TAKE 1 TABLET EVERY DAY 90 tablet 3   aspirin  EC 81  MG tablet Take 1 tablet (81 mg total) by mouth daily.     Biotin  1000 MCG tablet Take 1,000 mcg by mouth daily.      Blood Glucose Monitoring Suppl (ACCU-CHEK GUIDE ME) w/Device KIT Use as instructed to check blood sugar once a day 1 kit 0   Cholecalciferol  (VITAMIN D3) 25 MCG (1000 UT) CAPS Take 1 capsule (1,000 Units total) by mouth daily. 30 capsule    Cyanocobalamin  (B-12) 1000 MCG SUBL Place 1 tablet under the tongue once a week.     glucose blood (ACCU-CHEK GUIDE TEST) test strip Use as instructed to check blood sugar once a day 100 each 2   Insulin  Pen Needle (PEN NEEDLES) 31G X 5 MM MISC Use as directed to inject insulin  daily 300 each 3   Magnesium  500 MG TABS Take 500 mg by mouth daily.     mesalamine  (APRISO ) 0.375 g 24 hr capsule Take 2 capsules (0.75 g total) by mouth 2 (two) times daily. 360 capsule 0   allopurinol  (ZYLOPRIM ) 100 MG tablet TAKE 2 TABLETS EVERY DAY (NEED MD APPOINTMENT FOR REFILLS) 180 tablet 0   atenolol  (TENORMIN ) 25 MG tablet TAKE 1 TABLET EVERY DAY 90 tablet 0   insulin  glargine (LANTUS  SOLOSTAR) 100 UNIT/ML Solostar Pen Inject 5 Units into the skin daily. 6 mL 2   losartan  (COZAAR ) 50 MG tablet TAKE 1 TABLET EVERY DAY 90 tablet 0   lovastatin  (MEVACOR ) 40 MG tablet TAKE 2 TABLETS AT BEDTIME 180 tablet 3   metFORMIN  (GLUCOPHAGE -XR) 500 MG 24 hr tablet Take 2 tablets (1,000 mg total) by mouth daily with breakfast. 180 tablet 1   Continuous Glucose Sensor (FREESTYLE LIBRE 3 SENSOR) MISC Place 1 sensor on the skin every 14 days. Use to check glucose continuously E11.69, Z79.4 (Patient not taking: Reported on 03/17/2024) 2 each 3   nystatin  (MYCOSTATIN ) 100000 UNIT/ML suspension Take 5 mLs (500,000 Units total) by mouth 4 (four) times daily. (Patient not taking: Reported on 12/30/2023) 120 mL 0   atenolol  (TENORMIN ) 50 MG tablet Take 50 mg by mouth daily. (Patient not taking: Reported on 03/17/2024)     budesonide  (ENTOCORT EC ) 3 MG 24 hr capsule Take 1 capsule (3 mg  total) by mouth 3 (three) times daily. (Patient not taking: Reported on 12/30/2023) 270 capsule 0   Insulin  Pen Needle 32G X 5 MM MISC E11.69. Use as directed to inject insulin  daily 50 each 0   No facility-administered medications prior to visit.     Per HPI unless specifically indicated in ROS section below Review of Systems  Constitutional:  Negative for activity change, appetite change, chills, fatigue, fever and unexpected weight change.  HENT:  Negative for hearing loss.   Eyes:  Negative for visual  disturbance.  Respiratory:  Positive for shortness of breath (exertional). Negative for cough, chest tightness and wheezing.   Cardiovascular:  Negative for chest pain, palpitations and leg swelling.  Gastrointestinal:  Positive for diarrhea (loose stools). Negative for abdominal distention, abdominal pain, blood in stool, constipation, nausea and vomiting.  Genitourinary:  Negative for difficulty urinating and hematuria.  Musculoskeletal:  Negative for arthralgias, myalgias and neck pain.  Skin:  Negative for rash.  Neurological:  Negative for dizziness, seizures and syncope.  Hematological:  Negative for adenopathy. Bruises/bleeds easily.  Psychiatric/Behavioral:  Negative for dysphoric mood. The patient is not nervous/anxious.     Objective:  BP 132/78   Pulse 100   Temp 98.2 F (36.8 C) (Oral)   Ht 5' 7.5" (1.715 m)   Wt 229 lb 4 oz (104 kg)   SpO2 93%   BMI 35.38 kg/m   Wt Readings from Last 3 Encounters:  03/17/24 229 lb 4 oz (104 kg)  12/30/23 231 lb 6.4 oz (105 kg)  12/10/23 240 lb 2 oz (108.9 kg)      Physical Exam Vitals and nursing note reviewed.  Constitutional:      General: He is not in acute distress.    Appearance: Normal appearance. He is well-developed. He is not ill-appearing.  HENT:     Head: Normocephalic and atraumatic.     Right Ear: Hearing, tympanic membrane, ear canal and external ear normal.     Left Ear: Hearing, tympanic membrane, ear canal  and external ear normal.     Mouth/Throat:     Mouth: Mucous membranes are moist.     Pharynx: Oropharynx is clear. No oropharyngeal exudate or posterior oropharyngeal erythema.  Eyes:     General: No scleral icterus.    Extraocular Movements: Extraocular movements intact.     Conjunctiva/sclera: Conjunctivae normal.     Pupils: Pupils are equal, round, and reactive to light.  Neck:     Thyroid : No thyroid  mass or thyromegaly.     Vascular: No carotid bruit.  Cardiovascular:     Rate and Rhythm: Normal rate and regular rhythm.     Pulses: Normal pulses.          Radial pulses are 2+ on the right side and 2+ on the left side.     Heart sounds: Normal heart sounds. No murmur heard. Pulmonary:     Effort: Pulmonary effort is normal. No respiratory distress.     Breath sounds: Normal breath sounds. No wheezing, rhonchi or rales.  Abdominal:     General: Bowel sounds are normal. There is no distension.     Palpations: Abdomen is soft. There is no mass.     Tenderness: There is no abdominal tenderness. There is no guarding or rebound.     Hernia: No hernia is present.  Musculoskeletal:        General: Normal range of motion.     Cervical back: Normal range of motion and neck supple.     Right lower leg: No edema.     Left lower leg: No edema.  Lymphadenopathy:     Cervical: No cervical adenopathy.  Skin:    General: Skin is warm and dry.     Findings: No rash.  Neurological:     General: No focal deficit present.     Mental Status: He is alert and oriented to person, place, and time.     Comments:  Recall 3/3 Calculation 5/5 DLROW  Psychiatric:  Mood and Affect: Mood normal.        Behavior: Behavior normal.        Thought Content: Thought content normal.        Judgment: Judgment normal.       Results for orders placed or performed in visit on 03/10/24  Vitamin B12   Collection Time: 03/10/24  2:54 PM  Result Value Ref Range   Vitamin B-12 516 211 - 911 pg/mL   PSA   Collection Time: 03/10/24  2:54 PM  Result Value Ref Range   PSA 0.16 0.10 - 4.00 ng/mL  Uric acid   Collection Time: 03/10/24  2:54 PM  Result Value Ref Range   Uric Acid, Serum 5.3 4.0 - 7.8 mg/dL  CBC with Differential/Platelet   Collection Time: 03/10/24  2:54 PM  Result Value Ref Range   WBC 7.2 4.0 - 10.5 K/uL   RBC 4.26 4.22 - 5.81 Mil/uL   Hemoglobin 14.0 13.0 - 17.0 g/dL   HCT 13.0 86.5 - 78.4 %   MCV 97.1 78.0 - 100.0 fl   MCHC 33.8 30.0 - 36.0 g/dL   RDW 69.6 (H) 29.5 - 28.4 %   Platelets 158.0 150.0 - 400.0 K/uL   Neutrophils Relative % 61.0 43.0 - 77.0 %   Lymphocytes Relative 25.4 12.0 - 46.0 %   Monocytes Relative 10.1 3.0 - 12.0 %   Eosinophils Relative 2.9 0.0 - 5.0 %   Basophils Relative 0.6 0.0 - 3.0 %   Neutro Abs 4.4 1.4 - 7.7 K/uL   Lymphs Abs 1.8 0.7 - 4.0 K/uL   Monocytes Absolute 0.7 0.1 - 1.0 K/uL   Eosinophils Absolute 0.2 0.0 - 0.7 K/uL   Basophils Absolute 0.0 0.0 - 0.1 K/uL  Parathyroid  hormone, intact (no Ca)   Collection Time: 03/10/24  2:54 PM  Result Value Ref Range   PTH 32 16 - 77 pg/mL  Microalbumin / creatinine urine ratio   Collection Time: 03/10/24  2:54 PM  Result Value Ref Range   Microalb, Ur 11.3 (H) 0.0 - 1.9 mg/dL   Creatinine,U 132.4 mg/dL   Microalb Creat Ratio 82.4 (H) 0.0 - 30.0 mg/g  VITAMIN D  25 Hydroxy (Vit-D Deficiency, Fractures)   Collection Time: 03/10/24  2:54 PM  Result Value Ref Range   VITD 44.98 30.00 - 100.00 ng/mL  Phosphorus   Collection Time: 03/10/24  2:54 PM  Result Value Ref Range   Phosphorus 2.4 2.3 - 4.6 mg/dL  Hemoglobin M0N   Collection Time: 03/10/24  2:54 PM  Result Value Ref Range   Hgb A1c MFr Bld 6.0 4.6 - 6.5 %  Comprehensive metabolic panel with GFR   Collection Time: 03/10/24  2:54 PM  Result Value Ref Range   Sodium 141 135 - 145 mEq/L   Potassium 4.2 3.5 - 5.1 mEq/L   Chloride 106 96 - 112 mEq/L   CO2 26 19 - 32 mEq/L   Glucose, Bld 83 70 - 99 mg/dL   BUN 22 6 - 23  mg/dL   Creatinine, Ser 0.27 0.40 - 1.50 mg/dL   Total Bilirubin 0.4 0.2 - 1.2 mg/dL   Alkaline Phosphatase 80 39 - 117 U/L   AST 21 0 - 37 U/L   ALT 17 0 - 53 U/L   Total Protein 6.3 6.0 - 8.3 g/dL   Albumin 4.3 3.5 - 5.2 g/dL   GFR 25.36 (L) >64.40 mL/min   Calcium 9.0 8.4 - 10.5 mg/dL  Lipid panel  Collection Time: 03/10/24  2:54 PM  Result Value Ref Range   Cholesterol 137 0 - 200 mg/dL   Triglycerides 161.0 (H) 0.0 - 149.0 mg/dL   HDL 96.04 (L) >54.09 mg/dL   VLDL 81.1 (H) 0.0 - 91.4 mg/dL   LDL Cholesterol 61 0 - 99 mg/dL   Total CHOL/HDL Ratio 4    NonHDL 101.05    Lab Results  Component Value Date   PSA 0.16 03/10/2024   PSA 0.22 11/03/2023   PSA 0.10 09/11/2022   Assessment & Plan:   Problem List Items Addressed This Visit     Medicare annual wellness visit, subsequent - Primary (Chronic)   I have personally reviewed the Medicare Annual Wellness questionnaire and have noted 1. The patient's medical and social history 2. Their use of alcohol, tobacco or illicit drugs 3. Their current medications and supplements 4. The patient's functional ability including ADL's, fall risks, home safety risks and hearing or visual impairment. Cognitive function has been assessed and addressed as indicated.  5. Diet and physical activity 6. Evidence for depression or mood disorders The patients weight, height, BMI have been recorded in the chart. I have made referrals, counseling and provided education to the patient based on review of the above and I have provided the pt with a written personalized care plan for preventive services. Provider list updated.. See scanned questionairre as needed for further documentation. Reviewed preventative protocols and updated unless pt declined.       Advanced care planning/counseling discussion (Chronic)   Advanced directive - continues working on living will at home. HCPOA - wife. asked to bring us  a copy when completed      Health  maintenance examination (Chronic)   Preventative protocols reviewed and updated unless pt declined. Discussed healthy diet and lifestyle.       Type 2 diabetes mellitus with other specified complication (HCC)   Chronic, congratulated on significant improvement. He has been able to wean dose of insulin  down to 10u daily.  Continue this.  He plans to avoid budesonide  and other steroids in the future.       Relevant Medications   losartan  (COZAAR ) 50 MG tablet   metFORMIN  (GLUCOPHAGE -XR) 500 MG 24 hr tablet   insulin  glargine (LANTUS  SOLOSTAR) 100 UNIT/ML Solostar Pen   lovastatin  (MEVACOR ) 40 MG tablet   Hypertension   Chronic, stable period on current regimen.       Relevant Medications   atenolol  (TENORMIN ) 25 MG tablet   losartan  (COZAAR ) 50 MG tablet   lovastatin  (MEVACOR ) 40 MG tablet   Hyperlipidemia associated with type 2 diabetes mellitus (HCC)   Chronic, overall stable with LDL at 61. Continue current regimen of lovastatin  80mg  nightly.  Reviewed diet choices to improve triglyceride levels.  The ASCVD Risk score (Arnett DK, et al., 2019) failed to calculate for the following reasons:   Risk score cannot be calculated because patient has a medical history suggesting prior/existing ASCVD       Relevant Medications   atenolol  (TENORMIN ) 25 MG tablet   losartan  (COZAAR ) 50 MG tablet   metFORMIN  (GLUCOPHAGE -XR) 500 MG 24 hr tablet   insulin  glargine (LANTUS  SOLOSTAR) 100 UNIT/ML Solostar Pen   lovastatin  (MEVACOR ) 40 MG tablet   CAD (coronary artery disease)   Continue aspirin , statin. Encouraged cards f/u as due.       Relevant Medications   atenolol  (TENORMIN ) 25 MG tablet   losartan  (COZAAR ) 50 MG tablet   lovastatin  (MEVACOR ) 40 MG tablet  Severe obesity (BMI 35.0-39.9) with comorbidity (HCC)   Congratulated on weight loss to date  Continue to encourage healthy diet and lifestyle choices to affect sustainable weight loss.       Relevant Medications    metFORMIN  (GLUCOPHAGE -XR) 500 MG 24 hr tablet   insulin  glargine (LANTUS  SOLOSTAR) 100 UNIT/ML Solostar Pen   CKD stage 3 due to type 2 diabetes mellitus (HCC)   Reviewed CKD over the years - most recently stable period with GFR 50s.  Also discussed newly noted microalbuminuria in setting of recent worsening glycemic control.       Relevant Medications   losartan  (COZAAR ) 50 MG tablet   metFORMIN  (GLUCOPHAGE -XR) 500 MG 24 hr tablet   insulin  glargine (LANTUS  SOLOSTAR) 100 UNIT/ML Solostar Pen   lovastatin  (MEVACOR ) 40 MG tablet   Vitamin D  deficiency   Chronic, stable period on regular vit D 1000 units replacement      Lymphocytic colitis   Appreciate GI care - now on Apriso  2 capsules BID.  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       Relevant Medications   allopurinol  (ZYLOPRIM ) 100 MG tablet   BPH without obstruction/lower urinary tract symptoms   Notes ongoing BPH symptoms - agrees to trial flomax . Discussed mechanism of action as well as side effects to watch for.       Relevant Medications   tamsulosin  (FLOMAX ) 0.4 MG CAPS capsule   Recurrent genital herpes   Stable period on daily acyclovir  - discussed trying to slowly taper dosing as tolerated - try every other day dosing      Encephalomalacia without cerebral infarction   Chronic, stable period.       Immunoglobulin deficiency (HCC)   H/o IgG and IgM deficiency Has seen [facility]. Overall stable period.       Aortic stenosis   Chronic, known moderate AS on latest echo 09/2022.  Notes worsening exertional dyspnea of unclear cause - will update echo, encouraged cardiology f/u as due (Dr End).        Relevant Medications   atenolol  (TENORMIN ) 25 MG tablet   losartan  (COZAAR ) 50 MG  tablet   lovastatin  (MEVACOR ) 40 MG tablet   Other Relevant Orders   ECHOCARDIOGRAM COMPLETE   Low serum vitamin B12   Stable period on once weekly dosing - continue.       Type 2 diabetes mellitus with diabetic microalbuminuria (HCC)   Discussed this. See below.       Relevant Medications   losartan  (COZAAR ) 50 MG tablet   metFORMIN  (GLUCOPHAGE -XR) 500 MG 24 hr tablet   insulin  glargine (LANTUS  SOLOSTAR) 100 UNIT/ML Solostar Pen   lovastatin  (MEVACOR ) 40 MG tablet   Other Visit Diagnoses       Need for vaccination against Streptococcus pneumoniae       Relevant Orders   Pneumococcal conjugate vaccine 20-valent (Completed)     Exertional dyspnea       Relevant Orders   ECHOCARDIOGRAM COMPLETE        Meds ordered this encounter  Medications   allopurinol  (ZYLOPRIM ) 100 MG tablet    Sig: Take 1 tablet (100 mg total) by mouth 2 (two) times daily.    Dispense:  180 tablet    Refill:  4   atenolol  (TENORMIN ) 25 MG tablet    Sig: Take 1 tablet (25 mg total) by mouth daily.    Dispense:  90 tablet    Refill:  4   losartan  (COZAAR ) 50 MG tablet    Sig: Take 1 tablet (50 mg total) by mouth daily.    Dispense:  90 tablet    Refill:  4   metFORMIN  (GLUCOPHAGE -XR) 500 MG 24 hr tablet    Sig: Take 2 tablets (1,000 mg total) by mouth daily with breakfast.    Dispense:  180 tablet    Refill:  4   insulin  glargine (LANTUS  SOLOSTAR) 100 UNIT/ML Solostar Pen    Sig: Inject 10 Units into the skin daily.    Dispense:  15 mL    Refill:  2   lovastatin  (MEVACOR ) 40 MG tablet    Sig: Take 2 tablets (80 mg total) by mouth at bedtime.    Dispense:  180 tablet    Refill:  4   tamsulosin  (FLOMAX ) 0.4 MG CAPS capsule    Sig: Take 1 capsule (0.4 mg total) by mouth daily.    Dispense:  90 capsule    Refill:  4    Orders Placed This Encounter  Procedures   Pneumococcal conjugate vaccine 20-valent   ECHOCARDIOGRAM COMPLETE    Standing Status:   Future    Expiration Date:    03/17/2025    Where should this test be performed:   [facility] [place]    Perflutren  DEFINITY  (image enhancing agent) should be administered unless hypersensitivity or allergy exist:   Administer Perflutren     Reason for exam-Echo:   Murmur R01.1    Patient Instructions  Prevnar-20 today  Try acyclovir  every other day dosing.  Check with pharmacy to get 2nd shingrix shot to complete the series.  I will order updated heart ultrasound. Call to schedule follow up with Dr End.  Good to see you today Return as needed or in 6 months for diabetes follow up visit.   Follow up plan: Return in about 6 months (around 09/17/2024) for follow up visit.  Claire Crick, MD

## 2024-03-17 NOTE — Assessment & Plan Note (Addendum)
 Advanced directive - continues working on living will at home. HCPOA - wife. asked to bring us  a copy when completed

## 2024-03-17 NOTE — Assessment & Plan Note (Addendum)
 Notes ongoing BPH symptoms - agrees to trial flomax . Discussed mechanism of action as well as side effects to watch for.

## 2024-03-17 NOTE — Assessment & Plan Note (Signed)

## 2024-03-17 NOTE — Patient Instructions (Addendum)
 Prevnar-20 today  Try acyclovir  every other day dosing.  Check with pharmacy to get 2nd shingrix shot to complete the series.  I will order updated heart ultrasound. Call to schedule follow up with Dr End.  Good to see you today Return as needed or in 6 months for diabetes follow up visit.

## 2024-03-17 NOTE — Assessment & Plan Note (Signed)
Chronic, stable period on current regimen.  ?

## 2024-03-18 ENCOUNTER — Encounter: Payer: Self-pay | Admitting: Family Medicine

## 2024-03-18 DIAGNOSIS — E1129 Type 2 diabetes mellitus with other diabetic kidney complication: Secondary | ICD-10-CM | POA: Insufficient documentation

## 2024-03-18 DIAGNOSIS — E119 Type 2 diabetes mellitus without complications: Secondary | ICD-10-CM | POA: Insufficient documentation

## 2024-03-18 NOTE — Assessment & Plan Note (Signed)
 Chronic, congratulated on significant improvement. He has been able to wean dose of insulin  down to 10u daily.  Continue this.  He plans to avoid budesonide  and other steroids in the future.

## 2024-03-18 NOTE — Assessment & Plan Note (Signed)
 Appreciate GI care - now on Apriso  2 capsules BID.  Desires to avoid budesonide  due to hyperglycemia  Planning to follow Dr Baldomero Bone to [facility]

## 2024-03-18 NOTE — Assessment & Plan Note (Addendum)
 Chronic, known moderate AS on latest echo 09/2022.  Notes worsening exertional dyspnea of unclear cause - will update echo, encouraged cardiology f/u as due (Dr End).

## 2024-03-18 NOTE — Assessment & Plan Note (Signed)
 Discussed this. See below.

## 2024-03-18 NOTE — Assessment & Plan Note (Signed)
 Stable period on once weekly dosing - continue.

## 2024-03-18 NOTE — Assessment & Plan Note (Signed)
 Reviewed CKD over the years - most recently stable period with GFR 50s.  Also discussed newly noted microalbuminuria in setting of recent worsening glycemic control.

## 2024-03-18 NOTE — Assessment & Plan Note (Addendum)
 Stable period on daily acyclovir  - discussed trying to slowly taper dosing as tolerated - try every other day dosing

## 2024-03-18 NOTE — Assessment & Plan Note (Signed)
 Continue aspirin , statin. Encouraged cards f/u as due.

## 2024-03-18 NOTE — Assessment & Plan Note (Addendum)
 Chronic, overall stable with LDL at 61. Continue current regimen of lovastatin  80mg  nightly.  Reviewed diet choices to improve triglyceride levels.  The ASCVD Risk score (Arnett DK, et al., 2019) failed to calculate for the following reasons:   Risk score cannot be calculated because patient has a medical history suggesting prior/existing ASCVD

## 2024-03-18 NOTE — Assessment & Plan Note (Signed)
Chronic, stable period.  

## 2024-03-18 NOTE — Telephone Encounter (Signed)
 Requests denied:  Acyclovir  rx sent 03/16/24, #90/3 refills to [facility] order pharmacy.  Lantus  rx sent 5.21/25, #15 mL/2 refills [facility] mail order pharmacy. Accu-Chek Guide Me w/Device kit rx sent 12/25/23, #1 kit/ 0 refills to [facility] [address]

## 2024-03-18 NOTE — Assessment & Plan Note (Signed)
 Chronic, stable period on regular vit D 1000 units replacement

## 2024-03-18 NOTE — Assessment & Plan Note (Addendum)
 H/o IgG and IgM deficiency Has seen [facility]. Overall stable period.

## 2024-03-18 NOTE — Assessment & Plan Note (Addendum)
Congratulated on weight loss to date. Continue to encourage healthy diet and lifestyle choices to affect sustainable weight loss.

## 2024-03-18 NOTE — Assessment & Plan Note (Signed)
 Will need to further discuss at f/u

## 2024-03-23 ENCOUNTER — Encounter: Payer: Self-pay | Admitting: Family Medicine

## 2024-03-23 ENCOUNTER — Ambulatory Visit: Payer: Self-pay

## 2024-03-23 ENCOUNTER — Ambulatory Visit (INDEPENDENT_AMBULATORY_CARE_PROVIDER_SITE_OTHER): Admitting: Family Medicine

## 2024-03-23 VITALS — BP 164/64 | HR 90 | Temp 97.9°F | Ht 67.5 in | Wt 230.5 lb

## 2024-03-23 DIAGNOSIS — T50A95A Adverse effect of other bacterial vaccines, initial encounter: Secondary | ICD-10-CM | POA: Diagnosis not present

## 2024-03-23 DIAGNOSIS — R21 Rash and other nonspecific skin eruption: Secondary | ICD-10-CM

## 2024-03-23 MED ORDER — DOXYCYCLINE HYCLATE 100 MG PO TABS: 100.0000 mg | ORAL_TABLET | Freq: Two times a day (BID) | ORAL | 0 refills | Status: DC

## 2024-03-23 NOTE — Telephone Encounter (Addendum)
 Noted. Please add to my schedule today at 1pm.

## 2024-03-23 NOTE — Telephone Encounter (Signed)
 Pt currently scheduled on 03/24/24 at 9:40 with Dr Cherlyn Cornet  Lvm asking pt to call back. Need to offer OV today at 1:00 with Dr Crissie Dome.

## 2024-03-23 NOTE — Assessment & Plan Note (Signed)
 Rash most consistent with local reaction to prevnar-20 vaccine.  No systemic symptoms. I don't think pt needs repeat pneumococcal vaccine.  Not consistent with cellulitis. Area delineated, advised monitor for spread past line and if this happens then fill WASP for doxycycline  provided today - reviewing photosensitivity precautions on this antibiotic.  Update if not improving as expected over next few days.

## 2024-03-23 NOTE — Telephone Encounter (Signed)
 Pt rtn call. Offered to r/s OV for today with Dr Crissie Dome at 1:00. Pt agrees. Will have pt added to schedule.

## 2024-03-23 NOTE — Patient Instructions (Signed)
 Most likely large local reaction to pneumonia shot.  Monitor area delineated today.  If redness spreading past line, fill antibiotic prescription printed out today.

## 2024-03-23 NOTE — Telephone Encounter (Signed)
 Copied from CRM [id number]. Topic: Clinical - Red Word Triage >> Mar 23, 2024  9:04 AM Varney Gentleman wrote: Red Word that prompted transfer to Nurse Triage: Pneumonia shot last Wednesday 5/21, red spot around arm from injection site to elbow, swollen around injection site, and hot to the touch.    Chief Complaint: Last Wednesday pt. Had pneumonia shots Symptoms: Site tender, red Frequency: Wednesday Pertinent Negatives: Patient denies fever Disposition: [] ED /[] Urgent Care (no appt availability in office) / [x] Appointment(In office/virtual)/ []  [facility]/ [] Home Care/ [] Refused Recommended Disposition /[] [facility]/ []  Follow-up with PCP Additional Notes: agrees with appointment.  Reason for Disposition  [1] Redness or red streak around the injection site AND [2] begins > 48 hours after shot AND [3] no fever  (Exception: Red area < 1 inch or 2.5 cm wide.)  Answer Assessment - Initial Assessment Questions 1. SYMPTOMS: "What is the main symptom?" (e.g., redness, swelling, pain)      Redness, swelling 2. ONSET: "When was the vaccine (shot) given?" "How much later did the 1 day begin?" (e.g., hours, days ago)      1 day 3. SEVERITY: "How bad is it?"      Moderate 4. FEVER: "Is there a fever?" If Yes, ask: "What is it, how was it measured, and when did it start?"      No 5. IMMUNIZATIONS GIVEN: "What shots have you recently received?"     Pneumonia  6. PAST REACTIONS: "Have you reacted to immunizations before?" If Yes, ask: "What happened?"     no 7. OTHER SYMPTOMS: "Do you have any other symptoms?"     no  Protocols used: Immunization Reactions-A-AH

## 2024-03-23 NOTE — Progress Notes (Signed)
 Ph: [phone] Fax: [phone]   Patient ID: Thomas Carlson, male    DOB: [birth year], 78 y.o.   MRN: [id number]  This visit was conducted in person.  BP (!) 164/64   Pulse 90   Temp 97.9 F (36.6 C) (Oral)   Ht 5' 7.5" (1.715 m)   Wt 230 lb 8 oz (104.6 kg)   SpO2 94%   BMI 35.57 kg/m   BP Readings from Last 3 Encounters:  03/23/24 (!) 164/64  03/17/24 132/78  12/30/23 (!) 147/82   CC: local reaction to vaccine  Subjective:   HPI: DANIELA HERNAN is a 78 y.o. male presenting on 03/23/2024 for Medication Reaction (Pt received Prevnar-20 vaccine- 03/17/24. C/o red spot on R upper arm from injection site to elbow, swelling and area is hot to the touch.)   Received Prevnar-20 at CPE 03/17/2024.  3 d afterwards developed redness to injection site which has spread to elbow. Area is swollen, hot and tender to the touch  No systemic symptoms. Hasn't tried anything for this yet.  Redness is improving over the past 24 hours.   Pneumovax 06/2012, prevnar-13 07/2016, prevnar-20 02/2024.   H/o mild immunoglobulin deficiency - ?CVID. Saw [facility].      Relevant past medical, surgical, family and social history reviewed and updated as indicated. Interim medical history since our last visit reviewed. Allergies and medications reviewed and updated. Outpatient Medications Prior to Visit  Medication Sig Dispense Refill   Accu-Chek Softclix Lancets lancets Use as instructed to check blood sugar once a day 100 each 3   acyclovir  (ZOVIRAX ) 400 MG tablet TAKE 1 TABLET EVERY DAY 90 tablet 3   allopurinol  (ZYLOPRIM ) 100 MG tablet Take 1 tablet (100 mg total) by mouth 2 (two) times daily. 180 tablet 4   aspirin  EC 81 MG tablet Take 1 tablet (81 mg total) by mouth daily.     atenolol  (TENORMIN ) 25 MG tablet Take 1 tablet (25 mg total) by mouth daily. 90 tablet 4   Biotin  1000 MCG tablet Take 1,000 mcg by mouth daily.      Blood Glucose Monitoring Suppl (ACCU-CHEK GUIDE ME) w/Device KIT Use  as instructed to check blood sugar once a day 1 kit 0   Cholecalciferol  (VITAMIN D3) 25 MCG (1000 UT) CAPS Take 1 capsule (1,000 Units total) by mouth daily. 30 capsule    Cyanocobalamin  (B-12) 1000 MCG SUBL Place 1 tablet under the tongue once a week.     glucose blood (ACCU-CHEK GUIDE TEST) test strip Use as instructed to check blood sugar once a day 100 each 2   insulin  glargine (LANTUS  SOLOSTAR) 100 UNIT/ML Solostar Pen Inject 10 Units into the skin daily. 15 mL 2   Insulin  Pen Needle (PEN NEEDLES) 31G X 5 MM MISC Use as directed to inject insulin  daily 300 each 3   losartan  (COZAAR ) 50 MG tablet Take 1 tablet (50 mg total) by mouth daily. 90 tablet 4   lovastatin  (MEVACOR ) 40 MG tablet Take 2 tablets (80 mg total) by mouth at bedtime. 180 tablet 4   Magnesium  500 MG TABS Take 500 mg by mouth daily.     mesalamine  (APRISO ) 0.375 g 24 hr capsule Take 2 capsules (0.75 g total) by mouth 2 (two) times daily. 360 capsule 0   metFORMIN  (GLUCOPHAGE -XR) 500 MG 24 hr tablet Take 2 tablets (1,000 mg total) by mouth daily with breakfast. 180 tablet 4   tamsulosin  (FLOMAX ) 0.4 MG CAPS capsule Take 1  capsule (0.4 mg total) by mouth daily. 90 capsule 4   Continuous Glucose Sensor (FREESTYLE LIBRE 3 SENSOR) MISC Place 1 sensor on the skin every 14 days. Use to check glucose continuously E11.69, Z79.4 (Patient not taking: Reported on 03/17/2024) 2 each 3   No facility-administered medications prior to visit.     Per HPI unless specifically indicated in ROS section below Review of Systems  Objective:  BP (!) 164/64   Pulse 90   Temp 97.9 F (36.6 C) (Oral)   Ht 5' 7.5" (1.715 m)   Wt 230 lb 8 oz (104.6 kg)   SpO2 94%   BMI 35.57 kg/m   Wt Readings from Last 3 Encounters:  03/23/24 230 lb 8 oz (104.6 kg)  03/17/24 229 lb 4 oz (104 kg)  12/30/23 231 lb 6.4 oz (105 kg)      Physical Exam Vitals and nursing note reviewed.  Constitutional:      Appearance: Normal appearance. He is not  ill-appearing.  Skin:    General: Skin is warm and dry.     Findings: Erythema and rash present.          Comments: Erythema from below R shoulder to elbow at site of recent prevnar injection.   Neurological:     Mental Status: He is alert.  Psychiatric:        Mood and Affect: Mood normal.        Behavior: Behavior normal.             Assessment & Plan:   Problem List Items Addressed This Visit     Local reaction to pneumococcal vaccine - Primary   Rash most consistent with local reaction to prevnar-20 vaccine.  No systemic symptoms. I don't think pt needs repeat pneumococcal vaccine.  Not consistent with cellulitis. Area delineated, advised monitor for spread past line and if this happens then fill WASP for doxycycline  provided today - reviewing photosensitivity precautions on this antibiotic.  Update if not improving as expected over next few days.         Meds ordered this encounter  Medications   doxycycline  (VIBRA -TABS) 100 MG tablet    Sig: Take 1 tablet (100 mg total) by mouth 2 (two) times daily.    Dispense:  20 tablet    Refill:  0    No orders of the defined types were placed in this encounter.   Patient Instructions  Most likely large local reaction to pneumonia shot.  Monitor area delineated today.  If redness spreading past line, fill antibiotic prescription printed out today.   Follow up plan: No follow-ups on file.  Claire Crick, MD

## 2024-03-24 ENCOUNTER — Ambulatory Visit: Admitting: Family Medicine

## 2024-04-19 ENCOUNTER — Ambulatory Visit: Attending: Family Medicine

## 2024-04-19 DIAGNOSIS — I350 Nonrheumatic aortic (valve) stenosis: Secondary | ICD-10-CM | POA: Diagnosis not present

## 2024-04-19 DIAGNOSIS — R0609 Other forms of dyspnea: Secondary | ICD-10-CM | POA: Diagnosis not present

## 2024-04-19 LAB — ECHOCARDIOGRAM COMPLETE
AR max vel: 1.66 cm2
AV Area VTI: 1.69 cm2
AV Area mean vel: 1.66 cm2
AV Mean grad: 30 mmHg
AV Peak grad: 54 mmHg
Ao pk vel: 3.67 m/s
Area-P 1/2: 3.12 cm2
Calc EF: 55.7 %
P 1/2 time: 385 ms
S' Lateral: 3.4 cm
Single Plane A2C EF: 56.2 %
Single Plane A4C EF: 56.3 %

## 2024-04-23 ENCOUNTER — Ambulatory Visit: Payer: Self-pay | Admitting: Family Medicine

## 2024-04-23 DIAGNOSIS — I350 Nonrheumatic aortic (valve) stenosis: Secondary | ICD-10-CM

## 2024-05-10 ENCOUNTER — Other Ambulatory Visit: Payer: Self-pay | Admitting: Family Medicine

## 2024-05-10 ENCOUNTER — Other Ambulatory Visit: Payer: Self-pay

## 2024-05-10 DIAGNOSIS — E1169 Type 2 diabetes mellitus with other specified complication: Secondary | ICD-10-CM

## 2024-05-10 DIAGNOSIS — K52832 Lymphocytic colitis: Secondary | ICD-10-CM

## 2024-05-10 MED ORDER — MESALAMINE ER 0.375 G PO CP24: 750.0000 mg | ORAL_CAPSULE | Freq: Two times a day (BID) | ORAL | 0 refills | Status: DC

## 2024-05-10 NOTE — Addendum Note (Signed)
 Addended by: LANNIE ANDREA GRADE on: 05/10/2024 09:59 AM   Modules accepted: Orders

## 2024-05-10 NOTE — Telephone Encounter (Signed)
 Copied from CRM [id number]. Topic: Clinical - Prescription Issue >> May 10, 2024  8:57 AM Powell HERO wrote: Reason for CRM: insulin  glargine (LANTUS  SOLOSTAR) 100 UNIT/ML Solostar Pen,  patient states he only has a couple days left and is worried about running out.  Supposed to get it through [facility], states he has refills but has not heard anything from them.

## 2024-05-10 NOTE — Telephone Encounter (Signed)
 It was supposed to go to mail order... Pt will call to schedule OV

## 2024-05-10 NOTE — Telephone Encounter (Signed)
 Pt called to request a refill... You saw pt 12/2023... Pt was given [facility] number to call and schedule a f/u to establish with you at [facility]... Can you send in refill in the meantime?

## 2024-05-10 NOTE — Telephone Encounter (Signed)
 Lantus  Last rx:  03/17/24, #15 mL Last OV:  03/23/24, med reaction Next OV:  09/20/24/25, 6 mo f/u

## 2024-05-13 MED ORDER — LANTUS SOLOSTAR 100 UNIT/ML ~~LOC~~ SOPN: 10.0000 [IU] | PEN_INJECTOR | Freq: Every day | SUBCUTANEOUS | 2 refills | Status: DC

## 2024-05-13 NOTE — Telephone Encounter (Signed)
 Plz notify this was sent in. Does he need a short term supply sent locally?

## 2024-05-13 NOTE — Telephone Encounter (Signed)
 Spoke with pt relaying Dr Talmadge message and asking if short term supply is needed. Pt verbalizes understanding and states he has enough to last until shipment from mail order.

## 2024-05-19 ENCOUNTER — Ambulatory Visit: Attending: Internal Medicine | Admitting: Internal Medicine

## 2024-05-19 VITALS — BP 128/68 | HR 70 | Ht 68.0 in | Wt 237.2 lb

## 2024-05-19 DIAGNOSIS — I350 Nonrheumatic aortic (valve) stenosis: Secondary | ICD-10-CM

## 2024-05-19 DIAGNOSIS — I10 Essential (primary) hypertension: Secondary | ICD-10-CM | POA: Diagnosis not present

## 2024-05-19 DIAGNOSIS — Z79899 Other long term (current) drug therapy: Secondary | ICD-10-CM | POA: Diagnosis not present

## 2024-05-19 DIAGNOSIS — E1169 Type 2 diabetes mellitus with other specified complication: Secondary | ICD-10-CM

## 2024-05-19 DIAGNOSIS — E785 Hyperlipidemia, unspecified: Secondary | ICD-10-CM | POA: Diagnosis not present

## 2024-05-19 DIAGNOSIS — I2510 Atherosclerotic heart disease of native coronary artery without angina pectoris: Secondary | ICD-10-CM | POA: Diagnosis not present

## 2024-05-19 MED ORDER — ROSUVASTATIN CALCIUM 20 MG PO TABS: 20.0000 mg | ORAL_TABLET | Freq: Every day | ORAL | 3 refills | Status: DC

## 2024-05-19 NOTE — Patient Instructions (Signed)
 Medication Instructions:  Your physician recommends the following medication changes.  STOP TAKING: Lovastatin    START TAKING: Rosuvastatin  20 mg by mouth daily (Please start 2 days after stopping lovastatin )    *If you need a refill on your cardiac medications before your next appointment, please call your pharmacy*  Lab Work: Your provider would like for you to return in 3 months  to have the following labs drawn: Lipid, ALT.   Please go to [facility] [address] ([facility] [address], [place] [address] You do not need an appointment.  They are open from 8 am- 4:30 pm.  Lunch from 1:00 pm- 2:00 pm You will need to be fasting.    Testing/Procedures: No test ordered today   Follow-Up: At [facility], you and your health needs are our priority.  As part of our continuing mission to provide you with exceptional heart care, our providers are all part of one team.  This team includes your primary Cardiologist (physician) and Advanced Practice Providers or APPs (Physician Assistants and Nurse Practitioners) who all work together to provide you with the care you need, when you need it.  Your next appointment:   6 month(s)  Provider:   You may see Lonni Hanson, MD or one of the following Advanced Practice Providers on your designated Care Team:   Lonni Meager, NP Lesley Maffucci, PA-C Bernardino Bring, PA-C Cadence [place], PA-C Tylene Lunch, NP Barnie Hila, NP

## 2024-05-19 NOTE — Progress Notes (Unsigned)
 Cardiology Office Note:  .   Date:  05/21/2024  ID:  Thomas Carlson, DOB [birth year], MRN [id number] PCP: [name] Baller, MD  [facility] Providers Cardiologist:  Lonni Hanson, MD     History of Present Illness: .   Thomas Carlson is a 78 y.o. male with history aortic stenosis and regurgitation (echo in 09/2022 demonstrating moderate AS and mild AI), coronary artery disease with PCI to the proximal LAD in 2005, hypertension, hyperlipidemia, type 2 diabetes mellitus, and remote seizure in the setting of subdural hematoma, who presents for follow-up of valvular heart and coronary artery disease.  I met him in 11/2022, at which time he transitioned his care from [facility] to our practice.  He noted stable exertional dyspnea when walking uphill but was otherwise asymptomatic.  Follow-up echo last month showed moderate AS with mean gradient 30 mmHg and AVA 1.7 cm^2 along with mild aortic regurgitation.  Today, Thomas Carlson that he feels fairly similar to our prior visit.  He has exertional dyspnea when walking a couple 100 yards, which has been stable for over a year.  He denies chest pain, palpitations, lightheadedness, and edema.  However, he also reports that his activity is fairly limited because he tires out easily.  Home blood pressures are typically in the 120-135/78-82 range.  He is tolerating his medications well.  On further questioning, he notes that he was asymptomatic leading up to his PCI to the LAD in 2005, and that abnormalities were discovered on a routine stress test leading to catheterization.  ROS: See HPI  Studies Reviewed: [name]   EKG Interpretation Date/Time:  Wednesday May 19 2024 16:05:27 EDT Ventricular Rate:  70 PR Interval:  166 QRS Duration:  92 QT Interval:  390 QTC Calculation: 421 R Axis:   -1  Text Interpretation: Normal sinus rhythm Normal ECG When compared with ECG of 09-Apr-2007 08:59, Premature ventricular complexes are no longer  Present Otherwise no significant change Confirmed by Tanita Palinkas ([id number]) on 05/19/2024 4:08:26 PM    TTE (04/19/2024): Normal LV size and wall thickness.  LVEF 55-60% with grade 1 diastolic dysfunction.  Normal RV size and function.  Normal biatrial size.  No pericardial effusion.   Mild MR.  Mild-moderate aortic regurgitation and moderate aortic stenosis (mean gradient 30 mmHg, AVA 1.7 cm).  Normal CVP.  Risk Assessment/Calculations:        Physical Exam:   VS:  BP 128/68 (BP Location: Left Arm, Patient Position: Sitting, Cuff Size: Large)   Pulse 70   Ht 5' 8 (1.727 m)   Wt 237 lb 4 oz (107.6 kg)   SpO2 98%   BMI 36.07 kg/m    Wt Readings from Last 3 Encounters:  05/19/24 237 lb 4 oz (107.6 kg)  03/23/24 230 lb 8 oz (104.6 kg)  03/17/24 229 lb 4 oz (104 kg)    General:  NAD. Neck: No JVD or HJR. Lungs: Clear to auscultation bilaterally without wheezes or crackles. Heart: Regular rate and rhythm with 3/6 systolic murmur. Abdomen: Soft, nontender, nondistended. Extremities: No lower extremity edema.  ASSESSMENT AND PLAN: .    Aortic stenosis: Thomas Carlson reports stable exertional dyspnea present for more than a year that is likely multifactorial.  I have personally reviewed his recent echocardiogram, which shows aortic stenosis in the moderate range.  There is also mild to moderate regurgitation.  We discussed rationale for valve intervention if it becomes severe or if symptoms progress.  We will plan to repeat  an echocardiogram in about a year, sooner if symptoms progress in the meantime.  Coronary artery disease: No angina reported though Thomas Carlson has chronic exertional dyspnea.  Question if this could represent an anginal equivalent.  Of note, he was asymptomatic prior to his PCI of the LAD in 2005.  His recent echocardiogram did not show any wall motion abnormalities.  We discussed noninvasive ischemia testing and cardiac catheterization but have agreed to defer this  given stable symptoms.  Continue secondary prevention with aspirin , atenolol , and statin therapy; will transition from lovastatin  to rosuvastatin  to optimize this.  Hyperlipidemia associated with type 2 diabetes mellitus: Lipids reasonable on last check on high-dose lovastatin .  However, in an effort to optimize his secondary prevention of CAD, we have agreed to switch to a high intensity statin (rosuvastatin  20 mg daily).  Will plan to repeat a lipid panel and ALT in about 3 months.  Ongoing management of DM per Dr. Arlis.  Hypertension: Blood pressure well-controlled today.  No medication changes at this time.    Dispo: Return to clinic in 6 months.  Anticipate repeating echocardiogram in 1 year, sooner if symptoms progress in the meantime.  Signed, Lonni Hanson, MD

## 2024-05-21 ENCOUNTER — Encounter: Payer: Self-pay | Admitting: Internal Medicine

## 2024-06-16 ENCOUNTER — Ambulatory Visit: Admitting: Internal Medicine

## 2024-06-16 NOTE — Progress Notes (Unsigned)
 Erroneous encounter

## 2024-07-23 ENCOUNTER — Telehealth: Payer: Self-pay

## 2024-07-23 DIAGNOSIS — K52832 Lymphocytic colitis: Secondary | ICD-10-CM

## 2024-07-23 NOTE — Addendum Note (Signed)
 Addended by: [name] BALLER on: 07/23/2024 04:36 PM   Modules accepted: Orders

## 2024-07-23 NOTE — Telephone Encounter (Signed)
 Copied from CRM [id number]. Topic: Referral - Request for Referral >> Jul 23, 2024  2:30 PM Rea ORN wrote: Did the patient discuss referral with their provider in the last year? No (If No - schedule appointment) (If Yes - send message)  Appointment offered? Yes, Pt stated he has already seen this provider and she she switched health networks. They are requiring a new referral  Type of order/referral and detailed reason for visit: Diarrhea  Preference of office, provider, location:  Dr. Corinn Brooklyn [facility] [address] [place], [place] [address] Phone: [phone] Fax: [phone]  If referral order, have you been seen by this specialty before? Yes, pt stated he saw her frequently but did not advise last visit date. (If Yes, this issue or another issue? When? Where?  Can we respond through MyChart? Yes

## 2024-07-23 NOTE — Telephone Encounter (Signed)
 Plz notify new referral placed to [facility].  This is an established patient with Dr Unk.

## 2024-07-30 ENCOUNTER — Encounter: Payer: Self-pay | Admitting: *Deleted

## 2024-07-30 NOTE — Telephone Encounter (Signed)
See referral for updates

## 2024-08-09 DIAGNOSIS — K529 Noninfective gastroenteritis and colitis, unspecified: Secondary | ICD-10-CM | POA: Diagnosis not present

## 2024-08-11 DIAGNOSIS — Z79899 Other long term (current) drug therapy: Secondary | ICD-10-CM | POA: Diagnosis not present

## 2024-08-12 ENCOUNTER — Ambulatory Visit: Payer: Self-pay | Admitting: Internal Medicine

## 2024-08-12 LAB — ALT: ALT: 26 IU/L (ref 0–44)

## 2024-08-12 LAB — LIPID PANEL
Chol/HDL Ratio: 2.3 ratio (ref 0.0–5.0)
Cholesterol, Total: 111 mg/dL (ref 100–199)
HDL: 48 mg/dL (ref >39)
LDL Chol Calc (NIH): 47 mg/dL (ref 0–99)
Triglycerides: 80 mg/dL (ref 0–149)
VLDL Cholesterol Cal: 16 mg/dL (ref 5–40)

## 2024-08-17 ENCOUNTER — Other Ambulatory Visit: Payer: Self-pay

## 2024-08-17 ENCOUNTER — Ambulatory Visit (INDEPENDENT_AMBULATORY_CARE_PROVIDER_SITE_OTHER)
Admission: EM | Admit: 2024-08-17 | Discharge: 2024-08-17 | Disposition: A | Discharge status: Other Acute Inpatient Hospital | Source: Home / Self Care | Attending: Emergency Medicine | Admitting: Emergency Medicine

## 2024-08-17 ENCOUNTER — Encounter: Payer: Self-pay | Admitting: Emergency Medicine

## 2024-08-17 ENCOUNTER — Ambulatory Visit (INDEPENDENT_AMBULATORY_CARE_PROVIDER_SITE_OTHER)

## 2024-08-17 ENCOUNTER — Inpatient Hospital Stay
Admission: EM | Admit: 2024-08-17 | Discharge: 2024-08-19 | DRG: 304 | Disposition: A | Discharge status: Home / Self Care | Attending: Internal Medicine | Admitting: Internal Medicine

## 2024-08-17 DIAGNOSIS — R319 Hematuria, unspecified: Secondary | ICD-10-CM | POA: Diagnosis not present

## 2024-08-17 DIAGNOSIS — I130 Hypertensive heart and chronic kidney disease with heart failure and stage 1 through stage 4 chronic kidney disease, or unspecified chronic kidney disease: Secondary | ICD-10-CM | POA: Diagnosis not present

## 2024-08-17 DIAGNOSIS — K52832 Lymphocytic colitis: Secondary | ICD-10-CM | POA: Diagnosis present

## 2024-08-17 DIAGNOSIS — I493 Ventricular premature depolarization: Secondary | ICD-10-CM | POA: Diagnosis not present

## 2024-08-17 DIAGNOSIS — A6002 Herpesviral infection of other male genital organs: Secondary | ICD-10-CM | POA: Diagnosis present

## 2024-08-17 DIAGNOSIS — Z955 Presence of coronary angioplasty implant and graft: Secondary | ICD-10-CM

## 2024-08-17 DIAGNOSIS — I160 Hypertensive urgency: Secondary | ICD-10-CM

## 2024-08-17 DIAGNOSIS — I161 Hypertensive emergency: Secondary | ICD-10-CM | POA: Diagnosis not present

## 2024-08-17 DIAGNOSIS — Z823 Family history of stroke: Secondary | ICD-10-CM

## 2024-08-17 DIAGNOSIS — I159 Secondary hypertension, unspecified: Secondary | ICD-10-CM

## 2024-08-17 DIAGNOSIS — I5A Non-ischemic myocardial injury (non-traumatic): Secondary | ICD-10-CM | POA: Diagnosis not present

## 2024-08-17 DIAGNOSIS — R808 Other proteinuria: Secondary | ICD-10-CM | POA: Diagnosis present

## 2024-08-17 DIAGNOSIS — R809 Proteinuria, unspecified: Secondary | ICD-10-CM

## 2024-08-17 DIAGNOSIS — K529 Noninfective gastroenteritis and colitis, unspecified: Secondary | ICD-10-CM | POA: Diagnosis present

## 2024-08-17 DIAGNOSIS — E1169 Type 2 diabetes mellitus with other specified complication: Secondary | ICD-10-CM | POA: Diagnosis not present

## 2024-08-17 DIAGNOSIS — Z79899 Other long term (current) drug therapy: Secondary | ICD-10-CM | POA: Diagnosis not present

## 2024-08-17 DIAGNOSIS — E1129 Type 2 diabetes mellitus with other diabetic kidney complication: Secondary | ICD-10-CM | POA: Diagnosis present

## 2024-08-17 DIAGNOSIS — R600 Localized edema: Secondary | ICD-10-CM | POA: Diagnosis not present

## 2024-08-17 DIAGNOSIS — Z96651 Presence of right artificial knee joint: Secondary | ICD-10-CM | POA: Diagnosis present

## 2024-08-17 DIAGNOSIS — I2489 Other forms of acute ischemic heart disease: Secondary | ICD-10-CM | POA: Diagnosis present

## 2024-08-17 DIAGNOSIS — Z794 Long term (current) use of insulin: Secondary | ICD-10-CM

## 2024-08-17 DIAGNOSIS — J90 Pleural effusion, not elsewhere classified: Secondary | ICD-10-CM

## 2024-08-17 DIAGNOSIS — R001 Bradycardia, unspecified: Secondary | ICD-10-CM | POA: Diagnosis not present

## 2024-08-17 DIAGNOSIS — E1122 Type 2 diabetes mellitus with diabetic chronic kidney disease: Secondary | ICD-10-CM | POA: Diagnosis not present

## 2024-08-17 DIAGNOSIS — Z882 Allergy status to sulfonamides status: Secondary | ICD-10-CM

## 2024-08-17 DIAGNOSIS — Z7982 Long term (current) use of aspirin: Secondary | ICD-10-CM

## 2024-08-17 DIAGNOSIS — E785 Hyperlipidemia, unspecified: Secondary | ICD-10-CM

## 2024-08-17 DIAGNOSIS — Z888 Allergy status to other drugs, medicaments and biological substances status: Secondary | ICD-10-CM

## 2024-08-17 DIAGNOSIS — Z6836 Body mass index (BMI) 36.0-36.9, adult: Secondary | ICD-10-CM | POA: Diagnosis not present

## 2024-08-17 DIAGNOSIS — Z7984 Long term (current) use of oral hypoglycemic drugs: Secondary | ICD-10-CM

## 2024-08-17 DIAGNOSIS — N400 Enlarged prostate without lower urinary tract symptoms: Secondary | ICD-10-CM | POA: Diagnosis present

## 2024-08-17 DIAGNOSIS — I2510 Atherosclerotic heart disease of native coronary artery without angina pectoris: Secondary | ICD-10-CM | POA: Diagnosis not present

## 2024-08-17 DIAGNOSIS — N1831 Chronic kidney disease, stage 3a: Secondary | ICD-10-CM | POA: Diagnosis not present

## 2024-08-17 DIAGNOSIS — M109 Gout, unspecified: Secondary | ICD-10-CM | POA: Diagnosis present

## 2024-08-17 DIAGNOSIS — E876 Hypokalemia: Secondary | ICD-10-CM | POA: Diagnosis present

## 2024-08-17 DIAGNOSIS — E66812 Obesity, class 2: Secondary | ICD-10-CM | POA: Diagnosis present

## 2024-08-17 DIAGNOSIS — E669 Obesity, unspecified: Secondary | ICD-10-CM | POA: Diagnosis present

## 2024-08-17 DIAGNOSIS — E7849 Other hyperlipidemia: Secondary | ICD-10-CM | POA: Diagnosis present

## 2024-08-17 DIAGNOSIS — I5033 Acute on chronic diastolic (congestive) heart failure: Secondary | ICD-10-CM | POA: Diagnosis not present

## 2024-08-17 DIAGNOSIS — Z825 Family history of asthma and other chronic lower respiratory diseases: Secondary | ICD-10-CM

## 2024-08-17 DIAGNOSIS — I10 Essential (primary) hypertension: Secondary | ICD-10-CM | POA: Diagnosis not present

## 2024-08-17 DIAGNOSIS — Z880 Allergy status to penicillin: Secondary | ICD-10-CM

## 2024-08-17 DIAGNOSIS — R918 Other nonspecific abnormal finding of lung field: Secondary | ICD-10-CM | POA: Diagnosis not present

## 2024-08-17 DIAGNOSIS — Z833 Family history of diabetes mellitus: Secondary | ICD-10-CM

## 2024-08-17 DIAGNOSIS — Z8249 Family history of ischemic heart disease and other diseases of the circulatory system: Secondary | ICD-10-CM

## 2024-08-17 LAB — URINALYSIS, ROUTINE W REFLEX MICROSCOPIC
Bilirubin Urine: NEGATIVE
Glucose, UA: NEGATIVE mg/dL
Ketones, ur: NEGATIVE mg/dL
Leukocytes,Ua: NEGATIVE
Nitrite: NEGATIVE
Protein, ur: >300 mg/dL — AB
Specific Gravity, Urine: 1.025 (ref 1.005–1.030)
pH: 5.5 (ref 5.0–8.0)

## 2024-08-17 LAB — CBC WITH DIFFERENTIAL/PLATELET
Abs Immature Granulocytes: 0.04 K/uL (ref 0.00–0.07)
Basophils Absolute: 0 K/uL (ref 0.0–0.1)
Basophils Relative: 0 %
Eosinophils Absolute: 0 K/uL (ref 0.0–0.5)
Eosinophils Relative: 0 %
HCT: 35 % — ABNORMAL LOW (ref 39.0–52.0)
Hemoglobin: 12.2 g/dL — ABNORMAL LOW (ref 13.0–17.0)
Immature Granulocytes: 0 %
Lymphocytes Relative: 9 %
Lymphs Abs: 1.1 K/uL (ref 0.7–4.0)
MCH: 31.7 pg (ref 26.0–34.0)
MCHC: 34.9 g/dL (ref 30.0–36.0)
MCV: 90.9 fL (ref 80.0–100.0)
Monocytes Absolute: 0.6 K/uL (ref 0.1–1.0)
Monocytes Relative: 6 %
Neutro Abs: 9.7 K/uL — ABNORMAL HIGH (ref 1.7–7.7)
Neutrophils Relative %: 85 %
Platelets: 266 K/uL (ref 150–400)
RBC: 3.85 MIL/uL — ABNORMAL LOW (ref 4.22–5.81)
RDW: 16.7 % — ABNORMAL HIGH (ref 11.5–15.5)
WBC: 11.4 K/uL — ABNORMAL HIGH (ref 4.0–10.5)
nRBC: 0 % (ref 0.0–0.2)

## 2024-08-17 LAB — BASIC METABOLIC PANEL WITH GFR
Anion gap: 8 (ref 5–15)
BUN: 30 mg/dL — ABNORMAL HIGH (ref 8–23)
CO2: 26 mmol/L (ref 22–32)
Calcium: 8.4 mg/dL — ABNORMAL LOW (ref 8.9–10.3)
Chloride: 108 mmol/L (ref 98–111)
Creatinine, Ser: 1.19 mg/dL (ref 0.61–1.24)
GFR, Estimated: >60 mL/min (ref >60)
Glucose, Bld: 139 mg/dL — ABNORMAL HIGH (ref 70–99)
Potassium: 3.2 mmol/L — ABNORMAL LOW (ref 3.5–5.1)
Sodium: 142 mmol/L (ref 135–145)

## 2024-08-17 LAB — URINALYSIS, MICROSCOPIC (REFLEX)

## 2024-08-17 LAB — CBG MONITORING, ED: Glucose-Capillary: 121 mg/dL — ABNORMAL HIGH (ref 70–99)

## 2024-08-17 LAB — BRAIN NATRIURETIC PEPTIDE: B Natriuretic Peptide: 1134.2 pg/mL — ABNORMAL HIGH (ref 0.0–100.0)

## 2024-08-17 LAB — TROPONIN I (HIGH SENSITIVITY)
Troponin I (High Sensitivity): 33 ng/L — ABNORMAL HIGH (ref <18)
Troponin I (High Sensitivity): 36 ng/L — ABNORMAL HIGH (ref <18)

## 2024-08-17 LAB — MAGNESIUM: Magnesium: 2.1 mg/dL (ref 1.7–2.4)

## 2024-08-17 MED ORDER — HYDRALAZINE HCL 20 MG/ML IJ SOLN
10.0000 mg | INTRAMUSCULAR | Status: DC | PRN
  Administered 2024-08-18 (×2): 10 mg via INTRAVENOUS
  Filled 2024-08-17 (×2): qty 1

## 2024-08-17 MED ORDER — DM-GUAIFENESIN ER 30-600 MG PO TB12: 1.0000 | ORAL_TABLET | Freq: Two times a day (BID) | ORAL | Status: DC | PRN

## 2024-08-17 MED ORDER — NITROGLYCERIN 0.2 MG/HR TD PT24
0.2000 mg | MEDICATED_PATCH | Freq: Once | TRANSDERMAL | Status: DC
  Administered 2024-08-17: 0.2 mg via TRANSDERMAL
  Filled 2024-08-17: qty 1

## 2024-08-17 MED ORDER — FUROSEMIDE 10 MG/ML IJ SOLN
40.0000 mg | Freq: Once | INTRAMUSCULAR | Status: AC
  Administered 2024-08-17: 40 mg via INTRAVENOUS
  Filled 2024-08-17: qty 4

## 2024-08-17 MED ORDER — NITROGLYCERIN 0.1 MG/HR TD PT24: 0.1000 mg | MEDICATED_PATCH | Freq: Every day | TRANSDERMAL | Status: DC

## 2024-08-17 MED ORDER — INSULIN ASPART 100 UNIT/ML IJ SOLN: 0.0000 [IU] | Freq: Every day | INTRAMUSCULAR | Status: DC

## 2024-08-17 MED ORDER — ONDANSETRON HCL 4 MG/2ML IJ SOLN: 4.0000 mg | Freq: Three times a day (TID) | INTRAMUSCULAR | Status: DC | PRN

## 2024-08-17 MED ORDER — ALBUTEROL SULFATE (2.5 MG/3ML) 0.083% IN NEBU: 3.0000 mL | INHALATION_SOLUTION | RESPIRATORY_TRACT | Status: DC | PRN

## 2024-08-17 MED ORDER — FUROSEMIDE 10 MG/ML IJ SOLN: 20.0000 mg | Freq: Two times a day (BID) | INTRAMUSCULAR | Status: DC

## 2024-08-17 MED ORDER — POTASSIUM CHLORIDE CRYS ER 20 MEQ PO TBCR
40.0000 meq | EXTENDED_RELEASE_TABLET | Freq: Once | ORAL | Status: AC
  Administered 2024-08-17: 40 meq via ORAL
  Filled 2024-08-17: qty 2

## 2024-08-17 MED ORDER — HYDRALAZINE HCL 20 MG/ML IJ SOLN
20.0000 mg | Freq: Once | INTRAMUSCULAR | Status: AC
  Administered 2024-08-17: 20 mg via INTRAVENOUS
  Filled 2024-08-17: qty 1

## 2024-08-17 MED ORDER — INSULIN ASPART 100 UNIT/ML IJ SOLN
0.0000 [IU] | Freq: Three times a day (TID) | INTRAMUSCULAR | Status: DC
  Administered 2024-08-18: 1 [IU] via SUBCUTANEOUS
  Filled 2024-08-17: qty 1

## 2024-08-17 MED ORDER — ENOXAPARIN SODIUM 60 MG/0.6ML IJ SOSY
0.5000 mg/kg | PREFILLED_SYRINGE | INTRAMUSCULAR | Status: DC
  Administered 2024-08-17 – 2024-08-18 (×2): 55 mg via SUBCUTANEOUS
  Filled 2024-08-17 (×2): qty 0.6

## 2024-08-17 MED ORDER — ACETAMINOPHEN 325 MG PO TABS
650.0000 mg | ORAL_TABLET | Freq: Four times a day (QID) | ORAL | Status: DC | PRN
  Administered 2024-08-18: 650 mg via ORAL
  Filled 2024-08-17: qty 2

## 2024-08-17 MED ORDER — HYDRALAZINE HCL 20 MG/ML IJ SOLN: 5.0000 mg | INTRAMUSCULAR | Status: DC | PRN

## 2024-08-17 NOTE — ED Provider Notes (Signed)
 [facility] [place] Provider Note    Event Date/Time   First MD Initiated Contact with Patient 08/17/24 2154     (approximate)   History   Hypertension   HPI  Thomas Carlson is a 78 y.o. male who presents to the ED for evaluation of Hypertension   Review of an urgent care visit from earlier today.  Seen for elevated blood pressure.  They perform basic labs and CXR that I review, 2 view CXR with small bilateral pleural effusions, UA with new proteinuria, CBC/BMP generally without acute features.  Patient presents to the ED from this urgent care due to elevated blood pressures, lower extremity edema, shortness of breath and orthopnea over the past week or 2.   Physical Exam   Triage Vital Signs: ED Triage Vitals  Encounter Vitals Group     BP 08/17/24 1953 (!) 214/93     Girls Systolic BP Percentile --      Girls Diastolic BP Percentile --      Boys Systolic BP Percentile --      Boys Diastolic BP Percentile --      Pulse Rate 08/17/24 1953 (!) 56     Resp 08/17/24 1953 19     Temp 08/17/24 1953 98.2 F (36.8 C)     Temp Source 08/17/24 1953 Oral     SpO2 08/17/24 1953 95 %     Weight 08/17/24 1950 244 lb (110.7 kg)     Height 08/17/24 1950 5' 8 (1.727 m)     Head Circumference --      Peak Flow --      Pain Score 08/17/24 1950 0     Pain Loc --      Pain Education --      Exclude from Growth Chart --     Most recent vital signs: Vitals:   08/17/24 2030 08/17/24 2100  BP: (!) 218/79 (!) 207/68  Pulse: (!) 57 (!) 48  Resp: 19 20  Temp:    SpO2: 97% 92%    General: Awake, no distress.  CV:  Good peripheral perfusion.  Resp:  Normal effort.  Abd:  No distention.  MSK:  No deformity noted.  Pitting edema to bilateral lower extremities Neuro:  No focal deficits appreciated. Other:     ED Results / Procedures / Treatments   Labs (all labs ordered are listed, but only abnormal results are displayed) Labs Reviewed  BRAIN NATRIURETIC  PEPTIDE - Abnormal; Notable for the following components:      Result Value   B Natriuretic Peptide 1,134.2 (*)    All other components within normal limits  CBG MONITORING, ED - Abnormal; Notable for the following components:   Glucose-Capillary 121 (*)    All other components within normal limits  TROPONIN I (HIGH SENSITIVITY) - Abnormal; Notable for the following components:   Troponin I (High Sensitivity) 33 (*)    All other components within normal limits  TROPONIN I (HIGH SENSITIVITY) - Abnormal; Notable for the following components:   Troponin I (High Sensitivity) 36 (*)    All other components within normal limits  MAGNESIUM   BASIC METABOLIC PANEL WITH GFR  CBC    EKG Sinus rhythm with a rate of 52 bpm.  Normal axis and intervals without clear signs of acute ischemia.  RADIOLOGY 2 view CXR interpreted by me with bilateral small pleural effusion  Official radiology report(s): DG Chest 2 View Result Date: 08/17/2024 CLINICAL DATA:  Hypertension EXAM: CHEST -  2 VIEW COMPARISON:  11/15/2018 FINDINGS: Frontal and lateral views of the chest demonstrate an unremarkable cardiac silhouette. There are trace bilateral pleural effusions. Streaky consolidation at the left lung base favor subsegmental atelectasis or scarring. No airspace disease or pneumothorax. IMPRESSION: 1. Trace bilateral pleural effusions. 2. Linear left basilar consolidation favoring subsegmental atelectasis or scarring. Electronically Signed   By: Ozell Daring M.D.   On: 08/17/2024 18:18    PROCEDURES and INTERVENTIONS:  .Critical Care  Performed by: Claudene Rover, MD Authorized by: Claudene Rover, MD   Critical care provider statement:    Critical care time (minutes):  30   Critical care time was exclusive of:  Separately billable procedures and treating other patients   Critical care was necessary to treat or prevent imminent or life-threatening deterioration of the following conditions:  Cardiac failure and  circulatory failure   Critical care was time spent personally by me on the following activities:  Development of treatment plan with patient or surrogate, discussions with consultants, evaluation of patient's response to treatment, examination of patient, ordering and review of laboratory studies, ordering and review of radiographic studies, ordering and performing treatments and interventions, pulse oximetry, re-evaluation of patient's condition and review of old charts .1-3 Lead EKG Interpretation  Performed by: Claudene Rover, MD Authorized by: Claudene Rover, MD     Interpretation: normal     ECG rate:  58   ECG rate assessment: normal     Rhythm: sinus bradycardia     Ectopy: none     Conduction: normal     Medications  albuterol  (PROVENTIL ) (2.5 MG/3ML) 0.083% nebulizer solution 3 mL (has no administration in time range)  dextromethorphan-guaiFENesin (MUCINEX DM) 30-600 MG per 12 hr tablet 1 tablet (has no administration in time range)  ondansetron  (ZOFRAN ) injection 4 mg (has no administration in time range)  acetaminophen  (TYLENOL ) tablet 650 mg (has no administration in time range)  insulin  aspart (novoLOG ) injection 0-9 Units (has no administration in time range)  insulin  aspart (novoLOG ) injection 0-5 Units ( Subcutaneous Not Given 08/17/24 2225)  enoxaparin  (LOVENOX ) injection 55 mg (55 mg Subcutaneous Given 08/17/24 2232)  hydrALAZINE (APRESOLINE) injection 10 mg (has no administration in time range)  nitroGLYCERIN (NITRODUR - Dosed in mg/24 hr) patch 0.2 mg (has no administration in time range)  furosemide (LASIX) injection 20 mg (has no administration in time range)  hydrALAZINE (APRESOLINE) injection 20 mg (20 mg Intravenous Given 08/17/24 2208)  furosemide (LASIX) injection 40 mg (40 mg Intravenous Given 08/17/24 2206)  potassium chloride  SA (KLOR-CON  M) CR tablet 40 mEq (40 mEq Oral Given 08/17/24 2233)     IMPRESSION / MDM / ASSESSMENT AND PLAN / ED COURSE  I reviewed  the triage vital signs and the nursing notes.  Differential diagnosis includes, but is not limited to, ACS, PTX, PNA, muscle strain/spasm, PE, dissection, anxiety, pleural effusion  {Patient presents with symptoms of an acute illness or injury that is potentially life-threatening.  Patient presents with signs of hypertensive emergency with elevated blood pressure and signs of endorgan damage with CHF exacerbation and new urinary proteinuria.  Initiate diuresis and IV antihypertensives in the ED.  Review workup from ED and urgent care with elevated BNP and CXR with new effusions.  No distress or indications for BiPAP.  Consult with medicine for admission      FINAL CLINICAL IMPRESSION(S) / ED DIAGNOSES   Final diagnoses:  Hypertensive emergency     Rx / DC Orders   ED Discharge Orders  None        Note:  This document was prepared using Dragon voice recognition software and may include unintentional dictation errors.   Claudene Rover, MD 08/17/24 2251
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
## 2024-08-17 NOTE — Progress Notes (Signed)
 Anticoagulation monitoring(Lovenox ):  78 yo male ordered Lovenox  40 mg Q24h    Filed Weights   08/17/24 1950 08/17/24 2144  Weight: 110.7 kg (244 lb) 109.6 kg (241 lb 10 oz)   BMI 36.7    Lab Results  Component Value Date   CREATININE 1.19 08/17/2024   CREATININE 1.33 03/10/2024   CREATININE 1.79 (H) 12/10/2023   Estimated Creatinine Clearance: 60.4 mL/min (by C-G formula based on SCr of 1.19 mg/dL). Hemoglobin & Hematocrit     Component Value Date/Time   HGB 12.2 (L) 08/17/2024 1737   HGB 14.0 02/03/2024 1323   HGB 13.7 05/19/2014 0000   HGB 13.7 05/19/2014 0000   HCT 35.0 (L) 08/17/2024 1737   HCT 41.7 02/03/2024 1323     Per Protocol for Patient with estCrcl > 30 ml/min and BMI > 30, will transition to Lovenox  55 mg Q24h.

## 2024-08-17 NOTE — Discharge Instructions (Signed)
 You are showing signs of damage from your blood pressure being so elevated.  You have fluid in both of your lungs, new swelling in your legs, new protein and blood in your urine.  You also have returned PVCs on your EKG.  Fortunately your creatinine is stable.  I am transferring you to the emergency department for further evaluation and management.  Please let them know if you start having a headache, chest pain, shortness of breath, or for other concerns.

## 2024-08-17 NOTE — ED Triage Notes (Signed)
 Pt was just seen at [facility] today and his BP 208/74. He was advised to come to UC to be seen. Pt denies any symptoms. He states he has not been sleeping sue to having diarrhea. He does take BP meds. Pt also has a cardiologist appointment tomorrow.
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
## 2024-08-17 NOTE — ED Triage Notes (Addendum)
 Pt sent from UC, pt reports HTN within the last week, pt has hx of same and has been controlled by meds. Pt reports recent swelling in bilateral legs, sob. Pt denies chest pain. CXR at [facility] shows trace bilateral pleural effusions. Labs done also at [facility]

## 2024-08-17 NOTE — ED Notes (Signed)
 This tech answered pts call light. Pt had gotten up and went to bathroom. Pt was hooked back up to cardiac monitor. Pt stated that he would like something to eat and that he hadn't eaten since breakfast. Writer checked pts CBG which resulted to be 121. Sandwich tray and water  provided per diet orders.

## 2024-08-17 NOTE — ED Notes (Signed)
 Patient is being discharged from the Urgent Care and sent to the Emergency Department via POV . Per Dr. Van, patient is in need of higher level of care due to hypertensive urgency. Patient is aware and verbalizes understanding of plan of care.  Vitals:   08/17/24 1631 08/17/24 1722  BP: (!) 214/62 (!) 218/82  Pulse: (!) 52   Resp: 18   Temp: 98.2 F (36.8 C)   SpO2: 97%

## 2024-08-18 ENCOUNTER — Ambulatory Visit: Admitting: Internal Medicine

## 2024-08-18 ENCOUNTER — Telehealth: Payer: Self-pay | Admitting: Internal Medicine

## 2024-08-18 DIAGNOSIS — I161 Hypertensive emergency: Secondary | ICD-10-CM | POA: Diagnosis not present

## 2024-08-18 LAB — BASIC METABOLIC PANEL WITH GFR
Anion gap: 9 (ref 5–15)
Anion gap: 10 (ref 5–15)
Anion gap: 9 (ref 5–15)
BUN: 26 mg/dL — ABNORMAL HIGH (ref 8–23)
BUN: 25 mg/dL — ABNORMAL HIGH (ref 8–23)
BUN: 24 mg/dL — ABNORMAL HIGH (ref 8–23)
CO2: 27 mmol/L (ref 22–32)
CO2: 26 mmol/L (ref 22–32)
CO2: 25 mmol/L (ref 22–32)
Calcium: 8.3 mg/dL — ABNORMAL LOW (ref 8.9–10.3)
Calcium: 8 mg/dL — ABNORMAL LOW (ref 8.9–10.3)
Calcium: 7.6 mg/dL — ABNORMAL LOW (ref 8.9–10.3)
Chloride: 108 mmol/L (ref 98–111)
Chloride: 106 mmol/L (ref 98–111)
Chloride: 108 mmol/L (ref 98–111)
Creatinine, Ser: 1.19 mg/dL (ref 0.61–1.24)
Creatinine, Ser: 1.15 mg/dL (ref 0.61–1.24)
Creatinine, Ser: 1.16 mg/dL (ref 0.61–1.24)
GFR, Estimated: >60 mL/min (ref >60)
GFR, Estimated: >60 mL/min (ref >60)
GFR, Estimated: >60 mL/min (ref >60)
Glucose, Bld: 100 mg/dL — ABNORMAL HIGH (ref 70–99)
Glucose, Bld: 115 mg/dL — ABNORMAL HIGH (ref 70–99)
Glucose, Bld: 121 mg/dL — ABNORMAL HIGH (ref 70–99)
Potassium: 2.8 mmol/L — ABNORMAL LOW (ref 3.5–5.1)
Potassium: 2.8 mmol/L — ABNORMAL LOW (ref 3.5–5.1)
Potassium: 3.2 mmol/L — ABNORMAL LOW (ref 3.5–5.1)
Sodium: 144 mmol/L (ref 135–145)
Sodium: 142 mmol/L (ref 135–145)
Sodium: 142 mmol/L (ref 135–145)

## 2024-08-18 LAB — CBG MONITORING, ED
Glucose-Capillary: 115 mg/dL — ABNORMAL HIGH (ref 70–99)
Glucose-Capillary: 112 mg/dL — ABNORMAL HIGH (ref 70–99)
Glucose-Capillary: 137 mg/dL — ABNORMAL HIGH (ref 70–99)
Glucose-Capillary: 110 mg/dL — ABNORMAL HIGH (ref 70–99)

## 2024-08-18 LAB — CBC
HCT: 38.3 % — ABNORMAL LOW (ref 39.0–52.0)
Hemoglobin: 13 g/dL (ref 13.0–17.0)
MCH: 31.5 pg (ref 26.0–34.0)
MCHC: 33.9 g/dL (ref 30.0–36.0)
MCV: 92.7 fL (ref 80.0–100.0)
Platelets: 298 K/uL (ref 150–400)
RBC: 4.13 MIL/uL — ABNORMAL LOW (ref 4.22–5.81)
RDW: 16.7 % — ABNORMAL HIGH (ref 11.5–15.5)
WBC: 14.1 K/uL — ABNORMAL HIGH (ref 4.0–10.5)
nRBC: 0 % (ref 0.0–0.2)

## 2024-08-18 LAB — MAGNESIUM: Magnesium: 2 mg/dL (ref 1.7–2.4)

## 2024-08-18 MED ORDER — INSULIN GLARGINE-YFGN 100 UNIT/ML ~~LOC~~ SOLN
7.0000 [IU] | Freq: Every day | SUBCUTANEOUS | Status: DC
  Filled 2024-08-18: qty 0.07

## 2024-08-18 MED ORDER — SPIRONOLACTONE 25 MG PO TABS
25.0000 mg | ORAL_TABLET | Freq: Every day | ORAL | Status: DC
  Administered 2024-08-18 – 2024-08-19 (×2): 25 mg via ORAL
  Filled 2024-08-18 (×2): qty 1

## 2024-08-18 MED ORDER — ROSUVASTATIN CALCIUM 20 MG PO TABS
20.0000 mg | ORAL_TABLET | Freq: Every day | ORAL | Status: DC
  Administered 2024-08-18 – 2024-08-19 (×2): 20 mg via ORAL
  Filled 2024-08-18 (×2): qty 1

## 2024-08-18 MED ORDER — FUROSEMIDE 10 MG/ML IJ SOLN
20.0000 mg | Freq: Once | INTRAMUSCULAR | Status: AC
  Administered 2024-08-18: 20 mg via INTRAVENOUS
  Filled 2024-08-18: qty 4

## 2024-08-18 MED ORDER — HYDRALAZINE HCL 50 MG PO TABS
25.0000 mg | ORAL_TABLET | Freq: Three times a day (TID) | ORAL | Status: DC
  Administered 2024-08-18: 25 mg via ORAL
  Filled 2024-08-18: qty 1

## 2024-08-18 MED ORDER — CARVEDILOL 3.125 MG PO TABS
3.1250 mg | ORAL_TABLET | Freq: Two times a day (BID) | ORAL | Status: DC
  Administered 2024-08-18 – 2024-08-19 (×2): 3.125 mg via ORAL
  Filled 2024-08-18 (×2): qty 1

## 2024-08-18 MED ORDER — ATENOLOL 25 MG PO TABS: 12.5000 mg | ORAL_TABLET | Freq: Every day | ORAL | Status: DC

## 2024-08-18 MED ORDER — MORPHINE SULFATE (PF) 2 MG/ML IV SOLN
2.0000 mg | INTRAVENOUS | Status: DC | PRN
  Administered 2024-08-18: 2 mg via INTRAVENOUS
  Filled 2024-08-18: qty 1

## 2024-08-18 MED ORDER — ALLOPURINOL 100 MG PO TABS
100.0000 mg | ORAL_TABLET | Freq: Two times a day (BID) | ORAL | Status: DC
  Administered 2024-08-18 – 2024-08-19 (×3): 100 mg via ORAL
  Filled 2024-08-18 (×3): qty 1

## 2024-08-18 MED ORDER — CARVEDILOL 3.125 MG PO TABS
3.1250 mg | ORAL_TABLET | Freq: Two times a day (BID) | ORAL | Status: DC
  Administered 2024-08-18: 3.125 mg via ORAL
  Filled 2024-08-18: qty 1

## 2024-08-18 MED ORDER — ACETAMINOPHEN 325 MG PO TABS
650.0000 mg | ORAL_TABLET | ORAL | Status: DC | PRN
  Administered 2024-08-18 (×2): 650 mg via ORAL
  Filled 2024-08-18 (×2): qty 2

## 2024-08-18 MED ORDER — HYDRALAZINE HCL 50 MG PO TABS
25.0000 mg | ORAL_TABLET | Freq: Three times a day (TID) | ORAL | Status: DC | PRN
Start: 2024-08-18 — End: 2024-08-19

## 2024-08-18 MED ORDER — ACYCLOVIR 200 MG PO CAPS
400.0000 mg | ORAL_CAPSULE | Freq: Every day | ORAL | Status: DC
  Administered 2024-08-18 – 2024-08-19 (×2): 400 mg via ORAL
  Filled 2024-08-18 (×2): qty 2

## 2024-08-18 MED ORDER — LOSARTAN POTASSIUM 50 MG PO TABS: 50.0000 mg | ORAL_TABLET | Freq: Every day | ORAL | Status: DC

## 2024-08-18 MED ORDER — POTASSIUM CHLORIDE CRYS ER 20 MEQ PO TBCR
40.0000 meq | EXTENDED_RELEASE_TABLET | ORAL | Status: AC
  Administered 2024-08-18 (×3): 40 meq via ORAL
  Filled 2024-08-18 (×3): qty 2

## 2024-08-18 MED ORDER — LOSARTAN POTASSIUM 50 MG PO TABS
100.0000 mg | ORAL_TABLET | Freq: Every day | ORAL | Status: DC
  Administered 2024-08-18 – 2024-08-19 (×2): 100 mg via ORAL
  Filled 2024-08-18 (×2): qty 2

## 2024-08-18 MED ORDER — POTASSIUM CHLORIDE CRYS ER 20 MEQ PO TBCR
40.0000 meq | EXTENDED_RELEASE_TABLET | ORAL | Status: AC
  Administered 2024-08-18 (×2): 40 meq via ORAL
  Filled 2024-08-18 (×2): qty 2

## 2024-08-18 MED ORDER — BUDESONIDE 3 MG PO CPEP
3.0000 mg | ORAL_CAPSULE | Freq: Every day | ORAL | Status: DC
  Administered 2024-08-18 – 2024-08-19 (×2): 3 mg via ORAL
  Filled 2024-08-18 (×2): qty 1

## 2024-08-18 MED ORDER — TAMSULOSIN HCL 0.4 MG PO CAPS
0.4000 mg | ORAL_CAPSULE | Freq: Every day | ORAL | Status: DC
  Administered 2024-08-18 – 2024-08-19 (×2): 0.4 mg via ORAL
  Filled 2024-08-18 (×2): qty 1

## 2024-08-18 MED ORDER — CARVEDILOL 6.25 MG PO TABS: 6.2500 mg | ORAL_TABLET | Freq: Two times a day (BID) | ORAL | Status: DC

## 2024-08-18 MED ORDER — ASPIRIN 81 MG PO TBEC
81.0000 mg | DELAYED_RELEASE_TABLET | Freq: Every day | ORAL | Status: DC
  Administered 2024-08-18 – 2024-08-19 (×2): 81 mg via ORAL
  Filled 2024-08-18 (×2): qty 1

## 2024-08-18 MED ORDER — INSULIN GLARGINE-YFGN 100 UNIT/ML ~~LOC~~ SOLN
10.0000 [IU] | Freq: Every day | SUBCUTANEOUS | Status: DC
  Administered 2024-08-18 – 2024-08-19 (×2): 10 [IU] via SUBCUTANEOUS
  Filled 2024-08-18 (×2): qty 0.1

## 2024-08-18 NOTE — Consult Note (Signed)
 PHARMACY CONSULT NOTE - ELECTROLYTES  Pharmacy Consult for Electrolyte Monitoring and Replacement   Recent Labs: Potassium (mmol/L)  Date Value  08/18/2024 2.8 (L)  05/19/2014 4.3   Magnesium  (mg/dL)  Date Value  89/77/7974 2.0   Calcium  (mg/dL)  Date Value  89/77/7974 8.0 (L)  07/21/2013 8.9   Albumin (g/dL)  Date Value  94/85/7974 4.3  02/03/2024 4.1   Phosphorus (mg/dL)  Date Value  94/85/7974 2.4   Sodium (mmol/L)  Date Value  08/18/2024 142  09/09/2023 140   Corrected Ca: N/A, no albumin  Height: 5' 8 (172.7 cm) Weight: 109.6 kg (241 lb 10 oz) IBW/kg (Calculated) : 68.4 Estimated Creatinine Clearance: 62.5 mL/min (by C-G formula based on SCr of 1.15 mg/dL).  Assessment  Thomas Carlson is a 78 y.o. male presenting with hypertensive emergency. PMH significant for dCHF, HTN, HLD, DM, CAD, gout, BPH, CKD-3, lymphocytic colitis, chronic diarrhea, seizure after craniotomy 1960 . Pharmacy has been consulted to monitor and replace electrolytes.  Diet: Heart healthy / carb modified MIVF: N/A Pertinent medications: Received Lasix 40 mg IV x 1 on 10/21. No diuretics so far 10/22  Goal of Therapy: Electrolytes within normal limits  Plan:  K 2.8, patient is ordered 200 mEq total PO potassium for the day. Defer any further replacement All other electrolytes within acceptable range Follow-up labs tomorrow  Thank you for allowing pharmacy to be a part of this patient's care.  Marolyn KATHEE Mare 08/18/2024 2:06 PM

## 2024-08-18 NOTE — ED Notes (Signed)
 Messaged provider regarding patient's potassium level and order for lasix. Also notified provider regarding patient's ringing in ears and headache.

## 2024-08-18 NOTE — Telephone Encounter (Signed)
 Patient is in the hospital right now for high bp and fluid around his lung. Want to make Dr. Mady aware of it. Please advise

## 2024-08-18 NOTE — Telephone Encounter (Signed)
 Thank you for the update.  Our inpatient cardiology team is available to assist, if felt to be appropriate by the hospitalist team.  Lonni Hanson, MD [facility]

## 2024-08-18 NOTE — ED Notes (Signed)
 Pt called out because of the beeping on the monitor and letting me know that the tylenol  wasn't really working and needed something stronger. This tech made pt's RN aware.

## 2024-08-18 NOTE — ED Notes (Signed)
Pt ambulatory to the restroom without difficulty at this time.

## 2024-08-18 NOTE — Progress Notes (Signed)
 Progress Note   Patient: Thomas Carlson FMW:[id number] DOB: 09-Mar-1946 DOA: 08/17/2024     1 DOS: the patient was seen and examined on 08/18/2024   Brief hospital course: HPI: Thomas Carlson is a 78 y.o. male with medical history significant of dCHF, HTN, HLD, DM, CAD, gout, BPH, CKD-3, lymphocytic colitis, chronic diarrhea, seizure after craniotomy 1960, who presents with shortness of breath, leg edema, elevated blood pressure.   He has chronic diarrhea and visited Dr. Unk of GI today.  Patient was found to have elevated blood pressure 208/74 at [facility] appointment. He is compliant with his atenolol  and losartan  that he takes once daily. Pt was then sent UC. CXR showed trace bilateral pleural effusion. UA showed proteinuria, moderate hematuria. Pt was then sent to ED for further evaluation and treatment.   Patient states that he has shortness of breath in the past several days, no chest pain, cough.  No fever or chills.  He also has bilateral leg edema.  Denies nausea vomiting or abdominal pain.  No symptoms of UTI.     Data reviewed independently and ED Course: pt was found to have BNP 1134, WBC 11.4, GFR> 60, potassium 3.2, troponin 33, temperature normal, blood pressure 218/82, bradycardia with heart rate 48-57, RR 20, oxygen saturation 97% on room air.  Patient is admitted to PCU as inpatient.     EKG: I have personally reviewed.  Sinus rhythm, QTc 425, heart rate 52, nonspecific T wave change.    Assessment and Plan: Hypertensive emergency:  Had BP up to 200s systolic. He was volume overloaded on exam and had dyspnea.  He was started on nitroglycerin patch and his home medications were resumed.    His blood pressures improved to the 150s sytolic.   - Will remove his glycerin patch - Increase his losartan  to 100mg  daily.  - Change his atenolol  to Coreg 3.125 mg twice daily -Start low dose spironolactone given history of HFpEF  -Start as needed hydralazine 25 mgTID PRN for SBP  >180 -Will continue to monitor blood pressures   Hypokalemia IN the setting of urinary losses from diuresis.  -Replace -Start spironolactone -Will redose lasix once potassium improved, given patient's symptoms are minimal   Acute on chronic diastolic CHF TTE 12/7972 LVEF 55 to 60%, no RWMA, G1DD, mild MR, mild AR, mod-severe AS. [name] BLE edema and elevated BNP.  S/p IV lasix 40mg  in the ED with improvement in symptoms.  Reportedly cannot tolerate lasix well with hypotension, hypokalemia, and hypomagnesemia.   Symptoms are improving. -Start low dose spironolactone  -Hold lasix this AM due to hypokalemia -Will check BMP and redose this PM   Bradycardia noted on admission. Atenolol  dose was decreased from 25mg  to 12.5mg  daily.  Improved thsi AM.   BPH Continue home flomax   CKD 3a Appears close to basline.  CTM  Gout  Allopurinol   CAD Non ischemic myocardial injury  Trop 35-36, due to demand ischemia in setting of elevated blood pressure and decompensated heart failure. Continue ASA crestor   H/o Lymphocytic colitis and chornic diarrhea  Follows with Dr. Unk  with GI  Continue home budesonide    HTN Per above  T2DM HLD Conitnue home long acting insulin  10u + SSI. Continue crestor    Obesity (BMI 30-39.9): Patient has Obesity Class II, with body weight 109.6 Kg and BMI 36.74  kg/m2.  - Encourage losing weight - Exercise and healthy diet    Subjective: Feels sob is improving.  Continues to have some lower  extremity edema.  Physical Exam: Vitals:   08/18/24 1030 08/18/24 1100 08/18/24 1157 08/18/24 1200  BP: (!) 154/61 (!) 163/89 (!) 188/74 (!) 169/79  Pulse: 63 65  63  Resp: 15 15  17   Temp:      TempSrc:      SpO2: 97% 95%  96%  Weight:      Height:       Physical Exam  Constitutional: In no distress.  Cardiovascular: Normal rate, regular rhythm. 2+ bilateral lower extremity edema  Pulmonary: Non labored breathing on room air, no wheezing or rales.    Abdominal: Soft. Non distended and non tender Musculoskeletal: Normal range of motion.     Neurological: Alert and oriented to person, place, and time. Non focal  Skin: Skin is warm and dry.   Data Reviewed:     Latest Ref Rng & Units 08/18/2024   11:52 AM 08/18/2024    5:32 AM 08/17/2024    5:37 PM  BMP  Glucose 70 - 99 mg/dL 884  899  860   BUN 8 - 23 mg/dL 25  26  30    Creatinine 0.61 - 1.24 mg/dL 8.84  8.80  8.80   Sodium 135 - 145 mmol/L 142  144  142   Potassium 3.5 - 5.1 mmol/L 2.8  2.8  3.2   Chloride 98 - 111 mmol/L 106  108  108   CO2 22 - 32 mmol/L 26  27  26    Calcium  8.9 - 10.3 mg/dL 8.0  8.3  8.4       Latest Ref Rng & Units 08/18/2024    5:32 AM 08/17/2024    5:37 PM 03/10/2024    2:54 PM  CBC  WBC 4.0 - 10.5 K/uL 14.1  11.4  7.2   Hemoglobin 13.0 - 17.0 g/dL 86.9  87.7  85.9   Hematocrit 39.0 - 52.0 % 38.3  35.0  41.3   Platelets 150 - 400 K/uL 298  266  158.0      Family Communication: Spouse at bedside   Disposition: Status is: Inpatient Remains inpatient appropriate because: Treatment of his blood pressure.   Planned Discharge Destination: Home    Time spent: 35 minutes  Author: Alban Pepper, MD 08/18/2024 1:51 PM  For on call review [email or web address].

## 2024-08-18 NOTE — Hospital Course (Signed)
 Previously on chlorhalidone and atenolol  change to atenolol  only because nromal blood pressures. Continue on losartan .    Atenolol  had stent put in in 2004.    About a week swelling feetin thought it was gout, big toe start to turn red.   SOB after exertion. No orthopnea.   3-4 days ago was trying to get a full breath but couldn't get it   Mod severe AS  Chronic diarrhea  since 2019. Budesonide  2 years ago . Taking this for a week. 3 pills in the AM every morning.

## 2024-08-18 NOTE — ED Notes (Signed)
 Called lab to add-on magnesium level

## 2024-08-18 NOTE — ED Notes (Signed)
 Nitroglycerin patch removed per VO of [place], MD

## 2024-08-19 ENCOUNTER — Telehealth (HOSPITAL_COMMUNITY): Payer: Self-pay | Admitting: Pharmacy Technician

## 2024-08-19 ENCOUNTER — Other Ambulatory Visit (HOSPITAL_COMMUNITY): Payer: Self-pay

## 2024-08-19 ENCOUNTER — Encounter: Payer: Self-pay | Admitting: Internal Medicine

## 2024-08-19 ENCOUNTER — Other Ambulatory Visit: Payer: Self-pay

## 2024-08-19 DIAGNOSIS — I161 Hypertensive emergency: Secondary | ICD-10-CM | POA: Diagnosis not present

## 2024-08-19 DIAGNOSIS — I2510 Atherosclerotic heart disease of native coronary artery without angina pectoris: Secondary | ICD-10-CM

## 2024-08-19 DIAGNOSIS — I5033 Acute on chronic diastolic (congestive) heart failure: Secondary | ICD-10-CM | POA: Diagnosis not present

## 2024-08-19 DIAGNOSIS — I10 Essential (primary) hypertension: Secondary | ICD-10-CM

## 2024-08-19 LAB — CBC WITH DIFFERENTIAL/PLATELET
Abs Immature Granulocytes: 0.05 K/uL (ref 0.00–0.07)
Basophils Absolute: 0 K/uL (ref 0.0–0.1)
Basophils Relative: 0 %
Eosinophils Absolute: 0.2 K/uL (ref 0.0–0.5)
Eosinophils Relative: 2 %
HCT: 35.4 % — ABNORMAL LOW (ref 39.0–52.0)
Hemoglobin: 11.8 g/dL — ABNORMAL LOW (ref 13.0–17.0)
Immature Granulocytes: 1 %
Lymphocytes Relative: 19 %
Lymphs Abs: 1.6 K/uL (ref 0.7–4.0)
MCH: 31.3 pg (ref 26.0–34.0)
MCHC: 33.3 g/dL (ref 30.0–36.0)
MCV: 93.9 fL (ref 80.0–100.0)
Monocytes Absolute: 0.8 K/uL (ref 0.1–1.0)
Monocytes Relative: 9 %
Neutro Abs: 6.1 K/uL (ref 1.7–7.7)
Neutrophils Relative %: 69 %
Platelets: 230 K/uL (ref 150–400)
RBC: 3.77 MIL/uL — ABNORMAL LOW (ref 4.22–5.81)
RDW: 16.9 % — ABNORMAL HIGH (ref 11.5–15.5)
WBC: 8.8 K/uL (ref 4.0–10.5)
nRBC: 0 % (ref 0.0–0.2)

## 2024-08-19 LAB — BASIC METABOLIC PANEL WITH GFR
Anion gap: 11 (ref 5–15)
BUN: 21 mg/dL (ref 8–23)
CO2: 25 mmol/L (ref 22–32)
Calcium: 7.2 mg/dL — ABNORMAL LOW (ref 8.9–10.3)
Chloride: 106 mmol/L (ref 98–111)
Creatinine, Ser: 1.16 mg/dL (ref 0.61–1.24)
GFR, Estimated: >60 mL/min (ref >60)
Glucose, Bld: 96 mg/dL (ref 70–99)
Potassium: 3.5 mmol/L (ref 3.5–5.1)
Sodium: 142 mmol/L (ref 135–145)

## 2024-08-19 LAB — MAGNESIUM: Magnesium: 1.9 mg/dL (ref 1.7–2.4)

## 2024-08-19 LAB — CBG MONITORING, ED: Glucose-Capillary: 92 mg/dL (ref 70–99)

## 2024-08-19 MED ORDER — HYDRALAZINE HCL 10 MG PO TABS
10.0000 mg | ORAL_TABLET | Freq: Three times a day (TID) | ORAL | 0 refills | Status: DC
  Filled 2024-08-19: qty 90, 30d supply, fill #0

## 2024-08-19 MED ORDER — LOSARTAN POTASSIUM 100 MG PO TABS
100.0000 mg | ORAL_TABLET | Freq: Every day | ORAL | 0 refills | Status: DC
  Filled 2024-08-19: qty 30, 30d supply, fill #0

## 2024-08-19 MED ORDER — POTASSIUM CHLORIDE CRYS ER 20 MEQ PO TBCR
40.0000 meq | EXTENDED_RELEASE_TABLET | Freq: Once | ORAL | Status: AC
  Administered 2024-08-19: 40 meq via ORAL
  Filled 2024-08-19: qty 2

## 2024-08-19 MED ORDER — HYDROCHLOROTHIAZIDE 12.5 MG PO CAPS
12.5000 mg | ORAL_CAPSULE | Freq: Every day | ORAL | 0 refills | Status: DC
  Filled 2024-08-19: qty 30, 30d supply, fill #0

## 2024-08-19 NOTE — Progress Notes (Signed)
 Heart Failure Navigator Progress Note  Assessed for Heart & Vascular TOC clinic readiness.  Patient goes to [facility] Cardiology and sees Dr. Mady. He had a previously scheduled appointment with [facility] Versa Hila) on 08/23/24 @ 8:25. Patient prefers to attend that appointment instead of a TOC with AHF Clinic.  Provided Heart Failure Education with patient prior to his discharge from [facility] ED.  He understood all information presented.  Education Assessment and Provision:  Detailed education and instructions provided on heart failure disease management including the following:  Signs and symptoms of Heart Failure When to call the physician Importance of daily weights-patient has not routinely been doing daily weights.  Encourage him to start performing those. Low sodium diet Fluid restriction-Patient currently drinks about 80 oz of fluid daily.  Encouraged him to decrease that to 64 oz per day. Medication management-patient says he does take his medications Anticipated future follow-up appointments-he prefers to keep his schedule appt on 08/23/24 @ [facility] Cardiology and not be scheduled a TOC with the Heart Failure Clinic at this time.  Patient education given on each of the above topics.  Patient acknowledges understanding via teach back method and acceptance of all instructions.  Education Materials:  Living Better With Heart Failure Booklet, HF zone tool, & Daily Weight Tracker Tool.  Patient has scale at home: Yes Patient has pill box at home: Yes    Navigator will sign off at this time.  Charmaine Pines, RN, BSN [facility] Heart Failure Navigator Secure Chat Only

## 2024-08-19 NOTE — ED Notes (Signed)
 Called pharmacy about home med delivery to room. ETA 0900.

## 2024-08-19 NOTE — ED Notes (Signed)
 MD at bedside.

## 2024-08-19 NOTE — ED Notes (Signed)
 Arranged for patient to go to the discharge lounge after home med delivery

## 2024-08-19 NOTE — Consult Note (Signed)
 PHARMACY CONSULT NOTE - ELECTROLYTES  Pharmacy Consult for Electrolyte Monitoring and Replacement   Recent Labs: Potassium (mmol/L)  Date Value  08/19/2024 3.5  05/19/2014 4.3   Magnesium  (mg/dL)  Date Value  89/76/7974 1.9   Calcium  (mg/dL)  Date Value  89/76/7974 7.2 (L)  07/21/2013 8.9   Albumin (g/dL)  Date Value  94/85/7974 4.3  02/03/2024 4.1   Phosphorus (mg/dL)  Date Value  94/85/7974 2.4   Sodium (mmol/L)  Date Value  08/19/2024 142  09/09/2023 140   Corrected Ca: N/A, no recent albumin  Height: 5' 8 (172.7 cm) Weight: 109.6 kg (241 lb 10 oz) IBW/kg (Calculated) : 68.4 Estimated Creatinine Clearance: 62 mL/min (by C-G formula based on SCr of 1.16 mg/dL).  Assessment  Thomas Carlson is a 78 y.o. male presenting with hypertensive emergency. PMH significant for dCHF, HTN, HLD, DM, CAD, gout, BPH, CKD-3, lymphocytic colitis, chronic diarrhea, seizure after craniotomy 1960 . Pharmacy has been consulted to monitor and replace electrolytes.  Diet: Heart healthy / carb modified MIVF: N/A Pertinent medications: Received Lasix 20 mg IV x 1 on 10/22  Goal of Therapy: Electrolytes within normal limits  Plan:  40 mEq po KCl x 1 All other electrolytes within acceptable range Follow-up labs tomorrow  Thank you for allowing pharmacy to be a part of this patient's care.  Adriana JONETTA Bolster 08/19/2024 7:40 AM

## 2024-08-19 NOTE — Telephone Encounter (Signed)
 Patient Product/process development scientist completed.    The patient is insured through [employer]. Patient has Medicare and is not eligible for a copay card, but may be able to apply for patient assistance or Medicare RX Payment Plan (Patient Must reach out to their plan, if eligible for payment plan), if available.    Ran test claim for sacubitril-valsartan 24-26 mg and the current 30 day co-pay is $47.00.  Ran test claim for Jardiance 10 mg and the current 30 day co-pay is $47.00.  Ran test claim for Farxiga 10 mg and the current 30 day co-pay is $253.82.  This test claim was processed through [facility]- copay amounts may vary at other pharmacies due to pharmacy/plan contracts, or as the patient moves through the different stages of their insurance plan.     Reyes Sharps, CPHT Pharmacy Technician Patient Advocate Specialist Lead [facility] Patient Advocate Team Direct Number: [phone]  Fax: [phone]

## 2024-08-19 NOTE — Progress Notes (Signed)
 Heart Failure Stewardship Pharmacy Note  PCP: [name] Baller, MD PCP-Cardiologist: Lonni Hanson, MD  HPI: Thomas Carlson is a 78 y.o. male with dCHF, HTN, HLD, DM, CAD, gout, BPH, CKD-3, lymphocytic colitis, chronic diarrhea, seizure after craniotomy 1960 who presented with shortness of breath and lower extremity edema. On admission, BNP was 1134.2, HS-troponin was 33. Chest x-ray noted trace bilateral pleural effusions.  Pertinent cardiac history: TTE 08/2016 with LVEf of 55%. TTE 09/2022 with LVEF 50-55%, normal wall motion, mild LVH, G1DD, mildly dilated LA, mild MR, severe AV calcification with mild AR, moderate AS. TTE 03/2024 with LVEF 55-60%, no RWMA, G1DD, mild MR, mild AR, mod-severe AS.  Pertinent Lab Values: Creat  Date Value Ref Range Status  05/19/2014 1.50  Final   Creatinine, Ser  Date Value Ref Range Status  08/19/2024 1.16 0.61 - 1.24 mg/dL Final   BUN  Date Value Ref Range Status  08/19/2024 21 8 - 23 mg/dL Final  88/87/7975 18 8 - 27 mg/dL Final   Potassium  Date Value Ref Range Status  08/19/2024 3.5 3.5 - 5.1 mmol/L Final  05/19/2014 4.3 mmol/L Final   Sodium  Date Value Ref Range Status  08/19/2024 142 135 - 145 mmol/L Final  09/09/2023 140 134 - 144 mmol/L Final   B Natriuretic Peptide  Date Value Ref Range Status  08/17/2024 1,134.2 (H) 0.0 - 100.0 pg/mL Final    Comment:    Performed at [facility], [address] [place], [place] [address]   Magnesium   Date Value Ref Range Status  08/19/2024 1.9 1.7 - 2.4 mg/dL Final    Comment:    Performed at [facility], [address] [place], [place] [address]   Hgb A1c MFr Bld  Date Value Ref Range Status  03/10/2024 6.0 4.6 - 6.5 % Final    Comment:    Glycemic Control Guidelines for People with Diabetes:Non Diabetic:  <6%Goal of Therapy: <7%Additional Action Suggested:  >8%    TSH  Date Value Ref Range Status  11/03/2023 2.98 0.35 - 5.50 uIU/mL Final   LDH   Date Value Ref Range Status  08/25/2018 208 (H) 98 - 192 U/L Final    Comment:    Performed at [facility], [address] [place], [place] [address]    Vital Signs:  Temp:  [97.6 F (36.4 C)-98.5 F (36.9 C)] 98.5 F (36.9 C) (10/23 0507) Pulse Rate:  [52-97] 83 (10/23 0700) Cardiac Rhythm: Normal sinus rhythm (10/22 2116) Resp:  [15-26] 24 (10/23 0700) BP: (141-188)/(42-130) 161/81 (10/23 0700) SpO2:  [92 %-100 %] 95 % (10/23 0700) No intake or output data in the 24 hours ending 08/19/24 0727  Current Heart Failure Medications:  Loop diuretic: none Beta-Blocker: carvedilol 3.125 mg BID ACEI/ARB/ARNI: losartan  100 mg daily MRA: spironolactone SGLT2i: none Other: none  Prior to admission Heart Failure Medications:  Loop diuretic: none Beta-Blocker: atenolol  25 mg daily ACEI/ARB/ARNI: losartan  50 mg daily MRA: none SGLT2i: none Other: none  Assessment: 1. Acute on chronic diastolic heart failure (LVEF 55-60%) with G1DD, due to NICM. NYHA class II symptoms.  -Symptoms: Patient reports feeling close to baseline. LEE is lessened.  -Volume: Appears to be relatively euvolemic, may have some additional volume that can be resolved with new spironolactone and hydrochlorothiazide. -Hemodynamics: BP remains elevated. HR 70-80s. -BB: Continue carvedilol 3.125 mg BID. Would optimize with ARNI prior to BB titration given marginally better benefit to hospitalization.  -ACEI/ARB/ARNI: Consider transitioning from losartan  to Entresto  49-51 mg BID. Modest benefit in HFpEF, but does increase BP control and diuresis. -MRA: Continue spironolactone 25 mg daily -SGLT2i: Consider adding Jardiance 10 mg daily. Discharge orders already entered, so can consider in clinic. Also has DM.  Plan: 1) Medication changes recommended at this time: -Consider transitioning to Entresto 49-51 mg BID in clinic. Can likely replace hydralazine and losartan .  -Consider adding Jardiance 10 mg daily  in clinic  2) Patient assistance: -Copays for Entresto and Jardiance are $47 -Copay for Doreen is $253.82  3) Education: - Patient has been educated on current HF medications and potential additions to HF medication regimen - Patient verbalizes understanding that over the next few months, these medication doses may change and more medications may be added to optimize HF regimen - Patient has been educated on basic disease state pathophysiology and goals of therapy  Medication Assistance / Insurance Benefits Check: Does the patient have prescription insurance?    Please do not hesitate to reach out with questions or concerns,  Jaun Bash, PharmD, CPP, BCPS, [facility] Heart Failure Pharmacist  Phone - [phone] 08/19/2024 9:30 AM

## 2024-08-19 NOTE — ED Notes (Signed)
Pt verbalizes understanding of discharge instructions. Opportunity for questioning and answers were provided. Pt discharged from ED to home with wife.    

## 2024-08-20 ENCOUNTER — Telehealth: Payer: Self-pay

## 2024-08-20 NOTE — Patient Instructions (Signed)
 Visit Information  Thank you for taking time to visit with me today. Please don't hesitate to contact me if I can be of assistance to you before our next scheduled telephone appointment.  Our next appointment is by telephone on Friday October 31st at 1:15pm  Following is a copy of your care plan:   Goals Addressed             This Visit's Progress    VBCI Transitions of Care (TOC) Care Plan       Problems:  Recent Hospitalization for treatment of HTN Knowledge Deficit Related to Medications for Hypertension  Goal:  Over the next 30 days, the patient will not experience hospital readmission  Interventions:   Hypertension Interventions: Last practice recorded BP readings:  BP Readings from Last 3 Encounters:  08/19/24 132/69  08/17/24 (!) 218/82  05/19/24 128/68   Most recent eGFR/CrCl:  Lab Results  Component Value Date   EGFR 46 (L) 09/09/2023    No components found for: CRCL  Evaluation of current treatment plan related to hypertension self management and patient's adherence to plan as established by provider Provided education to patient re: stroke prevention, s/s of heart attack and stroke Reviewed medications with patient and discussed importance of compliance Reviewed scheduled/upcoming provider appointments including:   Patient Self Care Activities:  Attend all scheduled provider appointments Call pharmacy for medication refills 3-7 days in advance of running out of medications Call provider office for new concerns or questions  Notify RN Care Manager of [facility] call rescheduling needs Participate in Transition of Care Program/Attend [facility] scheduled calls Take medications as prescribed    Plan:  Telephone follow up appointment with care management team member scheduled for:  Friday October 31st at 1:15pm        Patient verbalizes understanding of instructions and care plan provided today and agrees to view in MyChart. Active MyChart status and patient  understanding of how to access instructions and care plan via MyChart confirmed with patient.     The patient has been provided with contact information for the care management team and has been advised to call with any health related questions or concerns.   Please call the care guide team at [phone] if you need to cancel or reschedule your appointment.   Please call the Suicide and Crisis Lifeline: 988 call the [place]  National Suicide Prevention Lifeline: (470)201-2825 or TTY: 262-353-6063 TTY (559) 233-1109) to talk to a trained counselor if you are experiencing a Mental Health or Behavioral Health Crisis or need someone to talk to.  Medford Balboa, BSN, RN [place]  VBCI - [employer] [facility] RN Care Manager [phone]

## 2024-08-20 NOTE — Transitions of Care (Post Inpatient/ED Visit) (Signed)
 08/20/2024  Name: Thomas Carlson MRN: [id number] DOB: [birth year]  Today's TOC FU Call Status: Today's TOC FU Call Status:: Successful TOC FU Call Completed Unsuccessful Call (1st Attempt) Date: 08/20/24 [facility] FU Call Complete Date: 08/20/24 Patient's Name and Date of Birth confirmed.  Transition Care Management Follow-up Telephone Call Date of Discharge: 08/19/24 Discharge Facility: [facility]) Type of Discharge: Inpatient Admission Primary Inpatient Discharge Diagnosis:: Hypertensive emergency How have you been since you were released from the hospital?: Better Any questions or concerns?: No  Items Reviewed: Did you receive and understand the discharge instructions provided?: Yes Medications obtained,verified, and reconciled?: Yes (Medications Reviewed) Any new allergies since your discharge?: No Dietary orders reviewed?: Yes Type of Diet Ordered:: Low sodium Heart Healthy Do you have support at home?: Yes People in Home [RPT]: spouse Name of Support/Comfort Primary Source: Deantre Bourdon  Medications Reviewed Today: Medications Reviewed Today     Reviewed by Moises Reusing, RN (Case Manager) on 08/20/24 at 1500  Med List Status: <None>   Medication Order Taking? Sig Documenting Provider Last Dose Status Informant  Accu-Chek Softclix Lancets lancets [id number]  Use as instructed to check blood sugar once a day [name] Baller, MD  Active Self  acyclovir  (ZOVIRAX ) 400 MG tablet [id number] No TAKE 1 TABLET EVERY DAY [name] Baller, MD 08/17/2024 Active Self  allopurinol  (ZYLOPRIM ) 100 MG tablet [id number]  Take 1 tablet (100 mg total) by mouth 2 (two) times daily. [name] Baller, MD  Active Self  aspirin  EC 81 MG tablet [id number] No Take 1 tablet (81 mg total) by mouth daily. [name] Baller, MD 08/17/2024 Active Self  atenolol  (TENORMIN ) 25 MG tablet [id number] No Take 1 tablet (25 mg total) by mouth daily. [name] Baller, MD 08/17/2024  Active Self  Biotin  1000 MCG tablet [id number] No Take 1,000 mcg by mouth daily.  [provider] 08/17/2024 Active Self  Blood Glucose Monitoring Suppl (ACCU-CHEK GUIDE ME) w/Device KIT [id number] No Use as instructed to check blood sugar once a day [name] Baller, MD 08/17/2024 Active Self  Cholecalciferol  (VITAMIN D3) 25 MCG (1000 UT) CAPS [id number] No Take 1 capsule (1,000 Units total) by mouth daily. [name] Baller, MD 08/17/2024 Active Self  Cyanocobalamin  (B-12) 1000 MCG SUBL [id number] No Place 1 tablet under the tongue once a week. [name] Baller, MD 08/17/2024 Active Self  glucose blood (ACCU-CHEK GUIDE TEST) test strip [id number]  Use as instructed to check blood sugar once a day [name] Baller, MD  Active Self  hydrALAZINE (APRESOLINE) 10 MG tablet [id number]  Take 1 tablet (10 mg total) by mouth 3 (three) times daily. Maree Hue, MD  Active   hydrochlorothiazide (MICROZIDE) 12.5 MG capsule [id number]  Take 1 capsule (12.5 mg total) by mouth daily. Maree Hue, MD  Active   insulin  glargine (LANTUS  SOLOSTAR) 100 UNIT/ML Solostar Pen [id number] No Inject 10 Units into the skin daily. [name] Baller, MD 08/17/2024 Active Self  Insulin  Pen Needle (PEN NEEDLES) 31G X 5 MM MISC [id number]  Use as directed to inject insulin  daily [name] Baller, MD  Active Self  losartan  (COZAAR ) 100 MG tablet [id number]  Take 1 tablet (100 mg total) by mouth daily. Maree Hue, MD  Active   Magnesium  500 MG TABS [id number] No Take 500 mg by mouth daily. [provider] 08/17/2024 Active Self  metFORMIN  (GLUCOPHAGE -XR) 500 MG 24 hr tablet [id number] No Take 2 tablets (1,000 mg total) by mouth daily with breakfast. [name] Baller, MD 08/17/2024 Active Self  tamsulosin  (FLOMAX ) 0.4 MG  CAPS capsule [id number] No Take 1 capsule (0.4 mg total) by mouth daily. [name] Baller, MD 08/17/2024 Active Self            Home Care and Equipment/Supplies: Were Home Health Services Ordered?:  NA Any new equipment or medical supplies ordered?: NA  Functional Questionnaire: Do you need assistance with bathing/showering or dressing?: No Do you need assistance with meal preparation?: No Do you need assistance with eating?: No Do you have difficulty maintaining continence: No Do you need assistance with getting out of bed/getting out of a chair/moving?: No Do you have difficulty managing or taking your medications?: No  Follow up appointments reviewed: PCP Follow-up appointment confirmed?: Yes Date of PCP follow-up appointment?: 08/24/24 Follow-up Provider: Dr. [name] Specialist [facility] Follow-up appointment confirmed?: Yes Date of Specialist follow-up appointment?: 08/23/24 Follow-Up Specialty Provider:: Dr. Valeria Do you need transportation to your follow-up appointment?: No Do you understand care options if your condition(s) worsen?: Yes-patient verbalized understanding  SDOH Interventions Today    Flowsheet Row Most Recent Value  SDOH Interventions   Food Insecurity Interventions Intervention Not Indicated  Housing Interventions Intervention Not Indicated  Transportation Interventions Intervention Not Indicated  Utilities Interventions Intervention Not Indicated    Goals Addressed             This Visit's Progress    VBCI Transitions of Care (TOC) Care Plan       Problems:  Recent Hospitalization for treatment of HTN Knowledge Deficit Related to Medications for Hypertension  Goal:  Over the next 30 days, the patient will not experience hospital readmission  Interventions:   Hypertension Interventions: Last practice recorded BP readings:  BP Readings from Last 3 Encounters:  08/19/24 132/69  08/17/24 (!) 218/82  05/19/24 128/68   Most recent eGFR/CrCl:  Lab Results  Component Value Date   EGFR 46 (L) 09/09/2023    No components found for: CRCL  Evaluation of current treatment plan related to hypertension self management and patient's  adherence to plan as established by provider Provided education to patient re: stroke prevention, s/s of heart attack and stroke Reviewed medications with patient and discussed importance of compliance Reviewed scheduled/upcoming provider appointments including:   Patient Self Care Activities:  Attend all scheduled provider appointments Call pharmacy for medication refills 3-7 days in advance of running out of medications Call provider office for new concerns or questions  Notify RN Care Manager of [facility] call rescheduling needs Participate in Transition of Care Program/Attend [facility] scheduled calls Take medications as prescribed    Plan:  Telephone follow up appointment with care management team member scheduled for:  Friday October 31st at 1:15pm        Medford Balboa, BSN, RN [place]  VBCI - [facility] Health RN Care Manager [phone]

## 2024-08-21 NOTE — Discharge Summary (Signed)
 Physician Discharge Summary   Patient: Thomas Carlson MRN: [id number] DOB: [birth year]  Admit date:     08/17/2024  Discharge date: 08/19/2024  Discharge Physician: Cresencio Fairly   PCP: [name] Baller, MD   Recommendations at discharge:   Follow-up with outpatient providers as requested  Discharge Diagnoses: Principal Problem:   Hypertensive emergency Active Problems:   Acute on chronic diastolic CHF (congestive heart failure) (HCC)   CAD (coronary artery disease)   Myocardial injury   Hyperlipidemia associated with type 2 diabetes mellitus (HCC)   Type II diabetes mellitus with renal manifestations (HCC)   Gout   Bradycardia   BPH without obstruction/lower urinary tract symptoms   Chronic kidney disease, stage 3a (HCC)   Lymphocytic colitis   Chronic diarrhea   Obesity (BMI 30-39.9)  Hospital Course: Assessment and Plan:  78 y.o. male with medical history significant of dCHF, HTN, HLD, DM, CAD, gout, BPH, CKD-3, lymphocytic colitis, chronic diarrhea, seizure after craniotomy 1960, who presents with shortness of breath, leg edema, elevated blood pressure.   He has chronic diarrhea and visited Dr. Unk of GI today.  Patient was found to have elevated blood pressure 208/74 at [facility] appointment. He is compliant with his atenolol  and losartan  that he takes once daily. Pt was then sent UC. CXR showed trace bilateral pleural effusion. UA showed proteinuria, moderate hematuria. Pt was then sent to ED for further evaluation and treatment.   Patient states that he has shortness of breath in the past several days, no chest pain, cough.  No fever or chills.  He also has bilateral leg edema.  Denies nausea vomiting or abdominal pain.  No symptoms of UTI.     Data reviewed independently and ED Course: pt was found to have BNP 1134, WBC 11.4, GFR> 60, potassium 3.2, troponin 33, temperature normal, blood pressure 218/82, bradycardia with heart rate 48-57, RR 20, oxygen saturation 97% on  room air.  Patient is admitted to PCU as inpatient.     EKG: I have personally reviewed.  Sinus rhythm, QTc 425, heart rate 52, nonspecific T wave change.     Assessment and Plan: Hypertensive emergency:  Had BP up to 200s systolic on admission. He was volume overloaded on exam and had dyspnea.  He was started on nitroglycerin patch and his home medications were resumed.  Blood pressure improved with treatment   Hypokalemia Repleted and resolved   Acute on chronic diastolic CHF TTE 12/7972 LVEF 55 to 60%, no RWMA, G1DD, mild MR, mild AR, mod-severe AS. [name] BLE edema and elevated BNP.  Diuresed while in the hospital with good response   Bradycardia noted on admission. Atenolol  dose was decreased from 25mg  to 12.5mg  daily.   BPH Continue home flomax    CKD 3a Appears close to basline.    Gout  Allopurinol    CAD Non ischemic myocardial injury  Trop 35-36, due to demand ischemia in setting of elevated blood pressure and decompensated heart failure. Continue ASA crestor    H/o Lymphocytic colitis and chornic diarrhea   Follows with Dr. Unk  with GI   Continue home budesonide     HTN  T2DM HLD   Obesity (BMI 30-39.9): Patient has Obesity Class II, with body weight 109.6 Kg and BMI 36.74  kg/m2.  - Encourage losing weight - Exercise and healthy diet       Disposition: Home Diet recommendation:  Discharge Diet Orders (From admission, onward)     Start     Ordered   08/19/24  0000  Diet - low sodium heart healthy        08/19/24 0819           Carb modified diet DISCHARGE MEDICATION: Allergies as of 08/19/2024       Reactions   Gabapentin Other (See Comments)   unknown   Bactrim [sulfamethoxazole-trimethoprim] Rash   Diffuse drug reaction - maculopapular rash   Penicillins Hives   Has patient had a PCN reaction causing immediate rash, facial/tongue/throat swelling, SOB or lightheadedness with hypotension: Unknown Has patient had a PCN reaction causing  severe rash involving mucus membranes or skin necrosis: Unknown Has patient had a PCN reaction that required hospitalization: Unknown Has patient had a PCN reaction occurring within the last 10 years: Unknown If all of the above answers are NO, then may proceed with Cephalosporin use.   Lasix [furosemide] Other (See Comments)   Drops blood pressure and drained potassium and magnesium    Prevnar 20 [pneumococcal 20-val Conj Vacc] Rash   Large local reaction at injection site        Medication List     STOP taking these medications    budesonide  3 MG 24 hr capsule Commonly known as: ENTOCORT EC    doxycycline  100 MG tablet Commonly known as: VIBRA -TABS   mesalamine  0.375 g 24 hr capsule Commonly known as: Apriso    rosuvastatin  20 MG tablet Commonly known as: CRESTOR        TAKE these medications    Accu-Chek Guide Me w/Device Kit Use as instructed to check blood sugar once a day   Accu-Chek Guide Test test strip Generic drug: glucose blood Use as instructed to check blood sugar once a day   Accu-Chek Softclix Lancets lancets Use as instructed to check blood sugar once a day   acyclovir  400 MG tablet Commonly known as: ZOVIRAX  TAKE 1 TABLET EVERY DAY   allopurinol  100 MG tablet Commonly known as: ZYLOPRIM  Take 1 tablet (100 mg total) by mouth 2 (two) times daily.   aspirin  EC 81 MG tablet Take 1 tablet (81 mg total) by mouth daily.   atenolol  25 MG tablet Commonly known as: TENORMIN  Take 1 tablet (25 mg total) by mouth daily.   B-12 1000 MCG Subl Place 1 tablet under the tongue once a week.   Biotin  1000 MCG tablet Take 1,000 mcg by mouth daily.   hydrALAZINE 10 MG tablet Commonly known as: APRESOLINE Take 1 tablet (10 mg total) by mouth 3 (three) times daily.   hydrochlorothiazide 12.5 MG capsule Commonly known as: MICROZIDE Take 1 capsule (12.5 mg total) by mouth daily.   Lantus  SoloStar 100 UNIT/ML Solostar Pen Generic drug: insulin   glargine Inject 10 Units into the skin daily.   losartan  100 MG tablet Commonly known as: COZAAR  Take 1 tablet (100 mg total) by mouth daily. What changed:  medication strength how much to take   Magnesium  500 MG Tabs Take 500 mg by mouth daily.   metFORMIN  500 MG 24 hr tablet Commonly known as: GLUCOPHAGE -XR Take 2 tablets (1,000 mg total) by mouth daily with breakfast.   Pen Needles 31G X 5 MM Misc Use as directed to inject insulin  daily   tamsulosin  0.4 MG Caps capsule Commonly known as: FLOMAX  Take 1 capsule (0.4 mg total) by mouth daily.   Vitamin D3 25 MCG (1000 UT) Caps Take 1 capsule (1,000 Units total) by mouth daily.        Follow-up Information     [name] Baller, MD. Schedule an appointment as  soon as possible for a visit on 08/24/2024.   Specialty: Family Medicine Why: @ 1 pm as scheduled. [facility] Discharge F/UP Contact information: [address] [place] [address] [phone]         Loistine Sober, NP. Go on 08/23/2024.   Specialty: Cardiology Why: @ 8:25 am as scheduled, [facility] Discharge F/UP Contact information: [address] [place] [address] [phone]                Discharge Exam: Fredricka Weights   08/17/24 1950 08/17/24 2144  Weight: 110.7 kg 109.6 kg   Constitutional: In no distress.  Cardiovascular: Normal rate, regular rhythm. 1+ bilateral lower extremity edema  Pulmonary: Non labored breathing on room air, no wheezing or rales.   Abdominal: Soft. Non distended and non tender Musculoskeletal: Normal range of motion.     Neurological: Alert and oriented to person, place, and time. Non focal  Skin: Skin is warm and dry.   Condition at discharge: fair  The results of significant diagnostics from this hospitalization (including imaging, microbiology, ancillary and laboratory) are listed below for reference.   Imaging Studies: DG Chest 2 View Result Date:  08/17/2024 CLINICAL DATA:  Hypertension EXAM: CHEST - 2 VIEW COMPARISON:  11/15/2018 FINDINGS: Frontal and lateral views of the chest demonstrate an unremarkable cardiac silhouette. There are trace bilateral pleural effusions. Streaky consolidation at the left lung base favor subsegmental atelectasis or scarring. No airspace disease or pneumothorax. IMPRESSION: 1. Trace bilateral pleural effusions. 2. Linear left basilar consolidation favoring subsegmental atelectasis or scarring. Electronically Signed   By: Ozell Daring M.D.   On: 08/17/2024 18:18    Microbiology: Results for orders placed or performed during the hospital encounter of 08/24/19  SARS CORONAVIRUS 2 (TAT 6-24 HRS) Nasopharyngeal Nasopharyngeal Swab     Status: None   Collection Time: 08/24/19  4:32 PM   Specimen: Nasopharyngeal Swab  Result Value Ref Range Status   SARS Coronavirus 2 NEGATIVE NEGATIVE Final    Comment: (NOTE) SARS-CoV-2 target nucleic acids are NOT DETECTED. The SARS-CoV-2 RNA is generally detectable in upper and lower respiratory specimens during the acute phase of infection. Negative results do not preclude SARS-CoV-2 infection, do not rule out co-infections with other pathogens, and should not be used as the sole basis for treatment or other patient management decisions. Negative results must be combined with clinical observations, patient history, and epidemiological information. The expected result is Negative. Fact Sheet for Patients: hairslick.no Fact Sheet for Healthcare Providers: quierodirigir.com This test is not yet approved or cleared by the [place]  FDA and  has been authorized for detection and/or diagnosis of SARS-CoV-2 by FDA under an Emergency Use Authorization (EUA). This EUA will remain  in effect (meaning this test can be used) for the duration of the COVID-19 declaration under Section 56 4(b)(1) of the Act, 21 U.S.C. section  360bbb-3(b)(1), unless the authorization is terminated or revoked sooner. Performed at [facility], [address], [place], [place] [address]   GI pathogen panel by PCR, stool     Status: None   Collection Time: 08/25/19  3:48 AM   Specimen: STOOL  Result Value Ref Range Status   Plesiomonas shigelloides NOT DETECTED NOT DETECTED Final   Yersinia enterocolitica NOT DETECTED NOT DETECTED Final   Vibrio NOT DETECTED NOT DETECTED Final   Enteropathogenic E coli NOT DETECTED NOT DETECTED Final   E coli (ETEC) LT/ST NOT DETECTED NOT DETECTED Final  E coli 0157 by PCR Not applicable NOT DETECTED Final   Cryptosporidium by PCR NOT DETECTED NOT DETECTED Final   Entamoeba histolytica NOT DETECTED NOT DETECTED Final   Adenovirus F 40/41 NOT DETECTED NOT DETECTED Final   Norovirus GI/GII NOT DETECTED NOT DETECTED Final   Sapovirus NOT DETECTED NOT DETECTED Final    Comment: (NOTE) Performed At: [facility] [address] [place], [place] [id number] Jennette Shorter MD Ey:[phone]    Vibrio cholerae NOT DETECTED NOT DETECTED Final   Campylobacter by PCR NOT DETECTED NOT DETECTED Final   Salmonella by PCR NOT DETECTED NOT DETECTED Final   E coli (STEC) NOT DETECTED NOT DETECTED Final   Enteroaggregative E coli NOT DETECTED NOT DETECTED Final   Shigella by PCR NOT DETECTED NOT DETECTED Final   Cyclospora cayetanensis NOT DETECTED NOT DETECTED Final   Astrovirus NOT DETECTED NOT DETECTED Final   G lamblia by PCR NOT DETECTED NOT DETECTED Final   Rotavirus A by PCR NOT DETECTED NOT DETECTED Final  C Difficile Quick Screen w PCR reflex     Status: None   Collection Time: 08/25/19  3:48 AM   Specimen: STOOL  Result Value Ref Range Status   C Diff antigen NEGATIVE NEGATIVE Final   C Diff toxin NEGATIVE NEGATIVE Final   C Diff interpretation No C. difficile detected.  Final    Comment: Performed at [facility], [address] [place], [place] [address]     Labs: CBC: Recent Labs  Lab 08/17/24 1737 08/18/24 0532 08/19/24 0507  WBC 11.4* 14.1* 8.8  NEUTROABS 9.7*  --  6.1  HGB 12.2* 13.0 11.8*  HCT 35.0* 38.3* 35.4*  MCV 90.9 92.7 93.9  PLT 266 298 230   Basic Metabolic Panel: Recent Labs  Lab 08/17/24 1737 08/17/24 1953 08/18/24 0532 08/18/24 1152 08/18/24 2007 08/19/24 0507  NA 142  --  144 142 142 142  K 3.2*  --  2.8* 2.8* 3.2* 3.5  CL 108  --  108 106 108 106  CO2 26  --  27 26 25 25   GLUCOSE 139*  --  100* 115* 121* 96  BUN 30*  --  26* 25* 24* 21  CREATININE 1.19  --  1.19 1.15 1.16 1.16  CALCIUM  8.4*  --  8.3* 8.0* 7.6* 7.2*  MG  --  2.1 2.0  --   --  1.9   Liver Function Tests: No results for input(s): AST, ALT, ALKPHOS, BILITOT, PROT, ALBUMIN in the last 168 hours. CBG: Recent Labs  Lab 08/18/24 0719 08/18/24 1148 08/18/24 1645 08/18/24 2301 08/19/24 0726  GLUCAP 115* 112* 137* 110* 92    Discharge time spent: greater than 30 minutes.  Signed: Cresencio Fairly, MD [facility] 08/21/2024

## 2024-08-21 NOTE — Progress Notes (Unsigned)
 Cardiology Clinic Note   Date: 08/23/2024 ID: Thomas Carlson, DOB [birth year], MRN [id number]  Primary Cardiologist:  Lonni Hanson, MD  Chief Complaint   Thomas Carlson is a 78 y.o. male who presents to the clinic today for hospital follow up.   Patient Profile   Thomas Carlson is followed by Dr. Hanson for the history outlined below.      Past medical history significant for: CAD. LHC 05/03/2004 performed at an outside facility: High-grade stenosis proximal LAD.  PCI with DES 3.5 x 18 mm to proximal LAD. Chronic diastolic heart failure/aortic stenosis. Echo 04/19/2024: EF 55 to 60%.  No RWMA.  Grade I DD.  Normal RV size/function.  Mild MR.  Moderate to severe aortic stenosis, mean gradient 30 mmHg. Hypertension. Hyperlipidemia. Lipid panel 08/11/2024: LDL 47, HDL 48, TG 80, total 111. OSA. T2DM. CKD stage IIIa.  In summary, patient was previously followed by Dr. Bosie at [facility] cardiology.  Per review of past records patient underwent PCI with DES to proximal LAD in July 2005.  Echo November 2017 demonstrated EF 55 to 60%, hypokinesis of basal septal and basal anterior septal myocardium anterior myocardium, normal RV function, normal PA pressure, mild to moderately calcified aortic valve with mild regurgitation.  He was seen by Dr. Bosie in 2019 for preop evaluation prior to orthopedic surgery.  He underwent a functional study which revealed preserved LV function with fixed defect in the inferior wall.  Echo showed mild aortic stenosis with mean gradient 19.8 mmHg.  He had a repeat echo in December 2023 ordered by PCP which demonstrated EF 50 to 55%, no RWMA, mild LVH, Grade I DD, normal RV size/function, mild LAE, mild MR, moderate aortic valve stenosis, mean gradient 22 mmHg.  Patient establish care with Dr. Hanson on 12/12/2022 at the request of Dr. [name].  He reported stable dyspnea when walking uphill x 1 to 2 years.  He had no cardiac complaints.  Repeat echo in June 2025  demonstrated moderate to severe aortic stenosis as detailed above.  Upon follow-up in July 2025 he complained of easy fatigability and stable dyspnea.  Plan for continued monitoring of symptoms and repeat echo in 2026.  Patient was sent to the ED by urgent care on 08/17/2024 for elevated BP of 208/74.  He was being seen at GI for chronic diarrhea and was noted to have elevated blood pressure and sent to urgent care who subsequently recommended ED visit.  Patient also complained of lower extremity edema and dyspnea.  Upon arrival to ED BP 218/82.  He was given hydralazine 20 mg IV and diuresed with 40 mg IV Lasix.  BP improved with SBP in the 180s to 190s.  Initial labs: WBC 11.4, hemoglobin 12.2, sodium 142, potassium 3.2, BUN 30, creatinine 1.19, BNP 1134.2, magnesium  2.1.  Troponin 33>> 36.  Chest x-ray demonstrated trace bilateral pleural effusions, linear left basilar consolidation favoring subsegmental atelectasis or scarring.  Patient was discharged on 08/19/2024 with recommendation to follow-up with advanced heart failure clinic.  Patient preferred keeping previously scheduled visit with general cardiology.      History of Present Illness    Today, patient is accompanied by his wife. He reports feeling improved since hospital discharge. BP is well controlled. He is down 14 lb and lower extremity edema has resolved. He denies chest pain, pressure or tightness. He gets mild dyspnea with walking that he manages by pacing and taking short rest breaks. His main source of exercise is walking  with his dog. Other activity is limited secondary to back pain.      ROS: All other systems reviewed and are otherwise negative except as noted in History of Present Illness.  EKGs/Labs Reviewed    EKG Interpretation Date/Time:  Monday August 23 2024 08:31:52 EDT Ventricular Rate:  57 PR Interval:  154 QRS Duration:  104 QT Interval:  466 QTC Calculation: 453 R Axis:   2  Text Interpretation: Sinus  bradycardia Moderate voltage criteria for LVH, may be normal variant ( R in aVL , Sokolow-Lyon ) When compared with ECG of 17-Aug-2024 19:52, No significant change was found Confirmed by Loistine Sober [phone]) on 08/23/2024 8:37:53 AM   03/10/2024: AST 21 08/11/2024: ALT 26 08/19/2024: BUN 21; Creatinine, Ser 1.16; Potassium 3.5; Sodium 142   08/19/2024: Hemoglobin 11.8; WBC 8.8   11/03/2023: TSH 2.98   08/17/2024: B Natriuretic Peptide 1,134.2    Physical Exam    VS:  BP 138/68 (BP Location: Left Arm, Patient Position: Sitting, Cuff Size: Large)   Pulse (!) 57   Ht 5' 8 (1.727 m)   Wt 227 lb (103 kg)   BMI 34.52 kg/m  , BMI Body mass index is 34.52 kg/m.  GEN: Well nourished, well developed, in no acute distress. Neck: No JVD or carotid bruits. Cardiac:  RRR. 3/6 systolic murmur. No rubs or gallops.   Respiratory:  Respirations regular and unlabored. Clear to auscultation without rales, wheezing or rhonchi. GI: Soft, nontender, nondistended. Extremities: Radials/DP/PT 2+ and equal bilaterally. No clubbing or cyanosis. No edema.  Skin: Warm and dry, no rash. Neuro: Strength intact.  Assessment & Plan   CAD S/p PCI with DES to proximal LAD July 2005.  Patient denies chest pain, pressure or tightness. EKG demonstrates sinus bradycardia.  - Continue aspirin , rosuvastatin , atenolol .   Chronic HFpEF/aortic stenosis Echo June 2025 demonstrated normal LV/RV function, Grade I DD, mild MR, moderate to severe aortic stenosis with mean gradient 30 mmHg.  Patient with recent hospital admission for heart failure exacerbation.  Patient reports weight down 14 lb and lower extremity edema resolved. He gets mild dyspnea with walking his dog that he manages with pacing and short rest breaks.  Euvolemic and well compensated on exam. Discussed the next steps related to aortic stenosis including R/LHC vs repeat echo. He would like to proceed with repeat echo.  - Continue losartan , hydralazine,  hydrochlorothiazide, atenolol .  - Schedule echo.   Hypertension Patient with recent admission for hypertensive urgency.  He reports BP has improved. BP today 138/68.  - Continue losartan , hydralazine, hydrochlorothiazide, atenolol . If BP trends up would consider changing atenolol  to carvedilol.   Disposition: Schedule echo. Return in 6-8 weeks or sooner as needed.          Signed, Sober HERO. Nyko Gell, DNP, NP-C

## 2024-08-23 ENCOUNTER — Encounter: Payer: Self-pay | Admitting: Student

## 2024-08-23 ENCOUNTER — Ambulatory Visit: Attending: Student | Admitting: Student

## 2024-08-23 VITALS — BP 138/68 | HR 57 | Ht 68.0 in | Wt 227.0 lb

## 2024-08-23 DIAGNOSIS — I10 Essential (primary) hypertension: Secondary | ICD-10-CM

## 2024-08-23 DIAGNOSIS — I5032 Chronic diastolic (congestive) heart failure: Secondary | ICD-10-CM | POA: Diagnosis not present

## 2024-08-23 DIAGNOSIS — I2510 Atherosclerotic heart disease of native coronary artery without angina pectoris: Secondary | ICD-10-CM

## 2024-08-23 DIAGNOSIS — I350 Nonrheumatic aortic (valve) stenosis: Secondary | ICD-10-CM | POA: Diagnosis not present

## 2024-08-23 MED ORDER — LOSARTAN POTASSIUM 100 MG PO TABS: 100.0000 mg | ORAL_TABLET | Freq: Every day | ORAL | 1 refills | Status: DC

## 2024-08-23 MED ORDER — HYDROCHLOROTHIAZIDE 12.5 MG PO CAPS: 12.5000 mg | ORAL_CAPSULE | Freq: Every day | ORAL | 1 refills | Status: DC

## 2024-08-23 MED ORDER — HYDRALAZINE HCL 10 MG PO TABS: 10.0000 mg | ORAL_TABLET | Freq: Three times a day (TID) | ORAL | 1 refills | Status: DC

## 2024-08-23 NOTE — Patient Instructions (Addendum)
 Medication Instructions:   Your physician recommends that you continue on your current medications as directed. Please refer to the Current Medication list given to you today.   *If you need a refill on your cardiac medications before your next appointment, please call your pharmacy*  Lab Work:  No labs ordered today  If you have labs (blood work) drawn today and your tests are completely normal, you will receive your results only by: MyChart Message (if you have MyChart) OR A paper copy in the mail If you have any lab test that is abnormal or we need to change your treatment, we will call you to review the results.  Testing/Procedures: Your physician has requested that you have an echocardiogram. Echocardiography is a painless test that uses sound waves to create images of your heart. It provides your doctor with information about the size and shape of your heart and how well your heart's chambers and valves are working.   You may receive an ultrasound enhancing agent through an IV if needed to better visualize your heart during the echo. This procedure takes approximately one hour.  There are no restrictions for this procedure.  This will take place at [address] [facility]) [address], [place] [address]  Please note: We ask at that you not bring children with you during ultrasound (echo/ vascular) testing. Due to room size and safety concerns, children are not allowed in the ultrasound rooms during exams. Our front office staff cannot provide observation of children in our lobby area while testing is being conducted. An adult accompanying a patient to their appointment will only be allowed in the ultrasound room at the discretion of the ultrasound technician under special circumstances. We apologize for any inconvenience.   Follow-Up: At [facility], you and your health needs are our priority.  As part of our continuing mission to provide you with exceptional  heart care, our providers are all part of one team.  This team includes your primary Cardiologist (physician) and Advanced Practice Providers or APPs (Physician Assistants and Nurse Practitioners) who all work together to provide you with the care you need, when you need it.  Your next appointment:   6 -8  week(s)  Provider:  Lonni Hanson, MD  or Barnie Hila, NP  We recommend signing up for the patient portal called MyChart.  Sign up information is provided on this After Visit Summary.  MyChart is used to connect with patients for Virtual Visits (Telemedicine).  Patients are able to view lab/test results, encounter notes, upcoming appointments, etc.  Non-urgent messages can be sent to your provider as well.   To learn more about what you can do with MyChart, go to [email or web address].

## 2024-08-24 ENCOUNTER — Ambulatory Visit (INDEPENDENT_AMBULATORY_CARE_PROVIDER_SITE_OTHER): Admitting: Family Medicine

## 2024-08-24 ENCOUNTER — Encounter: Payer: Self-pay | Admitting: Family Medicine

## 2024-08-24 VITALS — BP 134/84 | HR 53 | Temp 97.9°F | Ht 68.0 in | Wt 224.1 lb

## 2024-08-24 DIAGNOSIS — I350 Nonrheumatic aortic (valve) stenosis: Secondary | ICD-10-CM

## 2024-08-24 DIAGNOSIS — I5033 Acute on chronic diastolic (congestive) heart failure: Secondary | ICD-10-CM | POA: Diagnosis not present

## 2024-08-24 DIAGNOSIS — Z794 Long term (current) use of insulin: Secondary | ICD-10-CM

## 2024-08-24 DIAGNOSIS — I161 Hypertensive emergency: Secondary | ICD-10-CM

## 2024-08-24 DIAGNOSIS — K529 Noninfective gastroenteritis and colitis, unspecified: Secondary | ICD-10-CM

## 2024-08-24 DIAGNOSIS — I10 Essential (primary) hypertension: Secondary | ICD-10-CM | POA: Diagnosis not present

## 2024-08-24 DIAGNOSIS — E1169 Type 2 diabetes mellitus with other specified complication: Secondary | ICD-10-CM | POA: Diagnosis not present

## 2024-08-24 DIAGNOSIS — Z23 Encounter for immunization: Secondary | ICD-10-CM

## 2024-08-24 DIAGNOSIS — K52832 Lymphocytic colitis: Secondary | ICD-10-CM

## 2024-08-24 LAB — RENAL FUNCTION PANEL
Albumin: 3.8 g/dL (ref 3.5–5.2)
BUN: 43 mg/dL — ABNORMAL HIGH (ref 6–23)
CO2: 29 meq/L (ref 19–32)
Calcium: 9.3 mg/dL (ref 8.4–10.5)
Chloride: 105 meq/L (ref 96–112)
Creatinine, Ser: 1.29 mg/dL (ref 0.40–1.50)
GFR: 52.82 mL/min — ABNORMAL LOW (ref >60.00)
Glucose, Bld: 98 mg/dL (ref 70–99)
Phosphorus: 3.1 mg/dL (ref 2.3–4.6)
Potassium: 3.3 meq/L — ABNORMAL LOW (ref 3.5–5.1)
Sodium: 141 meq/L (ref 135–145)

## 2024-08-24 NOTE — Patient Instructions (Addendum)
 Lab today Flu shot today  Call GI Dr Unk to ask about starting azathioprine  50mg  daily for diarrhea.  Continue current blood pressure medicines.  Return for appt in 1 month (already scheduled).

## 2024-08-24 NOTE — Progress Notes (Unsigned)
 Ph: [phone] Fax: [phone]   Patient ID: Thomas Carlson, male    DOB: September 17, 1946, 78 y.o.   MRN: [id number]  This visit was conducted in person.  BP 134/84   Pulse (!) 53   Temp 97.9 F (36.6 C) (Oral)   Ht 5' 8 (1.727 m)   Wt 224 lb 2 oz (101.7 kg)   SpO2 97%   BMI 34.08 kg/m    CC: hospital f/u visit  Subjective:   HPI: Thomas Carlson is a 78 y.o. male presenting on 08/24/2024 for Hospitalization Follow-up (Pt here for [facility] f/u . Was admitted on 08/17/24 for HTN)   Recent hospitalization for hypertensive emergency associated with acute CHF exacerbation presenting with lower extremity edema.  Hospital records reviewed. Med rec performed.  BP up to 218/80   Atenolol  initially dropped however now continues 25mg  daily. Losartan  increased to 100mg  daily. Hydrochlorothiazide 12.5mg  daily continued. New hydralazine 10mg  TID medication.  He's been checking BP at home 150s systolic - better in office today. Since home, no chest pain, tightness, dyspnea, improving leg swelling.    Ongoing lymphocytic colitis partially responsive to budesonide  - currently states he's on 9mg  daily.  Ongoing diarrhea x10 per day, loose to watery diarrhea.  Saw [facility] Dr Unk prior to latest hospitalization, note reviewed - plan was to start azathioprine  but hasn't yet started.  Tested negative for C diff and other GI pathogens by GI at that time (08/10/2024).  May return to [facility] (Dr Delon Diswis)  Prevnar-20 vaccine - last visit had ache to arm for months as well as large localized reaction.  Lab Results  Component Value Date   HGBA1C 6.0 03/10/2024   Home health not set up.  Other follow up appointments scheduled: saw cardiology yesterday Thomas Hila NP - planned rpt echo to follow known mod-severe aortic stenosis.  ______________________________________________________________________ Hospital admission: 08/17/2024 Hospital discharge: 08/19/2024 TCM f/u phone call:   performed on 08/20/2024.   Recommendations at discharge:  Follow-up with outpatient providers as requested   Discharge Diagnoses: Principal Problem:   Hypertensive emergency Active Problems:   Acute on chronic diastolic CHF (congestive heart failure) (HCC)   CAD (coronary artery disease)   Myocardial injury   Hyperlipidemia associated with type 2 diabetes mellitus (HCC)   Type II diabetes mellitus with renal manifestations (HCC)   Gout   Bradycardia   BPH without obstruction/lower urinary tract symptoms   Chronic kidney disease, stage 3a (HCC)   Lymphocytic colitis   Chronic diarrhea   Obesity (BMI 30-39.9)     Relevant past medical, surgical, family and social history reviewed and updated as indicated. Interim medical history since our last visit reviewed. Allergies and medications reviewed and updated. Outpatient Medications Prior to Visit  Medication Sig Dispense Refill   budesonide  (ENTOCORT EC ) 3 MG 24 hr capsule Take 3 capsules (9 mg total) by mouth daily.     Accu-Chek Softclix Lancets lancets Use as instructed to check blood sugar once a day 100 each 3   acyclovir  (ZOVIRAX ) 400 MG tablet TAKE 1 TABLET EVERY DAY 90 tablet 3   allopurinol  (ZYLOPRIM ) 100 MG tablet Take 1 tablet (100 mg total) by mouth 2 (two) times daily. 180 tablet 4   aspirin  EC 81 MG tablet Take 1 tablet (81 mg total) by mouth daily.     atenolol  (TENORMIN ) 25 MG tablet Take 1 tablet (25 mg total) by mouth daily. 90 tablet 4   azaTHIOprine  (IMURAN ) 50 MG tablet  Take 50 mg by mouth daily. (Patient not taking: Reported on 08/25/2024)     Biotin  1000 MCG tablet Take 1,000 mcg by mouth daily.      Blood Glucose Monitoring Suppl (ACCU-CHEK GUIDE ME) w/Device KIT Use as instructed to check blood sugar once a day 1 kit 0   Cholecalciferol  (VITAMIN D3) 25 MCG (1000 UT) CAPS Take 1 capsule (1,000 Units total) by mouth daily. 30 capsule    Cyanocobalamin  (B-12) 1000 MCG SUBL Place 1 tablet under the tongue once a  week.     glucose blood (ACCU-CHEK GUIDE TEST) test strip Use as instructed to check blood sugar once a day 100 each 2   hydrALAZINE (APRESOLINE) 10 MG tablet Take 1 tablet (10 mg total) by mouth 3 (three) times daily. 90 tablet 1   hydrochlorothiazide (MICROZIDE) 12.5 MG capsule Take 1 capsule (12.5 mg total) by mouth daily. 30 capsule 1   insulin  glargine (LANTUS  SOLOSTAR) 100 UNIT/ML Solostar Pen Inject 10 Units into the skin daily. 15 mL 2   Insulin  Pen Needle (PEN NEEDLES) 31G X 5 MM MISC Use as directed to inject insulin  daily 300 each 3   losartan  (COZAAR ) 100 MG tablet Take 1 tablet (100 mg total) by mouth daily. 30 tablet 1   Magnesium  500 MG TABS Take 500 mg by mouth daily.     metFORMIN  (GLUCOPHAGE -XR) 500 MG 24 hr tablet Take 2 tablets (1,000 mg total) by mouth daily with breakfast. 180 tablet 4   tamsulosin  (FLOMAX ) 0.4 MG CAPS capsule Take 1 capsule (0.4 mg total) by mouth daily. 90 capsule 4   No facility-administered medications prior to visit.     Per HPI unless specifically indicated in ROS section below Review of Systems  Objective:  BP 134/84   Pulse (!) 53   Temp 97.9 F (36.6 C) (Oral)   Ht 5' 8 (1.727 m)   Wt 224 lb 2 oz (101.7 kg)   SpO2 97%   BMI 34.08 kg/m   Wt Readings from Last 3 Encounters:  08/24/24 224 lb 2 oz (101.7 kg)  08/23/24 227 lb (103 kg)  08/20/24 240 lb (108.9 kg)      Physical Exam Vitals and nursing note reviewed.  Constitutional:      Appearance: Normal appearance. He is not ill-appearing.  HENT:     Head: Normocephalic and atraumatic.     Mouth/Throat:     Mouth: Mucous membranes are moist.     Pharynx: Oropharynx is clear. No oropharyngeal exudate or posterior oropharyngeal erythema.  Eyes:     Extraocular Movements: Extraocular movements intact.     Conjunctiva/sclera: Conjunctivae normal.     Pupils: Pupils are equal, round, and reactive to light.  Cardiovascular:     Rate and Rhythm: Normal rate and regular rhythm.      Pulses: Normal pulses.     Heart sounds: Normal heart sounds. No murmur heard. Pulmonary:     Effort: Pulmonary effort is normal. No respiratory distress.     Breath sounds: Normal breath sounds. No wheezing, rhonchi or rales.  Abdominal:     General: Bowel sounds are normal. There is no distension.     Palpations: Abdomen is soft. There is no mass.     Tenderness: There is no abdominal tenderness. There is no guarding or rebound.     Hernia: No hernia is present.  Musculoskeletal:     Right lower leg: No edema.     Left lower leg: No edema.  Skin:    General: Skin is warm and dry.     Findings: No rash.  Neurological:     Mental Status: He is alert.  Psychiatric:        Mood and Affect: Mood normal.        Behavior: Behavior normal.       Lab Results  Component Value Date   NA 141 08/24/2024   CL 105 08/24/2024   K 3.3 (L) 08/24/2024   CO2 29 08/24/2024   BUN 43 (H) 08/24/2024   CREATININE 1.29 08/24/2024   GFRNONAA >60 08/19/2024   CALCIUM  9.3 08/24/2024   PHOS 3.1 08/24/2024   ALBUMIN 3.8 08/24/2024   GLUCOSE 98 08/24/2024   Lab Results  Component Value Date   PTH 32 03/10/2024   CALCIUM  9.3 08/24/2024   PHOS 3.1 08/24/2024   Lab Results  Component Value Date   VD25OH 44.98 03/10/2024   Echocardiogram 03/2024: LVEF 55-60%, no RWMA, G1DD, mild MR, mild AR, mod-severe AS.  Assessment & Plan:   Problem List Items Addressed This Visit     Type 2 diabetes mellitus with other specified complication (HCC)   He is back on budesonide  - caution with hyperglycemia.  Continue lantus  and metformin  XR.       Hypertension   Chronic, improving on current regimen - continue higher losartan  and new hydralazine dose. Continue hydrochlorothiazide 12.5mg  and atenolol  25mg  daily.       Lymphocytic colitis   Has been on apriso , currently on budesonide  9mg  daily, plan was to start azathioprine  but has not yet started. I asked him to contact Dr Unk about starting  azathioprine .  Recent GI pathogen panel, C diff testing returned normal.       Chronic diarrhea   Appreciate GI care.  Continues budesonide , to check with GI about azathioprine  commencement.       Moderate to severe aortic stenosis   Progression of aortic stenosis (moderate to severe), along with mild MR.  Regularly seeing cardiology with rpt echo planned.       Hypertensive emergency   Recent hospitalization for this, BP is better controlled on current regimen - continue.       Acute on chronic diastolic CHF (congestive heart failure) (HCC) - Primary   Recent hospitalization for this s/p IV duiresis with good effect (14 lb weight loss).  Currently only on low dose hydrochlorothiazide diuretic - consider lasix if weight gain, leg swelling recurring.  Currently symptoms are stable on current regimen.  Avoiding loop diuretic in h/o aortic stenosis, and previous use caused intolerable symptoms.       Other Visit Diagnoses       Encounter for immunization       Relevant Orders   Flu vaccine HIGH DOSE PF(Fluzone Trivalent) (Completed)     Hypocalcemia       Relevant Orders   Renal function panel (Completed)        Meds ordered this encounter  Medications   potassium chloride  (KLOR-CON  M) 10 MEQ tablet    Sig: Take 1 tablet (10 mEq total) by mouth daily.    Dispense:  90 tablet    Refill:  3    Orders Placed This Encounter  Procedures   Flu vaccine HIGH DOSE PF(Fluzone Trivalent)   Renal function panel    Patient Instructions  Lab today Flu shot today  Call GI Dr Unk to ask about starting azathioprine  50mg  daily for diarrhea.  Continue current blood pressure medicines.  Return  for appt in 1 month (already scheduled).   Follow up plan: Return if symptoms worsen or fail to improve.  Anton Blas, MD

## 2024-08-25 ENCOUNTER — Ambulatory Visit: Payer: Self-pay | Admitting: Family Medicine

## 2024-08-25 MED ORDER — POTASSIUM CHLORIDE CRYS ER 10 MEQ PO TBCR: 10.0000 meq | EXTENDED_RELEASE_TABLET | Freq: Every day | ORAL | 3 refills | Status: DC

## 2024-08-25 NOTE — Assessment & Plan Note (Signed)
 Progression of aortic stenosis (moderate to severe), along with mild MR.  Regularly seeing cardiology with rpt echo planned.

## 2024-08-25 NOTE — Assessment & Plan Note (Signed)
 Recent hospitalization for this, BP is better controlled on current regimen - continue.

## 2024-08-25 NOTE — Assessment & Plan Note (Signed)
 He is back on budesonide  - caution with hyperglycemia.  Continue lantus  and metformin  XR.

## 2024-08-25 NOTE — Assessment & Plan Note (Addendum)
 Chronic, improving on current regimen - continue higher losartan  and new hydralazine dose. Continue hydrochlorothiazide 12.5mg  and atenolol  25mg  daily.

## 2024-08-25 NOTE — Assessment & Plan Note (Signed)
 Appreciate GI care.  Continues budesonide , to check with GI about azathioprine  commencement.

## 2024-08-25 NOTE — Assessment & Plan Note (Addendum)
 Recent hospitalization for this s/p IV duiresis with good effect (14 lb weight loss).  Currently only on low dose hydrochlorothiazide diuretic - consider lasix if weight gain, leg swelling recurring.  Currently symptoms are stable on current regimen.  Avoiding loop diuretic in h/o aortic stenosis, and previous use caused intolerable symptoms.

## 2024-08-25 NOTE — Addendum Note (Signed)
 Addended by: [name] BALLER on: 08/25/2024 07:52 AM   Modules accepted: Level of Service

## 2024-08-25 NOTE — Assessment & Plan Note (Signed)
 Has been on apriso , currently on budesonide  9mg  daily, plan was to start azathioprine  but has not yet started. I asked him to contact Dr Unk about starting azathioprine .  Recent GI pathogen panel, C diff testing returned normal.

## 2024-08-27 ENCOUNTER — Telehealth: Payer: Self-pay

## 2024-09-02 DIAGNOSIS — K52832 Lymphocytic colitis: Secondary | ICD-10-CM | POA: Diagnosis not present

## 2024-09-02 DIAGNOSIS — K529 Noninfective gastroenteritis and colitis, unspecified: Secondary | ICD-10-CM | POA: Diagnosis not present

## 2024-09-07 DIAGNOSIS — L821 Other seborrheic keratosis: Secondary | ICD-10-CM | POA: Diagnosis not present

## 2024-09-07 DIAGNOSIS — L814 Other melanin hyperpigmentation: Secondary | ICD-10-CM | POA: Diagnosis not present

## 2024-09-07 DIAGNOSIS — D225 Melanocytic nevi of trunk: Secondary | ICD-10-CM | POA: Diagnosis not present

## 2024-09-07 DIAGNOSIS — K52832 Lymphocytic colitis: Secondary | ICD-10-CM | POA: Diagnosis not present

## 2024-09-12 ENCOUNTER — Other Ambulatory Visit: Payer: Self-pay | Admitting: Family Medicine

## 2024-09-12 DIAGNOSIS — E1169 Type 2 diabetes mellitus with other specified complication: Secondary | ICD-10-CM

## 2024-09-18 ENCOUNTER — Other Ambulatory Visit: Payer: Self-pay

## 2024-09-18 ENCOUNTER — Emergency Department
Admission: EM | Admit: 2024-09-18 | Discharge: 2024-09-18 | Disposition: A | Discharge status: Home / Self Care | Attending: Emergency Medicine | Admitting: Emergency Medicine

## 2024-09-18 ENCOUNTER — Emergency Department

## 2024-09-18 DIAGNOSIS — I509 Heart failure, unspecified: Secondary | ICD-10-CM | POA: Insufficient documentation

## 2024-09-18 DIAGNOSIS — R55 Syncope and collapse: Secondary | ICD-10-CM | POA: Diagnosis not present

## 2024-09-18 DIAGNOSIS — I959 Hypotension, unspecified: Secondary | ICD-10-CM | POA: Diagnosis not present

## 2024-09-18 DIAGNOSIS — R0989 Other specified symptoms and signs involving the circulatory and respiratory systems: Secondary | ICD-10-CM | POA: Diagnosis not present

## 2024-09-18 DIAGNOSIS — E119 Type 2 diabetes mellitus without complications: Secondary | ICD-10-CM | POA: Diagnosis not present

## 2024-09-18 DIAGNOSIS — N179 Acute kidney failure, unspecified: Secondary | ICD-10-CM | POA: Diagnosis not present

## 2024-09-18 DIAGNOSIS — I110 Hypertensive heart disease with heart failure: Secondary | ICD-10-CM | POA: Insufficient documentation

## 2024-09-18 DIAGNOSIS — R61 Generalized hyperhidrosis: Secondary | ICD-10-CM | POA: Diagnosis not present

## 2024-09-18 DIAGNOSIS — E860 Dehydration: Secondary | ICD-10-CM | POA: Diagnosis not present

## 2024-09-18 LAB — CBC WITH DIFFERENTIAL/PLATELET
Abs Immature Granulocytes: 0.01 K/uL (ref 0.00–0.07)
Basophils Absolute: 0 K/uL (ref 0.0–0.1)
Basophils Relative: 0 %
Eosinophils Absolute: 0.1 K/uL (ref 0.0–0.5)
Eosinophils Relative: 1 %
HCT: 34 % — ABNORMAL LOW (ref 39.0–52.0)
Hemoglobin: 11.3 g/dL — ABNORMAL LOW (ref 13.0–17.0)
Immature Granulocytes: 0 %
Lymphocytes Relative: 24 %
Lymphs Abs: 1.1 K/uL (ref 0.7–4.0)
MCH: 32.8 pg (ref 26.0–34.0)
MCHC: 33.2 g/dL (ref 30.0–36.0)
MCV: 98.6 fL (ref 80.0–100.0)
Monocytes Absolute: 0.2 K/uL (ref 0.1–1.0)
Monocytes Relative: 5 %
Neutro Abs: 3.2 K/uL (ref 1.7–7.7)
Neutrophils Relative %: 70 %
Platelets: 168 K/uL (ref 150–400)
RBC: 3.45 MIL/uL — ABNORMAL LOW (ref 4.22–5.81)
RDW: 16.5 % — ABNORMAL HIGH (ref 11.5–15.5)
WBC: 4.6 K/uL (ref 4.0–10.5)
nRBC: 0 % (ref 0.0–0.2)

## 2024-09-18 LAB — COMPREHENSIVE METABOLIC PANEL WITH GFR
ALT: 17 U/L (ref 0–44)
AST: 15 U/L (ref 15–41)
Albumin: 3.7 g/dL (ref 3.5–5.0)
Alkaline Phosphatase: 61 U/L (ref 38–126)
Anion gap: 12 (ref 5–15)
BUN: 36 mg/dL — ABNORMAL HIGH (ref 8–23)
CO2: 22 mmol/L (ref 22–32)
Calcium: 9.1 mg/dL (ref 8.9–10.3)
Chloride: 105 mmol/L (ref 98–111)
Creatinine, Ser: 1.93 mg/dL — ABNORMAL HIGH (ref 0.61–1.24)
GFR, Estimated: 35 mL/min — ABNORMAL LOW (ref >60)
Glucose, Bld: 167 mg/dL — ABNORMAL HIGH (ref 70–99)
Potassium: 4.4 mmol/L (ref 3.5–5.1)
Sodium: 139 mmol/L (ref 135–145)
Total Bilirubin: 0.4 mg/dL (ref 0.0–1.2)
Total Protein: 5.7 g/dL — ABNORMAL LOW (ref 6.5–8.1)

## 2024-09-18 LAB — TROPONIN T, HIGH SENSITIVITY
Troponin T High Sensitivity: 49 ng/L — ABNORMAL HIGH (ref 0–19)
Troponin T High Sensitivity: 43 ng/L — ABNORMAL HIGH (ref 0–19)

## 2024-09-18 LAB — PRO BRAIN NATRIURETIC PEPTIDE: Pro Brain Natriuretic Peptide: 751 pg/mL — ABNORMAL HIGH (ref <300.0)

## 2024-09-18 MED ORDER — LACTATED RINGERS IV BOLUS
2000.0000 mL | Freq: Once | INTRAVENOUS | Status: AC
  Administered 2024-09-18: 2000 mL via INTRAVENOUS

## 2024-09-18 MED ORDER — LACTATED RINGERS IV BOLUS
1000.0000 mL | Freq: Once | INTRAVENOUS | Status: AC
  Administered 2024-09-18: 1000 mL via INTRAVENOUS

## 2024-09-18 NOTE — ED Notes (Signed)
Pt able to ambulate without assistance. Denies dizziness.

## 2024-09-18 NOTE — Discharge Instructions (Signed)
 Follow-up with your doctor on Monday and have them recheck your kidney function (creatinine).  It had increased up to 1.9 today prior to IV fluids.  If you have additional episodes of passing out or other concerns of please return to the ED

## 2024-09-18 NOTE — ED Provider Notes (Signed)
 [facility] Provider Note   Event Date/Time   First MD Initiated Contact with Patient 09/18/24 1437     (approximate) History  Hypotension, Loss of Consciousness, and Dizziness  HPI Thomas Carlson is a 78 y.o. male with a past medical history of severe aortic stenosis, CHF, hypertension, and type 2 diabetes who presents via EMS after an episode of syncope.  EMS found patient's blood pressure to be in the 60s/40s and responded minimally to fluids and route arriving with blood pressures in the 80s/60s.  Patient denies any lightheadedness at this time.  Patient denies any chest pain, palpitations, or lightheadedness prior to passing out.  Patient states that he woke up soon after without confusion, bowel/bladder incontinence, or headache.  Patient has no complaints at this time ROS: Patient currently denies any vision changes, tinnitus, difficulty speaking, facial droop, sore throat, chest pain, shortness of breath, abdominal pain, nausea/vomiting/diarrhea, dysuria, or weakness/numbness/paresthesias in any extremity   Physical Exam  Triage Vital Signs: ED Triage Vitals [09/18/24 1432]  Encounter Vitals Group     BP      Girls Systolic BP Percentile      Girls Diastolic BP Percentile      Boys Systolic BP Percentile      Boys Diastolic BP Percentile      Pulse      Resp      Temp      Temp src      SpO2 99 %     Weight      Height      Head Circumference      Peak Flow      Pain Score      Pain Loc      Pain Education      Exclude from Growth Chart    Most recent vital signs: Vitals:   09/18/24 1450 09/18/24 1500  BP: (!) 84/36 (!) 89/45  Pulse: 63 66  Resp: (!) 22 16  Temp:    SpO2:     General: Awake, oriented x4. CV:  Good peripheral perfusion.  4/6 holosystolic murmur Resp:  Normal effort. Abd:  No distention. Other:  Elderly overweight Caucasian male resting comfortably in no acute distress ED Results / Procedures / Treatments  Labs (all  labs ordered are listed, but only abnormal results are displayed) Labs Reviewed  COMPREHENSIVE METABOLIC PANEL WITH GFR - Abnormal; Notable for the following components:      Result Value   Glucose, Bld 167 (*)    BUN 36 (*)    Creatinine, Ser 1.93 (*)    Total Protein 5.7 (*)    GFR, Estimated 35 (*)    All other components within normal limits  PRO BRAIN NATRIURETIC PEPTIDE - Abnormal; Notable for the following components:   Pro Brain Natriuretic Peptide 751.0 (*)    All other components within normal limits  CBC WITH DIFFERENTIAL/PLATELET - Abnormal; Notable for the following components:   RBC 3.45 (*)    Hemoglobin 11.3 (*)    HCT 34.0 (*)    RDW 16.5 (*)    All other components within normal limits  TROPONIN T, HIGH SENSITIVITY - Abnormal; Notable for the following components:   Troponin T High Sensitivity 49 (*)    All other components within normal limits  URINALYSIS, ROUTINE W REFLEX MICROSCOPIC   EKG ED ECG REPORT I, Artist MARLA Kerns, the attending physician, personally viewed and interpreted this ECG. Date: 09/18/2024 EKG Time: 1448 Rate: 61  Rhythm: normal sinus rhythm QRS Axis: normal Intervals: normal ST/T Wave abnormalities: normal Narrative Interpretation: no evidence of acute ischemia RADIOLOGY ED MD interpretation: One-view portable chest x-ray interpreted by me shows no evidence of acute abnormalities including no pneumonia, pneumothorax, or widened mediastinum - All radiology independently interpreted and agree with radiology assessment Official radiology report(s): DG Chest Port 1 View Result Date: 09/18/2024 CLINICAL DATA:  syncope EXAM: PORTABLE CHEST 1 VIEW COMPARISON:  August 17, 2024 FINDINGS: The cardiomediastinal silhouette is unchanged in contour.Atherosclerotic calcifications. Low lung volume radiograph. No pleural effusion. No pneumothorax. No acute pleuroparenchymal abnormality. IMPRESSION: No acute cardiopulmonary abnormality. Electronically  Signed   By: Corean Salter M.D.   On: 09/18/2024 15:17   PROCEDURES: Critical Care performed: No Procedures MEDICATIONS ORDERED IN ED: Medications  lactated ringers  bolus 1,000 mL (0 mLs Intravenous Stopped 09/18/24 1511)  lactated ringers  bolus 2,000 mL (2,000 mLs Intravenous New Bag/Given 09/18/24 1515)   IMPRESSION / MDM / ASSESSMENT AND PLAN / ED COURSE  I reviewed the triage vital signs and the nursing notes.                             The patient is on the cardiac monitor to evaluate for evidence of arrhythmia and/or significant heart rate changes. Patient's presentation is most consistent with acute presentation with potential threat to life or bodily function. Patient is a 78 year old male with the above-stated past medical history who presents via EMS after an episode of syncope just prior to arrival as well as hypotension DDx: Critical aortic stenosis, ACS, CHF, arrhythmia Plan: CBC, CMP, UA, troponin, BNP, chest x-ray, EKG  Care of this patient will be signed out to the oncoming physician at the end of my shift.  All pertinent patient information conveyed and all questions answered.  All further care and disposition decisions will be made by the oncoming physician.   FINAL CLINICAL IMPRESSION(S) / ED DIAGNOSES   Final diagnoses:  Syncope and collapse   Rx / DC Orders   ED Discharge Orders     None      Note:  This document was prepared using Dragon voice recognition software and may include unintentional dictation errors.   Jossie Artist POUR, MD 09/18/24 [phone]

## 2024-09-18 NOTE — ED Triage Notes (Signed)
 Per EMS:  Hypotension BP 50 Pap with EMS Last BP 80pap Given 1L of NS Loss of Consciousness (on/off) Sudden onset Lasted a few seconds Dizziness Started today when patient got to the game Loss of Bowel Control Happened after LOC

## 2024-09-18 NOTE — ED Provider Notes (Signed)
 Patient received in signout from Dr. Jossie pending majority of workup.  Patient presents hypotensive after syncopal episode at a football game.  History of aortic stenosis.   History of chronic diarrhea that waxes and wanes, patient reports increased diarrhea over the past 24 hours which is not atypical for him.  After getting out of the car and was walking to a football game he developed dizziness and reportedly had a syncopal episode.  Quite hypotensive here in the ED despite first liter IV fluids, provide additional IV fluids and his BP normalizes and his symptoms resolved and he is feeling much better.  No chest pain.  Mild elevation of troponin and flat on repeat without significant changing.  No worsening respiratory status or signs of volume overload/CHF in the setting of his volume resuscitation.  AKI metabolic panel.  After fluid resuscitation and normalization of his symptoms and BP, he gets up and ambulates with nursing staff at his baseline.  Ambulated briskly in the hall by himself without symptoms.  Fortuitously he has follow-up previously scheduled on Monday with his PCP.  We discussed creatinine recheck with PCP and ED return precautions.  I strongly considered admission for this patient but with family support, PCP follow-up and resolution of symptoms I think a trial of outpatient management would be reasonable.  Clinical Course as of 09/18/24 1814  Sat Sep 18, 2024  1740 Reassessed, BP is normalized and he is feeling much better, symptoms have resolved after IV fluids.  He reports increased diarrhea over the past 1 day.  Chronic diarrhea waxes and wanes.  We discussed ambulation trial.  He is wanting to go home.  Fortuitously he has follow-up with his PCP on Monday we discussed creatinine recheck at that point. [DS]    Clinical Course User Index [DS] Claudene Rover, MD   .Critical Care  Performed by: Claudene Rover, MD Authorized by: Claudene Rover, MD   Critical care provider  statement:    Critical care time (minutes):  30   Critical care time was exclusive of:  Separately billable procedures and treating other patients   Critical care was necessary to treat or prevent imminent or life-threatening deterioration of the following conditions:  Circulatory failure   Critical care was time spent personally by me on the following activities:  Development of treatment plan with patient or surrogate, discussions with consultants, evaluation of patient's response to treatment, examination of patient, ordering and review of laboratory studies, ordering and review of radiographic studies, ordering and performing treatments and interventions, pulse oximetry, re-evaluation of patient's condition and review of old charts      Claudene Rover, MD 09/18/24 1816

## 2024-09-20 ENCOUNTER — Observation Stay: Admission: EM | Admit: 2024-09-20 | Discharge: 2024-09-22 | Disposition: A

## 2024-09-20 ENCOUNTER — Ambulatory Visit: Admitting: Family Medicine

## 2024-09-20 ENCOUNTER — Encounter: Payer: Self-pay | Admitting: Family Medicine

## 2024-09-20 ENCOUNTER — Other Ambulatory Visit: Payer: Self-pay

## 2024-09-20 ENCOUNTER — Emergency Department

## 2024-09-20 VITALS — BP 70/40 | HR 74 | Temp 97.9°F | Ht 68.0 in | Wt 222.5 lb

## 2024-09-20 DIAGNOSIS — I9589 Other hypotension: Principal | ICD-10-CM | POA: Insufficient documentation

## 2024-09-20 DIAGNOSIS — Z955 Presence of coronary angioplasty implant and graft: Secondary | ICD-10-CM | POA: Diagnosis not present

## 2024-09-20 DIAGNOSIS — M1A20X0 Drug-induced chronic gout, unspecified site, without tophus (tophi): Secondary | ICD-10-CM | POA: Diagnosis present

## 2024-09-20 DIAGNOSIS — N179 Acute kidney failure, unspecified: Secondary | ICD-10-CM | POA: Diagnosis not present

## 2024-09-20 DIAGNOSIS — Z794 Long term (current) use of insulin: Secondary | ICD-10-CM | POA: Insufficient documentation

## 2024-09-20 DIAGNOSIS — E66811 Obesity, class 1: Secondary | ICD-10-CM | POA: Insufficient documentation

## 2024-09-20 DIAGNOSIS — Z79899 Other long term (current) drug therapy: Secondary | ICD-10-CM | POA: Diagnosis not present

## 2024-09-20 DIAGNOSIS — I350 Nonrheumatic aortic (valve) stenosis: Secondary | ICD-10-CM

## 2024-09-20 DIAGNOSIS — R197 Diarrhea, unspecified: Secondary | ICD-10-CM | POA: Diagnosis not present

## 2024-09-20 DIAGNOSIS — R55 Syncope and collapse: Secondary | ICD-10-CM

## 2024-09-20 DIAGNOSIS — Z7982 Long term (current) use of aspirin: Secondary | ICD-10-CM | POA: Diagnosis not present

## 2024-09-20 DIAGNOSIS — I5032 Chronic diastolic (congestive) heart failure: Secondary | ICD-10-CM

## 2024-09-20 DIAGNOSIS — Z6834 Body mass index (BMI) 34.0-34.9, adult: Secondary | ICD-10-CM | POA: Insufficient documentation

## 2024-09-20 DIAGNOSIS — K52832 Lymphocytic colitis: Secondary | ICD-10-CM | POA: Diagnosis not present

## 2024-09-20 DIAGNOSIS — Z87898 Personal history of other specified conditions: Secondary | ICD-10-CM

## 2024-09-20 DIAGNOSIS — K529 Noninfective gastroenteritis and colitis, unspecified: Secondary | ICD-10-CM | POA: Diagnosis present

## 2024-09-20 DIAGNOSIS — I10 Essential (primary) hypertension: Secondary | ICD-10-CM | POA: Diagnosis present

## 2024-09-20 DIAGNOSIS — I2510 Atherosclerotic heart disease of native coronary artery without angina pectoris: Secondary | ICD-10-CM | POA: Insufficient documentation

## 2024-09-20 DIAGNOSIS — Z8669 Personal history of other diseases of the nervous system and sense organs: Secondary | ICD-10-CM | POA: Insufficient documentation

## 2024-09-20 DIAGNOSIS — I110 Hypertensive heart disease with heart failure: Secondary | ICD-10-CM | POA: Diagnosis not present

## 2024-09-20 DIAGNOSIS — R42 Dizziness and giddiness: Secondary | ICD-10-CM

## 2024-09-20 DIAGNOSIS — I959 Hypotension, unspecified: Principal | ICD-10-CM | POA: Diagnosis present

## 2024-09-20 DIAGNOSIS — R918 Other nonspecific abnormal finding of lung field: Secondary | ICD-10-CM | POA: Diagnosis not present

## 2024-09-20 DIAGNOSIS — E119 Type 2 diabetes mellitus without complications: Secondary | ICD-10-CM | POA: Insufficient documentation

## 2024-09-20 DIAGNOSIS — M1A29X0 Drug-induced chronic gout, multiple sites, without tophus (tophi): Secondary | ICD-10-CM

## 2024-09-20 DIAGNOSIS — I517 Cardiomegaly: Secondary | ICD-10-CM | POA: Diagnosis not present

## 2024-09-20 LAB — HEPATIC FUNCTION PANEL
ALT: 19 U/L (ref 0–44)
AST: 24 U/L (ref 15–41)
Albumin: 4.1 g/dL (ref 3.5–5.0)
Alkaline Phosphatase: 70 U/L (ref 38–126)
Bilirubin, Direct: 0.2 mg/dL (ref 0.0–0.2)
Indirect Bilirubin: 0.3 mg/dL (ref 0.3–0.9)
Total Bilirubin: 0.5 mg/dL (ref 0.0–1.2)
Total Protein: 6.6 g/dL (ref 6.5–8.1)

## 2024-09-20 LAB — BASIC METABOLIC PANEL WITH GFR
Anion gap: 13 (ref 5–15)
BUN: 29 mg/dL — ABNORMAL HIGH (ref 8–23)
CO2: 23 mmol/L (ref 22–32)
Calcium: 10.1 mg/dL (ref 8.9–10.3)
Chloride: 101 mmol/L (ref 98–111)
Creatinine, Ser: 1.7 mg/dL — ABNORMAL HIGH (ref 0.61–1.24)
GFR, Estimated: 41 mL/min — ABNORMAL LOW (ref >60)
Glucose, Bld: 121 mg/dL — ABNORMAL HIGH (ref 70–99)
Potassium: 4.2 mmol/L (ref 3.5–5.1)
Sodium: 137 mmol/L (ref 135–145)

## 2024-09-20 LAB — CBC
HCT: 37.7 % — ABNORMAL LOW (ref 39.0–52.0)
Hemoglobin: 12.7 g/dL — ABNORMAL LOW (ref 13.0–17.0)
MCH: 32.8 pg (ref 26.0–34.0)
MCHC: 33.7 g/dL (ref 30.0–36.0)
MCV: 97.4 fL (ref 80.0–100.0)
Platelets: 209 K/uL (ref 150–400)
RBC: 3.87 MIL/uL — ABNORMAL LOW (ref 4.22–5.81)
RDW: 16 % — ABNORMAL HIGH (ref 11.5–15.5)
WBC: 4.8 K/uL (ref 4.0–10.5)
nRBC: 0 % (ref 0.0–0.2)

## 2024-09-20 LAB — CBG MONITORING, ED: Glucose-Capillary: 92 mg/dL (ref 70–99)

## 2024-09-20 LAB — TSH: TSH: 4.38 u[IU]/mL (ref 0.350–4.500)

## 2024-09-20 LAB — PROTIME-INR
INR: 1.1 (ref 0.8–1.2)
Prothrombin Time: 14.5 s (ref 11.4–15.2)

## 2024-09-20 LAB — CORTISOL: Cortisol, Plasma: 15.9 ug/dL

## 2024-09-20 LAB — TROPONIN T, HIGH SENSITIVITY: Troponin T High Sensitivity: 37 ng/L — ABNORMAL HIGH (ref 0–19)

## 2024-09-20 LAB — LACTIC ACID, PLASMA: Lactic Acid, Venous: 1.3 mmol/L (ref 0.5–1.9)

## 2024-09-20 MED ORDER — ONDANSETRON HCL 4 MG/2ML IJ SOLN: 4.0000 mg | Freq: Four times a day (QID) | INTRAMUSCULAR | Status: DC | PRN

## 2024-09-20 MED ORDER — INSULIN ASPART 100 UNIT/ML IJ SOLN: 0.0000 [IU] | Freq: Every day | INTRAMUSCULAR | Status: DC

## 2024-09-20 MED ORDER — AZATHIOPRINE 50 MG PO TABS
50.0000 mg | ORAL_TABLET | Freq: Every day | ORAL | Status: DC
  Administered 2024-09-21 – 2024-09-22 (×2): 50 mg via ORAL
  Filled 2024-09-20 (×2): qty 1

## 2024-09-20 MED ORDER — INSULIN GLARGINE-YFGN 100 UNIT/ML ~~LOC~~ SOLN
10.0000 [IU] | Freq: Every day | SUBCUTANEOUS | Status: DC
  Administered 2024-09-21 – 2024-09-22 (×2): 10 [IU] via SUBCUTANEOUS
  Filled 2024-09-20 (×2): qty 0.1

## 2024-09-20 MED ORDER — SODIUM CHLORIDE 0.9 % IV BOLUS
1000.0000 mL | Freq: Once | INTRAVENOUS | Status: AC
  Administered 2024-09-20: 1000 mL via INTRAVENOUS

## 2024-09-20 MED ORDER — ACETAMINOPHEN 650 MG RE SUPP: 650.0000 mg | Freq: Four times a day (QID) | RECTAL | Status: DC | PRN

## 2024-09-20 MED ORDER — ENOXAPARIN SODIUM 60 MG/0.6ML IJ SOSY
0.5000 mg/kg | PREFILLED_SYRINGE | INTRAMUSCULAR | Status: DC
  Administered 2024-09-20 – 2024-09-21 (×2): 52.5 mg via SUBCUTANEOUS
  Filled 2024-09-20 (×2): qty 0.6

## 2024-09-20 MED ORDER — ACETAMINOPHEN 325 MG PO TABS: 650.0000 mg | ORAL_TABLET | Freq: Four times a day (QID) | ORAL | Status: DC | PRN

## 2024-09-20 MED ORDER — ASPIRIN 81 MG PO TBEC
81.0000 mg | DELAYED_RELEASE_TABLET | Freq: Every day | ORAL | Status: DC
  Administered 2024-09-20 – 2024-09-22 (×3): 81 mg via ORAL
  Filled 2024-09-20 (×3): qty 1

## 2024-09-20 MED ORDER — ONDANSETRON HCL 4 MG PO TABS: 4.0000 mg | ORAL_TABLET | Freq: Four times a day (QID) | ORAL | Status: DC | PRN

## 2024-09-20 MED ORDER — HYDROCODONE-ACETAMINOPHEN 5-325 MG PO TABS: 1.0000 | ORAL_TABLET | ORAL | Status: DC | PRN

## 2024-09-20 MED ORDER — SODIUM CHLORIDE 0.9 % IV SOLN: INTRAVENOUS | Status: DC

## 2024-09-20 MED ORDER — INSULIN ASPART 100 UNIT/ML IJ SOLN: 0.0000 [IU] | Freq: Three times a day (TID) | INTRAMUSCULAR | Status: DC

## 2024-09-20 MED ORDER — SODIUM CHLORIDE 0.9% FLUSH
3.0000 mL | Freq: Two times a day (BID) | INTRAVENOUS | Status: DC
  Administered 2024-09-20 – 2024-09-22 (×3): 3 mL via INTRAVENOUS

## 2024-09-20 MED ORDER — TAMSULOSIN HCL 0.4 MG PO CAPS
0.4000 mg | ORAL_CAPSULE | Freq: Every day | ORAL | Status: DC
  Administered 2024-09-21 – 2024-09-22 (×2): 0.4 mg via ORAL
  Filled 2024-09-20 (×2): qty 1

## 2024-09-20 NOTE — ED Notes (Signed)
 ED Provider at bedside.

## 2024-09-20 NOTE — Assessment & Plan Note (Signed)
 Elevated troponin, but downtrending from 2 days prior so ACS not suspected Patient denies chest pain and EKG nonacute Getting echocardiogram so will evaluate for segmental wall motion abnormality Continue aspirin  and Crestor  Hold blood pressure lowering GDMT of losartan  and atenolol 

## 2024-09-20 NOTE — Assessment & Plan Note (Signed)
 Clinically euvolemic Holding BP lowering GDMT in the setting of hypotension (atenolol , hydralazine , losartan , furosemide  and hydrochlorothiazide ) Monitor for fluid overload in view of IV fluid to treat hypotension Daily weights with intake and output monitoring

## 2024-09-20 NOTE — Assessment & Plan Note (Signed)
 Multifactorial, hypotension, aortic stenosis  Will get repeat echo Cardiology consult Management of hypertension as previously outlined

## 2024-09-20 NOTE — Assessment & Plan Note (Signed)
 Continue Lantus  Sliding scale coverage

## 2024-09-20 NOTE — ED Triage Notes (Signed)
 Pt to ED via POV from home. Pt had a follow up appointment at PCP and was sent due to low BP. Pt seen on 11/22 for same. Pt endorses dizziness.

## 2024-09-20 NOTE — H&P (Signed)
 History and Physical    Patient: Thomas Carlson FMW:[id number] DOB: May 03, 1946 DOA: 09/20/2024 DOS: the patient was seen and examined on 09/20/2024 PCP: [name] Baller, MD  Patient coming from: Home  Chief Complaint:  Chief Complaint  Patient presents with   Hypotension    HPI: Thomas Carlson is a 78 y.o. male with medical history significant for HFpEF, HTN, insulin -dependent type 2 DM, CAD s/p LAD stent, aortic stenosis(moderate to severe, 03/2024), chronic  Diarrhea secondary to lymphocytic colitis, remote seizure in the setting of subdural hematoma , with history of hospitalization a month ago for hypertensive emergency being admitted today for protracted hypotension.  Patient was actually seen in the ED 2 days ago with a syncopal episode while at a ball game and was noted to be markedly hypotensive on arrival with an associated AKI.  He improved with IV fluids and after shared decision making, he was discharged home to follow-up with PCP.  He followed up with PCP today who found him to be hypotensive in spite of cutting back on his antihypertensives(atenolol , hydralazine , HCTZ, losartan , furosemide ) since his ED visit on 11/22.  He feels lightheaded but has had no further syncopal episodes.  He denies chest pain.  Has chronic dyspnea on exertion, not worse than baseline. Of note, patient was seen by GI a couple weeks prior (09/07/2024), for recent flareup of his diarrhea related to his lymphocytic colitis, at the time having up to 10 BMs daily including nocturnal episodes, since improved with initiation of azathioprine  Upon arrival in the ED, BP as low as 65/41, improving with IV fluid resuscitation to 117/57 by admission.  Other vitals were within normal limits. Labs were notable for troponin of 37 (down from 49->47 a couple days prior) Creatinine 1.7 (down from 1.93 on 11/22 but up from baseline of 1.16 a month ago) WBC normal at 4.8 with normal lactic acid of 1.3 Hemoglobin 12.7  (near baseline) Hepatic function panel and TSH normal.  Random cortisol, urinalysis and GI panel stool pending EKG showed sinus at 64 with LVH Chest x-ray showed atelectasis or infiltration of lung bases developing since prior study on 09/18/2024  Patient treated with a 2 L NS bolus Admission requested     Past Medical History:  Diagnosis Date   Acute posthemorrhagic anemia    Allergy    Aortic valve sclerosis 08/13/2016   Sclerosis without stenosis by US  (08/2016)   CAD (coronary artery disease) 2005   s/p stent ([facility])   Cancer (HCC) 3/25   Cholelithiasis    by CT   Chronic kidney disease, stage 3, mod decreased GFR (HCC)    Diabetes mellitus (HCC) 2010   Diverticulosis    by CT   DJD (degenerative joint disease)    knee   Encephalomalacia without cerebral infarction 03/08/2019   Remote anterior frontal and central MCA encephalomalacia after remote trauma noted on MRI 2020 [facility])   Genital herpes    Gout    Heart murmur    followed by PCP   HLD (hyperlipidemia)    HTN (hypertension)    Hypocalcemia 09/03/2019   Knee pain, right    needs replacement   Neuropathy    feet   Obesity    Seizures (HCC)    x1 - after craniotomy (1960s)   Weakness of both legs    since back surgery   Past Surgical History:  Procedure Laterality Date   APPENDECTOMY  1958   BRAIN SURGERY  1965   CLOSED REDUCTION  CLAVICLE FRACTURE  1955   COLONOSCOPY  02/2008   int hemorrhoids, diverticula (Dr. Jinny)   COLONOSCOPY N/A 08/26/2018   TA, rpt 3 yrs (Vanga, Corinn Skiff)   COLONOSCOPY WITH PROPOFOL  N/A 11/10/2015   diverticulosis, path with microscopic colitis Clair Jinny, MD)   COLONOSCOPY WITH PROPOFOL  N/A 09/26/2021   diverticulosis, rpt 5 yrs (Vanga, Corinn Skiff, MD)   CORONARY ANGIOPLASTY WITH STENT PLACEMENT  2005   stent 2005   DECOMPRESSIVE LUMBAR LAMINECTOMY LEVEL 1  03/2018   herniated L4/5 disc R with free fargment over L4 s/p surgery L4 (Krasinksi)    ESOPHAGOGASTRODUODENOSCOPY N/A 08/26/2018   reactive gastritis with benign biopsies (Vanga, Rohini Reddy)   HERNIA REPAIR  1956   inguinal   JOINT REPLACEMENT  2022   KNEE ARTHROSCOPY  2008   torn meniscus   SPINE SURGERY     SUBDURAL HEMATOMA EVACUATION VIA CRANIOTOMY  1964   hit in helmet by baseball   TOTAL KNEE ARTHROPLASTY Right 11/12/2018   Procedure: TOTAL KNEE ARTHROPLASTY;  Surgeon: Marchia Drivers, MD;  Location: [facility] ORS;  Service: Orthopedics;  Laterality: Right;   Social History:  reports that he has never smoked. He has never used smokeless tobacco. He reports that he does not drink alcohol and does not use drugs.  Allergies  Allergen Reactions   Gabapentin Other (See Comments)    unknown   Bactrim [Sulfamethoxazole-Trimethoprim] Rash    Diffuse drug reaction - maculopapular rash   Penicillins Hives    Has patient had a PCN reaction causing immediate rash, facial/tongue/throat swelling, SOB or lightheadedness with hypotension: Unknown Has patient had a PCN reaction causing severe rash involving mucus membranes or skin necrosis: Unknown Has patient had a PCN reaction that required hospitalization: Unknown Has patient had a PCN reaction occurring within the last 10 years: Unknown If all of the above answers are NO, then may proceed with Cephalosporin use.    Lasix  [Furosemide ] Other (See Comments)    Drops blood pressure and drained potassium and magnesium    Prevnar 20 [Pneumococcal 20-Val Conj Vacc] Rash    Large local reaction at injection site    Family History  Problem Relation Age of Onset   Diabetes Father 61   Coronary artery disease Sister        catheterizations   COPD Brother    Stroke Brother    Hypertension Brother    Cancer Neg Hx     Prior to Admission medications   Medication Sig Start Date End Date Taking? Authorizing Provider  Accu-Chek Softclix Lancets lancets Use as instructed to check blood sugar once a day 12/25/23   [name] Baller,  MD  acyclovir  (ZOVIRAX ) 400 MG tablet TAKE 1 TABLET EVERY DAY 03/16/24   [name] Baller, MD  allopurinol  (ZYLOPRIM ) 100 MG tablet Take 1 tablet (100 mg total) by mouth 2 (two) times daily. 03/17/24   [name] Baller, MD  aspirin  EC 81 MG tablet Take 1 tablet (81 mg total) by mouth daily. 03/08/19   [name] Baller, MD  atenolol  (TENORMIN ) 25 MG tablet Take 1 tablet (25 mg total) by mouth daily. 03/17/24   [name] Baller, MD  azaTHIOprine  (IMURAN ) 50 MG tablet Take 50 mg by mouth daily. 08/23/24   [provider]  Biotin  1000 MCG tablet Take 1,000 mcg by mouth daily.     [provider]  Blood Glucose Monitoring Suppl (ACCU-CHEK GUIDE ME) w/Device KIT Use as instructed to check blood sugar once a day 12/25/23   [name] Baller,  MD  budesonide  (ENTOCORT EC ) 3 MG 24 hr capsule Take 3 capsules (9 mg total) by mouth daily. 08/25/24   [name] Baller, MD  Cholecalciferol  (VITAMIN D3) 25 MCG (1000 UT) CAPS Take 1 capsule (1,000 Units total) by mouth daily. 09/18/22   [name] Baller, MD  Cyanocobalamin  (B-12) 1000 MCG SUBL Place 1 tablet under the tongue once a week. 11/12/23   [name] Baller, MD  glucose blood (ACCU-CHEK GUIDE TEST) test strip Use as instructed to check blood sugar once a day 03/12/24   [name] Baller, MD  hydrALAZINE  (APRESOLINE ) 10 MG tablet Take 1 tablet (10 mg total) by mouth 3 (three) times daily. 08/23/24 09/22/24  Loistine Sober, NP  hydrochlorothiazide  (MICROZIDE ) 12.5 MG capsule Take 1 capsule (12.5 mg total) by mouth daily. 08/23/24 09/22/24  Loistine Sober, NP  insulin  glargine (LANTUS  SOLOSTAR) 100 UNIT/ML Solostar Pen Inject 10 Units into the skin daily. 05/13/24   [name] Baller, MD  Insulin  Pen Needle (PEN NEEDLES) 31G X 5 MM MISC Use as directed to inject insulin  daily 11/12/23   [name] Baller, MD  losartan  (COZAAR ) 100 MG tablet Take 1 tablet (100 mg total) by mouth daily. 08/23/24 09/22/24  Loistine Sober, NP   Magnesium  500 MG TABS Take 500 mg by mouth daily.    [provider]  metFORMIN  (GLUCOPHAGE -XR) 500 MG 24 hr tablet Take 2 tablets (1,000 mg total) by mouth daily with breakfast. 03/17/24   [name] Baller, MD  potassium chloride  (KLOR-CON  M) 10 MEQ tablet Take 1 tablet (10 mEq total) by mouth daily. 08/25/24   [name] Baller, MD  tamsulosin  (FLOMAX ) 0.4 MG CAPS capsule Take 1 capsule (0.4 mg total) by mouth daily. 03/17/24   [name] Baller, MD    Physical Exam: Vitals:   09/20/24 1730 09/20/24 1800 09/20/24 1830 09/20/24 1900  BP: (!) 112/58 108/61 127/60 (!) 117/57  Pulse: 69 71 76 70  Resp: 17 15 (!) 21 15  Temp:      TempSrc:      SpO2: 100% 100% 100% 100%  Weight:      Height:       Physical Exam  Labs on Admission: I have personally reviewed following labs and imaging studies  CBC: Recent Labs  Lab 09/18/24 1446 09/20/24 1543  WBC 4.6 4.8  NEUTROABS 3.2  --   HGB 11.3* 12.7*  HCT 34.0* 37.7*  MCV 98.6 97.4  PLT 168 209   Basic Metabolic Panel: Recent Labs  Lab 09/18/24 1446 09/20/24 1543  NA 139 137  K 4.4 4.2  CL 105 101  CO2 22 23  GLUCOSE 167* 121*  BUN 36* 29*  CREATININE 1.93* 1.70*  CALCIUM  9.1 10.1   GFR: Estimated Creatinine Clearance: 41.2 mL/min (A) (by C-G formula based on SCr of 1.7 mg/dL (H)). Liver Function Tests: Recent Labs  Lab 09/18/24 1446 09/20/24 1543  AST 15 24  ALT 17 19  ALKPHOS 61 70  BILITOT 0.4 0.5  PROT 5.7* 6.6  ALBUMIN 3.7 4.1   No results for input(s): LIPASE, AMYLASE in the last 168 hours. No results for input(s): AMMONIA in the last 168 hours. Coagulation Profile: Recent Labs  Lab 09/20/24 1729  INR 1.1   Cardiac Enzymes: No results for input(s): CKTOTAL, CKMB, CKMBINDEX, TROPONINI in the last 168 hours. BNP (last 3 results) Recent Labs    09/18/24 1446  PROBNP 751.0*   HbA1C: No results for input(s): HGBA1C in the last 72 hours. CBG: No results for input(s):  GLUCAP in the  last 168 hours. Lipid Profile: No results for input(s): CHOL, HDL, LDLCALC, TRIG, CHOLHDL, LDLDIRECT in the last 72 hours. Thyroid  Function Tests: Recent Labs    09/20/24 1543  TSH 4.380   Anemia Panel: No results for input(s): VITAMINB12, FOLATE, FERRITIN, TIBC, IRON, RETICCTPCT in the last 72 hours. Urine analysis:    Component Value Date/Time   COLORURINE YELLOW 08/17/2024 1737   APPEARANCEUR CLEAR 08/17/2024 1737   LABSPEC 1.025 08/17/2024 1737   PHURINE 5.5 08/17/2024 1737   GLUCOSEU NEGATIVE 08/17/2024 1737   HGBUR MODERATE (A) 08/17/2024 1737   BILIRUBINUR NEGATIVE 08/17/2024 1737   BILIRUBINUR negative 11/03/2023 1157   BILIRUBINUR negative 03/18/2023 1515   KETONESUR NEGATIVE 08/17/2024 1737   PROTEINUR >300 (A) 08/17/2024 1737   UROBILINOGEN 0.2 11/03/2023 1157   NITRITE NEGATIVE 08/17/2024 1737   LEUKOCYTESUR NEGATIVE 08/17/2024 1737    Radiological Exams on Admission: DG Chest Port 1 View Result Date: 09/20/2024 EXAM: 1 VIEW XRAY OF THE CHEST 09/20/2024 05:33:00 PM COMPARISON: 09/18/2024 CLINICAL HISTORY: Questionable sepsis - evaluate for abnormality. FINDINGS: LUNGS AND PLEURA: Shallow inspiration. Atelectasis or infiltration in the lung bases, developing since prior study. No pleural effusion. No pneumothorax. HEART AND MEDIASTINUM: Cardiac enlargement. BONES AND SOFT TISSUES: No acute osseous abnormality. IMPRESSION: 1. Atelectasis or infiltration in the lung bases, developing since prior study. 2. Cardiomegaly. Electronically signed by: Elsie Gravely MD 09/20/2024 06:32 PM EST RP Workstation: [id number]   Data Reviewed for HPI: Relevant notes from primary care and specialist visits, past discharge summaries as available in EHR, including Care Everywhere. Prior diagnostic testing as pertinent to current admission diagnoses Updated medications and problem lists for reconciliation ED course, including vitals, labs,  imaging, treatment and response to treatment Triage notes, nursing and pharmacy notes and ED provider's notes Notable results as noted above in HPI      Assessment and Plan: * Hypotension, multifactorial, with syncopal event 09/18/2024 - Chronic diarrhea/multiple antihypertensives/?Adrenal insufficiency -Recent admission for hypertensive emergency 10/21-10/23/2025 -History of chronic steroid use- -Moderate to severe aortic stenosis IV hydration with monitoring for fluid overload Hold antihypertensives and diuretics Fall precautions, orthostatic vitals Follow-up random cortisol level to evaluate for adrenal insufficiency -might consider stress dose steroids Will get cardiology consult to assist with medication management, in the setting of moderate to severe aortic stenosis   Syncopal event 09/18/2024 Multifactorial, hypotension, aortic stenosis  Will get repeat echo Cardiology consult Management of hypertension as previously outlined  Chronic diarrhea secondary to lymphocytic colitis Recent chronic steroid use-budesonide  Currently on azathioprine ,-budesonide  discontinued Last evaluated by GI 09/07/2024  AKI (acute kidney injury) Secondary to ATN related to renal hypoperfusion from hypotension Creatinine 1.7 ( baseline of 1.16 07/2024) IV hydration Monitor renal function and avoid nephrotoxins   Moderate to severe aortic stenosis(on echo 03/2024) Avoid dehydration, hypotension, overhydration Cardiology consulted  Chronic heart failure with preserved ejection fraction (HFpEF, >= 50%) (HCC) Clinically dry Holding BP lowering GDMT in the setting of hypotension (atenolol , hydralazine , losartan , furosemide  and hydrochlorothiazide ) Monitor for fluid overload in view of IV fluid to treat hypotension Daily weights with intake and output monitoring  Diabetes mellitus (HCC) Continue Lantus  Sliding scale coverage  CAD with history of LAD stent(2005) Elevated troponin, but  downtrending from 2 days prior so ACS not suspected Patient denies chest pain and EKG nonacute Getting echocardiogram so will evaluate for segmental wall motion abnormality Continue aspirin  and Crestor  Hold blood pressure lowering GDMT of losartan  and atenolol   Remote history of seizure s/p craniectomy for subdural hematoma No  acute issues Not on AEDs   DVT prophylaxis: Lovenox   Consults: [facility] cardiology  Advance Care Planning:   Code Status: Prior   Family Communication: none***  Disposition Plan: Back to previous home environment  Severity of Illness: The appropriate patient status for this patient is OBSERVATION. Observation status is judged to be reasonable and necessary in order to provide the required intensity of service to ensure the patient's safety. The patient's presenting symptoms, physical exam findings, and initial radiographic and laboratory data in the context of their medical condition is felt to place them at decreased risk for further clinical deterioration. Furthermore, it is anticipated that the patient will be medically stable for discharge from the hospital within 2 midnights of admission.   Author: Delayne LULLA Solian, MD 09/20/2024 8:16 PM  For on call review [email or web address].

## 2024-09-20 NOTE — Progress Notes (Signed)
 PHARMACIST - PHYSICIAN COMMUNICATION  CONCERNING:  Enoxaparin  (Lovenox ) for DVT Prophylaxis    RECOMMENDATION: Patient was prescribed enoxaprin 40mg  q24 hours for VTE prophylaxis.   Filed Weights   09/20/24 1700  Weight: 103.9 kg (229 lb)    Body mass index is 34.82 kg/m.  Estimated Creatinine Clearance: 41.2 mL/min (A) (by C-G formula based on SCr of 1.7 mg/dL (H)).   Based on [facility] policy patient is candidate for enoxaparin  0.5mg /kg TBW SQ every 24 hours based on BMI being >30.  DESCRIPTION: Pharmacy has adjusted enoxaparin  dose per [facility] policy.  Patient is now receiving enoxaparin  52.5 mg every 24 hours    Damien Napoleon, PharmD Clinical Pharmacist  09/20/2024 8:28 PM

## 2024-09-20 NOTE — Progress Notes (Signed)
 Ph: [phone] Fax: [phone]   Patient ID: Thomas Carlson, male    DOB: [birth year], 78 y.o.   MRN: [id number]  This visit was conducted in person.  BP (!) 70/40 (BP Location: Right Arm, Cuff Size: Large)   Pulse 74   Temp 97.9 F (36.6 C) (Oral)   Ht 5' 8 (1.727 m)   Wt 222 lb 8 oz (100.9 kg)   SpO2 96%   BMI 33.83 kg/m   BP Readings from Last 3 Encounters:  09/20/24 (!) 70/40  09/18/24 (!) 158/62  08/24/24 134/84  70/40 on repeat testing   CC: 6 mo f/u visit  Subjective:   HPI: Thomas Carlson is a 78 y.o. male presenting on 09/20/2024 for Hospitalization Follow-up (Pt is accompanied by his wife Thomas Carlson /Pt here for f/u. /Pt states he was in the hospital Saturday (09/18/24))   Hospitalization last month for hypertensive urgency BP up to 200s/100s, diuresed with IV meds at that time with 14 lb weight loss and new BP regimen was atenolol  25mg  daily, losartan  100mg  daily, hydrochlorothiazide  12.5mg  daily, hydralazine  10mg  TID.   ER visit over weekend after syncopal episode at an [employer] football game. This in setting of recent increased diarrhea in known lymphocytic colitis.  Cr rose to 1.93.  He received total 3L of lactated ringers  in the ER.   Unsure if home BP readings have been accurate.   He notes ongoing intermittent dizziness, fatigue.  No chest pain, dyspnea, recurrent syncope. No nausea/vomiting.   No hypoglycemia - am fasting sugar today 107   Last saw Dr Unk 09/07/2024. He has not yet started azathioprine .  He stopped budesonide  9mg  2 wks ago - had been on this about for 3 wks, was taking 3mg  in am and 6mg  at night.  He notes he's run out of hydrochlorothiazide  and is about to run out of hydralazine .   Lab Results  Component Value Date   HGBA1C 6.0 03/10/2024       Relevant past medical, surgical, family and social history reviewed and updated as indicated. Interim medical history since our last visit reviewed. Allergies and medications  reviewed and updated. Outpatient Medications Prior to Visit  Medication Sig Dispense Refill   Accu-Chek Softclix Lancets lancets Use as instructed to check blood sugar once a day 100 each 3   acyclovir  (ZOVIRAX ) 400 MG tablet TAKE 1 TABLET EVERY DAY 90 tablet 3   allopurinol  (ZYLOPRIM ) 100 MG tablet Take 1 tablet (100 mg total) by mouth 2 (two) times daily. 180 tablet 4   aspirin  EC 81 MG tablet Take 1 tablet (81 mg total) by mouth daily.     atenolol  (TENORMIN ) 25 MG tablet Take 1 tablet (25 mg total) by mouth daily. 90 tablet 4   azaTHIOprine  (IMURAN ) 50 MG tablet Take 50 mg by mouth daily.     Biotin  1000 MCG tablet Take 1,000 mcg by mouth daily.      Blood Glucose Monitoring Suppl (ACCU-CHEK GUIDE ME) w/Device KIT Use as instructed to check blood sugar once a day 1 kit 0   Cholecalciferol  (VITAMIN D3) 25 MCG (1000 UT) CAPS Take 1 capsule (1,000 Units total) by mouth daily. 30 capsule    Cyanocobalamin  (B-12) 1000 MCG SUBL Place 1 tablet under the tongue once a week.     glucose blood (ACCU-CHEK GUIDE TEST) test strip Use as instructed to check blood sugar once a day 100 each 2   hydrALAZINE  (APRESOLINE ) 10 MG tablet Take 1  tablet (10 mg total) by mouth 3 (three) times daily. 90 tablet 1   hydrochlorothiazide  (MICROZIDE ) 12.5 MG capsule Take 1 capsule (12.5 mg total) by mouth daily. 30 capsule 1   Insulin  Pen Needle (PEN NEEDLES) 31G X 5 MM MISC Use as directed to inject insulin  daily 300 each 3   losartan  (COZAAR ) 100 MG tablet Take 1 tablet (100 mg total) by mouth daily. 30 tablet 1   Magnesium  500 MG TABS Take 500 mg by mouth daily.     metFORMIN  (GLUCOPHAGE -XR) 500 MG 24 hr tablet Take 2 tablets (1,000 mg total) by mouth daily with breakfast. 180 tablet 4   potassium chloride  (KLOR-CON  M) 10 MEQ tablet Take 1 tablet (10 mEq total) by mouth daily. 90 tablet 3   tamsulosin  (FLOMAX ) 0.4 MG CAPS capsule Take 1 capsule (0.4 mg total) by mouth daily. 90 capsule 4   budesonide  (ENTOCORT EC ) 3  MG 24 hr capsule Take 3 capsules (9 mg total) by mouth daily.     insulin  glargine (LANTUS  SOLOSTAR) 100 UNIT/ML Solostar Pen Inject 10 Units into the skin daily. 15 mL 2   No facility-administered medications prior to visit.     Per HPI unless specifically indicated in ROS section below Review of Systems  Objective:  BP (!) 70/40 (BP Location: Right Arm, Cuff Size: Large)   Pulse 74   Temp 97.9 F (36.6 C) (Oral)   Ht 5' 8 (1.727 m)   Wt 222 lb 8 oz (100.9 kg)   SpO2 96%   BMI 33.83 kg/m   Wt Readings from Last 3 Encounters:  09/20/24 222 lb 8 oz (100.9 kg)  09/18/24 230 lb (104.3 kg)  08/24/24 224 lb 2 oz (101.7 kg)      Physical Exam Vitals and nursing note reviewed.  Constitutional:      Appearance: Normal appearance. He is not ill-appearing.  HENT:     Head: Normocephalic and atraumatic.     Mouth/Throat:     Mouth: Mucous membranes are moist.     Pharynx: Oropharynx is clear. No oropharyngeal exudate or posterior oropharyngeal erythema.  Eyes:     General:        Right eye: No discharge.        Left eye: No discharge.     Conjunctiva/sclera: Conjunctivae normal.     Pupils: Pupils are equal, round, and reactive to light.  Cardiovascular:     Rate and Rhythm: Normal rate and regular rhythm.     Pulses: Normal pulses.     Heart sounds: Murmur heard.  Pulmonary:     Effort: Pulmonary effort is normal. No respiratory distress.     Breath sounds: Normal breath sounds. No wheezing, rhonchi or rales.  Musculoskeletal:     Right lower leg: No edema.     Left lower leg: No edema.  Skin:    General: Skin is warm and dry.     Findings: No rash.  Neurological:     Mental Status: He is alert.  Psychiatric:        Mood and Affect: Mood normal.        Behavior: Behavior normal.       Results for orders placed or performed during the hospital encounter of 09/18/24  Comprehensive metabolic panel   Collection Time: 09/18/24  2:46 PM  Result Value Ref Range    Sodium 139 135 - 145 mmol/L   Potassium 4.4 3.5 - 5.1 mmol/L   Chloride 105 98 - 111  mmol/L   CO2 22 22 - 32 mmol/L   Glucose, Bld 167 (H) 70 - 99 mg/dL   BUN 36 (H) 8 - 23 mg/dL   Creatinine, Ser 8.06 (H) 0.61 - 1.24 mg/dL   Calcium  9.1 8.9 - 10.3 mg/dL   Total Protein 5.7 (L) 6.5 - 8.1 g/dL   Albumin 3.7 3.5 - 5.0 g/dL   AST 15 15 - 41 U/L   ALT 17 0 - 44 U/L   Alkaline Phosphatase 61 38 - 126 U/L   Total Bilirubin 0.4 0.0 - 1.2 mg/dL   GFR, Estimated 35 (L) >60 mL/min   Anion gap 12 5 - 15  Pro Brain natriuretic peptide   Collection Time: 09/18/24  2:46 PM  Result Value Ref Range   Pro Brain Natriuretic Peptide 751.0 (H) <300.0 pg/mL  CBC with Differential   Collection Time: 09/18/24  2:46 PM  Result Value Ref Range   WBC 4.6 4.0 - 10.5 K/uL   RBC 3.45 (L) 4.22 - 5.81 MIL/uL   Hemoglobin 11.3 (L) 13.0 - 17.0 g/dL   HCT 65.9 (L) 60.9 - 47.9 %   MCV 98.6 80.0 - 100.0 fL   MCH 32.8 26.0 - 34.0 pg   MCHC 33.2 30.0 - 36.0 g/dL   RDW 83.4 (H) 88.4 - 84.4 %   Platelets 168 150 - 400 K/uL   nRBC 0.0 0.0 - 0.2 %   Neutrophils Relative % 70 %   Neutro Abs 3.2 1.7 - 7.7 K/uL   Lymphocytes Relative 24 %   Lymphs Abs 1.1 0.7 - 4.0 K/uL   Monocytes Relative 5 %   Monocytes Absolute 0.2 0.1 - 1.0 K/uL   Eosinophils Relative 1 %   Eosinophils Absolute 0.1 0.0 - 0.5 K/uL   Basophils Relative 0 %   Basophils Absolute 0.0 0.0 - 0.1 K/uL   Immature Granulocytes 0 %   Abs Immature Granulocytes 0.01 0.00 - 0.07 K/uL  Troponin T, High Sensitivity   Collection Time: 09/18/24  2:46 PM  Result Value Ref Range   Troponin T High Sensitivity 49 (H) 0 - 19 ng/L  Troponin T, High Sensitivity   Collection Time: 09/18/24  4:39 PM  Result Value Ref Range   Troponin T High Sensitivity 43 (H) 0 - 19 ng/L    Assessment & Plan:   Problem List Items Addressed This Visit     Hypotension - Primary   Marked hypotension in setting of recently stopping budesonide  9mg  daily - ?adrenal insufficiency  component. Will refer to endocrinology for eval adrenal insufficiency.  1 month ago he was in hospital with hypertensive urgency, BP regimen was adjusted to atenolol  25mg  daily, losartan  100mg  daily, hydrochlorothiazide  12.5mg  daily, hydralazine  10mg  TID. He notes he ran out of hydrochlorothiazide  a few days ago and is about to run out of hydralazine . He continues losartan  and atenolol  daily.  Given marked hypotension in office with mild symptoms (lightheaded, fatigued), did recommend return to ER for re evaluation /stabilization with IVF, and consideration for further labwork including creatinine, eval for adrenal insufficiency vs outpatient eval for this.  May need to restart budesonide  to see if this resolves hypotension.       Relevant Orders   Ambulatory referral to Endocrinology     No orders of the defined types were placed in this encounter.   Orders Placed This Encounter  Procedures   Ambulatory referral to Endocrinology    Referral Priority:   Routine    Referral Type:  Consultation    Referral Reason:   Specialty Services Required    Number of Visits Requested:   1    Patient Instructions  I recommend further evaluation at ER today for low blood pressures - you may need further IV fluids and labwork.   Follow up plan: Return if symptoms worsen or fail to improve.  Anton Blas, MD

## 2024-09-20 NOTE — Assessment & Plan Note (Addendum)
 Differential include antihypertensive meds(recent admission for hypertensive emergency) in the setting of recent worsening of chronic diarrhea with possibility of adrenal insufficiency following discontinuation of long-term oral budesonide  a week ago -IV hydration with monitoring for fluid overload -Hold antihypertensives and diuretics -Fall precautions, orthostatic vitals -Follow-up random cortisol level to evaluate for adrenal insufficiency -might consider stress dose steroids, if BP not holding up -Will get cardiology consult to assist with medication management, in the setting of moderate to severe aortic stenosis

## 2024-09-20 NOTE — ED Notes (Signed)
Fall bundle in place

## 2024-09-20 NOTE — Assessment & Plan Note (Addendum)
 Marked hypotension in setting of recently stopping budesonide  9mg  daily - ?adrenal insufficiency component. Will refer to endocrinology for eval adrenal insufficiency.  1 month ago he was in hospital with hypertensive urgency, BP regimen was adjusted to atenolol  25mg  daily, losartan  100mg  daily, hydrochlorothiazide  12.5mg  daily, hydralazine  10mg  TID. He notes he ran out of hydrochlorothiazide  a few days ago and is about to run out of hydralazine . He continues losartan  and atenolol  daily.  Given marked hypotension in office with mild symptoms (lightheaded, fatigued), did recommend return to ER for re evaluation /stabilization with IVF, and consideration for further labwork including creatinine, eval for adrenal insufficiency vs outpatient eval for this.  May need to restart budesonide  to see if this resolves hypotension.

## 2024-09-20 NOTE — Assessment & Plan Note (Signed)
 Secondary to ATN related to renal hypoperfusion from hypotension, GI losses from recent worsening of diarrhea Creatinine 1.7 ( baseline of 1.16 07/2024) IV hydration Monitor renal function and avoid nephrotoxins

## 2024-09-20 NOTE — Assessment & Plan Note (Signed)
 No acute issues Not on AEDs

## 2024-09-20 NOTE — Assessment & Plan Note (Signed)
 Avoid dehydration, hypotension, overhydration Cardiology consulted

## 2024-09-20 NOTE — Patient Instructions (Addendum)
 I recommend further evaluation at ER today for low blood pressures - you may need further IV fluids and labwork.

## 2024-09-20 NOTE — ED Provider Notes (Signed)
 [facility] Provider Note    Event Date/Time   First MD Initiated Contact with Patient 09/20/24 1652     (approximate)   History   Hypotension  Pt to ED via POV from home. Pt had a follow up appointment at PCP and was sent due to low BP. Pt seen on 11/22 for same. Pt endorses dizziness.    HPI Thomas Carlson is a 78 y.o. male PMH severe aortic stenosis, CHF, hypertension, T2DM, CAD, ulcerative colitis, diabetes, hypertension, hyperlipidemia, CKD presents for evaluation of hypotension - Patient has been feeling lightheaded with orthopnea over the past few days. - Denies any focal symptoms.  No infectious symptoms including fever, cough.  Does have chronic diarrhea -No chest pain, leg swelling - See summary of recent visit below.  Went to his primary care provider today and was sent to ED for further eval.  Per chart review, patient was seen in emergency department on 09/18/2024 after having a syncopal episode, presented hypotensive.  Improved with fluid resuscitation.  Workup notable for AKI, very mild troponin elevation that was stable on repeat.  No evidence of CHF exacerbation.  No return of symptoms, patient felt well enough to be discharged from emergency department and follow-up with his primary care provider today.  Appears he did see his primary care provider.  Noted to be hypotensive in the setting of recently having stopped budesonide  9 mg daily.  Question possible adrenal insufficiency, referred to endocrinology.  Does note that a month ago was in the hospital for hypertensive urgency.  Patient has recently been out of his hydrochlorothiazide  and hydralazine , continues losartan  and atenolol  daily.  Sent to ED for further eval.  Per chart review, last echocardiogram 04/19/2024.  EF 55-60%.  Grade 1 diastolic dysfunction.  Moderate to severe AS.     Physical Exam   Triage Vital Signs: ED Triage Vitals [09/20/24 1541]  Encounter Vitals Group     BP  103/62     Girls Systolic BP Percentile      Girls Diastolic BP Percentile      Boys Systolic BP Percentile      Boys Diastolic BP Percentile      Pulse Rate 71     Resp 16     Temp 97.6 F (36.4 C)     Temp Source Oral     SpO2 98 %     Weight      Height      Head Circumference      Peak Flow      Pain Score 0     Pain Loc      Pain Education      Exclude from Growth Chart     Most recent vital signs: Vitals:   09/20/24 1830 09/20/24 1900  BP: 127/60 (!) 117/57  Pulse: 76 70  Resp: (!) 21 15  Temp:    SpO2: 100% 100%     General: Awake, no distress.  CV:  Good peripheral perfusion. RRR, RP 2+, +systolic murmur, no lower extremity edema appreciated Resp:  Normal effort. CTAB Abd:  No distention. Nontender to deep palpation throughout Other:  Awake and alert, mentating well   ED Results / Procedures / Treatments   Labs (all labs ordered are listed, but only abnormal results are displayed) Labs Reviewed  CBC - Abnormal; Notable for the following components:      Result Value   RBC 3.87 (*)    Hemoglobin 12.7 (*)  HCT 37.7 (*)    RDW 16.0 (*)    All other components within normal limits  BASIC METABOLIC PANEL WITH GFR - Abnormal; Notable for the following components:   Glucose, Bld 121 (*)    BUN 29 (*)    Creatinine, Ser 1.70 (*)    GFR, Estimated 41 (*)    All other components within normal limits  TROPONIN T, HIGH SENSITIVITY - Abnormal; Notable for the following components:   Troponin T High Sensitivity 37 (*)    All other components within normal limits  CULTURE, BLOOD (ROUTINE X 2)  CULTURE, BLOOD (ROUTINE X 2)  C DIFFICILE QUICK SCREEN W PCR REFLEX    GASTROINTESTINAL PANEL BY PCR, STOOL (REPLACES STOOL CULTURE)  LACTIC ACID, PLASMA  PROTIME-INR  HEPATIC FUNCTION PANEL  TSH  CORTISOL  LACTIC ACID, PLASMA  URINALYSIS, W/ REFLEX TO CULTURE (INFECTION SUSPECTED)  TROPONIN T, HIGH SENSITIVITY     EKG  Ecg = sinus rhythm, rate 64, no  gross ST elevation or depression, isolated T wave inversion lead III is nonspecific, normal axis, normal intervals.  No clear evidence of ischemia nor arrhythmia on my interpretation.   RADIOLOGY Radiology interpreted by myself and radiology report reviewed.  No acute pathology identified.    PROCEDURES:  Critical Care performed: Yes, see critical care procedure note(s)  .Critical Care  Performed by: Clarine Ozell LABOR, MD Authorized by: Clarine Ozell LABOR, MD   Critical care provider statement:    Critical care time (minutes):  35   Critical care time was exclusive of:  Separately billable procedures and treating other patients   Critical care was necessary to treat or prevent imminent or life-threatening deterioration of the following conditions:  Shock   Critical care was time spent personally by me on the following activities:  Development of treatment plan with patient or surrogate, discussions with consultants, evaluation of patient's response to treatment, examination of patient, ordering and review of laboratory studies, ordering and review of radiographic studies, ordering and performing treatments and interventions, pulse oximetry, re-evaluation of patient's condition and review of old charts   I assumed direction of critical care for this patient from another provider in my specialty: no     Care discussed with: admitting provider      MEDICATIONS ORDERED IN ED: Medications  sodium chloride  0.9 % bolus 1,000 mL (0 mLs Intravenous Stopped 09/20/24 1802)     IMPRESSION / MDM / ASSESSMENT AND PLAN / ED COURSE  I reviewed the triage vital signs and the nursing notes.                              DDX/MDM/AP: Differential diagnosis includes, but is not limited to, aortic stenosis, consider possibility of adrenal insufficiency, dehydration, doubt acute anemia given no related symptoms, consider underlying infection though no obvious localizing symptoms on review of symptoms.   Consider ACS.  Do not clinically suspect PE with no associated symptomatology.  Plan: - Labs - IV fluid - EKG - Cardiac monitor - CXR - Reassess, anticipate admission, patient amenable - Pressors as needed, steroids as needed --responding well to initial fluid resuscitation here, will defer at this time but continue to monitor  Patient's presentation is most consistent with acute presentation with potential threat to life or bodily function.  The patient is on the cardiac monitor to evaluate for evidence of arrhythmia and/or significant heart rate changes.  ED course below.  Laboratory  workup unremarkable.  AKI improving, mild troponin also improving from prior.  Blood pressure normalized after single liter IV fluid.  Admitted to hospital service for further workup of unspecified episodes of hypotension.  Did not require pressors or steroids here in emergency department.  Clinical Course as of 09/20/24 1928  Mon Sep 20, 2024  1652 BMP with improved creatinine compared to yesterday though persistent AKI from baseline [MM]  1653 CBC with no leukocytosis, improved/stable anemia [MM]  1743 BP(!): 112/58 [MM]  1804 Lactate wnl [MM]  1858 Troponin mildly elevated, downtrending from prior, no anginal complaints, will continue to monitor [MM]  1858 TSH wnl [MM]  1859 LFTs wnl [MM]  1859 Hospitalist consult order placed [MM]  1900 CXR: IMPRESSION: 1. Atelectasis or infiltration in the lung bases, developing since prior study. 2. Cardiomegaly.   [MM]    Clinical Course User Index [MM] Clarine Ozell LABOR, MD     FINAL CLINICAL IMPRESSION(S) / ED DIAGNOSES   Final diagnoses:  Hypotension, unspecified hypotension type  Lightheadedness  AKI (acute kidney injury)     Rx / DC Orders   ED Discharge Orders     None        Note:  This document was prepared using Dragon voice recognition software and may include unintentional dictation errors.   Clarine Ozell LABOR, MD 09/20/24  [phone]

## 2024-09-20 NOTE — ED Notes (Signed)
 Pt's wife to nurses station reporting pt if feeling worse. Repeat vitals obtained and pt hypotensive. Charge RN called and taken to room 3

## 2024-09-20 NOTE — Assessment & Plan Note (Addendum)
 Recent chronic steroid use-budesonide  Currently on azathioprine ,-budesonide  discontinued Last evaluated by GI 09/07/2024

## 2024-09-20 NOTE — ED Notes (Signed)
 CCMD notified of cardiac monitoring order.

## 2024-09-21 DIAGNOSIS — R55 Syncope and collapse: Secondary | ICD-10-CM | POA: Diagnosis not present

## 2024-09-21 DIAGNOSIS — N179 Acute kidney failure, unspecified: Secondary | ICD-10-CM

## 2024-09-21 DIAGNOSIS — I2510 Atherosclerotic heart disease of native coronary artery without angina pectoris: Secondary | ICD-10-CM | POA: Diagnosis not present

## 2024-09-21 DIAGNOSIS — I350 Nonrheumatic aortic (valve) stenosis: Secondary | ICD-10-CM | POA: Diagnosis not present

## 2024-09-21 DIAGNOSIS — I952 Hypotension due to drugs: Secondary | ICD-10-CM

## 2024-09-21 DIAGNOSIS — I959 Hypotension, unspecified: Secondary | ICD-10-CM | POA: Diagnosis not present

## 2024-09-21 DIAGNOSIS — I5031 Acute diastolic (congestive) heart failure: Secondary | ICD-10-CM

## 2024-09-21 LAB — BASIC METABOLIC PANEL WITH GFR
Anion gap: 13 (ref 5–15)
BUN: 29 mg/dL — ABNORMAL HIGH (ref 8–23)
CO2: 19 mmol/L — ABNORMAL LOW (ref 22–32)
Calcium: 9 mg/dL (ref 8.9–10.3)
Chloride: 107 mmol/L (ref 98–111)
Creatinine, Ser: 1.44 mg/dL — ABNORMAL HIGH (ref 0.61–1.24)
GFR, Estimated: 49 mL/min — ABNORMAL LOW (ref >60)
Glucose, Bld: 92 mg/dL (ref 70–99)
Potassium: 3.8 mmol/L (ref 3.5–5.1)
Sodium: 139 mmol/L (ref 135–145)

## 2024-09-21 LAB — CBC
HCT: 33.3 % — ABNORMAL LOW (ref 39.0–52.0)
Hemoglobin: 11.3 g/dL — ABNORMAL LOW (ref 13.0–17.0)
MCH: 33 pg (ref 26.0–34.0)
MCHC: 33.9 g/dL (ref 30.0–36.0)
MCV: 97.4 fL (ref 80.0–100.0)
Platelets: 178 K/uL (ref 150–400)
RBC: 3.42 MIL/uL — ABNORMAL LOW (ref 4.22–5.81)
RDW: 16.3 % — ABNORMAL HIGH (ref 11.5–15.5)
WBC: 4.3 K/uL (ref 4.0–10.5)
nRBC: 0 % (ref 0.0–0.2)

## 2024-09-21 LAB — LACTIC ACID, PLASMA: Lactic Acid, Venous: 1 mmol/L (ref 0.5–1.9)

## 2024-09-21 LAB — TROPONIN T, HIGH SENSITIVITY: Troponin T High Sensitivity: 40 ng/L — ABNORMAL HIGH (ref 0–19)

## 2024-09-21 LAB — CBG MONITORING, ED
Glucose-Capillary: 81 mg/dL (ref 70–99)
Glucose-Capillary: 119 mg/dL — ABNORMAL HIGH (ref 70–99)
Glucose-Capillary: 91 mg/dL (ref 70–99)

## 2024-09-21 LAB — IRON AND TIBC
Iron: 138 ug/dL (ref 45–182)
Saturation Ratios: 52 % — ABNORMAL HIGH (ref 17.9–39.5)
TIBC: 266 ug/dL (ref 250–450)
UIBC: 128 ug/dL

## 2024-09-21 LAB — PHOSPHORUS: Phosphorus: 3.6 mg/dL (ref 2.5–4.6)

## 2024-09-21 LAB — GLUCOSE, CAPILLARY: Glucose-Capillary: 87 mg/dL (ref 70–99)

## 2024-09-21 LAB — MAGNESIUM: Magnesium: 1.7 mg/dL (ref 1.7–2.4)

## 2024-09-21 MED ORDER — SODIUM BICARBONATE 650 MG PO TABS
650.0000 mg | ORAL_TABLET | Freq: Three times a day (TID) | ORAL | Status: DC
  Administered 2024-09-21 – 2024-09-22 (×4): 650 mg via ORAL
  Filled 2024-09-21 (×4): qty 1

## 2024-09-21 MED ORDER — SODIUM CHLORIDE 0.9 % IV SOLN: INTRAVENOUS | Status: AC

## 2024-09-21 NOTE — Progress Notes (Signed)
 [facility] Progress Note  Patient: Thomas Carlson    FMW:[id number]  DOA: 09/20/2024     Date of Service: the patient was seen and examined on 09/21/2024  Chief Complaint  Patient presents with   Hypotension   Brief hospital course: Thomas Carlson is a 78 y.o. male with medical history significant for HFpEF, HTN, insulin -dependent type 2 DM, CAD s/p LAD stent, aortic stenosis(moderate to severe, 03/2024), chronic  Diarrhea secondary to lymphocytic colitis, remote seizure in the setting of subdural hematoma , with history of hospitalization a month ago for hypertensive emergency being admitted today for protracted hypotension multifactorial etiology resulting in a syncopal episode 2 days prior..  Patient was actually seen in the ED 2 days ago with a syncopal episode while at a ball game and was noted to be markedly hypotensive on arrival with an associated AKI.  He improved with IV fluids and after shared decision making, he was discharged home to follow-up with PCP.  He followed up with PCP today who found him to be hypotensive in spite of cutting back on his antihypertensives(atenolol , furosemide , losartan ) since his ED visit on 11/22.  He feels lightheaded but has had no further syncopal episodes.  He denies chest pain.  Has chronic dyspnea on exertion, not worse than baseline. Of note, patient was seen by GI a couple weeks prior (09/07/2024), for recent flareup of his diarrhea related to his lymphocytic colitis, at the time having up to 10 BMs daily including nocturnal episodes.  During that visit, he was started on azathioprine  to replace his long-term oral budesonide . Upon arrival in the ED, BP as low as 65/41, improving with IV fluid resuscitation to 117/57 by admission.  Other vitals were within normal limits. Labs were notable for troponin of 37 (down from 49->47 a couple days prior) Creatinine 1.7 (down from 1.93 on 11/22 but up from baseline of 1.16 a month ago) WBC normal at 4.8  with normal lactic acid of 1.3 Hemoglobin 12.7 (near baseline) Hepatic function panel and TSH normal.  Random cortisol, urinalysis and GI panel stool pending EKG showed sinus at 64 with LVH Chest x-ray showed atelectasis or infiltration of lung bases developing since prior study on 09/18/2024   Patient treated with a 2 L NS bolus Admission requested    Assessment and Plan:  # Hypotension, multifactorial, with syncopal event 09/18/2024 Differential include antihypertensive meds(recent admission for hypertensive emergency) in the setting of recent worsening of chronic diarrhea with possibility of adrenal insufficiency following discontinuation of long-term oral budesonide  a week ago -IV hydration with monitoring for fluid overload -Hold antihypertensives and diuretics -Fall precautions, orthostatic vitals -random cortisol level wnl, No adrenal insufficiency  - Cardiology consulted, recommended to resume losartan  at lower dose on discharge if blood pressure remains stable.  2D echocardiogram to check aortic valve stenosis, if it is worsening then patient may need cardiac cath. Cardiac monitor on discharge to check arrhythmia Follow cards for further recommendation    # Syncopal event 09/18/2024 Multifactorial, hypotension, aortic stenosis  F/u repeat echo Cardiology consulted   # Lymphocytic/ulcerative colitis with chronic diarrhea  On Chronic immunosuppressive medications  Currently on azathioprine ,-budesonide  discontinued a week ago Patient with recent acute worsening of diarrhea, in spite of initial improvement with switching immunosuppressants from oral budesonide  to azathioprine  Patient states he usually stays well-hydrated as he has had this condition for several years Last evaluated by GI 09/07/2024   # AKI (acute kidney injury) Secondary to ATN related to renal hypoperfusion  from hypotension, GI losses from recent worsening of diarrhea Creatinine 1.7 ( baseline of 1.16  07/2024) IV hydration Monitor renal function and avoid nephrotoxins 11/25 CO2 19, mild metabolic acidosis.  Started oral bicarbonate sCr 1.7>> 1.44 improving Check BMP daily   # Moderate to severe aortic stenosis(on echo 03/2024) Avoid dehydration, hypotension, overhydration Cardiology consulted   # Chronic heart failure with preserved ejection fraction (HFpEF, >= 50%) Clinically euvolemic Holding BP lowering GDMT in the setting of hypotension (atenolol , hydralazine , losartan , furosemide  and hydrochlorothiazide ) Monitor for fluid overload in view of IV fluid to treat hypotension Daily weights with intake and output monitoring   # Diabetes mellitus (HCC) Continue Lantus  Sliding scale coverage   # CAD with history of LAD stent(2005) Elevated troponin, but downtrending from 2 days prior so ACS not suspected Patient denies chest pain and EKG nonacute Getting echocardiogram so will evaluate for segmental wall motion abnormality Continue aspirin  and Crestor  Hold blood pressure lowering GDMT of losartan  and atenolol    # Remote history of seizure s/p craniectomy for subdural hematoma No acute issues Not on AEDs   # 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  Interventions:  Diet: Heart healthy/carb modified diet DVT Prophylaxis: Subcutaneous Lovenox    Advance goals of care discussion: Full code  Family Communication: family was present at bedside, at the time of interview.  The pt provided permission to discuss medical plan with the family. Opportunity was given to ask question and all questions were answered satisfactorily.   Disposition:  Pt is from home, admitted with hypotension, still has low blood pressure, which precludes a safe discharge. Discharge to home, when stable and cleared by cardiology.  Subjective: No significant events overnight, currently patient is asymptomatic, denies any headache or dizziness, no  chest pain or palpitation, no shortness of breath.  Physical Exam: General: NAD, lying comfortably Appear in no distress, affect appropriate Eyes: PERRLA ENT: Oral Mucosa Clear, moist  Neck: no JVD,  Cardiovascular: S1 and S2 Present, audible murmur due to AS Respiratory: good respiratory effort, Bilateral Air entry equal and Decreased, no Crackles, no wheezes Abdomen: Bowel Sound present, Soft and no tenderness,  Skin: no rashes Extremities: no Pedal edema, no calf tenderness Neurologic: without any new focal findings Gait not checked due to patient safety concerns  Vitals:   09/21/24 0250 09/21/24 0445 09/21/24 0833 09/21/24 1150  BP:  112/63 135/66 126/71  Pulse:  77 77 90  Resp: 20 20 16 18   Temp: 98.1 F (36.7 C)  97.7 F (36.5 C) 97.7 F (36.5 C)  TempSrc: Oral  Oral Oral  SpO2:  97% 97% 96%  Weight:      Height:       No intake or output data in the 24 hours ending 09/21/24 1440 Filed Weights   09/20/24 1700  Weight: 103.9 kg    Data Reviewed: I have personally reviewed and interpreted daily labs, tele strips, imagings as discussed above. I reviewed all nursing notes, pharmacy notes, vitals, pertinent old records I have discussed plan of care as described above with RN and patient/family.  CBC: Recent Labs  Lab 09/18/24 1446 09/20/24 1543 09/21/24 0303  WBC 4.6 4.8 4.3  NEUTROABS 3.2  --   --   HGB 11.3* 12.7* 11.3*  HCT 34.0* 37.7* 33.3*  MCV 98.6 97.4 97.4  PLT 168 209 178   Basic Metabolic Panel: Recent Labs  Lab 09/18/24 1446 09/20/24 1543 09/21/24 0303  NA 139 137 139  K 4.4 4.2 3.8  CL 105 101 107  CO2 22 23 19*  GLUCOSE 167* 121* 92  BUN 36* 29* 29*  CREATININE 1.93* 1.70* 1.44*  CALCIUM  9.1 10.1 9.0  MG  --   --  1.7  PHOS  --   --  3.6    Studies: DG Chest Port 1 View Result Date: 09/20/2024 EXAM: 1 VIEW XRAY OF THE CHEST 09/20/2024 05:33:00 PM COMPARISON: 09/18/2024 CLINICAL HISTORY: Questionable sepsis - evaluate for  abnormality. FINDINGS: LUNGS AND PLEURA: Shallow inspiration. Atelectasis or infiltration in the lung bases, developing since prior study. No pleural effusion. No pneumothorax. HEART AND MEDIASTINUM: Cardiac enlargement. BONES AND SOFT TISSUES: No acute osseous abnormality. IMPRESSION: 1. Atelectasis or infiltration in the lung bases, developing since prior study. 2. Cardiomegaly. Electronically signed by: Elsie Gravely MD 09/20/2024 06:32 PM EST RP Workstation: [id number]    Scheduled Meds:  aspirin  EC  81 mg Oral Daily   azaTHIOprine   50 mg Oral Daily   enoxaparin  (LOVENOX ) injection  0.5 mg/kg Subcutaneous Q24H   insulin  aspart  0-5 Units Subcutaneous QHS   insulin  aspart  0-9 Units Subcutaneous TID WC   insulin  glargine-yfgn  10 Units Subcutaneous Daily   sodium bicarbonate   650 mg Oral TID   sodium chloride  flush  3 mL Intravenous Q12H   tamsulosin   0.4 mg Oral Daily   Continuous Infusions:  sodium chloride  75 mL/hr at 09/21/24 1010   PRN Meds: acetaminophen  **OR** acetaminophen , HYDROcodone -acetaminophen , ondansetron  **OR** ondansetron  (ZOFRAN ) IV  Time spent: 35 minutes  Author: ELVAN SOR. MD [facility] 09/21/2024 2:40 PM  To reach On-call, see care teams to locate the attending and reach out to them via [email or web address]. If 7PM-7AM, please contact night-coverage If you still have difficulty reaching the attending provider, please page the [facility] (Director on Call) for [facility] on amion for assistance.

## 2024-09-21 NOTE — Plan of Care (Signed)
   Problem: Nutritional: Goal: Maintenance of adequate nutrition will improve Outcome: Progressing

## 2024-09-21 NOTE — Consult Note (Signed)
 Cardiology Consultation   Patient ID: Thomas Carlson MRN: [id number]; DOB: [birth year]  Admit date: 09/20/2024 Date of Consult: 09/21/2024  PCP:  Thomas Baller, MD   [facility] Providers Cardiologist:  Thomas Hanson, MD        Patient Profile: Thomas Carlson is a 78 y.o. male with a hx of aortic stenosis and regurgitation, CAD with PCI to the proximal LAD 2005, hypertension, hyperlipidemia, type 2 diabetes, and remote seizure in the setting of subdermal hematoma who is being seen 09/21/2024 for the evaluation of hypotension/syncope at the request of Dr. Von.  History of Present Illness:   [name] was previously followed at [facility] clinic by Dr. Bosie.  He had LHC in 04/2004 with high-grade stenosis of the proximal s/p PCI/DES.  Echo 09/2022 showed EF 50 to 55% with mild LVH, G1 DD, mild MR, and severely calcified aortic valve with mild regurgitation and moderate stenosis.  He established with Dr. Hanson 11/2022 after referral from his PCP with these echo findings.  Echo 03/2024 revealed EF 55 to 60%, G1 DD, mild MR, mild AI, and moderate to severe aortic valve stenosis with VTI 1.69 cm and mean gradient 30 mmHg.    Patient was seen in the GI clinic for routine visit 08/17/2024 where his blood pressure was noted to be 208/74.  He was sent to urgent care for further evaluation where he reported taking his medications as prescribed that morning.  BP in urgent care was 218/82 and he was noted to have lower extremity swelling.  Chest x-ray showed trace bilateral pleural effusions.  He was subsequently transferred to the emergency department for further evaluation.  BP in the ED was 214/93.  Pro BNP 1134.  Troponin minimally elevated and flat trending at 33 greater than 36.  EKG without acute ischemic changes.  He was given hydralazine  20 mg IV and diuresed with 40 mg IV Lasix .  BP improved and he was discharged with new prescriptions for hydralazine  and hydrochlorothiazide .   These were in addition to PTA losartan  and atenolol .  Patient reports diarrhea which has been ongoing for several months and for which she follows with GI.  This had briefly resolved but he had recurrence this past Friday 11/21 with significant diarrhea.  Patient reports this past Saturday 11/22 he was attending a college football game where he was feeling a little dizzy. He reports feeling a little better but had difficulty focusing on the game. He then suddenly passed out.  He denies prodromal symptoms.  He does not believe he was unconscious for more than a few seconds but he is not sure. He reported to the ED at that time where he was found to be hypotensive with BP 84/36.  Labs were consistent with dehydration with BUN 36 and creatinine 1.93.  proBNP 751.  Troponin mildly elevated at 49.  EKG without acute ischemic changes.  No pleural effusion evident on chest x-ray. He was given IV fluids with normalization of BP.  He was able to ambulate with nursing staff and was subsequently discharged from the ED. He had follow up with his PCP yesterday and his BP was noted to be 70/40. It was noted that he had run out of hydralazine  and had stopped taking this a few days prior. He continued to take losartan , atenolol , and hydrochlorothiazide .  He was referred to the ED for further evaluation.  In the ED, BP 103/62 with otherwise normal vital signs.  Lab work showed persistent AKI, although improved  from prior with BUN 29 and creatinine 1.7.  Troponin minimally elevated at 37.  EKG shows sinus rhythm without acute ischemic changes.  Chest x-ray showed atelectasis or infiltration in the lung bases, developing since prior study.  Patient was treated with 2 L normal saline bolus.  Cardiology was asked to consult for further evaluation of hypotension and syncope.  At time of cardiology consult, patient reports overall feeling well without recurrence of lightheadedness/dizziness.  He has been able to ambulate the hallways  without issue.  He denies chest pain, palpitations, and lower extremity swelling.  Past Medical History:  Diagnosis Date   Acute posthemorrhagic anemia    Allergy    Aortic valve sclerosis 08/13/2016   Sclerosis without stenosis by US  (08/2016)   CAD (coronary artery disease) 2005   s/p stent (Fath)   Cancer (HCC) 3/25   Cholelithiasis    by CT   Chronic kidney disease, stage 3, mod decreased GFR (HCC)    Diabetes mellitus (HCC) 2010   Diverticulosis    by CT   DJD (degenerative joint disease)    knee   Encephalomalacia without cerebral infarction 03/08/2019   Remote anterior frontal and central MCA encephalomalacia after remote trauma noted on MRI 2020 Ardath)   Genital herpes    Gout    Heart murmur    followed by PCP   HLD (hyperlipidemia)    HTN (hypertension)    Hypocalcemia 09/03/2019   Knee pain, right    needs replacement   Neuropathy    feet   Obesity    Seizures (HCC)    x1 - after craniotomy (1960s)   Weakness of both legs    since back surgery    Past Surgical History:  Procedure Laterality Date   APPENDECTOMY  1958   BRAIN SURGERY  1965   CLOSED REDUCTION CLAVICLE FRACTURE  1955   COLONOSCOPY  02/2008   int hemorrhoids, diverticula (Dr. Jinny)   COLONOSCOPY N/A 08/26/2018   TA, rpt 3 yrs (Vanga, Topanga)   COLONOSCOPY WITH PROPOFOL  N/A 11/10/2015   diverticulosis, path with microscopic colitis Clair Jinny, MD)   COLONOSCOPY WITH PROPOFOL  N/A 09/26/2021   diverticulosis, rpt 5 yrs (Vanga, Corinn Skiff, MD)   CORONARY ANGIOPLASTY WITH STENT PLACEMENT  2005   stent 2005   DECOMPRESSIVE LUMBAR LAMINECTOMY LEVEL 1  03/2018   herniated L4/5 disc R with free fargment over L4 s/p surgery L4 (Krasinksi)   ESOPHAGOGASTRODUODENOSCOPY N/A 08/26/2018   reactive gastritis with benign biopsies (Vanga, Rohini Reddy)   HERNIA REPAIR  1956   inguinal   JOINT REPLACEMENT  2022   KNEE ARTHROSCOPY  2008   torn meniscus   SPINE SURGERY     SUBDURAL  HEMATOMA EVACUATION VIA CRANIOTOMY  1964   hit in helmet by baseball   TOTAL KNEE ARTHROPLASTY Right 11/12/2018   Procedure: TOTAL KNEE ARTHROPLASTY;  Surgeon: Marchia Drivers, MD;  Location: [facility] ORS;  Service: Orthopedics;  Laterality: Right;       Scheduled Meds:  aspirin  EC  81 mg Oral Daily   azaTHIOprine   50 mg Oral Daily   enoxaparin  (LOVENOX ) injection  0.5 mg/kg Subcutaneous Q24H   insulin  aspart  0-5 Units Subcutaneous QHS   insulin  aspart  0-9 Units Subcutaneous TID WC   insulin  glargine-yfgn  10 Units Subcutaneous Daily   sodium chloride  flush  3 mL Intravenous Q12H   tamsulosin   0.4 mg Oral Daily   Continuous Infusions:  sodium chloride  75 mL/hr at 09/20/24  2114   PRN Meds: acetaminophen  **OR** acetaminophen , HYDROcodone -acetaminophen , ondansetron  **OR** ondansetron  (ZOFRAN ) IV  Allergies:    Allergies  Allergen Reactions   Gabapentin Other (See Comments)    unknown   Bactrim [Sulfamethoxazole-Trimethoprim] Rash    Diffuse drug reaction - maculopapular rash   Penicillins Hives    Has patient had a PCN reaction causing immediate rash, facial/tongue/throat swelling, SOB or lightheadedness with hypotension: Unknown Has patient had a PCN reaction causing severe rash involving mucus membranes or skin necrosis: Unknown Has patient had a PCN reaction that required hospitalization: Unknown Has patient had a PCN reaction occurring within the last 10 years: Unknown If all of the above answers are NO, then may proceed with Cephalosporin use.    Lasix  [Furosemide ] Other (See Comments)    Drops blood pressure and drained potassium and magnesium    Prevnar 20 [Pneumococcal 20-Val Conj Vacc] Rash    Large local reaction at injection site    Social History:   Social History   Socioeconomic History   Marital status: Married    Spouse name: Not on file   Number of children: Not on file   Years of education: Not on file   Highest education level: Master's degree (e.g.,  MA, MS, MEng, MEd, MSW, MBA)  Occupational History   Not on file  Tobacco Use   Smoking status: Never   Smokeless tobacco: Never  Vaping Use   Vaping status: Never Used  Substance and Sexual Activity   Alcohol use: No   Drug use: No   Sexual activity: Not Currently  Other Topics Concern   Not on file  Social History Narrative   Caffeine: 2 cans of diet soda/day   Lives with wife.  1 dog at home.  2 grown children   Occupation: multimedia programmer at [employer]   Edu: Master's degree   Activity: walking some (fit bit)   Diet: good water , fruits/vegetables occasionally, cutting back on carbs   Social Drivers of Health   Financial Resource Strain: Low Risk  (08/22/2024)   Overall Financial Resource Strain (CARDIA)    Difficulty of Paying Living Expenses: Not very hard  Food Insecurity: No Food Insecurity (08/22/2024)   Hunger Vital Sign    Worried About Running Out of Food in the Last Year: Never true    Ran Out of Food in the Last Year: Never true  Transportation Needs: No Transportation Needs (08/22/2024)   PRAPARE - Administrator, Civil Service (Medical): No    Lack of Transportation (Non-Medical): No  Physical Activity: Unknown (08/22/2024)   Exercise Vital Sign    Days of Exercise per Week: Patient declined    Minutes of Exercise per Session: Not on file  Stress: Stress Concern Present (08/22/2024)   [employer] of Occupational Health - Occupational Stress Questionnaire    Feeling of Stress: To some extent  Social Connections: Moderately Isolated (08/22/2024)   Social Connection and Isolation Panel    Frequency of Communication with Friends and Family: More than three times a week    Frequency of Social Gatherings with Friends and Family: Once a week    Attends Religious Services: Patient declined    Database Administrator or Organizations: No    Attends Banker Meetings: Not on file    Marital Status: Married  Intimate Partner  Violence: Not At Risk (08/20/2024)   Humiliation, Afraid, Rape, and Kick questionnaire    Fear of Current or Ex-Partner: No    Emotionally  Abused: No    Physically Abused: No    Sexually Abused: No    Family History:    Family History  Problem Relation Age of Onset   Diabetes Father 72   Coronary artery disease Sister        catheterizations   COPD Brother    Stroke Brother    Hypertension Brother    Cancer Neg Hx      ROS:  Please see the history of present illness.   All other ROS reviewed and negative.     Physical Exam/Data: Vitals:   09/20/24 2230 09/21/24 0130 09/21/24 0250 09/21/24 0445  BP: (!) 105/51 126/61  112/63  Pulse: 78 71  77  Resp: 15 18 20 20   Temp: 97.8 F (36.6 C)  98.1 F (36.7 C)   TempSrc: Oral  Oral   SpO2: 99% 97%  97%  Weight:      Height:       No intake or output data in the 24 hours ending 09/21/24 0733    09/20/2024    5:00 PM 09/20/2024    2:37 PM 09/18/2024    2:37 PM  Last 3 Weights  Weight (lbs) 229 lb 222 lb 8 oz 230 lb  Weight (kg) 103.874 kg 100.925 kg 104.327 kg     Body mass index is 34.82 kg/m.  General:  Well nourished, well developed, in no acute distress HEENT: normal Neck: no JVD Vascular: No carotid bruits; Distal pulses 2+ bilaterally Cardiac:  normal S1, S2; RRR; II/VI systolic murmur  Lungs:  clear to auscultation bilaterally, no wheezing, rhonchi or rales  Abd: soft, nontender, no hepatomegaly  Ext: no edema Skin: warm and dry  Psych:  Normal affect   EKG:  The EKG was personally reviewed and demonstrates: Sinus rhythm with minimal criteria for LVH, rate 67 bpm Telemetry:  Telemetry was personally reviewed and demonstrates: Sinus rhythm with occasional PVCs  Relevant CV Studies:  03/2024 Echo complete 1. Left ventricular ejection fraction, by estimation, is 55 to 60%. Left  ventricular ejection fraction by 2D MOD biplane is 55.7 %. The left  ventricle has normal function. The left ventricle has no  regional wall  motion abnormalities. Left ventricular  diastolic parameters are consistent with Grade I diastolic dysfunction  (impaired relaxation).   2. Right ventricular systolic function is normal. The right ventricular  size is normal.   3. The mitral valve is normal in structure. Mild mitral valve  regurgitation. No evidence of mitral stenosis.   4. The aortic valve is normal in structure. Aortic valve regurgitation is  mild. Moderate to severe aortic valve stenosis. Aortic valve area, by VTI  measures 1.69 cm. Aortic valve mean gradient measures 30.0 mmHg. Aortic  valve Vmax measures 3.67 m/s.   5. The inferior vena cava is normal in size with greater than 50%  respiratory variability, suggesting right atrial pressure of 3 mmHg.   Laboratory Data: High Sensitivity Troponin:  No results for input(s): TROPONINIHS in the last 720 hours.   Chemistry Recent Labs  Lab 09/18/24 1446 09/20/24 1543 09/21/24 0303  NA 139 137 139  K 4.4 4.2 3.8  CL 105 101 107  CO2 22 23 19*  GLUCOSE 167* 121* 92  BUN 36* 29* 29*  CREATININE 1.93* 1.70* 1.44*  CALCIUM  9.1 10.1 9.0  GFRNONAA 35* 41* 49*  ANIONGAP 12 13 13     Recent Labs  Lab 09/18/24 1446 09/20/24 1543  PROT 5.7* 6.6  ALBUMIN 3.7 4.1  AST 15 24  ALT 17 19  ALKPHOS 61 70  BILITOT 0.4 0.5   Lipids No results for input(s): CHOL, TRIG, HDL, LABVLDL, LDLCALC, CHOLHDL in the last 168 hours.  Hematology Recent Labs  Lab 09/18/24 1446 09/20/24 1543 09/21/24 0303  WBC 4.6 4.8 4.3  RBC 3.45* 3.87* 3.42*  HGB 11.3* 12.7* 11.3*  HCT 34.0* 37.7* 33.3*  MCV 98.6 97.4 97.4  MCH 32.8 32.8 33.0  MCHC 33.2 33.7 33.9  RDW 16.5* 16.0* 16.3*  PLT 168 209 178   Thyroid   Recent Labs  Lab 09/20/24 1543  TSH 4.380    BNP Recent Labs  Lab 09/18/24 1446  PROBNP 751.0*    DDimer No results for input(s): DDIMER in the last 168 hours.  Radiology/Studies:  [facility] Chest Port 1 View Result Date:  09/20/2024 IMPRESSION: 1. Atelectasis or infiltration in the lung bases, developing since prior study. 2. Cardiomegaly. Electronically signed by: Elsie Gravely MD 09/20/2024 06:32 PM EST RP Workstation: [id number]   DG Chest Port 1 View Result Date: 09/18/2024 IMPRESSION: No acute cardiopulmonary abnormality. Electronically Signed   By: Corean Salter M.D.   On: 09/18/2024 15:17   Assessment and Plan:  Hypotension Syncope - Patient was seen in the emergency room last month for hypertensive urgency where he was diuresed and started on hydralazine  and hydrochlorothiazide  in addition to his PTA losartan  and atenolol  - Patient had an episode of lightheadedness/dizziness followed by syncope on 11/21 after which she was evaluated in the emergency department and found to have AKI and hypotension which improved with IV fluids - Evaluated by PCP yesterday where he was found to be significantly hypotensive with systolic blood pressure of 70 and was referred to the emergency department for further evaluation - Patient received IV fluids in the ED with improvement in blood pressure - Suspect overdiuresis and over medication of hypertension contributed to syncope - Continue to hold antihypertensives, although would be hesitant to discontinue all of his medications on discharge.  Consider resuming losartan  at reduced dose. - Recommend obtaining echocardiogram - Recommend ZIO monitor on discharge to evaluate for arrhythmia  Aortic valve stenosis - Most recent echo 03/2024 with moderate to severe aortic valve stenosis, VTI 1.69 cm and mean gradient 30 mmHg - Recommend obtaining updated echo as above - If aortic valve disease has progressed, may need to consider proceeding with cardiac catheterization  HFpEF - Most recent echo 03/2024 with preserved LV systolic function and G1 DD - Patient appears euvolemic on exam.  No acute exacerbation. - Lab work consistent with dehydration, continue to hold off  on diuresis  AKI - Suspect overdiuresis from ED visit in October contributed - Creatinine 1.44 and BUN 29, renal function overall improved since recent ED visit 11/24 - Continue to avoid nephrotoxic agents  Coronary artery disease - No chest pain - Troponin minimally elevated, likely demand ischemia - No indication for ischemic evaluation at this time - Continue aspirin  and statin therapy    For questions or updates, please contact [facility] Please consult [email or web address] for contact info under      Signed, Lesley LITTIE Maffucci, PA-C  09/21/2024 7:33 AM

## 2024-09-21 NOTE — Assessment & Plan Note (Signed)
 Continue allopurinol 

## 2024-09-22 ENCOUNTER — Observation Stay: Admit: 2024-09-22 | Discharge: 2024-09-22 | Disposition: A | Attending: Internal Medicine | Admitting: Internal Medicine

## 2024-09-22 DIAGNOSIS — Z9861 Coronary angioplasty status: Secondary | ICD-10-CM

## 2024-09-22 DIAGNOSIS — I10 Essential (primary) hypertension: Secondary | ICD-10-CM | POA: Diagnosis not present

## 2024-09-22 DIAGNOSIS — I5032 Chronic diastolic (congestive) heart failure: Secondary | ICD-10-CM

## 2024-09-22 DIAGNOSIS — I350 Nonrheumatic aortic (valve) stenosis: Secondary | ICD-10-CM | POA: Diagnosis not present

## 2024-09-22 DIAGNOSIS — R55 Syncope and collapse: Secondary | ICD-10-CM

## 2024-09-22 DIAGNOSIS — I2510 Atherosclerotic heart disease of native coronary artery without angina pectoris: Secondary | ICD-10-CM | POA: Diagnosis not present

## 2024-09-22 DIAGNOSIS — I952 Hypotension due to drugs: Secondary | ICD-10-CM | POA: Diagnosis not present

## 2024-09-22 DIAGNOSIS — R42 Dizziness and giddiness: Secondary | ICD-10-CM | POA: Diagnosis not present

## 2024-09-22 DIAGNOSIS — I959 Hypotension, unspecified: Secondary | ICD-10-CM | POA: Diagnosis not present

## 2024-09-22 DIAGNOSIS — N179 Acute kidney failure, unspecified: Secondary | ICD-10-CM | POA: Diagnosis not present

## 2024-09-22 LAB — GLUCOSE, CAPILLARY
Glucose-Capillary: 78 mg/dL (ref 70–99)
Glucose-Capillary: 86 mg/dL (ref 70–99)
Glucose-Capillary: 100 mg/dL — ABNORMAL HIGH (ref 70–99)

## 2024-09-22 LAB — HEMOGLOBIN A1C
Hgb A1c MFr Bld: 6.2 % — ABNORMAL HIGH (ref 4.8–5.6)
Mean Plasma Glucose: 131 mg/dL

## 2024-09-22 LAB — BASIC METABOLIC PANEL WITH GFR
Anion gap: 9 (ref 5–15)
BUN: 22 mg/dL (ref 8–23)
CO2: 23 mmol/L (ref 22–32)
Calcium: 8.4 mg/dL — ABNORMAL LOW (ref 8.9–10.3)
Chloride: 109 mmol/L (ref 98–111)
Creatinine, Ser: 1.26 mg/dL — ABNORMAL HIGH (ref 0.61–1.24)
GFR, Estimated: 58 mL/min — ABNORMAL LOW (ref >60)
Glucose, Bld: 80 mg/dL (ref 70–99)
Potassium: 4 mmol/L (ref 3.5–5.1)
Sodium: 141 mmol/L (ref 135–145)

## 2024-09-22 LAB — ECHOCARDIOGRAM COMPLETE
AR max vel: 0.91 cm2
AV Area VTI: 1.15 cm2
AV Area mean vel: 0.9 cm2
AV Mean grad: 28.7 mmHg
AV Peak grad: 45.7 mmHg
Ao pk vel: 3.38 m/s
Area-P 1/2: 2.76 cm2
Height: 68 in
MV VTI: 2.7 cm2
P 1/2 time: 404 ms
S' Lateral: 2.9 cm
Weight: 3583.8 [oz_av]

## 2024-09-22 LAB — CBC
HCT: 31.8 % — ABNORMAL LOW (ref 39.0–52.0)
Hemoglobin: 10.8 g/dL — ABNORMAL LOW (ref 13.0–17.0)
MCH: 32.8 pg (ref 26.0–34.0)
MCHC: 34 g/dL (ref 30.0–36.0)
MCV: 96.7 fL (ref 80.0–100.0)
Platelets: 170 K/uL (ref 150–400)
RBC: 3.29 MIL/uL — ABNORMAL LOW (ref 4.22–5.81)
RDW: 15.9 % — ABNORMAL HIGH (ref 11.5–15.5)
WBC: 3.4 K/uL — ABNORMAL LOW (ref 4.0–10.5)
nRBC: 0 % (ref 0.0–0.2)

## 2024-09-22 LAB — PHOSPHORUS: Phosphorus: 2.5 mg/dL (ref 2.5–4.6)

## 2024-09-22 LAB — MAGNESIUM: Magnesium: 1.7 mg/dL (ref 1.7–2.4)

## 2024-09-22 MED ORDER — LOSARTAN POTASSIUM 100 MG PO TABS: 50.0000 mg | ORAL_TABLET | Freq: Every day | ORAL | 0 refills | Status: DC

## 2024-09-22 MED ORDER — ATENOLOL 25 MG PO TABS
25.0000 mg | ORAL_TABLET | Freq: Every day | ORAL | Status: DC
  Administered 2024-09-22: 25 mg via ORAL
  Filled 2024-09-22: qty 1

## 2024-09-22 MED ORDER — ALLOPURINOL 100 MG PO TABS: 100.0000 mg | ORAL_TABLET | Freq: Every day | ORAL | Status: AC

## 2024-09-22 MED ORDER — LOSARTAN POTASSIUM 50 MG PO TABS
50.0000 mg | ORAL_TABLET | Freq: Every day | ORAL | Status: DC
  Administered 2024-09-22: 50 mg via ORAL
  Filled 2024-09-22: qty 1

## 2024-09-22 NOTE — Progress Notes (Signed)
 Rounding Note   Patient Name: Thomas Carlson Date of Encounter: 09/22/2024  [facility] Cardiologist: Lonni Hanson, MD   Subjective Long discussion concerning chronic diarrhea, ulcerative colitis managed by GI - In the setting of start of HCTZ with hydralazine  losartan  atenolol  and diarrhea had hypotension and syncope - All medications were held, blood pressures up to 150s supine -Blood pressure yesterday morning 20 point drop down to 130s with standing, asymptomatic Remains on IV fluids this morning Renal function improving closer to baseline  Scheduled Meds:  aspirin  EC  81 mg Oral Daily   azaTHIOprine   50 mg Oral Daily   enoxaparin  (LOVENOX ) injection  0.5 mg/kg Subcutaneous Q24H   insulin  aspart  0-5 Units Subcutaneous QHS   insulin  aspart  0-9 Units Subcutaneous TID WC   insulin  glargine-yfgn  10 Units Subcutaneous Daily   losartan   50 mg Oral Daily   sodium bicarbonate   650 mg Oral TID   sodium chloride  flush  3 mL Intravenous Q12H   tamsulosin   0.4 mg Oral Daily   Continuous Infusions:  PRN Meds: acetaminophen  **OR** acetaminophen , HYDROcodone -acetaminophen , ondansetron  **OR** ondansetron  (ZOFRAN ) IV   Vital Signs  Vitals:   09/22/24 0100 09/22/24 0337 09/22/24 0818 09/22/24 1130  BP:  (!) 157/72 (!) 153/75 (!) 150/84  Pulse:  81 88 78  Resp: 19 20 18 18   Temp:  97.6 F (36.4 C) 97.6 F (36.4 C) 97.8 F (36.6 C)  TempSrc:      SpO2:  99% 94% 98%  Weight:   101.6 kg   Height:        Intake/Output Summary (Last 24 hours) at 09/22/2024 1219 Last data filed at 09/22/2024 0900 Gross per 24 hour  Intake 1756.25 ml  Output 1050 ml  Net 706.25 ml      09/22/2024    8:18 AM 09/21/2024    8:37 PM 09/20/2024    5:00 PM  Last 3 Weights  Weight (lbs) 223 lb 15.8 oz 223 lb 15.8 oz 229 lb  Weight (kg) 101.6 kg 101.6 kg 103.874 kg      Telemetry Normal sinus rhythm- Personally Reviewed  ECG   - Personally Reviewed  Physical Exam  GEN:  No acute distress.   Neck: No JVD Cardiac: RRR, no murmurs, rubs, or gallops.  Respiratory: Clear to auscultation bilaterally. GI: Soft, nontender, non-distended  MS: No edema; No deformity. Neuro:  Nonfocal  Psych: Normal affect   Labs High Sensitivity Troponin:  No results for input(s): TROPONINIHS in the last 720 hours.   Chemistry Recent Labs  Lab 09/18/24 1446 09/20/24 1543 09/21/24 0303 09/22/24 0519  NA 139 137 139 141  K 4.4 4.2 3.8 4.0  CL 105 101 107 109  CO2 22 23 19* 23  GLUCOSE 167* 121* 92 80  BUN 36* 29* 29* 22  CREATININE 1.93* 1.70* 1.44* 1.26*  CALCIUM  9.1 10.1 9.0 8.4*  MG  --   --  1.7 1.7  PROT 5.7* 6.6  --   --   ALBUMIN 3.7 4.1  --   --   AST 15 24  --   --   ALT 17 19  --   --   ALKPHOS 61 70  --   --   BILITOT 0.4 0.5  --   --   GFRNONAA 35* 41* 49* 58*  ANIONGAP 12 13 13 9     Lipids No results for input(s): CHOL, TRIG, HDL, LABVLDL, LDLCALC, CHOLHDL in the last 168 hours.  Hematology  Recent Labs  Lab 09/20/24 1543 09/21/24 0303 09/22/24 0519  WBC 4.8 4.3 3.4*  RBC 3.87* 3.42* 3.29*  HGB 12.7* 11.3* 10.8*  HCT 37.7* 33.3* 31.8*  MCV 97.4 97.4 96.7  MCH 32.8 33.0 32.8  MCHC 33.7 33.9 34.0  RDW 16.0* 16.3* 15.9*  PLT 209 178 170   Thyroid   Recent Labs  Lab 09/20/24 1543  TSH 4.380    BNP Recent Labs  Lab 09/18/24 1446  PROBNP 751.0*    DDimer No results for input(s): DDIMER in the last 168 hours.   Radiology  DG Chest Port 1 View Result Date: 09/20/2024 EXAM: 1 VIEW XRAY OF THE CHEST 09/20/2024 05:33:00 PM COMPARISON: 09/18/2024 CLINICAL HISTORY: Questionable sepsis - evaluate for abnormality. FINDINGS: LUNGS AND PLEURA: Shallow inspiration. Atelectasis or infiltration in the lung bases, developing since prior study. No pleural effusion. No pneumothorax. HEART AND MEDIASTINUM: Cardiac enlargement. BONES AND SOFT TISSUES: No acute osseous abnormality. IMPRESSION: 1. Atelectasis or infiltration in the lung  bases, developing since prior study. 2. Cardiomegaly. Electronically signed by: Elsie Gravely MD 09/20/2024 06:32 PM EST RP Workstation: [id number]    Cardiac Studies   Patient Profile   78 year old man with history of aortic stenosis, coronary artery disease with remote PCI to the LAD, hypertension, hyperlipidemia, type 2 diabetes mellitus, and remote seizure in the setting of subdural hematoma, whom we have been asked to see due to hypotension and syncope   Assessment & Plan  Syncope/hypotension -Recently started on hydralazine  and HCTZ in the ER -In addition to his losartan  and atenolol  with diarrhea, dehydration, hypotension, moderate to severe aortic valve stenosis, had episode of syncope November 21 -Blood pressure improving on IV fluids, close to his baseline -Blood pressure stabilized in the 150 range -Will recommend we restart losartan  50 and atenolol  25 as he was taking previously -Hydralazine  and HCTZ on hold -Echo pending - Consider ZIO monitor on discharge to evaluate for arrhythmia   Aortic valve stenosis - Most recent echo 03/2024 with moderate to severe aortic valve stenosis, VTI 1.69 cm and mean gradient 30 mmHg - Echo pending as above   HFpEF - Most recent echo 03/2024 with preserved LV systolic function and G1 DD - Presenting with dehydration, can likely wean off IV fluids as creatinine close to his baseline   AKI - Hypovolemia from diarrhea and HCTZ -Would avoid diuretics given chronic diarrhea in the setting of ulcerative colitis   Coronary artery disease - No chest pain - Troponin minimally elevated, likely demand ischemia - Continue aspirin  and statin     For questions or updates, please contact [facility] Please consult [email or web address] for contact info under   Signed, Sharis Keeran, MD  09/22/2024, 12:19 PM

## 2024-09-22 NOTE — Care Management Obs Status (Signed)
 MEDICARE OBSERVATION STATUS NOTIFICATION   Patient Details  Name: Thomas Carlson MRN: [id number] Date of Birth: 1946-04-17   Medicare Observation Status Notification Given:   this patient is having test on the floor, in his room.  This is my second attempt.    Rojelio SHAUNNA Rattler 09/22/2024, 11:50 AM

## 2024-09-22 NOTE — Care Management Obs Status (Signed)
 MEDICARE OBSERVATION STATUS NOTIFICATION   Patient Details  Name: Thomas Carlson MRN: [id number] Date of Birth: 12-Mar-1946   Medicare Observation Status Notification Given:  No  Patient discharged   Rojelio SHAUNNA Rattler 09/22/2024, 2:46 PM

## 2024-09-22 NOTE — TOC CM/SW Note (Signed)
 Transition of Care [facility]) - Inpatient Brief Assessment   Patient Details  Name: Thomas Carlson MRN: [id number] Date of Birth: 1946-09-25  Transition of Care [facility]) CM/SW Contact:    Shasta DELENA Daring, RN Phone Number: 09/22/2024, 2:33 PM   Clinical Narrative: RNCM reviewed chart. No TOC needs identified at this time. Will continue to follow for needs development.   Transition of Care Asessment: Insurance and Status: Insurance coverage has been reviewed Patient has primary care physician: Yes Home environment has been reviewed: single family home Prior level of function:: independent Prior/Current Home Services: No current home services Social Drivers of Health Review: SDOH reviewed no interventions necessary Readmission risk has been reviewed: Yes Transition of care needs: no transition of care needs at this time

## 2024-09-22 NOTE — Addendum Note (Signed)
 Addended by: [name] BALLER on: 09/22/2024 07:24 AM   Modules accepted: Orders

## 2024-09-22 NOTE — Hospital Course (Signed)
 Hospital course / significant events:   HPI: ***  11/22: initial ED visit w/ dyncope and hypotension - improved significantly w/ fluids and pt feeling better, ok for dc w/ close follow up 11/24: saw PCP. Marked hypotension in setting of recently stopping budesonide  9mg  daily - ?adrenal insufficiency component. PCP plan to refer to endocrinology for eval adrenal insufficiency. Advised to ED for low BP. Admitted to hospitalist  11/25: cardiology consult - concern for overmedication / arrhythmia, worse w/ recurrent diarrhea. Defer resume BP meds, would start w/ losartan  if anything. Repeat Echo. Plan Zio upon dc.       Consultants:  Cardiology   Procedures/Surgeries: ***      ASSESSMENT & PLAN:   Hypotension, multifactorial, with syncopal event 09/18/2024 Differential include antihypertensive meds(recent admission for hypertensive emergency) in the setting of recent worsening of chronic diarrhea with possibility of adrenal insufficiency following discontinuation of long-term oral budesonide  a week ago -IV hydration with monitoring for fluid overload -Hold antihypertensives and diuretics -Fall precautions, orthostatic vitals -random cortisol level wnl, No adrenal insufficiency  - Cardiology consulted, recommended to resume losartan  at lower dose on discharge if blood pressure remains stable.  2D echocardiogram to check aortic valve stenosis, if it is worsening then patient may need cardiac cath. Cardiac monitor on discharge to check arrhythmia Follow cards for further recommendation     # Syncopal event 09/18/2024 Multifactorial, hypotension, aortic stenosis  F/u repeat echo Cardiology consulted   # Lymphocytic/ulcerative colitis with chronic diarrhea  On Chronic immunosuppressive medications  Currently on azathioprine ,-budesonide  discontinued a week ago Patient with recent acute worsening of diarrhea, in spite of initial improvement with switching immunosuppressants from oral  budesonide  to azathioprine  Patient states he usually stays well-hydrated as he has had this condition for several years Last evaluated by GI 09/07/2024   # AKI (acute kidney injury) Secondary to ATN related to renal hypoperfusion from hypotension, GI losses from recent worsening of diarrhea Creatinine 1.7 ( baseline of 1.16 07/2024) IV hydration Monitor renal function and avoid nephrotoxins 11/25 CO2 19, mild metabolic acidosis.  Started oral bicarbonate sCr 1.7>> 1.44 improving Check BMP daily   # Moderate to severe aortic stenosis(on echo 03/2024) Avoid dehydration, hypotension, overhydration Cardiology consulted   # Chronic heart failure with preserved ejection fraction (HFpEF, >= 50%) Clinically euvolemic Holding BP lowering GDMT in the setting of hypotension (atenolol , hydralazine , losartan , furosemide  and hydrochlorothiazide ) Monitor for fluid overload in view of IV fluid to treat hypotension Daily weights with intake and output monitoring   # Diabetes mellitus (HCC) Continue Lantus  Sliding scale coverage   # CAD with history of LAD stent(2005) Elevated troponin, but downtrending from 2 days prior so ACS not suspected Patient denies chest pain and EKG nonacute Getting echocardiogram so will evaluate for segmental wall motion abnormality Continue aspirin  and Crestor  Hold blood pressure lowering GDMT of losartan  and atenolol    # Remote history of seizure s/p craniectomy for subdural hematoma No acute issues Not on AEDs   # 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  Underweight - under 18  overweight - 25 to 29 obese - 30 or more Class 1 obesity: BMI of 30.0 to 34 Class 2 obesity: BMI of 35.0 to 39 Class 3 obesity: BMI of 40.0 to 49 Super Morbid Obesity: BMI 50-59 Super-super Morbid Obesity: BMI 60+ Healthy nutrition  and  physical activity advised as adjunct to other disease management and risk reduction treatments    DVT prophylaxis: *** IV fluids: *** continuous IV fluids  Nutrition: *** Central lines / other devices: ***  Code Status: *** ACP documentation reviewed: *** none on file in [facility]  TOC needs: *** Medical barriers to dispo: ***. Expected medical readiness for discharge ***.

## 2024-09-22 NOTE — Progress Notes (Signed)
*  PRELIMINARY RESULTS* Echocardiogram 2D Echocardiogram has been performed.  Thomas Carlson 09/22/2024, 12:07 PM

## 2024-09-22 NOTE — Care Management Obs Status (Signed)
 MEDICARE OBSERVATION STATUS NOTIFICATION   Patient Details  Name: SAHARSH STERLING MRN: [id number] Date of Birth: Jun 05, 1946  No patient discharged  Medicare Observation Status Notification Given:       Rojelio SHAUNNA Rattler 09/22/2024, 2:44 PM

## 2024-09-22 NOTE — Discharge Summary (Signed)
 Physician Discharge Summary   Patient: Thomas Carlson  DOB: 1946/07/28   Admit:     Date of Admission: 09/20/2024 Admitted from: home   Discharge: Date of discharge: 09/22/24 Disposition: Home Condition at discharge: good  CODE STATUS: FULL CODE     Discharge Physician: Laneta Blunt, DO [facility]     PCP: [name] Baller, MD  Recommendations for Outpatient Follow-up:  Follow up with PCP [name] Baller, MD in 1-2 weeks Follow as directed w/ cardiology    Discharge Instructions     Diet - low sodium heart healthy   Complete by: As directed    Increase activity slowly   Complete by: As directed          Discharge Diagnoses: Principal Problem:   Hypotension, multifactorial, with syncopal event 09/18/2024 Active Problems:   HTN (hypertension)   AKI (acute kidney injury)   Chronic diarrhea secondary to lymphocytic colitis   Syncopal event 09/18/2024   Moderate to severe aortic stenosis(on echo 03/2024)   CAD with history of LAD stent(2005)   Diabetes mellitus (HCC)   Chronic heart failure with preserved ejection fraction (HFpEF, >= 50%) (HCC)   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 Marked hypotension in setting of recently stopping budesonide  9mg  daily - ?adrenal insufficiency component. PCP plan to refer to endocrinology for eval adrenal insufficiency. Advised to ED for low BP. Admitted to hospitalist  11/25: cardiology consult - concern for overmedication / arrhythmia, worse w/ recurrent diarrhea. Defer resume BP meds, would start w/ losartan  if anything. Repeat Echo. Plan Zio upon dc.       Consultants:   Cardiology   Procedures/Surgeries: none      ASSESSMENT & PLAN:   Hypotension, multifactorial, with syncopal event 09/18/2024 Likely overmedication Reasonably excluded adrenal insufficiency following discontinuation of long-term oral budesonide  a week ago - normal cortisol levels 2D echocardiogram stable Cardiology consulted, recommended to resume losartan  at lower dose on discharge if blood pressure remains stable.   Resume metoprolol holding hydrochlorothiazide  and hydralazine    # Lymphocytic/ulcerative colitis with chronic diarrhea  On Chronic immunosuppressive medications  Currently on azathioprine ,-budesonide  discontinued a week ago Patient with recent acute worsening of diarrhea, in spite of initial improvement with switching immunosuppressants from oral budesonide  to azathioprine  Patient states he usually stays well-hydrated as he has had this condition for several years Last evaluated by GI 09/07/2024 Hydration Follow w/ GI   # AKI (acute kidney injury) Secondary to ATN related to renal hypoperfusion from hypotension, GI losses from recent worsening of diarrhea Follow BMP outpatient   # Moderate to severe aortic stenosis(on echo 03/2024) Avoid dehydration, hypotension, overhydration Follow w/ cardiology   # Chronic heart failure with preserved ejection fraction (HFpEF, >= 50%) Clinically euvolemic # CAD with history of LAD stent(2005) Follow w/ cardiology   # Diabetes mellitus (HCC) Continue home insulin  Holding metformin  pending BMP outpatient   # Remote history of seizure s/p craniectomy for subdural hematoma No acute issues Not on AEDs   # 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  Underweight - under 18  overweight - 25 to 29 obese - 30 or more Class 1 obesity: BMI of 30.0  to 34 Class 2 obesity: BMI of 35.0 to 39 Class 3 obesity: BMI of 40.0 to 49 Super Morbid Obesity: BMI 50-59 Super-super Morbid Obesity: BMI 60+ Healthy nutrition and physical activity advised as adjunct to other disease management and risk reduction treatments             Discharge Instructions  Allergies as of 09/22/2024       Reactions   Gabapentin Other (See Comments)   unknown   Bactrim [sulfamethoxazole-trimethoprim] Rash   Diffuse drug reaction - maculopapular rash   Penicillins Hives   Has patient had a PCN reaction causing immediate rash, facial/tongue/throat swelling, SOB or lightheadedness with hypotension: Unknown Has patient had a PCN reaction causing severe rash involving mucus membranes or skin necrosis: Unknown Has patient had a PCN reaction that required hospitalization: Unknown Has patient had a PCN reaction occurring within the last 10 years: Unknown If all of the above answers are NO, then may proceed with Cephalosporin use.   Lasix  [furosemide ] Other (See Comments)   Drops blood pressure and drained potassium and magnesium    Prevnar 20 [pneumococcal 20-val Conj Vacc] Rash   Large local reaction at injection site        Medication List     PAUSE taking these medications    metFORMIN  500 MG 24 hr tablet Wait to take this until your doctor or other care provider tells you to start again. Commonly known as: GLUCOPHAGE -XR Take 2 tablets (1,000 mg total) by mouth daily with breakfast.       STOP taking these medications    budesonide  3 MG 24 hr capsule Commonly known as: ENTOCORT EC    hydrALAZINE  10 MG tablet Commonly known as: APRESOLINE    hydrochlorothiazide  12.5 MG capsule Commonly known as: MICROZIDE        TAKE these medications    Accu-Chek Guide Me w/Device Kit Use as instructed to check blood sugar once a day   Accu-Chek Guide Test test strip Generic drug: glucose blood Use as instructed to check blood sugar once a  day   Accu-Chek Softclix Lancets lancets Use as instructed to check blood sugar once a day   acyclovir  400 MG tablet Commonly known as: ZOVIRAX  TAKE 1 TABLET EVERY DAY   allopurinol  100 MG tablet Commonly known as: ZYLOPRIM  Take 1 tablet (100 mg total) by mouth daily. What changed: when to take this   aspirin  EC 81 MG tablet Take 1 tablet (81 mg total) by mouth daily.   atenolol  25 MG tablet Commonly known as: TENORMIN  Take 1 tablet (25 mg total) by mouth daily.   azaTHIOprine  50 MG tablet Commonly known as: IMURAN  Take 50 mg by mouth daily.   B-12 1000 MCG Subl Place 1 tablet under the tongue once a week.   Biotin  1000 MCG tablet Take 1,000 mcg by mouth daily.   Lantus  SoloStar 100 UNIT/ML Solostar Pen Generic drug: insulin  glargine Inject 10 Units into the skin daily.   losartan  100 MG tablet Commonly known as: COZAAR  Take 0.5 tablets (50 mg total) by mouth daily. What changed: how much to take   Magnesium  500 MG Tabs Take 500 mg by mouth daily.   Pen Needles 31G X 5 MM Misc Use as directed to inject insulin   daily   potassium chloride  10 MEQ tablet Commonly known as: KLOR-CON  M Take 1 tablet (10 mEq total) by mouth daily.   tamsulosin  0.4 MG Caps capsule Commonly known as: FLOMAX  Take 1 capsule (0.4 mg total) by mouth daily.   Vitamin D3 25 MCG (1000 UT) Caps Take 1 capsule (1,000 Units total) by mouth daily.          Allergies  Allergen Reactions   Gabapentin Other (See Comments)    unknown   Bactrim [Sulfamethoxazole-Trimethoprim] Rash    Diffuse drug reaction - maculopapular rash   Penicillins Hives    Has patient had a PCN reaction causing immediate rash, facial/tongue/throat swelling, SOB or lightheadedness with hypotension: Unknown Has patient had a PCN reaction causing severe rash involving mucus membranes or skin necrosis: Unknown Has patient had a PCN reaction that required hospitalization: Unknown Has patient had a PCN reaction  occurring within the last 10 years: Unknown If all of the above answers are NO, then may proceed with Cephalosporin use.    Lasix  [Furosemide ] Other (See Comments)    Drops blood pressure and drained potassium and magnesium    Prevnar 20 [Pneumococcal 20-Val Conj Vacc] Rash    Large local reaction at injection site     Subjective: pt reports improvement, eager for dc home. NO CP/SOB   Discharge Exam: BP (!) 150/84 (BP Location: Right Arm)   Pulse 78   Temp 97.8 F (36.6 C)   Resp 18   Ht 5' 8 (1.727 m)   Wt 101.6 kg   SpO2 98%   BMI 34.06 kg/m  General: Pt is alert, awake, not in acute distress Cardiovascular: RRR, S1/S2 +, no rubs, no gallops Respiratory: CTA bilaterally, no wheezing, no rhonchi Abdominal: Soft, NT, ND, bowel sounds + Extremities: no edema, no cyanosis     The results of significant diagnostics from this hospitalization (including imaging, microbiology, ancillary and laboratory) are listed below for reference.     Microbiology: Recent Results (from the past 240 hours)  Blood Culture (routine x 2)     Status: None (Preliminary result)   Collection Time: 09/20/24  5:29 PM   Specimen: Right Antecubital; Blood  Result Value Ref Range Status   Specimen Description RIGHT ANTECUBITAL  Final   Special Requests   Final    BOTTLES DRAWN AEROBIC AND ANAEROBIC Blood Culture adequate volume   Culture   Final    NO GROWTH 2 DAYS Performed at [facility], [address] [place], [place] [address]    Report Status PENDING  Incomplete     Labs: BNP (last 3 results) Recent Labs    08/17/24 1953  BNP 1,134.2*   Basic Metabolic Panel: Recent Labs  Lab 09/18/24 1446 09/20/24 1543 09/21/24 0303 09/22/24 0519  NA 139 137 139 141  K 4.4 4.2 3.8 4.0  CL 105 101 107 109  CO2 22 23 19* 23  GLUCOSE 167* 121* 92 80  BUN 36* 29* 29* 22  CREATININE 1.93* 1.70* 1.44* 1.26*  CALCIUM  9.1 10.1 9.0 8.4*  MG  --   --  1.7 1.7  PHOS  --   --  3.6  2.5   Liver Function Tests: Recent Labs  Lab 09/18/24 1446 09/20/24 1543  AST 15 24  ALT 17 19  ALKPHOS 61 70  BILITOT 0.4 0.5  PROT 5.7* 6.6  ALBUMIN 3.7 4.1   No results for input(s): LIPASE, AMYLASE in the last 168 hours. No results for input(s): AMMONIA in  the last 168 hours. CBC: Recent Labs  Lab 09/18/24 1446 09/20/24 1543 09/21/24 0303 09/22/24 0519  WBC 4.6 4.8 4.3 3.4*  NEUTROABS 3.2  --   --   --   HGB 11.3* 12.7* 11.3* 10.8*  HCT 34.0* 37.7* 33.3* 31.8*  MCV 98.6 97.4 97.4 96.7  PLT 168 209 178 170   Cardiac Enzymes: No results for input(s): CKTOTAL, CKMB, CKMBINDEX, TROPONINI in the last 168 hours. BNP: Invalid input(s): POCBNP CBG: Recent Labs  Lab 09/21/24 1621 09/21/24 2019 09/22/24 0541 09/22/24 0819 09/22/24 1132  GLUCAP 91 87 78 86 100*   D-Dimer No results for input(s): DDIMER in the last 72 hours. Hgb A1c Recent Labs    09/20/24 1543  HGBA1C 6.2*   Lipid Profile No results for input(s): CHOL, HDL, LDLCALC, TRIG, CHOLHDL, LDLDIRECT in the last 72 hours. Thyroid  function studies Recent Labs    09/20/24 1543  TSH 4.380   Anemia work up Recent Labs    09/21/24 0303  TIBC 266  IRON 138   Urinalysis    Component Value Date/Time   COLORURINE YELLOW 08/17/2024 1737   APPEARANCEUR CLEAR 08/17/2024 1737   LABSPEC 1.025 08/17/2024 1737   PHURINE 5.5 08/17/2024 1737   GLUCOSEU NEGATIVE 08/17/2024 1737   HGBUR MODERATE (A) 08/17/2024 1737   BILIRUBINUR NEGATIVE 08/17/2024 1737   BILIRUBINUR negative 11/03/2023 1157   BILIRUBINUR negative 03/18/2023 1515   KETONESUR NEGATIVE 08/17/2024 1737   PROTEINUR >300 (A) 08/17/2024 1737   UROBILINOGEN 0.2 11/03/2023 1157   NITRITE NEGATIVE 08/17/2024 1737   LEUKOCYTESUR NEGATIVE 08/17/2024 1737   Sepsis Labs Recent Labs  Lab 09/18/24 1446 09/20/24 1543 09/21/24 0303 09/22/24 0519  WBC 4.6 4.8 4.3 3.4*   Microbiology Recent Results (from the past  240 hours)  Blood Culture (routine x 2)     Status: None (Preliminary result)   Collection Time: 09/20/24  5:29 PM   Specimen: Right Antecubital; Blood  Result Value Ref Range Status   Specimen Description RIGHT ANTECUBITAL  Final   Special Requests   Final    BOTTLES DRAWN AEROBIC AND ANAEROBIC Blood Culture adequate volume   Culture   Final    NO GROWTH 2 DAYS Performed at [facility], [address], [place], [place] [address]    Report Status PENDING  Incomplete   Imaging ECHOCARDIOGRAM COMPLETE Result Date: 09/22/2024    ECHOCARDIOGRAM REPORT   Patient Name:   Thomas Carlson [facility] Date of Exam: 09/22/2024 Medical Rec #:  Carlson        Height:       68.0 in Accession #:    [id number]       Weight:       224.0 lb Date of Birth:  05-25-1946        BSA:          2.145 m Patient Age:    78 years         BP:           150/84 mmHg Patient Gender: M                HR:           78 bpm. Exam Location:  [facility] Procedure: 2D Echo, Cardiac Doppler and Color Doppler (Both Spectral and Color            Flow Doppler were utilized during procedure). Indications:     Syncope R55  History:  Patient has prior history of Echocardiogram examinations, most                  recent 04/19/2024. CAD, Signs/Symptoms:Murmur; Risk                  Factors:Hypertension.  Sonographer:     Christopher Furnace Referring Phys:  [phone] CHRISTOPHER END Diagnosing Phys: Timothy Gollan MD IMPRESSIONS  1. Left ventricular ejection fraction, by estimation, is 55 to 60%. Left ventricular ejection fraction by PLAX is 57 %. The left ventricle has normal function. The left ventricle has no regional wall motion abnormalities. There is mild left ventricular hypertrophy. Left ventricular diastolic parameters are consistent with Grade I diastolic dysfunction (impaired relaxation).  2. Right ventricular systolic function is normal. The right ventricular size is normal. There is normal pulmonary artery systolic pressure. The estimated right  ventricular systolic pressure is 22.5 mmHg.  3. The mitral valve is normal in structure. Mild mitral valve regurgitation. No evidence of mitral stenosis.  4. The aortic valve is calcified. Aortic valve regurgitation is mild to moderate. Moderate to severe aortic valve stenosis. Aortic valve area, by VTI measures 1.15 cm. Aortic valve mean gradient measures 28.7 mmHg. Aortic valve Vmax measures 3.38 m/s.  5. The inferior vena cava is normal in size with greater than 50% respiratory variability, suggesting right atrial pressure of 3 mmHg. FINDINGS  Left Ventricle: Left ventricular ejection fraction, by estimation, is 55 to 60%. Left ventricular ejection fraction by PLAX is 57 %. The left ventricle has normal function. The left ventricle has no regional wall motion abnormalities. Strain was performed and the global longitudinal strain is indeterminate. The left ventricular internal cavity size was normal in size. There is mild left ventricular hypertrophy. Left ventricular diastolic parameters are consistent with Grade I diastolic dysfunction (impaired relaxation). Right Ventricle: The right ventricular size is normal. No increase in right ventricular wall thickness. Right ventricular systolic function is normal. There is normal pulmonary artery systolic pressure. The tricuspid regurgitant velocity is 2.09 m/s, and  with an assumed right atrial pressure of 5 mmHg, the estimated right ventricular systolic pressure is 22.5 mmHg. Left Atrium: Left atrial size was normal in size. Right Atrium: Right atrial size was normal in size. Pericardium: There is no evidence of pericardial effusion. Mitral Valve: The mitral valve is normal in structure. Mild mitral valve regurgitation. No evidence of mitral valve stenosis. MV peak gradient, 5.5 mmHg. The mean mitral valve gradient is 3.0 mmHg. Tricuspid Valve: The tricuspid valve is normal in structure. Tricuspid valve regurgitation is not demonstrated. No evidence of tricuspid  stenosis. Aortic Valve: The aortic valve is calcified. Aortic valve regurgitation is mild to moderate. Aortic regurgitation PHT measures 404 msec. Moderate to severe aortic stenosis is present. Aortic valve mean gradient measures 28.7 mmHg. Aortic valve peak gradient measures 45.7 mmHg. Aortic valve area, by VTI measures 1.15 cm. Pulmonic Valve: The pulmonic valve was normal in structure. Pulmonic valve regurgitation is not visualized. No evidence of pulmonic stenosis. Aorta: The aortic root is normal in size and structure. Venous: The inferior vena cava is normal in size with greater than 50% respiratory variability, suggesting right atrial pressure of 3 mmHg. IAS/Shunts: No atrial level shunt detected by color flow Doppler. Additional Comments: 3D was performed not requiring image post processing on an independent workstation and was indeterminate.  LEFT VENTRICLE PLAX 2D LV EF:         Left  Diastology                ventricular     LV e' medial:    6.64 cm/s                ejection        LV E/e' medial:  9.6                fraction by     LV e' lateral:   7.51 cm/s                PLAX is 57      LV E/e' lateral: 8.5                %. LVIDd:         4.10 cm LVIDs:         2.90 cm LV PW:         1.20 cm LV IVS:        1.50 cm LVOT diam:     2.00 cm LV SV:         81 LV SV Index:   38 LVOT Area:     3.14 cm  RIGHT VENTRICLE RV Basal diam:  2.60 cm     PULMONARY VEINS RV Mid diam:    2.20 cm     Diastolic Velocity: 47.60 cm/s RV S prime:     11.20 cm/s  S/D Velocity:       0.50 TAPSE (M-mode): 1.4 cm      Systolic Velocity:  25.10 cm/s LEFT ATRIUM             Index        RIGHT ATRIUM           Index LA diam:        3.40 cm 1.59 cm/m   RA Area:     11.40 cm LA Vol (A2C):   52.2 ml 24.34 ml/m  RA Volume:   20.80 ml  9.70 ml/m LA Vol (A4C):   56.0 ml 26.11 ml/m LA Biplane Vol: 55.9 ml 26.07 ml/m  AORTIC VALVE AV Area (Vmax):    0.91 cm AV Area (Vmean):   0.90 cm AV Area (VTI):     1.15 cm AV  Vmax:           338.00 cm/s AV Vmean:          254.333 cm/s AV VTI:            0.707 m AV Peak Grad:      45.7 mmHg AV Mean Grad:      28.7 mmHg LVOT Vmax:         97.40 cm/s LVOT Vmean:        73.200 cm/s LVOT VTI:          0.258 m LVOT/AV VTI ratio: 0.36 AI PHT:            404 msec  AORTA Ao Root diam: 3.00 cm MITRAL VALVE                TRICUSPID VALVE MV Area (PHT): 2.76 cm     TR Peak grad:   17.5 mmHg MV Area VTI:   2.70 cm     TR Vmax:        209.00 cm/s MV Peak grad:  5.5 mmHg MV Mean grad:  3.0 mmHg     SHUNTS MV Vmax:       1.17 m/s  Systemic VTI:  0.26 m MV Vmean:      84.7 cm/s    Systemic Diam: 2.00 cm MV Decel Time: 275 msec MV E velocity: 63.90 cm/s MV A velocity: 100.00 cm/s MV E/A ratio:  0.64 Evalene Lunger MD Electronically signed by Evalene Lunger MD Signature Date/Time: 09/22/2024/12:53:28 PM    Final       Time coordinating discharge: over 30 minutes  SIGNED:  Laneta Blunt DO [facility]

## 2024-09-25 LAB — CULTURE, BLOOD (ROUTINE X 2)
Culture: NO GROWTH
Special Requests: ADEQUATE

## 2024-09-27 ENCOUNTER — Telehealth: Payer: Self-pay | Admitting: Internal Medicine

## 2024-09-27 DIAGNOSIS — Z87898 Personal history of other specified conditions: Secondary | ICD-10-CM

## 2024-09-27 NOTE — Telephone Encounter (Signed)
 Pt would like a c/b regarding conversation while in the hospital about driving restrictions. Please advise

## 2024-09-28 ENCOUNTER — Other Ambulatory Visit: Payer: Self-pay | Admitting: Internal Medicine

## 2024-09-28 ENCOUNTER — Ambulatory Visit: Attending: Internal Medicine

## 2024-09-28 DIAGNOSIS — R55 Syncope and collapse: Secondary | ICD-10-CM

## 2024-09-28 DIAGNOSIS — Z87898 Personal history of other specified conditions: Secondary | ICD-10-CM

## 2024-09-28 NOTE — Progress Notes (Unsigned)
 Enrolled for [employer] to mail a ZIO AT Live Telemetry monitor to patients address on file.

## 2024-10-04 ENCOUNTER — Telehealth: Payer: Self-pay | Admitting: Internal Medicine

## 2024-10-04 NOTE — Telephone Encounter (Signed)
 Pt c/o medication issue:  1. Name of Medication: losartan  (COZAAR ) 100 MG tablet   2. How are you currently taking this medication (dosage and times per day)? As written  3. Are you having a reaction (difficulty breathing--STAT)? No  4. What is your medication issue? Pt would like to know if should take 50 MG or 100 MG

## 2024-10-05 NOTE — Telephone Encounter (Signed)
 Left a message for the patient to call back.

## 2024-10-05 NOTE — Telephone Encounter (Signed)
 Given that he presented with a low blood pressure and evidence of acute kidney injury, I agree that Mr. Heider be on the reduced dose of losartan  (50 mg daily rather than his previous 100 mg daily dose).  Lonni Hanson, MD [facility]

## 2024-10-06 NOTE — Telephone Encounter (Signed)
 I spoke with Thomas Carlson in regards to his question of a reduced dose of losartan . As per Dr. Mady;  Given that he presented with a low blood pressure and evidence of acute kidney injury, I agree that Thomas Carlson be on the reduced dose of losartan  (50 mg daily rather than his previous 100 mg daily dose).   Lonni Mady, MD [facility]   Patient verbalized understanding and all questions answered at this time.

## 2024-10-06 NOTE — Telephone Encounter (Signed)
 Pt returning call

## 2024-10-11 ENCOUNTER — Ambulatory Visit

## 2024-10-15 NOTE — Telephone Encounter (Signed)
 Spoke with patient and advised of Dr.Anna's message, he voiced understanding and is scheduled for a lab appointment on 11/01/24.

## 2024-10-15 NOTE — Progress Notes (Signed)
 "  Cardiology Clinic Note   Date: 10/25/2024 ID: Thomas Carlson, DOB [birth year], MRN [id number]  Primary Cardiologist:  Lonni Hanson, MD  Chief Complaint   Thomas Carlson is a 78 y.o. male who presents to the clinic today for hospital follow up.   Patient Profile   Thomas Carlson is followed by Dr. Hanson for the history outlined below.       Past medical history significant for: CAD. LHC 05/03/2004 performed at an outside facility: High-grade stenosis proximal LAD.  PCI with DES 3.5 x 18 mm to proximal LAD. Chronic diastolic heart failure/aortic stenosis. Echo 09/22/2024: EF 55 to 60%.  No RWMA.  Mild LVH.  Grade I DD.  Normal RV size/function.  Normal PA pressure, RVSP 22.5 mmHg.  Mild MR.  Mild to moderate AI.  Moderate to severe aortic valve stenosis, mean gradient 28.7 mmHg. Hypertension. Hyperlipidemia. Lipid panel 08/11/2024: LDL 47, HDL 48, TG 80, total 111. OSA. T2DM. CKD stage IIIa. Syncope.  In summary, patient was previously followed by Dr. Bosie at [facility] cardiology.  Per review of past records patient underwent PCI with DES to proximal LAD in July 2005.  Echo November 2017 demonstrated EF 55 to 60%, hypokinesis of basal septal and basal anterior septal myocardium anterior myocardium, normal RV function, normal PA pressure, mild to moderately calcified aortic valve with mild regurgitation.  He was seen by Dr. Bosie in 2019 for preop evaluation prior to orthopedic surgery.  He underwent a functional study which revealed preserved LV function with fixed defect in the inferior wall.  Echo showed mild aortic stenosis with mean gradient 19.8 mmHg.  He had a repeat echo in December 2023 ordered by PCP which demonstrated EF 50 to 55%, no RWMA, mild LVH, Grade I DD, normal RV size/function, mild LAE, mild MR, moderate aortic valve stenosis, mean gradient 22 mmHg.  Patient establish care with Dr. Hanson on 12/12/2022 at the request of Dr. [name].  He reported stable dyspnea when  walking uphill x 1 to 2 years.  He had no cardiac complaints.  Repeat echo in June 2025 demonstrated moderate to severe aortic stenosis as detailed above.  Upon follow-up in July 2025 he complained of easy fatigability and stable dyspnea.  Plan for continued monitoring of symptoms and repeat echo in 2026.  Patient was sent to the ED by urgent care on 08/17/2024 for elevated BP of 208/74.  He was being seen at GI for chronic diarrhea and was noted to have elevated blood pressure and sent to urgent care who subsequently recommended ED visit.  Patient also complained of lower extremity edema and dyspnea.  Upon arrival to ED BP 218/82.  He was given hydralazine  20 mg IV and diuresed with 40 mg IV Lasix .  BP improved with SBP in the 180s to 190s.  Initial labs: WBC 11.4, hemoglobin 12.2, sodium 142, potassium 3.2, BUN 30, creatinine 1.19, BNP 1134.2, magnesium  2.1.  Troponin 33>> 36.  Chest x-ray demonstrated trace bilateral pleural effusions, linear left basilar consolidation favoring subsegmental atelectasis or scarring.  Patient was discharged on 08/19/2024 with recommendation to follow-up with advanced heart failure clinic.  Patient preferred keeping previously scheduled visit with general cardiology.    Patient was last seen in the office by me on 08/23/2024 for hospital follow-up.  He reported feeling improved since hospital discharge with well-controlled BP.  He reported weight down 14 pounds and lower extremity edema resolved.  He was scheduled for repeat echo and close follow-up.  Patient  presented to the ED on 09/18/2024 for syncope.  EMS reported initial BP 60s/40s with improvement after fluid to 80s/60s.  Patient denied chest pain, palpitations or lightheadedness prior to syncopal episode.  Patient reported a several month history of diarrhea for which she was seeing GI.  It had briefly resolved with recurrence on 11/21.  He went to a college football game on 11/22 where he felt slightly dizzy and had  difficulty focusing on the game.  He then suddenly passed out with no prodromal symptoms.  Labs were consistent with dehydration and troponin was mildly elevated at 49.  EKG without acute ischemic changes.  Fluid resuscitation normalized BP.  He was discharged home.  He presented back to the ED on 09/20/2024 from PCPs office with hypotension with BP 70/40.  He was not taking hydralazine  but had continued to take losartan , atenolol , hydrochlorothiazide .  Initial BP in the ED was 103/62.  Lab work demonstrated persistent AKI with some improvement from 2 days prior.  Echo demonstrated normal LV/RV function with mild to moderate AI and moderate to severe aortic stenosis.  Outpatient monitor was placed prior to discharge. He was instructed to stop hydralazine  and hydrochlorothiazide .      History of Present Illness    Today, patient is accompanied by his wife. He reports no further syncopal events. His BP has improved and SBP has been >110 and <130 consistently. He denies lightheadedness or dizziness. No palpitations. He completed 14 days of wearing the Zio and sent it back on 12/23. His wife states when his BP started dropping she increased his sodium. She reports prior to that she was severely restricting his sodium to the point that she was making him homemade salad dressing to ensure he was not getting added sodium. He denies lower extremity edema, orthopnea or PND. Patient reports increased dyspnea with heavier exertion but not with routine activities. He feels this is worse since his hospital admission. He continues to have bouts of diarrhea for which he is followed by GI.     ROS: All other systems reviewed and are otherwise negative except as noted in History of Present Illness.  EKGs/Labs Reviewed    EKG Interpretation Date/Time:  Monday October 25 2024 15:08:36 EST Ventricular Rate:  66 PR Interval:  172 QRS Duration:  90 QT Interval:  420 QTC Calculation: 440 R Axis:   -4  Text  Interpretation: Normal sinus rhythm Minimal voltage criteria for LVH, may be normal variant ( R in aVL ) When compared with ECG of 20-Sep-2024 16:55, No significant change was found Confirmed by Loistine Sober [id number] on 10/25/2024 3:11:37 PM   09/20/2024: ALT 19; AST 24 09/22/2024: BUN 22; Creatinine, Ser 1.26; Potassium 4.0; Sodium 141   09/22/2024: Hemoglobin 10.8; WBC 3.4   09/20/2024: TSH 4.380   08/17/2024: B Natriuretic Peptide 1,134.2 09/18/2024: Pro Brain Natriuretic Peptide 751.0    Physical Exam    VS:  BP (!) 116/56   Pulse 66   Ht 5' 8.5 (1.74 m)   Wt 231 lb 6.4 oz (105 kg)   SpO2 97%   BMI 34.67 kg/m  , BMI Body mass index is 34.67 kg/m.  GEN: Well nourished, well developed, in no acute distress. Neck: No JVD or carotid bruits. Cardiac:  RRR. 3/6 systolic murmur. No rubs or gallops.   Respiratory:  Respirations regular and unlabored. Clear to auscultation without rales, wheezing or rhonchi. GI: Soft, nontender, nondistended. Extremities: Radials/DP/PT 2+ and equal bilaterally. No clubbing or cyanosis. No edema  Skin: Warm and dry, no rash. Neuro: Strength intact.  Assessment & Plan   CAD S/p PCI with DES to proximal LAD July 2005.  Patient denies chest pain, pressure or tightness. EKG without acute changes.  - Continue aspirin , rosuvastatin , atenolol .    Chronic HFpEF/aortic stenosis Echo November 2025 demonstrated normal LV/RV function, Grade I DD, normal PA pressure, mild MR, mild to moderate AI, moderate to severe aortic valve stenosis mean gradient 28.7 mmHg.  Patient denies lower extremity edema, orthopnea or PND. He endorses increased dyspnea with heavier exertion that is worse since hospital admission. He is able to tolerate routine activities.  Euvolemic and well compensated on exam. 3/6 systolic murmur.  - Will reach out to structural heart team to discuss if he is a candidate to see them or CT surgery.  - Continue losartan , atenolol .     Hypertension BP today 116/56. Home SBP has been running >110 and >130 consistently. He denies lightheadedness or dizziness.   - Continue losartan , atenolol . Discussed splitting medications to take one in the AM and one in the PM. If SBP persistently on the soft side would consider decreasing losartan  further to 25 mg daily.   Syncope/hypotension Patient with syncopal event on 09/18/2024 in the setting of diarrhea and dehydration. He was noted to be hypotensive at follow up with PCP 2 days later and sent back to ED. Patient  continues to have bouts of diarrhea for which he is followed by GI. He has had no further syncopal episodes. SBP >110 and <130 consistently. He continues to hold hydralazine  and hydrochlorothiazide . Losartan  was decreased to 50 mg daily. Discussed dosing (see above). Patient mailed in Zio monitor on 12/23. Results are not back yet.  - Await results of Zio.  - Continue to monitor BP at home.   T2DM Patient's wife requests referral to nutritionist for better management. A1c 6.29 August 2024. Patient has been instructed to hold metformin .  - Refer to nutritionist.   Disposition: Return in 3 months or sooner as needed.          Signed, Barnie HERO. Krystan Northrop, DNP, NP-C  "

## 2024-10-18 ENCOUNTER — Other Ambulatory Visit: Payer: Self-pay | Admitting: Family Medicine

## 2024-10-18 DIAGNOSIS — Z794 Long term (current) use of insulin: Secondary | ICD-10-CM

## 2024-10-19 ENCOUNTER — Other Ambulatory Visit: Payer: Self-pay | Admitting: Internal Medicine

## 2024-10-19 MED ORDER — LOSARTAN POTASSIUM 100 MG PO TABS: 50.0000 mg | ORAL_TABLET | Freq: Every day | ORAL | 2 refills | Status: AC

## 2024-10-25 ENCOUNTER — Ambulatory Visit: Attending: Student | Admitting: Student

## 2024-10-25 ENCOUNTER — Encounter: Payer: Self-pay | Admitting: Student

## 2024-10-25 VITALS — BP 116/56 | HR 66 | Ht 68.5 in | Wt 231.4 lb

## 2024-10-25 DIAGNOSIS — I350 Nonrheumatic aortic (valve) stenosis: Secondary | ICD-10-CM | POA: Diagnosis not present

## 2024-10-25 DIAGNOSIS — I2510 Atherosclerotic heart disease of native coronary artery without angina pectoris: Secondary | ICD-10-CM | POA: Diagnosis not present

## 2024-10-25 DIAGNOSIS — R55 Syncope and collapse: Secondary | ICD-10-CM

## 2024-10-25 DIAGNOSIS — E118 Type 2 diabetes mellitus with unspecified complications: Secondary | ICD-10-CM

## 2024-10-25 DIAGNOSIS — Z794 Long term (current) use of insulin: Secondary | ICD-10-CM | POA: Diagnosis not present

## 2024-10-25 DIAGNOSIS — I5032 Chronic diastolic (congestive) heart failure: Secondary | ICD-10-CM | POA: Diagnosis not present

## 2024-10-25 DIAGNOSIS — E861 Hypovolemia: Secondary | ICD-10-CM | POA: Diagnosis not present

## 2024-10-25 NOTE — Patient Instructions (Signed)
 Medication Instructions:   Your physician recommends that you continue on your current medications as directed. Please refer to the Current Medication list given to you today.    *If you need a refill on your cardiac medications before your next appointment, please call your pharmacy*  Lab Work:  None ordered at this time   If you have labs (blood work) drawn today and your tests are completely normal, you will receive your results only by:  MyChart Message (if you have MyChart) OR  A paper copy in the mail If you have any lab test that is abnormal or we need to change your treatment, we will call you to review the results.  Testing/Procedures:  None ordered at this time   Referrals:  Your cardiologist has referred you to [facility].  We have attached their office location and phone number below.  Please allow them 3-5 business days to reach out to you to make an appointment.  If you have not heard from their office within that time, please call them to schedule your appointment.    Follow-Up:  At [facility], you and your health needs are our priority.  As part of our continuing mission to provide you with exceptional heart care, our providers are all part of one team.  This team includes your primary Cardiologist (physician) and Advanced Practice Providers or APPs (Physician Assistants and Nurse Practitioners) who all work together to provide you with the care you need, when you need it.  Your next appointment:   3 month(s)  Provider:    Lonni Hanson, MD    We recommend signing up for the patient portal called MyChart.  Sign up information is provided on this After Visit Summary.  MyChart is used to connect with patients for Virtual Visits (Telemedicine).  Patients are able to view lab/test results, encounter notes, upcoming appointments, etc.  Non-urgent messages can be sent to your provider as well.   To learn more about what you can do  with MyChart, go to [email or web address].   Other Instructions None ordered at this time

## 2024-10-26 ENCOUNTER — Encounter: Payer: Self-pay | Admitting: Physician Assistant

## 2024-10-28 NOTE — Progress Notes (Deleted)
 "  Patient ID: Thomas Carlson MRN: [id number] DOB/AGE: May 14, 1946 79 y.o.  Primary Care Physician:Gutierrez, Anton, MD Primary Cardiologist: End  CC:  Aortic valvular disease management     FOCUSED PROBLEM LIST:   Aortic stenosis AVA 1.15, MG 28, V-max 3.3, EF 55 to 60% TTE November 2025 EKG sinus rhythm without bundle-branch blocks Coronary artery disease Status post PCI proximal LAD 2005 outside hospital Chronic diastolic heart failure G1 DD, MG 38, EF 55 to 60% TTE November 2025 T2DM On insulin  Hypertension Hyperlipidemia CKD stage IIIa Ulcerative colitis On azathioprine  BMI 34  January 2026:  Patient consents to use of AI scribe. The patient is 79 year old male with above listed medical problems referred for recommendations regarding his aortic valvular disease.  The patient was seen in emergency department in November for syncope.  Apparently he was at a football game and felt dizzy and apparently passed out.  He was treated with IV fluids and discharged home.  He was seen by cardiology in late December.  He had no further syncopal events.  He did report exertional dyspnea with higher workloads but not with routine activity.         Past Medical History:  Diagnosis Date   Acute posthemorrhagic anemia    Allergy    Aortic valve sclerosis 08/13/2016   Sclerosis without stenosis by US  (08/2016)   CAD (coronary artery disease) 2005   s/p stent ([facility])   Cancer (HCC) 3/25   Cholelithiasis    by CT   Chronic kidney disease, stage 3, mod decreased GFR (HCC)    Diabetes mellitus (HCC) 2010   Diverticulosis    by CT   DJD (degenerative joint disease)    knee   Encephalomalacia without cerebral infarction 03/08/2019   Remote anterior frontal and central MCA encephalomalacia after remote trauma noted on MRI 2020 [facility])   Genital herpes    Gout    HLD (hyperlipidemia)    HTN (hypertension)    Hypocalcemia 09/03/2019   Knee pain, right    needs replacement    Neuropathy    feet   Obesity    Seizures (HCC)    x1 - after craniotomy (1960s)   Weakness of both legs    since back surgery    Past Surgical History:  Procedure Laterality Date   APPENDECTOMY  1958   BRAIN SURGERY  1965   CLOSED REDUCTION CLAVICLE FRACTURE  1955   COLONOSCOPY  02/2008   int hemorrhoids, diverticula (Dr. Jinny)   COLONOSCOPY N/A 08/26/2018   TA, rpt 3 yrs (Vanga, Cygnet)   COLONOSCOPY WITH PROPOFOL  N/A 11/10/2015   diverticulosis, path with microscopic colitis Clair Jinny, MD)   COLONOSCOPY WITH PROPOFOL  N/A 09/26/2021   diverticulosis, rpt 5 yrs (Vanga, Corinn Skiff, MD)   CORONARY ANGIOPLASTY WITH STENT PLACEMENT  2005   stent 2005   DECOMPRESSIVE LUMBAR LAMINECTOMY LEVEL 1  03/2018   herniated L4/5 disc R with free fargment over L4 s/p surgery L4 (Krasinksi)   ESOPHAGOGASTRODUODENOSCOPY N/A 08/26/2018   reactive gastritis with benign biopsies (Vanga, Rohini Reddy)   HERNIA REPAIR  1956   inguinal   JOINT REPLACEMENT  2022   KNEE ARTHROSCOPY  2008   torn meniscus   SPINE SURGERY     SUBDURAL HEMATOMA EVACUATION VIA CRANIOTOMY  1964   hit in helmet by baseball   TOTAL KNEE ARTHROPLASTY Right 11/12/2018   Procedure: TOTAL KNEE ARTHROPLASTY;  Surgeon: Marchia Drivers, MD;  Location: [facility] ORS;  Service: Orthopedics;  Laterality: Right;    Family History  Problem Relation Age of Onset   Diabetes Father 51   Coronary artery disease Sister        catheterizations   COPD Brother    Stroke Brother    Hypertension Brother    Cancer Neg Hx     Social History   Socioeconomic History   Marital status: Married    Spouse name: Not on file   Number of children: Not on file   Years of education: Not on file   Highest education level: Master's degree (e.g., MA, MS, MEng, MEd, MSW, MBA)  Occupational History   Not on file  Tobacco Use   Smoking status: Never   Smokeless tobacco: Never  Vaping Use   Vaping status: Never Used  Substance and  Sexual Activity   Alcohol use: No   Drug use: No   Sexual activity: Not Currently  Other Topics Concern   Not on file  Social History Narrative   Caffeine: 2 cans of diet soda/day   Lives with wife.  1 dog at home.  2 grown children   Occupation: multimedia programmer at [employer]   Edu: Master's degree   Activity: walking some (fit bit)   Diet: good water , fruits/vegetables occasionally, cutting back on carbs   Social Drivers of Health   Tobacco Use: Low Risk (10/25/2024)   Patient History    Smoking Tobacco Use: Never    Smokeless Tobacco Use: Never    Passive Exposure: Not on file  Financial Resource Strain: Low Risk (08/22/2024)   Overall Financial Resource Strain (CARDIA)    Difficulty of Paying Living Expenses: Not very hard  Food Insecurity: No Food Insecurity (08/22/2024)   Epic    Worried About Radiation Protection Practitioner of Food in the Last Year: Never true    Ran Out of Food in the Last Year: Never true  Transportation Needs: No Transportation Needs (08/22/2024)   Epic    Lack of Transportation (Medical): No    Lack of Transportation (Non-Medical): No  Physical Activity: Unknown (08/22/2024)   Exercise Vital Sign    Days of Exercise per Week: Patient declined    Minutes of Exercise per Session: Not on file  Stress: Stress Concern Present (08/22/2024)   [employer] of Occupational Health - Occupational Stress Questionnaire    Feeling of Stress: To some extent  Social Connections: Moderately Isolated (08/22/2024)   Social Connection and Isolation Panel    Frequency of Communication with Friends and Family: More than three times a week    Frequency of Social Gatherings with Friends and Family: Once a week    Attends Religious Services: Patient declined    Active Member of Clubs or Organizations: No    Attends Engineer, Structural: Not on file    Marital Status: Married  Intimate Partner Violence: Not At Risk (08/20/2024)   Epic    Fear of Current or Ex-Partner:  No    Emotionally Abused: No    Physically Abused: No    Sexually Abused: No  Depression (PHQ2-9): Low Risk (09/20/2024)   Depression (PHQ2-9)    PHQ-2 Score: 4  Alcohol Screen: Low Risk (09/04/2022)   Alcohol Screen    Last Alcohol Screening Score (AUDIT): 0  Housing: Low Risk (08/22/2024)   Epic    Unable to Pay for Housing in the Last Year: No    Number of Times Moved in the Last Year: 0    Homeless in the Last Year:  No  Utilities: Not At Risk (08/20/2024)   Epic    Threatened with loss of utilities: No  Health Literacy: Not on file     Prior to Admission medications  Medication Sig Start Date End Date Taking? Authorizing Provider  Accu-Chek Softclix Lancets lancets USE TO CHECK BLOOD GLUCOSE ONE TIME DAILY 10/19/24   [name] Baller, MD  acyclovir  (ZOVIRAX ) 400 MG tablet TAKE 1 TABLET EVERY DAY 03/16/24   [name] Baller, MD  allopurinol  (ZYLOPRIM ) 100 MG tablet Take 1 tablet (100 mg total) by mouth daily. 09/22/24   Alexander, Natalie, DO  aspirin  EC 81 MG tablet Take 1 tablet (81 mg total) by mouth daily. 03/08/19   [name] Baller, MD  atenolol  (TENORMIN ) 25 MG tablet Take 1 tablet (25 mg total) by mouth daily. 03/17/24   [name] Baller, MD  azaTHIOprine  (IMURAN ) 50 MG tablet Take 50 mg by mouth daily. 08/23/24   [provider]  Biotin  1000 MCG tablet Take 1,000 mcg by mouth daily.     [provider]  Blood Glucose Monitoring Suppl (ACCU-CHEK GUIDE ME) w/Device KIT Use as instructed to check blood sugar once a day 12/25/23   [name] Baller, MD  Cholecalciferol  (VITAMIN D3) 25 MCG (1000 UT) CAPS Take 1 capsule (1,000 Units total) by mouth daily. 09/18/22   [name] Baller, MD  Cyanocobalamin  (B-12) 1000 MCG SUBL Place 1 tablet under the tongue once a week. 11/12/23   [name] Baller, MD  glucose blood (ACCU-CHEK GUIDE TEST) test strip Use as instructed to check blood sugar once a day 03/12/24   [name] Baller, MD  insulin  glargine (LANTUS   SOLOSTAR) 100 UNIT/ML Solostar Pen Inject 10 Units into the skin daily. 05/13/24   [name] Baller, MD  Insulin  Pen Needle (PEN NEEDLES) 31G X 5 MM MISC Use as directed to inject insulin  daily 11/12/23   [name] Baller, MD  losartan  (COZAAR ) 100 MG tablet Take 0.5 tablets (50 mg total) by mouth daily. 10/19/24 11/18/24  Alexander, Natalie, DO  Magnesium  500 MG TABS Take 500 mg by mouth daily.    [provider]  [Paused] metFORMIN  (GLUCOPHAGE -XR) 500 MG 24 hr tablet Take 2 tablets (1,000 mg total) by mouth daily with breakfast. Wait to take this until your doctor or other care provider tells you to start again. 03/17/24   [name] Baller, MD  potassium chloride  (KLOR-CON  M) 10 MEQ tablet Take 1 tablet (10 mEq total) by mouth daily. 08/25/24   [name] Baller, MD  tamsulosin  (FLOMAX ) 0.4 MG CAPS capsule Take 1 capsule (0.4 mg total) by mouth daily. 03/17/24   [name] Baller, MD    Allergies[1]  REVIEW OF SYSTEMS:  General: no fevers/chills/night sweats Eyes: no blurry vision, diplopia, or amaurosis ENT: no sore throat or hearing loss Resp: no cough, wheezing, or hemoptysis CV: no edema or palpitations GI: no abdominal pain, nausea, vomiting, diarrhea, or constipation GU: no dysuria, frequency, or hematuria Skin: no rash Neuro: no headache, numbness, tingling, or weakness of extremities Musculoskeletal: no joint pain or swelling Heme: no bleeding, DVT, or easy bruising Endo: no polydipsia or polyuria  There were no vitals taken for this visit.  PHYSICAL EXAM: GEN:  AO x 3 in no acute distress HEENT: Normal Dentition: Normal*** Neck: JVP normal. +2***carotid upstrokes without bruits. No thyromegaly. Lungs: equal expansion, clear bilaterally CV: Apex is discrete and nondisplaced, RRR without murmur or gallop*** Abd: soft, non-tender, non-distended; no bruit; positive bowel sounds Ext: no edema, ecchymoses, or cyanosis Vascular: 2+ femoral pulses, 2+ radial pulses  Skin: warm and dry without rash Neuro: CN II-XII grossly intact; motor and sensory grossly intact    DATA AND STUDIES:  EKG:  EKG Interpretation Date/Time:    Ventricular Rate:    PR Interval:    QRS Duration:    QT Interval:    QTC Calculation:   R Axis:      Text Interpretation:          CARDIAC STUDIES: Refer to CV Procedures and Imaging Tabs  08/17/2024: B Natriuretic Peptide 1,134.2 09/18/2024: Pro Brain Natriuretic Peptide 751.0 09/20/2024: ALT 19; TSH 4.380 09/22/2024: BUN 22; Creatinine, Ser 1.26; Hemoglobin 10.8; Magnesium  1.7; Platelets 170; Potassium 4.0; Sodium 141   STS RISK CALCULATOR: Pending  NYHA CLASS: ***    ASSESSMENT AND PLAN:   1. Nonrheumatic aortic valve stenosis   2. Coronary artery disease involving native coronary artery of native heart without angina pectoris   3. Chronic diastolic heart failure (HCC)   4. Type 2 diabetes mellitus with complication, with long-term current use of insulin  (HCC)   5. Hypertension associated with diabetes (HCC)   6. Hyperlipidemia associated with type 2 diabetes mellitus (HCC)   7. CKD stage 3 due to type 2 diabetes mellitus (HCC)   8. Ulcerative colitis with complication, unspecified location (HCC)   9. BMI 34.0-34.9,adult     Aortic stenosis: Echocardiogram suggest moderate aortic stenosis. CAD: Status post PCI of LAD 2005.  Continue aspirin  81 mg; consider statin. Chronic diastolic heart failure: Continue losartan  50 mg, continue atenolol  25 mg, consider SGLT2 inhibitor. T2DM: Continue aspirin  81 mg, losartan  50 mg, consider statin and SGLT2 inhibitor Hypertension: Continue atenolol  25 mg, losartan  100 mg Hyperlipidemia: Given history of diabetes and coronary artery disease consider statin. CKD stage III: Continue losartan  50 mg; consider SGLT2 inhibitor Ulcerative colitis: Has issues with chronic diarrhea.  Continue azathioprine  50 mg daily Elevated BMI: Given issues with diarrhea may not be a  good candidate for GLP-1.   I have personally reviewed the patients imaging data as summarized above.  I have reviewed the natural history of aortic stenosis with the patient and family members who are present today. We have discussed the limitations of medical therapy and the poor prognosis associated with symptomatic aortic stenosis. We have also reviewed potential treatment options, including palliative medical therapy, conventional surgical aortic valve replacement, and transcatheter aortic valve replacement. We discussed treatment options in the context of this patient's specific comorbid medical conditions.   All of the patient's questions were answered today. Will make further recommendations based on the results of studies outlined above.   I spent *** minutes reviewing all clinical data during and prior to this visit including all relevant imaging studies, laboratories, clinical information from other health systems and prior notes from both Cardiology and other specialties, interviewing the patient, conducting a complete physical examination, and coordinating care in order to formulate a comprehensive and personalized evaluation and treatment plan.   Gionna Polak K Tedrick Port, MD  10/28/2024 8:57 AM    [facility] [address] [place], [place], [place]  [address] Phone: [phone]; Fax: [phone]        [1]  Allergies Allergen Reactions   Gabapentin Other (See Comments)    unknown   Bactrim [Sulfamethoxazole-Trimethoprim] Rash    Diffuse drug reaction - maculopapular rash   Penicillins Hives    Has patient had a PCN reaction causing immediate rash, facial/tongue/throat swelling, SOB or lightheadedness with hypotension: Unknown Has patient had a PCN reaction  causing severe rash involving mucus membranes or skin necrosis: Unknown Has patient had a PCN reaction that required hospitalization: Unknown Has patient had a PCN reaction occurring within the last 10 years:  Unknown If all of the above answers are NO, then may proceed with Cephalosporin use.    Lasix  [Furosemide ] Other (See Comments)    Drops blood pressure and drained potassium and magnesium    Prevnar 20 [Pneumococcal 20-Val Conj Vacc] Rash    Large local reaction at injection site   "

## 2024-10-29 NOTE — Progress Notes (Unsigned)
 "  Patient ID: Thomas Carlson MRN: [id number] DOB/AGE: 1946-09-14 79 y.o.  Primary Care Physician:Gutierrez, Anton, MD Primary Cardiologist: End  CC:  Aortic valvular disease management     FOCUSED PROBLEM LIST:   Aortic stenosis AVA 1.15, MG 28, V-max 3.3, EF 55 to 60% TTE November 2025 EKG sinus rhythm without bundle-branch blocks Coronary artery disease Status post PCI proximal LAD 2005 outside hospital Chronic diastolic heart failure G1 DD, MG 38, EF 55 to 60% TTE November 2025 T2DM On insulin  Hypertension Hyperlipidemia CKD stage IIIa Ulcerative colitis On azathioprine  BMI 34/BSA 2.21 November 2024:  Patient consents to use of AI scribe. The patient is 79 year old male with above listed medical problems referred for recommendations regarding his aortic valvular disease.  The patient was seen in emergency department in November for syncope.  Apparently he was at a football game and felt dizzy and apparently passed out.  He was treated with IV fluids and discharged home.  He was seen by cardiology in late December.  He had no further syncopal events.  He did report exertional dyspnea with higher workloads but not with routine activity.         Past Medical History:  Diagnosis Date   Acute posthemorrhagic anemia    Allergy    Aortic valve sclerosis 08/13/2016   Sclerosis without stenosis by US  (08/2016)   CAD (coronary artery disease) 2005   s/p stent ([place])   Cancer (HCC) 3/25   Cholelithiasis    by CT   Chronic kidney disease, stage 3, mod decreased GFR (HCC)    Diabetes mellitus (HCC) 2010   Diverticulosis    by CT   DJD (degenerative joint disease)    knee   Encephalomalacia without cerebral infarction 03/08/2019   Remote anterior frontal and central MCA encephalomalacia after remote trauma noted on MRI 2020 [place])   Genital herpes    Gout    HLD (hyperlipidemia)    HTN (hypertension)    Hypocalcemia 09/03/2019   Knee pain, right    needs  replacement   Neuropathy    feet   Obesity    Seizures (HCC)    x1 - after craniotomy (1960s)   Weakness of both legs    since back surgery    Past Surgical History:  Procedure Laterality Date   APPENDECTOMY  1958   BRAIN SURGERY  1965   CLOSED REDUCTION CLAVICLE FRACTURE  1955   COLONOSCOPY  02/2008   int hemorrhoids, diverticula (Dr. Jinny)   COLONOSCOPY N/A 08/26/2018   TA, rpt 3 yrs ([place], [place])   COLONOSCOPY WITH PROPOFOL  N/A 11/10/2015   diverticulosis, path with microscopic colitis Clair Jinny, MD)   COLONOSCOPY WITH PROPOFOL  N/A 09/26/2021   diverticulosis, rpt 5 yrs (Vanga, Corinn Skiff, MD)   CORONARY ANGIOPLASTY WITH STENT PLACEMENT  2005   stent 2005   DECOMPRESSIVE LUMBAR LAMINECTOMY LEVEL 1  03/2018   herniated L4/5 disc R with free fargment over L4 s/p surgery L4 ([place])   ESOPHAGOGASTRODUODENOSCOPY N/A 08/26/2018   reactive gastritis with benign biopsies (Vanga, Rohini Reddy)   HERNIA REPAIR  1956   inguinal   JOINT REPLACEMENT  2022   KNEE ARTHROSCOPY  2008   torn meniscus   SPINE SURGERY     SUBDURAL HEMATOMA EVACUATION VIA CRANIOTOMY  1964   hit in helmet by baseball   TOTAL KNEE ARTHROPLASTY Right 11/12/2018   Procedure: TOTAL KNEE ARTHROPLASTY;  Surgeon: Marchia Drivers, MD;  Location: [facility] ORS;  Service:  Orthopedics;  Laterality: Right;    Family History  Problem Relation Age of Onset   Diabetes Father 68   Coronary artery disease Sister        catheterizations   COPD Brother    Stroke Brother    Hypertension Brother    Cancer Neg Hx     Social History   Socioeconomic History   Marital status: Married    Spouse name: Not on file   Number of children: Not on file   Years of education: Not on file   Highest education level: Master's degree (e.g., MA, MS, MEng, MEd, MSW, MBA)  Occupational History   Not on file  Tobacco Use   Smoking status: Never   Smokeless tobacco: Never  Vaping Use   Vaping status: Never Used   Substance and Sexual Activity   Alcohol use: No   Drug use: No   Sexual activity: Not Currently  Other Topics Concern   Not on file  Social History Narrative   Caffeine: 2 cans of diet soda/day   Lives with wife.  1 dog at home.  2 grown children   Occupation: multimedia programmer at [employer]   Edu: Master's degree   Activity: walking some (fit bit)   Diet: good water , fruits/vegetables occasionally, cutting back on carbs   Social Drivers of Health   Tobacco Use: Low Risk (10/25/2024)   Patient History    Smoking Tobacco Use: Never    Smokeless Tobacco Use: Never    Passive Exposure: Not on file  Financial Resource Strain: Low Risk (08/22/2024)   Overall Financial Resource Strain (CARDIA)    Difficulty of Paying Living Expenses: Not very hard  Food Insecurity: No Food Insecurity (08/22/2024)   Epic    Worried About Radiation Protection Practitioner of Food in the Last Year: Never true    Ran Out of Food in the Last Year: Never true  Transportation Needs: No Transportation Needs (08/22/2024)   Epic    Lack of Transportation (Medical): No    Lack of Transportation (Non-Medical): No  Physical Activity: Unknown (08/22/2024)   Exercise Vital Sign    Days of Exercise per Week: Patient declined    Minutes of Exercise per Session: Not on file  Stress: Stress Concern Present (08/22/2024)   [employer] of Occupational Health - Occupational Stress Questionnaire    Feeling of Stress: To some extent  Social Connections: Moderately Isolated (08/22/2024)   Social Connection and Isolation Panel    Frequency of Communication with Friends and Family: More than three times a week    Frequency of Social Gatherings with Friends and Family: Once a week    Attends Religious Services: Patient declined    Active Member of Clubs or Organizations: No    Attends Engineer, Structural: Not on file    Marital Status: Married  Intimate Partner Violence: Not At Risk (08/20/2024)   Epic    Fear of Current  or Ex-Partner: No    Emotionally Abused: No    Physically Abused: No    Sexually Abused: No  Depression (PHQ2-9): Low Risk (09/20/2024)   Depression (PHQ2-9)    PHQ-2 Score: 4  Alcohol Screen: Low Risk (09/04/2022)   Alcohol Screen    Last Alcohol Screening Score (AUDIT): 0  Housing: Low Risk (08/22/2024)   Epic    Unable to Pay for Housing in the Last Year: No    Number of Times Moved in the Last Year: 0    Homeless in the  Last Year: No  Utilities: Not At Risk (08/20/2024)   Epic    Threatened with loss of utilities: No  Health Literacy: Not on file     Prior to Admission medications  Medication Sig Start Date End Date Taking? Authorizing Provider  Accu-Chek Softclix Lancets lancets USE TO CHECK BLOOD GLUCOSE ONE TIME DAILY 10/19/24   [name] Baller, MD  acyclovir  (ZOVIRAX ) 400 MG tablet TAKE 1 TABLET EVERY DAY 03/16/24   [name] Baller, MD  allopurinol  (ZYLOPRIM ) 100 MG tablet Take 1 tablet (100 mg total) by mouth daily. 09/22/24   Alexander, Natalie, DO  aspirin  EC 81 MG tablet Take 1 tablet (81 mg total) by mouth daily. 03/08/19   [name] Baller, MD  atenolol  (TENORMIN ) 25 MG tablet Take 1 tablet (25 mg total) by mouth daily. 03/17/24   [name] Baller, MD  azaTHIOprine  (IMURAN ) 50 MG tablet Take 50 mg by mouth daily. 08/23/24   [provider]  Biotin  1000 MCG tablet Take 1,000 mcg by mouth daily.     [provider]  Blood Glucose Monitoring Suppl (ACCU-CHEK GUIDE ME) w/Device KIT Use as instructed to check blood sugar once a day 12/25/23   [name] Baller, MD  Cholecalciferol  (VITAMIN D3) 25 MCG (1000 UT) CAPS Take 1 capsule (1,000 Units total) by mouth daily. 09/18/22   [name] Baller, MD  Cyanocobalamin  (B-12) 1000 MCG SUBL Place 1 tablet under the tongue once a week. 11/12/23   [name] Baller, MD  glucose blood (ACCU-CHEK GUIDE TEST) test strip Use as instructed to check blood sugar once a day 03/12/24   [name] Baller, MD  insulin  glargine  (LANTUS  SOLOSTAR) 100 UNIT/ML Solostar Pen Inject 10 Units into the skin daily. 05/13/24   [name] Baller, MD  Insulin  Pen Needle (PEN NEEDLES) 31G X 5 MM MISC Use as directed to inject insulin  daily 11/12/23   [name] Baller, MD  losartan  (COZAAR ) 100 MG tablet Take 0.5 tablets (50 mg total) by mouth daily. 10/19/24 11/18/24  Alexander, Natalie, DO  Magnesium  500 MG TABS Take 500 mg by mouth daily.    [provider]  [Paused] metFORMIN  (GLUCOPHAGE -XR) 500 MG 24 hr tablet Take 2 tablets (1,000 mg total) by mouth daily with breakfast. Wait to take this until your doctor or other care provider tells you to start again. 03/17/24   [name] Baller, MD  potassium chloride  (KLOR-CON  M) 10 MEQ tablet Take 1 tablet (10 mEq total) by mouth daily. 08/25/24   [name] Baller, MD  tamsulosin  (FLOMAX ) 0.4 MG CAPS capsule Take 1 capsule (0.4 mg total) by mouth daily. 03/17/24   [name] Baller, MD    Allergies[1]  REVIEW OF SYSTEMS:  General: no fevers/chills/night sweats Eyes: no blurry vision, diplopia, or amaurosis ENT: no sore throat or hearing loss Resp: no cough, wheezing, or hemoptysis CV: no edema or palpitations GI: no abdominal pain, nausea, vomiting, diarrhea, or constipation GU: no dysuria, frequency, or hematuria Skin: no rash Neuro: no headache, numbness, tingling, or weakness of extremities Musculoskeletal: no joint pain or swelling Heme: no bleeding, DVT, or easy bruising Endo: no polydipsia or polyuria  There were no vitals taken for this visit.  PHYSICAL EXAM: GEN:  AO x 3 in no acute distress HEENT: Normal Dentition: Normal*** Neck: JVP normal. +2***carotid upstrokes without bruits. No thyromegaly. Lungs: equal expansion, clear bilaterally CV: Apex is discrete and nondisplaced, RRR without murmur or gallop*** Abd: soft, non-tender, non-distended; no bruit; positive bowel sounds Ext: no edema, ecchymoses, or cyanosis Vascular: 2+ femoral pulses, 2+  radial  pulses       Skin: warm and dry without rash Neuro: CN II-XII grossly intact; motor and sensory grossly intact    DATA AND STUDIES:  EKG:  EKG Interpretation Date/Time:    Ventricular Rate:    PR Interval:    QRS Duration:    QT Interval:    QTC Calculation:   R Axis:      Text Interpretation:          CARDIAC STUDIES: Refer to CV Procedures and Imaging Tabs  08/17/2024: B Natriuretic Peptide 1,134.2 09/18/2024: Pro Brain Natriuretic Peptide 751.0 09/20/2024: ALT 19; TSH 4.380 09/22/2024: BUN 22; Creatinine, Ser 1.26; Hemoglobin 10.8; Magnesium  1.7; Platelets 170; Potassium 4.0; Sodium 141   STS RISK CALCULATOR: Pending  NYHA CLASS: ***    ASSESSMENT AND PLAN:   1. Nonrheumatic aortic valve stenosis   2. Coronary artery disease involving native coronary artery of native heart without angina pectoris   3. Chronic diastolic heart failure (HCC)   4. Type 2 diabetes mellitus with complication, with long-term current use of insulin  (HCC)   5. Hypotension due to hypovolemia   6. Hyperlipidemia associated with type 2 diabetes mellitus (HCC)   7. CKD stage 3 due to type 2 diabetes mellitus (HCC)   8. Ulcerative colitis with complication, unspecified location (HCC)   9. BMI 30.0-30.9,adult      Aortic stenosis: Echocardiogram suggest moderate aortic stenosis.  He does qualify for the PROGRESS CAP program for treatment of moderate aortic stenosis.*** CAD: Status post PCI of LAD 2005.  Continue aspirin  81 mg; consider statin. Chronic diastolic heart failure: Continue losartan  50 mg, continue atenolol  25 mg, consider SGLT2 inhibitor. T2DM: Continue aspirin  81 mg, losartan  50 mg, consider statin and SGLT2 inhibitor Hypertension: Continue atenolol  25 mg, losartan  100 mg Hyperlipidemia: Given history of diabetes and coronary artery disease consider statin. CKD stage III: Continue losartan  50 mg; consider SGLT2 inhibitor Ulcerative colitis: Has issues with chronic diarrhea.   Continue azathioprine  50 mg daily Elevated BMI: Given issues with diarrhea may not be a good candidate for GLP-1.   I have personally reviewed the patients imaging data as summarized above.  I have reviewed the natural history of aortic stenosis with the patient and family members who are present today. We have discussed the limitations of medical therapy and the poor prognosis associated with symptomatic aortic stenosis. We have also reviewed potential treatment options, including palliative medical therapy, conventional surgical aortic valve replacement, and transcatheter aortic valve replacement. We discussed treatment options in the context of this patient's specific comorbid medical conditions.   All of the patient's questions were answered today. Will make further recommendations based on the results of studies outlined above.   I spent *** minutes reviewing all clinical data during and prior to this visit including all relevant imaging studies, laboratories, clinical information from other health systems and prior notes from both Cardiology and other specialties, interviewing the patient, conducting a complete physical examination, and coordinating care in order to formulate a comprehensive and personalized evaluation and treatment plan.   Blaize Epple K Scarlette Hogston, MD  11/01/2024 7:46 AM    [facility] [address] [place], [place], [place]  [address] Phone: [phone]; Fax: [phone]        [1]  Allergies Allergen Reactions   Gabapentin Other (See Comments)    unknown   Bactrim [Sulfamethoxazole-Trimethoprim] Rash    Diffuse drug reaction - maculopapular rash   Penicillins Hives  Has patient had a PCN reaction causing immediate rash, facial/tongue/throat swelling, SOB or lightheadedness with hypotension: Unknown Has patient had a PCN reaction causing severe rash involving mucus membranes or skin necrosis: Unknown Has patient had a PCN reaction that required  hospitalization: Unknown Has patient had a PCN reaction occurring within the last 10 years: Unknown If all of the above answers are NO, then may proceed with Cephalosporin use.    Lasix  [Furosemide ] Other (See Comments)    Drops blood pressure and drained potassium and magnesium    Prevnar 20 [Pneumococcal 20-Val Conj Vacc] Rash    Large local reaction at injection site   "

## 2024-11-01 ENCOUNTER — Other Ambulatory Visit: Payer: Self-pay

## 2024-11-01 DIAGNOSIS — R55 Syncope and collapse: Secondary | ICD-10-CM

## 2024-11-01 DIAGNOSIS — Z87898 Personal history of other specified conditions: Secondary | ICD-10-CM

## 2024-11-01 MED ORDER — POTASSIUM CHLORIDE CRYS ER 10 MEQ PO TBCR: 10.0000 meq | EXTENDED_RELEASE_TABLET | Freq: Every day | ORAL | 3 refills | Status: AC

## 2024-11-01 NOTE — Progress Notes (Signed)
 "  Patient ID: Thomas Carlson MRN: [id number] DOB/AGE: Aug 16, 1946 79 y.o.  Primary Care Physician:Gutierrez, Anton, MD Primary Cardiologist: End  CC:  Aortic valvular disease management     FOCUSED PROBLEM LIST:   Aortic stenosis AVA 1.15, MG 28, V-max 3.3, EF 55 to 60% TTE November 2025 EKG sinus rhythm without bundle-branch blocks Coronary artery disease Status post PCI proximal LAD 2005 outside hospital Chronic diastolic heart failure G1 DD, MG 38, EF 55 to 60% TTE November 2025 T2DM On insulin  Hypertension Hyperlipidemia CKD stage IIIa Ulcerative colitis On azathioprine  BMI 34/BSA 2.21 November 2024:  Patient consents to use of AI scribe. The patient is 79 year old male with above listed medical problems referred for recommendations regarding his aortic valvular disease.  The patient was seen in emergency department in November for syncope.  Apparently he was at a football game and felt dizzy and apparently passed out.  He was treated with IV fluids and discharged home.  He was seen by cardiology in late December.  He had no further syncopal events.  An echocardiogram demonstrated aortic valve indices as detailed above.  He did report exertional dyspnea with higher workloads but not with routine activity.  Since the hospitalization, he has experienced shortness of breath with minimal exertion, such as taking out the trash or climbing stairs. The shortness of breath is more related to the duration of exercise rather than intensity, and it takes about ten minutes to return to baseline. No chest discomfort, lightheadedness, or syncope has occurred since the initial episode. He denies leg swelling, orthopnea, or paroxysmal nocturnal dyspnea.  He reports new onset headaches that start in his ears, which he manages with Tylenol , although it takes some time to relieve the symptoms.  His wife has significantly altered his diet to low sodium following the syncope episode, initially  resulting in a drop in blood pressure. He has since adjusted the sodium intake to stabilize his blood pressure.  He has a sedentary lifestyle due to a bad back, which limits his ability to walk the dog or engage in physical activities.    He has not seen a dentist in some time however he denies any current dental symptoms.   Past Medical History:  Diagnosis Date   Acute posthemorrhagic anemia    Allergy    Aortic valve sclerosis 08/13/2016   Sclerosis without stenosis by US  (08/2016)   CAD (coronary artery disease) 2005   s/p stent ([facility])   Cancer (HCC) 3/25   Cholelithiasis    by CT   Chronic kidney disease, stage 3, mod decreased GFR (HCC)    Diabetes mellitus (HCC) 2010   Diverticulosis    by CT   DJD (degenerative joint disease)    knee   Encephalomalacia without cerebral infarction 03/08/2019   Remote anterior frontal and central MCA encephalomalacia after remote trauma noted on MRI 2020 [facility])   Genital herpes    Gout    HLD (hyperlipidemia)    HTN (hypertension)    Hypocalcemia 09/03/2019   Knee pain, right    needs replacement   Neuropathy    feet   Obesity    Seizures (HCC)    x1 - after craniotomy (1960s)   Weakness of both legs    since back surgery    Past Surgical History:  Procedure Laterality Date   APPENDECTOMY  1958   BRAIN SURGERY  1965   CLOSED REDUCTION CLAVICLE FRACTURE  1955   COLONOSCOPY  02/2008   int  hemorrhoids, diverticula (Dr. Jinny)   COLONOSCOPY N/A 08/26/2018   TA, rpt 3 yrs (Vanga, Corinn Skiff)   COLONOSCOPY WITH PROPOFOL  N/A 11/10/2015   diverticulosis, path with microscopic colitis Clair Jinny, MD)   COLONOSCOPY WITH PROPOFOL  N/A 09/26/2021   diverticulosis, rpt 5 yrs (Vanga, Corinn Skiff, MD)   CORONARY ANGIOPLASTY WITH STENT PLACEMENT  2005   stent 2005   DECOMPRESSIVE LUMBAR LAMINECTOMY LEVEL 1  03/2018   herniated L4/5 disc R with free fargment over L4 s/p surgery L4 (Krasinksi)   ESOPHAGOGASTRODUODENOSCOPY N/A  08/26/2018   reactive gastritis with benign biopsies (Vanga, Rohini Reddy)   HERNIA REPAIR  1956   inguinal   JOINT REPLACEMENT  2022   KNEE ARTHROSCOPY  2008   torn meniscus   SPINE SURGERY     SUBDURAL HEMATOMA EVACUATION VIA CRANIOTOMY  1964   hit in helmet by baseball   TOTAL KNEE ARTHROPLASTY Right 11/12/2018   Procedure: TOTAL KNEE ARTHROPLASTY;  Surgeon: Marchia Drivers, MD;  Location: [facility] ORS;  Service: Orthopedics;  Laterality: Right;    Family History  Problem Relation Age of Onset   Diabetes Father 37   Coronary artery disease Sister        catheterizations   COPD Brother    Stroke Brother    Hypertension Brother    Cancer Neg Hx     Social History   Socioeconomic History   Marital status: Married    Spouse name: Not on file   Number of children: Not on file   Years of education: Not on file   Highest education level: Master's degree (e.g., MA, MS, MEng, MEd, MSW, MBA)  Occupational History   Not on file  Tobacco Use   Smoking status: Never   Smokeless tobacco: Never  Vaping Use   Vaping status: Never Used  Substance and Sexual Activity   Alcohol use: No   Drug use: No   Sexual activity: Not Currently  Other Topics Concern   Not on file  Social History Narrative   Caffeine: 2 cans of diet soda/day   Lives with wife.  1 dog at home.  2 grown children   Occupation: multimedia programmer at [employer]   Edu: Master's degree   Activity: walking some (fit bit)   Diet: good water , fruits/vegetables occasionally, cutting back on carbs   Social Drivers of Health   Tobacco Use: Low Risk (10/25/2024)   Patient History    Smoking Tobacco Use: Never    Smokeless Tobacco Use: Never    Passive Exposure: Not on file  Financial Resource Strain: Low Risk (08/22/2024)   Overall Financial Resource Strain (CARDIA)    Difficulty of Paying Living Expenses: Not very hard  Food Insecurity: No Food Insecurity (08/22/2024)   Epic    Worried About Radiation Protection Practitioner of Food in  the Last Year: Never true    Ran Out of Food in the Last Year: Never true  Transportation Needs: No Transportation Needs (08/22/2024)   Epic    Lack of Transportation (Medical): No    Lack of Transportation (Non-Medical): No  Physical Activity: Unknown (08/22/2024)   Exercise Vital Sign    Days of Exercise per Week: Patient declined    Minutes of Exercise per Session: Not on file  Stress: Stress Concern Present (08/22/2024)   [employer] of Occupational Health - Occupational Stress Questionnaire    Feeling of Stress: To some extent  Social Connections: Moderately Isolated (08/22/2024)   Social Connection and Isolation Panel  Frequency of Communication with Friends and Family: More than three times a week    Frequency of Social Gatherings with Friends and Family: Once a week    Attends Religious Services: Patient declined    Active Member of Clubs or Organizations: No    Attends Engineer, Structural: Not on file    Marital Status: Married  Catering Manager Violence: Not At Risk (08/20/2024)   Epic    Fear of Current or Ex-Partner: No    Emotionally Abused: No    Physically Abused: No    Sexually Abused: No  Depression (PHQ2-9): Low Risk (09/20/2024)   Depression (PHQ2-9)    PHQ-2 Score: 4  Alcohol Screen: Low Risk (09/04/2022)   Alcohol Screen    Last Alcohol Screening Score (AUDIT): 0  Housing: Low Risk (08/22/2024)   Epic    Unable to Pay for Housing in the Last Year: No    Number of Times Moved in the Last Year: 0    Homeless in the Last Year: No  Utilities: Not At Risk (08/20/2024)   Epic    Threatened with loss of utilities: No  Health Literacy: Not on file     Prior to Admission medications  Medication Sig Start Date End Date Taking? Authorizing Provider  Accu-Chek Softclix Lancets lancets USE TO CHECK BLOOD GLUCOSE ONE TIME DAILY 10/19/24   [name] Baller, MD  acyclovir  (ZOVIRAX ) 400 MG tablet TAKE 1 TABLET EVERY DAY 03/16/24   [name] Baller,  MD  allopurinol  (ZYLOPRIM ) 100 MG tablet Take 1 tablet (100 mg total) by mouth daily. 09/22/24   Alexander, Natalie, DO  aspirin  EC 81 MG tablet Take 1 tablet (81 mg total) by mouth daily. 03/08/19   [name] Baller, MD  atenolol  (TENORMIN ) 25 MG tablet Take 1 tablet (25 mg total) by mouth daily. 03/17/24   [name] Baller, MD  azaTHIOprine  (IMURAN ) 50 MG tablet Take 50 mg by mouth daily. 08/23/24   [provider]  Biotin  1000 MCG tablet Take 1,000 mcg by mouth daily.     [provider]  Blood Glucose Monitoring Suppl (ACCU-CHEK GUIDE ME) w/Device KIT Use as instructed to check blood sugar once a day 12/25/23   [name] Baller, MD  Cholecalciferol  (VITAMIN D3) 25 MCG (1000 UT) CAPS Take 1 capsule (1,000 Units total) by mouth daily. 09/18/22   [name] Baller, MD  Cyanocobalamin  (B-12) 1000 MCG SUBL Place 1 tablet under the tongue once a week. 11/12/23   [name] Baller, MD  glucose blood (ACCU-CHEK GUIDE TEST) test strip Use as instructed to check blood sugar once a day 03/12/24   [name] Baller, MD  insulin  glargine (LANTUS  SOLOSTAR) 100 UNIT/ML Solostar Pen Inject 10 Units into the skin daily. 05/13/24   [name] Baller, MD  Insulin  Pen Needle (PEN NEEDLES) 31G X 5 MM MISC Use as directed to inject insulin  daily 11/12/23   [name] Baller, MD  losartan  (COZAAR ) 100 MG tablet Take 0.5 tablets (50 mg total) by mouth daily. 10/19/24 11/18/24  Alexander, Natalie, DO  Magnesium  500 MG TABS Take 500 mg by mouth daily.    [provider]  [Paused] metFORMIN  (GLUCOPHAGE -XR) 500 MG 24 hr tablet Take 2 tablets (1,000 mg total) by mouth daily with breakfast. Wait to take this until your doctor or other care provider tells you to start again. 03/17/24   [name] Baller, MD  potassium chloride  (KLOR-CON  M) 10 MEQ tablet Take 1 tablet (10 mEq total) by mouth daily. 08/25/24   [name] Baller, MD  tamsulosin  (FLOMAX ) 0.4 MG CAPS capsule Take 1 capsule (0.4 mg total) by  mouth daily. 03/17/24   [name] Baller, MD    Allergies[1]  REVIEW OF SYSTEMS:  General: no fevers/chills/night sweats Eyes: no blurry vision, diplopia, or amaurosis ENT: no sore throat or hearing loss Resp: no cough, wheezing, or hemoptysis CV: no edema or palpitations GI: no abdominal pain, nausea, vomiting, diarrhea, or constipation GU: no dysuria, frequency, or hematuria Skin: no rash Neuro: no headache, numbness, tingling, or weakness of extremities Musculoskeletal: no joint pain or swelling Heme: no bleeding, DVT, or easy bruising Endo: no polydipsia or polyuria  BP 128/75 (BP Location: Left Arm)   Pulse (!) 56   Ht 5' 8 (1.727 m)   Wt 231 lb (104.8 kg)   SpO2 97%   BMI 35.12 kg/m   PHYSICAL EXAM: GEN:  AO x 3 in no acute distress HEENT: Normal Dentition: Grossly intact Neck: JVP normal. +2 carotid upstrokes without bruits. No thyromegaly. Lungs: equal expansion, clear bilaterally CV: Apex is discrete and nondisplaced, RRR with 3 out of 6 systolic murmur Abd: soft, non-tender, non-distended; no bruit; positive bowel sounds Ext: no edema, ecchymoses, or cyanosis Vascular: 2+ femoral pulses, 2+ radial pulses       Skin: warm and dry without rash Neuro: CN II-XII grossly intact; motor and sensory grossly intact    DATA AND STUDIES:  EKG:  December 2025 sinus rhythm without bundle-branch blocks  EKG Interpretation Date/Time:    Ventricular Rate:    PR Interval:    QRS Duration:    QT Interval:    QTC Calculation:   R Axis:      Text Interpretation:          CARDIAC STUDIES: Refer to CV Procedures and Imaging Tabs  08/17/2024: B Natriuretic Peptide 1,134.2 09/18/2024: Pro Brain Natriuretic Peptide 751.0 09/20/2024: ALT 19; TSH 4.380 09/22/2024: BUN 22; Creatinine, Ser 1.26; Hemoglobin 10.8; Magnesium  1.7; Platelets 170; Potassium 4.0; Sodium 141   STS RISK CALCULATOR: Pending  NYHA CLASS: 2  CCS CLASS: 1    ASSESSMENT AND PLAN:   1.  Nonrheumatic aortic valve stenosis   2. Coronary artery disease involving native coronary artery of native heart without angina pectoris   3. Chronic diastolic heart failure (HCC)   4. Type 2 diabetes mellitus with complication, with long-term current use of insulin  (HCC)   5. Hypertension associated with diabetes (HCC)   6. Hyperlipidemia associated with type 2 diabetes mellitus (HCC)   7. CKD stage 3 due to type 2 diabetes mellitus (HCC)   8. Ulcerative colitis with complication, unspecified location (HCC)   9. BMI 34.0-34.9,adult       Aortic stenosis: Echocardiogram suggest moderate aortic stenosis.  He does qualify for the [employer] research program for treatment of moderate aortic stenosis with TAVR.  He endorses NYHA II class symptoms of dyspnea.  He has had no exertional angina.  He is interested in pursuing treatment.  I will have our research division reach out to him regarding participation in the continuing access program. CAD: Status post PCI of LAD 2005.  Continue aspirin  81 mg; consider statin. Chronic diastolic heart failure: Continue losartan  50 mg, continue atenolol  25 mg, consider SGLT2i. T2DM: Continue aspirin  81 mg, losartan  50 mg, consider statin and SGLT2i Hypertension: Continue atenolol  25 mg, losartan  100 mg Hyperlipidemia: Given history of diabetes and coronary artery disease consider statin. CKD stage III: Continue losartan  50 mg; consider SGLT2i Ulcerative colitis: Has issues with chronic diarrhea.  Continue azathioprine  50 mg daily Elevated BMI: Given issues with diarrhea, may not be a good candidate for GLP-1.   I have personally reviewed the patients imaging data as summarized above.  I have reviewed the natural history of aortic stenosis with the patient and family members who are present today. We have discussed the limitations of medical therapy and the poor prognosis associated with symptomatic aortic stenosis. We have also reviewed potential treatment  options, including palliative medical therapy, conventional surgical aortic valve replacement, and transcatheter aortic valve replacement. We discussed treatment options in the context of this patient's specific comorbid medical conditions.   All of the patient's questions were answered today. Will make further recommendations based on the results of studies outlined above.   I spent 41 minutes reviewing all clinical data during and prior to this visit including all relevant imaging studies, laboratories, clinical information from other health systems and prior notes from both Cardiology and other specialties, interviewing the patient, conducting a complete physical examination, and coordinating care in order to formulate a comprehensive and personalized evaluation and treatment plan.   Averyanna Sax K Avalie Oconnor, MD  11/11/2024 4:30 PM    [facility] [address] [place], [place], [place]  [address] Phone: [phone]; Fax: [phone]           [1]  Allergies Allergen Reactions   Gabapentin Other (See Comments)    unknown   Bactrim [Sulfamethoxazole-Trimethoprim] Rash    Diffuse drug reaction - maculopapular rash   Penicillins Hives    Has patient had a PCN reaction causing immediate rash, facial/tongue/throat swelling, SOB or lightheadedness with hypotension: Unknown Has patient had a PCN reaction causing severe rash involving mucus membranes or skin necrosis: Unknown Has patient had a PCN reaction that required hospitalization: Unknown Has patient had a PCN reaction occurring within the last 10 years: Unknown If all of the above answers are NO, then may proceed with Cephalosporin use.    Lasix  [Furosemide ] Other (See Comments)    Drops blood pressure and drained potassium and magnesium    Prevnar 20 [Pneumococcal 20-Val Conj Vacc] Rash    Large local reaction at injection site   "

## 2024-11-02 ENCOUNTER — Ambulatory Visit: Payer: Self-pay | Admitting: Internal Medicine

## 2024-11-03 ENCOUNTER — Ambulatory Visit: Admitting: Internal Medicine

## 2024-11-03 DIAGNOSIS — E118 Type 2 diabetes mellitus with unspecified complications: Secondary | ICD-10-CM

## 2024-11-03 DIAGNOSIS — E1159 Type 2 diabetes mellitus with other circulatory complications: Secondary | ICD-10-CM

## 2024-11-03 DIAGNOSIS — K51919 Ulcerative colitis, unspecified with unspecified complications: Secondary | ICD-10-CM

## 2024-11-03 DIAGNOSIS — E1169 Type 2 diabetes mellitus with other specified complication: Secondary | ICD-10-CM

## 2024-11-03 DIAGNOSIS — I350 Nonrheumatic aortic (valve) stenosis: Secondary | ICD-10-CM

## 2024-11-03 DIAGNOSIS — I5032 Chronic diastolic (congestive) heart failure: Secondary | ICD-10-CM

## 2024-11-03 DIAGNOSIS — I2510 Atherosclerotic heart disease of native coronary artery without angina pectoris: Secondary | ICD-10-CM

## 2024-11-03 DIAGNOSIS — Z6834 Body mass index (BMI) 34.0-34.9, adult: Secondary | ICD-10-CM

## 2024-11-03 DIAGNOSIS — E1122 Type 2 diabetes mellitus with diabetic chronic kidney disease: Secondary | ICD-10-CM

## 2024-11-05 ENCOUNTER — Ambulatory Visit: Admitting: Internal Medicine

## 2024-11-11 ENCOUNTER — Ambulatory Visit: Attending: Internal Medicine | Admitting: Internal Medicine

## 2024-11-11 ENCOUNTER — Encounter: Payer: Self-pay | Admitting: Internal Medicine

## 2024-11-11 VITALS — BP 128/75 | HR 56 | Ht 68.0 in | Wt 231.0 lb

## 2024-11-11 DIAGNOSIS — Z6834 Body mass index (BMI) 34.0-34.9, adult: Secondary | ICD-10-CM | POA: Diagnosis not present

## 2024-11-11 DIAGNOSIS — E1159 Type 2 diabetes mellitus with other circulatory complications: Secondary | ICD-10-CM | POA: Diagnosis not present

## 2024-11-11 DIAGNOSIS — I350 Nonrheumatic aortic (valve) stenosis: Secondary | ICD-10-CM | POA: Diagnosis not present

## 2024-11-11 DIAGNOSIS — E785 Hyperlipidemia, unspecified: Secondary | ICD-10-CM | POA: Diagnosis not present

## 2024-11-11 DIAGNOSIS — E1169 Type 2 diabetes mellitus with other specified complication: Secondary | ICD-10-CM | POA: Diagnosis not present

## 2024-11-11 DIAGNOSIS — I2510 Atherosclerotic heart disease of native coronary artery without angina pectoris: Secondary | ICD-10-CM

## 2024-11-11 DIAGNOSIS — K51919 Ulcerative colitis, unspecified with unspecified complications: Secondary | ICD-10-CM

## 2024-11-11 DIAGNOSIS — I5032 Chronic diastolic (congestive) heart failure: Secondary | ICD-10-CM | POA: Diagnosis not present

## 2024-11-11 DIAGNOSIS — I152 Hypertension secondary to endocrine disorders: Secondary | ICD-10-CM | POA: Diagnosis not present

## 2024-11-11 DIAGNOSIS — E118 Type 2 diabetes mellitus with unspecified complications: Secondary | ICD-10-CM | POA: Diagnosis not present

## 2024-11-11 DIAGNOSIS — E1122 Type 2 diabetes mellitus with diabetic chronic kidney disease: Secondary | ICD-10-CM

## 2024-11-11 DIAGNOSIS — N183 Chronic kidney disease, stage 3 unspecified: Secondary | ICD-10-CM

## 2024-11-11 DIAGNOSIS — Z794 Long term (current) use of insulin: Secondary | ICD-10-CM | POA: Diagnosis not present

## 2024-11-11 NOTE — Progress Notes (Addendum)
 Pre Surgical Assessment: 5 M Walk Test  55M=16.17ft  5 Meter Walk Test- trial 1: 7.53 seconds 5 Meter Walk Test- trial 2: 5.80 seconds 5 Meter Walk Test- trial 3: 5.86 seconds 5 Meter Walk Test Average: 6.4 seconds   ___________________________  Procedure Type: Isolated AVR Perioperative Outcome Estimate % Operative Mortality 4.19% Morbidity & Mortality 12.3% Stroke 1.53% Renal Failure 3.38% Reoperation 3.41% Prolonged Ventilation 6.53% Deep Sternal Wound Infection 0.124% Long Hospital Stay (>14 days) 6.73% Short Hospital Stay (<6 days)* 32.1%

## 2024-11-11 NOTE — Patient Instructions (Signed)
 Medication Instructions:  No changes *If you need a refill on your cardiac medications before your next appointment, please call your pharmacy*  Lab Work: None ordered If you have labs (blood work) drawn today and your tests are completely normal, you will receive your results only by: MyChart Message (if you have MyChart) OR A paper copy in the mail If you have any lab test that is abnormal or we need to change your treatment, we will call you to review the results.  Testing/Procedures: None ordered  Follow-Up: At [facility], you and your health needs are our priority.  As part of our continuing mission to provide you with exceptional heart care, our providers are all part of one team.  This team includes your primary Cardiologist (physician) and Advanced Practice Providers or APPs (Physician Assistants and Nurse Practitioners) who all work together to provide you with the care you need, when you need it.  Your next appointment:   Amy will reach out to the patient for follow up due to being a research patient.   Provider:   Dr Wendel  We recommend signing up for the patient portal called MyChart.  Sign up information is provided on this After Visit Summary.  MyChart is used to connect with patients for Virtual Visits (Telemedicine).  Patients are able to view lab/test results, encounter notes, upcoming appointments, etc.  Non-urgent messages can be sent to your provider as well.   To learn more about what you can do with MyChart, go to [email or web address].

## 2024-11-12 DIAGNOSIS — Z006 Encounter for examination for normal comparison and control in clinical research program: Secondary | ICD-10-CM

## 2024-11-12 NOTE — Research (Signed)
 Orders only

## 2024-11-12 NOTE — Addendum Note (Signed)
 Addended by: ARDEAN NO B on: 11/12/2024 12:11 PM   Modules accepted: Orders

## 2024-11-19 ENCOUNTER — Ambulatory Visit (HOSPITAL_COMMUNITY)
Admission: RE | Admit: 2024-11-19 | Discharge: 2024-11-19 | Disposition: A | Payer: Self-pay | Source: Ambulatory Visit | Attending: Internal Medicine

## 2024-11-19 DIAGNOSIS — Z006 Encounter for examination for normal comparison and control in clinical research program: Secondary | ICD-10-CM

## 2024-11-19 NOTE — Progress Notes (Signed)
" °  Echocardiogram 2D Echocardiogram has been performed.  Koleen KANDICE Popper, RDCS 11/19/2024, 3:14 PM "

## 2024-11-21 LAB — ECHOCARDIOGRAM COMPLETE
AR max vel: 0.44 cm2
AV Area VTI: 0.48 cm2
AV Area mean vel: 0.43 cm2
AV Mean grad: 39 mmHg
AV Peak grad: 63.7 mmHg
Ao pk vel: 3.99 m/s
Area-P 1/2: 2 cm2
Calc EF: 57.2 %
MV M vel: 3.33 m/s
MV Peak grad: 44.4 mmHg
P 1/2 time: 152 ms
S' Lateral: 4 cm
Single Plane A2C EF: 58 %
Single Plane A4C EF: 56 %

## 2024-11-22 ENCOUNTER — Encounter

## 2024-11-22 ENCOUNTER — Ambulatory Visit (HOSPITAL_COMMUNITY)

## 2024-11-22 ENCOUNTER — Encounter (HOSPITAL_COMMUNITY): Payer: Self-pay

## 2024-11-22 ENCOUNTER — Ambulatory Visit: Payer: Self-pay | Admitting: Internal Medicine

## 2024-11-23 NOTE — Research (Addendum)
 Progress CAP Informed Consent      Subject Name: Thomas Carlson  Subject met inclusion and exclusion criteria.  The informed consent form, study requirements and expectations were reviewed with the subject and questions and concerns were addressed prior to the signing of the consent form. The shared decision making tool was reviewed with the subject. The subject verbalized understanding of the trial requirements.  The subject agreed to participate in the Progress CAP trial and signed the informed consent at 1330 on 11/19/2024.  The informed consent was obtained prior to performance of any protocol-specific procedures for the subject.  A copy of the signed informed consent was given to the subject and a copy was placed in the subject's medical record.   Kaeden Mester    INCLUSION:  [x]  80 years of age or older at time of randomization  [x]   Moderate AS per one of the following as assessed by the Echo Core Lab:    A: Aortic valve area (AVA) / Aortic valve area index (AVAi):   AVA 1.0 - 1.5 cm2; OR   AVA < 1.0 with AVAi > 0.6 cm2/m2 if BMI < 30 kg/m2; OR   AVA < 1.0 with AVAi > 0.5 cm2/m2 if BMI >= 30 kg/m2, OR   AVA > 1.5 with AVAi < 0.9 cm2/m2 if BMI < 30 kg/m2; OR   AVA > 1.5 with AVAi < 0.8 cm2/m2 if BMI >= 30 kg/m2   AND  Peak velocity 3.0 - < 4.0 m/s OR mean gradient 20 - < 40 mmHg  OR  B: Subjects who only meet one of the criteria in 2A on resting echo due to low flow state (LVEF < 50% or SVI < 35 mL/m2) are eligible if both criteria are met following dobutamine stress echo (DSE). Note: Subjects with inconclusive results from DSE are eligible upon approval by the Case Review Board.  OR  C: Subjects who only meet one of the criteria in 2A on resting echo and have normal flow (LVEF >= 50% and SVi >= 35 mL/m2) are eligible if non-contrast CT calcium  score is < 1200 AU for women or < 2000 AU for men.  [x]   Subject meets at least one of the following criteria:   Valve-related symptoms  including [place]  Heart Association functional class >= II, dyspnea, angina, dizziness, pre-syncope, or syncope deemed to be related to AS   LVEF < 60% as assessed by the Echo Core Lab   Diastolic dysfunction (>= Grade 2) as assessed by the Echo Core Lab per American Society of Echocardiography Guidelines1 (i.e., E/e' > 14, left atrium volume index > 34 mL/m2, or tricuspid regurgitation velocity > 2.8 m/sec)   Stroke volume index < 35 mL/m2 as assessed by the Echo Core Lab  Persistent atrial fibrillation or any episode of paroxysmal atrial fibrillation within 6 months prior to randomization   NT-Pro BNP > 3x normal   Elevated calcium  score as assessed by Computed Tomography (CT) Core Lab (> 1200 AU for male and > 2000 AU for male) (only applicable for subjects eligible per 2A or 2B above)  [x]  The subject or subject's legal representative has been informed of the nature of the study, agrees to its provisions, and has provided written informed consent   EXCLUSION []  Native aortic annulus size unsuitable for the THV based on computed tomography angiography (CTA) analysis  []  Anatomical characteristics that would preclude safe placement of the introducer sheath or safe passage of the delivery system  []   Aortic valve is unicuspid or non-calcified  []  Bicuspid aortic valve with an aneurysmal ascending aorta > 4.5 cm or severe raphe/leaflet calcification as assessed by CT Core Lab  []  Pre-existing mechanical or bioprosthetic aortic valve  []  Severe aortic regurgitation (>3+)  []  Prior balloon aortic valvuloplasty (BAV) to treat severe AS  []  LVEF < 20% as assessed by the Echo Core Lab  []  Left ventricular outflow tract (LVOT) calcification that would increase the risk of annular rupture or significant paravalvular leak (PVL) post-TAVR  []  Cardiac imaging evidence of intracardiac mass, thrombus, or vegetation  []  Coronary or aortic valve anatomy that increases the risk of coronary artery obstruction  post-TAVR  []  Myocardial infarction within 30 days prior to randomization  []  Hypertrophic cardiomyopathy with sub-valvular obstruction or restrictive cardiomyopathy  []  Any concomitant valvular disease requiring surgical or transcatheter intervention.  []  Significant untreated coronary artery disease requiring revascularization  []  Any surgical or transcatheter procedure within 30 days prior to randomization  []  Active bacterial endocarditis within 180 days prior to randomization  []  Stroke or transient ischemic attack (TIA) within 90 days prior to randomization  []  Symptomatic carotid or vertebral artery disease or successful treatment of carotid stenosis within 30 days prior to randomization  []  Severe chronic obstructive pulmonary disease (COPD, Forced Ejection Volume 1 [FEV1] < 50% predicted) or currently on home oxygen  []  Hemodynamic or respiratory instability requiring inotropic or mechanical support within 30 days prior to randomization  []  Liver disease (cirrhosis of the liver [Child-Pugh class B or C])  []  Renal insufficiency (estimated Glomerular Filtration Rate [eGFR] < 30 mL/min/1.73 m2) and/or renal replacement therapy  []  Significant frailty as determined by the Heart Team  []  Leukopenia (White blood cells < 3000 cells/mL), anemia (Hemoglobin < 9 g/dL), thrombocytopenia (platelets < 50,000 cells/mL)  []  Inability to tolerate or condition precluding treatment with antithrombotic therapy (including single antiplatelet therapy)  []  Hypercoagulable state or other condition that increases risk of thrombosis  []  Absolute contraindications or allergy to iodinated contrast that cannot be adequately treated with pre-medication  []  Subject refuses blood products  []  Body mass index (BMI) > 50 kg/m2  []  Estimated life expectancy < 24 months  []  Active SARS-CoV-2 infection (Coronavirus-19 [COVID-19]) or previously diagnosed with COVID-19 with sequelae that could confound endpoint assessments  (as assessed by the Case Review Board)  []  Participating in another drug or device study that has not reached its primary endpoint  []  Subject considered to be part of a vulnerable population    Progress Screening Visit   Medical History:  CAD (coronary artery disease) 2005    s/p stent ([facility])   Cancer (HCC) 3/25   Cholelithiasis      by CT   Chronic kidney disease, stage 3, mod decreased GFR (HCC)     Diabetes mellitus (HCC) 2010   Diverticulosis      by CT   DJD (degenerative joint disease)      knee   Encephalomalacia without cerebral infarction 03/08/2019    Remote anterior frontal and central MCA encephalomalacia after remote trauma noted on MRI 2020 [facility])   Genital herpes     Gout     HLD (hyperlipidemia)     HTN (hypertension)     Hypocalcemia 09/03/2019   Knee pain, right      needs replacement   Neuropathy      feet   Obesity     Seizures (HCC)      x1 - after  craniotomy (1960s)   Weakness of both legs      Medications: Current Medications[1]    Society of Thoracic Surgeons Score: Pending  European System for Cardiac Operative Risk Evaluation (EuroSCORE) ll: Pending  Surgical Risk assessed by Heart Team:  []  - Low []  - Intermediate []  - High []  - Extreme   [place]  Heart Association  (NYHA) ll  Canadian Cardiovascular Society Score (CCS) angina grade: l   Transthoracic echocardiogram (TTE), including DSE as applicable: 11/19/2024   12- lead electrocardiogram:    Cardiac CTA with 3D within 1 year: Pending   PFT: N/A  NIHSS MRS Completed 11/30/2024  Labs: See labs  KCCQ: See worksheet   [id number]-5D-5L: See worksheet   SF-36:  See worksheet        [1]  Current Outpatient Medications:    Accu-Chek Softclix Lancets lancets, USE TO CHECK BLOOD GLUCOSE ONE TIME DAILY, Disp: 100 each, Rfl: 0   acyclovir  (ZOVIRAX ) 400 MG tablet, TAKE 1 TABLET EVERY DAY, Disp: 90 tablet, Rfl: 3   allopurinol  (ZYLOPRIM ) 100 MG tablet,  Take 1 tablet (100 mg total) by mouth daily., Disp: , Rfl:    aspirin  EC 81 MG tablet, Take 1 tablet (81 mg total) by mouth daily., Disp: , Rfl:    atenolol  (TENORMIN ) 25 MG tablet, Take 1 tablet (25 mg total) by mouth daily., Disp: 90 tablet, Rfl: 4   azaTHIOprine  (IMURAN ) 50 MG tablet, Take 50 mg by mouth daily., Disp: , Rfl:    Biotin  1000 MCG tablet, Take 1,000 mcg by mouth daily. , Disp: , Rfl:    Blood Glucose Monitoring Suppl (ACCU-CHEK GUIDE ME) w/Device KIT, Use as instructed to check blood sugar once a day, Disp: 1 kit, Rfl: 0   Cholecalciferol  (VITAMIN D3) 25 MCG (1000 UT) CAPS, Take 1 capsule (1,000 Units total) by mouth daily., Disp: 30 capsule, Rfl:    Cyanocobalamin  (B-12) 1000 MCG SUBL, Place 1 tablet under the tongue once a week., Disp: , Rfl:    glucose blood (ACCU-CHEK GUIDE TEST) test strip, Use as instructed to check blood sugar once a day, Disp: 100 each, Rfl: 2   insulin  glargine (LANTUS  SOLOSTAR) 100 UNIT/ML Solostar Pen, Inject 10 Units into the skin daily., Disp: 15 mL, Rfl: 2   Insulin  Pen Needle (PEN NEEDLES) 31G X 5 MM MISC, Use as directed to inject insulin  daily, Disp: 300 each, Rfl: 3   losartan  (COZAAR ) 100 MG tablet, Take 0.5 tablets (50 mg total) by mouth daily., Disp: 45 tablet, Rfl: 2   Magnesium  500 MG TABS, Take 500 mg by mouth daily., Disp: , Rfl:    [Paused] metFORMIN  (GLUCOPHAGE -XR) 500 MG 24 hr tablet, Take 2 tablets (1,000 mg total) by mouth daily with breakfast., Disp: 180 tablet, Rfl: 4   potassium chloride  (KLOR-CON  M) 10 MEQ tablet, Take 1 tablet (10 mEq total) by mouth daily., Disp: 90 tablet, Rfl: 3   rosuvastatin  (CRESTOR ) 20 MG tablet, Take 20 mg by mouth at bedtime., Disp: , Rfl:    tamsulosin  (FLOMAX ) 0.4 MG CAPS capsule, Take 1 capsule (0.4 mg total) by mouth daily., Disp: 90 capsule, Rfl: 4

## 2024-11-30 ENCOUNTER — Observation Stay
Admission: EM | Admit: 2024-11-30 | Discharge: 2024-12-03 | Disposition: A | Source: Home / Self Care | Attending: Emergency Medicine | Admitting: Emergency Medicine

## 2024-11-30 ENCOUNTER — Other Ambulatory Visit: Payer: Self-pay

## 2024-11-30 ENCOUNTER — Encounter: Payer: Self-pay | Admitting: Internal Medicine

## 2024-11-30 ENCOUNTER — Encounter

## 2024-11-30 ENCOUNTER — Observation Stay

## 2024-11-30 VITALS — BP 129/61 | HR 56 | Resp 14

## 2024-11-30 DIAGNOSIS — D696 Thrombocytopenia, unspecified: Secondary | ICD-10-CM | POA: Insufficient documentation

## 2024-11-30 DIAGNOSIS — E1169 Type 2 diabetes mellitus with other specified complication: Secondary | ICD-10-CM | POA: Diagnosis present

## 2024-11-30 DIAGNOSIS — I2510 Atherosclerotic heart disease of native coronary artery without angina pectoris: Secondary | ICD-10-CM

## 2024-11-30 DIAGNOSIS — N2889 Other specified disorders of kidney and ureter: Secondary | ICD-10-CM | POA: Insufficient documentation

## 2024-11-30 DIAGNOSIS — E559 Vitamin D deficiency, unspecified: Secondary | ICD-10-CM | POA: Diagnosis present

## 2024-11-30 DIAGNOSIS — N1831 Chronic kidney disease, stage 3a: Secondary | ICD-10-CM | POA: Diagnosis present

## 2024-11-30 DIAGNOSIS — R55 Syncope and collapse: Principal | ICD-10-CM | POA: Diagnosis present

## 2024-11-30 DIAGNOSIS — M109 Gout, unspecified: Secondary | ICD-10-CM | POA: Diagnosis present

## 2024-11-30 DIAGNOSIS — E1129 Type 2 diabetes mellitus with other diabetic kidney complication: Secondary | ICD-10-CM | POA: Diagnosis present

## 2024-11-30 DIAGNOSIS — I350 Nonrheumatic aortic (valve) stenosis: Secondary | ICD-10-CM

## 2024-11-30 DIAGNOSIS — Z006 Encounter for examination for normal comparison and control in clinical research program: Secondary | ICD-10-CM

## 2024-11-30 DIAGNOSIS — I5032 Chronic diastolic (congestive) heart failure: Secondary | ICD-10-CM | POA: Diagnosis present

## 2024-11-30 LAB — CBC WITH DIFFERENTIAL/PLATELET
Abs Immature Granulocytes: 0.02 10*3/uL (ref 0.00–0.07)
Basophils Absolute: 0 10*3/uL (ref 0.0–0.1)
Basophils Relative: 1 %
Eosinophils Absolute: 0.1 10*3/uL (ref 0.0–0.5)
Eosinophils Relative: 1 %
HCT: 33.2 % — ABNORMAL LOW (ref 39.0–52.0)
Hemoglobin: 11.3 g/dL — ABNORMAL LOW (ref 13.0–17.0)
Immature Granulocytes: 0 %
Lymphocytes Relative: 21 %
Lymphs Abs: 1.1 10*3/uL (ref 0.7–4.0)
MCH: 35.2 pg — ABNORMAL HIGH (ref 26.0–34.0)
MCHC: 34 g/dL (ref 30.0–36.0)
MCV: 103.4 fL — ABNORMAL HIGH (ref 80.0–100.0)
Monocytes Absolute: 0.4 10*3/uL (ref 0.1–1.0)
Monocytes Relative: 8 %
Neutro Abs: 3.6 10*3/uL (ref 1.7–7.7)
Neutrophils Relative %: 69 %
Platelets: 156 10*3/uL (ref 150–400)
RBC: 3.21 MIL/uL — ABNORMAL LOW (ref 4.22–5.81)
RDW: 16.3 % — ABNORMAL HIGH (ref 11.5–15.5)
WBC: 5.2 10*3/uL (ref 4.0–10.5)
nRBC: 0 % (ref 0.0–0.2)

## 2024-11-30 LAB — URINALYSIS, COMPLETE (UACMP) WITH MICROSCOPIC
Bacteria, UA: NONE SEEN
Bilirubin Urine: NEGATIVE
Glucose, UA: NEGATIVE mg/dL
Hgb urine dipstick: NEGATIVE
Ketones, ur: NEGATIVE mg/dL
Leukocytes,Ua: NEGATIVE
Nitrite: NEGATIVE
Protein, ur: NEGATIVE mg/dL
Specific Gravity, Urine: 1.015 (ref 1.005–1.030)
pH: 5 (ref 5.0–8.0)

## 2024-11-30 LAB — BASIC METABOLIC PANEL WITH GFR
Anion gap: 10 (ref 5–15)
BUN: 26 mg/dL — ABNORMAL HIGH (ref 8–23)
CO2: 23 mmol/L (ref 22–32)
Calcium: 8.8 mg/dL — ABNORMAL LOW (ref 8.9–10.3)
Chloride: 109 mmol/L (ref 98–111)
Creatinine, Ser: 1.6 mg/dL — ABNORMAL HIGH (ref 0.61–1.24)
GFR, Estimated: 44 mL/min — ABNORMAL LOW
Glucose, Bld: 111 mg/dL — ABNORMAL HIGH (ref 70–99)
Potassium: 4.9 mmol/L (ref 3.5–5.1)
Sodium: 142 mmol/L (ref 135–145)

## 2024-11-30 LAB — TROPONIN T, HIGH SENSITIVITY
Troponin T High Sensitivity: 35 ng/L — ABNORMAL HIGH (ref 0–19)
Troponin T High Sensitivity: 32 ng/L — ABNORMAL HIGH (ref 0–19)

## 2024-11-30 LAB — TSH: TSH: 2.91 u[IU]/mL (ref 0.350–4.500)

## 2024-11-30 LAB — MAGNESIUM: Magnesium: 2 mg/dL (ref 1.7–2.4)

## 2024-11-30 LAB — CBG MONITORING, ED: Glucose-Capillary: 92 mg/dL (ref 70–99)

## 2024-11-30 MED ORDER — LACTATED RINGERS IV SOLN: INTRAVENOUS | Status: DC

## 2024-11-30 MED ORDER — ONDANSETRON HCL 4 MG PO TABS: 4.0000 mg | ORAL_TABLET | Freq: Four times a day (QID) | ORAL | Status: DC | PRN

## 2024-11-30 MED ORDER — INSULIN ASPART 100 UNIT/ML IJ SOLN: 0.0000 [IU] | Freq: Every day | INTRAMUSCULAR | Status: DC

## 2024-11-30 MED ORDER — SODIUM CHLORIDE 0.9 % IV BOLUS
500.0000 mL | Freq: Once | INTRAVENOUS | Status: AC
  Administered 2024-11-30: 500 mL via INTRAVENOUS

## 2024-11-30 MED ORDER — INSULIN ASPART 100 UNIT/ML IJ SOLN: 0.0000 [IU] | Freq: Three times a day (TID) | INTRAMUSCULAR | Status: DC

## 2024-11-30 MED ORDER — ONDANSETRON HCL 4 MG/2ML IJ SOLN: 4.0000 mg | Freq: Four times a day (QID) | INTRAMUSCULAR | Status: DC | PRN

## 2024-11-30 MED ORDER — HEPARIN SODIUM (PORCINE) 5000 UNIT/ML IJ SOLN
5000.0000 [IU] | Freq: Three times a day (TID) | INTRAMUSCULAR | Status: DC
  Administered 2024-11-30 – 2024-12-03 (×7): 5000 [IU] via SUBCUTANEOUS
  Filled 2024-11-30 (×7): qty 1

## 2024-11-30 MED ORDER — SENNOSIDES-DOCUSATE SODIUM 8.6-50 MG PO TABS: 1.0000 | ORAL_TABLET | Freq: Every evening | ORAL | Status: DC | PRN

## 2024-11-30 MED ORDER — ACETAMINOPHEN 325 MG PO TABS
650.0000 mg | ORAL_TABLET | Freq: Four times a day (QID) | ORAL | Status: DC | PRN
  Administered 2024-12-02 – 2024-12-03 (×2): 650 mg via ORAL
  Filled 2024-11-30 (×2): qty 2

## 2024-11-30 MED ORDER — ACETAMINOPHEN 650 MG RE SUPP: 650.0000 mg | Freq: Four times a day (QID) | RECTAL | Status: DC | PRN

## 2024-11-30 MED ORDER — SODIUM CHLORIDE 0.9% FLUSH
3.0000 mL | Freq: Two times a day (BID) | INTRAVENOUS | Status: DC
  Administered 2024-12-02: 3 mL via INTRAVENOUS

## 2024-11-30 NOTE — ED Notes (Signed)
 CCMD notified of cardiac monitoring order.

## 2024-11-30 NOTE — ED Triage Notes (Signed)
 Pt was in the car OTW home from a pro-op appointment when he experienced a syncopal episode while sitting down. No injuries. EMS reports normal VS and awake and oriented on their arrival. Pt reports dizziness.

## 2024-11-30 NOTE — ED Notes (Signed)
 Warm blanket provided. Family departing and will return.

## 2024-12-01 DIAGNOSIS — D696 Thrombocytopenia, unspecified: Secondary | ICD-10-CM | POA: Insufficient documentation

## 2024-12-01 LAB — COMPREHENSIVE METABOLIC PANEL WITH GFR
ALT: 17 [IU]/L (ref 0–44)
AST: 20 [IU]/L (ref 0–40)
Albumin: 4.5 g/dL (ref 3.8–4.8)
Alkaline Phosphatase: 76 [IU]/L (ref 47–123)
BUN/Creatinine Ratio: 17 (ref 10–24)
BUN: 25 mg/dL (ref 8–27)
Bilirubin Total: 0.3 mg/dL (ref 0.0–1.2)
CO2: 21 mmol/L (ref 20–29)
Calcium: 9.2 mg/dL (ref 8.6–10.2)
Chloride: 105 mmol/L (ref 96–106)
Creatinine, Ser: 1.48 mg/dL — ABNORMAL HIGH (ref 0.76–1.27)
Globulin, Total: 1.7 g/dL (ref 1.5–4.5)
Glucose: 108 mg/dL — ABNORMAL HIGH (ref 70–99)
Potassium: 5.1 mmol/L (ref 3.5–5.2)
Sodium: 140 mmol/L (ref 134–144)
Total Protein: 6.2 g/dL (ref 6.0–8.5)
eGFR: 48 mL/min/{1.73_m2} — ABNORMAL LOW

## 2024-12-01 LAB — CBC
HCT: 32.5 % — ABNORMAL LOW (ref 39.0–52.0)
Hematocrit: 39.7 % (ref 37.5–51.0)
Hemoglobin: 13.2 g/dL (ref 13.0–17.7)
Hemoglobin: 11 g/dL — ABNORMAL LOW (ref 13.0–17.0)
MCH: 35.2 pg — ABNORMAL HIGH (ref 26.6–33.0)
MCH: 35 pg — ABNORMAL HIGH (ref 26.0–34.0)
MCHC: 33.2 g/dL (ref 31.5–35.7)
MCHC: 33.8 g/dL (ref 30.0–36.0)
MCV: 106 fL — ABNORMAL HIGH (ref 79–97)
MCV: 103.5 fL — ABNORMAL HIGH (ref 80.0–100.0)
Platelets: 188 10*3/uL (ref 150–450)
Platelets: 142 10*3/uL — ABNORMAL LOW (ref 150–400)
RBC: 3.75 x10E6/uL — ABNORMAL LOW (ref 4.14–5.80)
RBC: 3.14 MIL/uL — ABNORMAL LOW (ref 4.22–5.81)
RDW: 16 % — ABNORMAL HIGH (ref 11.6–15.4)
RDW: 16.1 % — ABNORMAL HIGH (ref 11.5–15.5)
WBC: 4.5 10*3/uL (ref 3.4–10.8)
WBC: 3.8 10*3/uL — ABNORMAL LOW (ref 4.0–10.5)
nRBC: 0 % (ref 0.0–0.2)

## 2024-12-01 LAB — CBG MONITORING, ED
Glucose-Capillary: 92 mg/dL (ref 70–99)
Glucose-Capillary: 90 mg/dL (ref 70–99)
Glucose-Capillary: 76 mg/dL (ref 70–99)
Glucose-Capillary: 84 mg/dL (ref 70–99)

## 2024-12-01 LAB — BASIC METABOLIC PANEL WITH GFR
Anion gap: 11 (ref 5–15)
BUN: 25 mg/dL — ABNORMAL HIGH (ref 8–23)
CO2: 21 mmol/L — ABNORMAL LOW (ref 22–32)
Calcium: 8.6 mg/dL — ABNORMAL LOW (ref 8.9–10.3)
Chloride: 108 mmol/L (ref 98–111)
Creatinine, Ser: 1.4 mg/dL — ABNORMAL HIGH (ref 0.61–1.24)
GFR, Estimated: 51 mL/min — ABNORMAL LOW
Glucose, Bld: 93 mg/dL (ref 70–99)
Potassium: 4.3 mmol/L (ref 3.5–5.1)
Sodium: 141 mmol/L (ref 135–145)

## 2024-12-01 LAB — PRO B NATRIURETIC PEPTIDE: NT-Pro BNP: 5915 pg/mL — ABNORMAL HIGH (ref 0–486)

## 2024-12-01 MED ORDER — LOSARTAN POTASSIUM 50 MG PO TABS
50.0000 mg | ORAL_TABLET | Freq: Every day | ORAL | Status: DC
  Administered 2024-12-01: 50 mg via ORAL
  Filled 2024-12-01: qty 1

## 2024-12-01 MED ORDER — ASPIRIN 81 MG PO TBEC
81.0000 mg | DELAYED_RELEASE_TABLET | Freq: Every day | ORAL | Status: DC
  Administered 2024-12-03: 81 mg via ORAL
  Filled 2024-12-01: qty 1

## 2024-12-01 MED ORDER — LOSARTAN POTASSIUM 50 MG PO TABS
50.0000 mg | ORAL_TABLET | Freq: Two times a day (BID) | ORAL | Status: DC
  Administered 2024-12-01: 50 mg via ORAL
  Filled 2024-12-01: qty 1

## 2024-12-01 MED ORDER — HYDRALAZINE HCL 20 MG/ML IJ SOLN: 10.0000 mg | Freq: Four times a day (QID) | INTRAMUSCULAR | Status: DC | PRN

## 2024-12-01 MED ORDER — ROSUVASTATIN CALCIUM 20 MG PO TABS
20.0000 mg | ORAL_TABLET | Freq: Every day | ORAL | Status: DC
  Administered 2024-12-02: 20 mg via ORAL
  Filled 2024-12-01: qty 2

## 2024-12-01 NOTE — Evaluation (Signed)
 Physical Therapy Evaluation Patient Details Name: Thomas Carlson MRN: [id number] DOB: 30-Jun-1946 Today's Date: 12/01/2024  History of Present Illness  Pt is a 78 y/o M admitted on 11/30/24 after presenting with c/o syncopal episode. PMH: HTN, HLD, DM2, CHF, aortic stenosis, CAD, DJD, encephalomalacia without cerebral infarction, gout, neuropathy, obesity, seizures, craniotomy (1960s)  Clinical Impression  Pt seen for PT evaluation with pt agreeable to tx. Pt reports prior to admission he was independent without AD, denies falls, driving. On this date, pt is able to ambulate in room, hallway without AD with mod I, no LOB. Pt without c/o symptoms during session. Pt with drop in systolic & diastolic BP with position changes but denies symptoms. At this time, pt does not require acute PT services. PT to complete current orders at this time.     BP checked in RUE  BP  Supine  164/77 mmHg MAP 101  Sitting 122/56 mmHg MAP 77  Standing at 0 117/58 mmHg MAP 72  Standing after gait 132/64 mmHg MAP 85        If plan is discharge home, recommend the following:     Can travel by private vehicle        Equipment Recommendations None recommended by PT  Recommendations for Other Services       Functional Status Assessment Patient has not had a recent decline in their functional status     Precautions / Restrictions Precautions Precautions: None Restrictions Weight Bearing Restrictions Per Provider Order: No      Mobility  Bed Mobility Overal bed mobility: Modified Independent                  Transfers Overall transfer level: Independent                      Ambulation/Gait Ambulation/Gait assistance: Modified independent (Device/Increase time) Gait Distance (Feet): 150 Feet Assistive device: None Gait Pattern/deviations: Decreased step length - right, Decreased step length - left, Decreased stride length Gait velocity: decreased     General Gait Details: no  overt LOB  Stairs            Wheelchair Mobility     Tilt Bed    Modified Rankin (Stroke Patients Only)       Balance Overall balance assessment: Needs assistance   Sitting balance-Leahy Scale: Good     Standing balance support: During functional activity, No upper extremity supported Standing balance-Leahy Scale: Good Standing balance comment: standing continent void without LOB                             Pertinent Vitals/Pain Pain Assessment Pain Assessment: No/denies pain    Home Living Family/patient expects to be discharged to:: Private residence Living Arrangements: Spouse/significant other Available Help at Discharge: Family Type of Home: House Home Access: Stairs to enter Entrance Stairs-Rails: Right;Left;Can reach both Entrance Stairs-Number of Steps: 2 Alternate Level Stairs-Number of Steps: 14 Home Layout: Two level;Bed/bath upstairs;Able to live on main level with bedroom/bathroom (main bed/bath on 2nd level) Home Equipment: None      Prior Function Prior Level of Function : Independent/Modified Independent             Mobility Comments: independent without AD, denies falls, driving ADLs Comments: independent, enjoys counted cross stitch     Extremity/Trunk Assessment   Upper Extremity Assessment Upper Extremity Assessment: Overall WFL for tasks assessed    Lower Extremity  Assessment Lower Extremity Assessment: Overall WFL for tasks assessed       Communication   Communication Communication: No apparent difficulties    Cognition Arousal: Alert Behavior During Therapy: WFL for tasks assessed/performed   PT - Cognitive impairments: No apparent impairments                         Following commands: Intact       Cueing Cueing Techniques: Verbal cues     General Comments      Exercises     Assessment/Plan    PT Assessment Patient does not need any further PT services  PT Problem List          PT Treatment Interventions      PT Goals (Current goals can be found in the Care Plan section)  Acute Rehab PT Goals Patient Stated Goal: none stated PT Goal Formulation: All assessment and education complete, DC therapy Time For Goal Achievement: 12/15/24 Potential to Achieve Goals: Good    Frequency       Co-evaluation               AM-PAC PT 6 Clicks Mobility  Outcome Measure Help needed turning from your back to your side while in a flat bed without using bedrails?: None Help needed moving from lying on your back to sitting on the side of a flat bed without using bedrails?: None Help needed moving to and from a bed to a chair (including a wheelchair)?: None Help needed standing up from a chair using your arms (e.g., wheelchair or bedside chair)?: None Help needed to walk in hospital room?: None Help needed climbing 3-5 steps with a railing? : None 6 Click Score: 24    End of Session   Activity Tolerance: Patient tolerated treatment well Patient left: in bed;with call bell/phone within reach;with bed alarm set Nurse Communication: Mobility status      Time: 8975-8961 PT Time Calculation (min) (ACUTE ONLY): 14 min   Charges:   PT Evaluation $PT Eval Low Complexity: 1 Low   PT General Charges $$ ACUTE PT VISIT: 1 Visit         Richerd Pinal, PT, DPT 12/01/24, 10:55 AM   Richerd CHRISTELLA Pinal 12/01/2024, 10:51 AM

## 2024-12-01 NOTE — H&P (View-Only) (Signed)
 "  Cardiology Consultation   Patient ID: Thomas Carlson MRN: [id number]; DOB: Mar 09, 1946  Admit date: 11/30/2024 Date of Consult: 12/01/2024  PCP:  Thomas Baller, MD   Thomas Carlson Cardiologist:  Thomas Hanson, MD        Patient Profile: Thomas Carlson is a 79 y.o. male with a hx of aortic stenosis and regurgitation, CAD with PCI to the proximal LAD 2005, hypertension, hyperlipidemia, type 2 diabetes, and remote seizure in the setting of subdermal hematoma  who is being seen 12/01/2024 for the evaluation of recurrent syncope at the request of Thomas Carlson.  History of Present Illness:   Thomas Carlson was previously followed at [facility] clinic by Thomas Carlson.  He had LHC in 04/2004 with high-grade stenosis of the proximal s/p PCI/DES.  Echo 09/2022 showed EF 50 to 55% with mild LVH, G1 DD, mild MR, and severely calcified aortic valve with mild regurgitation and moderate stenosis.  He established with Thomas Carlson 11/2022 after referral from his PCP with these echo findings.  Echo 03/2024 revealed EF 55 to 60%, G1 DD, mild MR, mild AI, and moderate to severe aortic valve stenosis with VTI 1.69 cm and mean gradient 30 mmHg.   Patient was seen in the GI clinic for routine visit 08/17/2024 where his blood pressure was noted to be 208/74.  He was sent to urgent care for further evaluation where he reported taking his medications as prescribed that morning.  BP in urgent care was 218/82 and he was noted to have lower extremity swelling.  Chest x-ray showed trace bilateral pleural effusions.  He was subsequently transferred to the emergency department for further evaluation.  BP in the ED was 214/93.  Pro BNP 1134.  Troponin minimally elevated and flat trending at 33 greater than 36.  EKG without acute ischemic changes.  He was given hydralazine  20 mg IV and diuresed with 40 mg IV Lasix .  BP improved and he was discharged with new prescriptions for hydralazine  and hydrochlorothiazide .  These were  in addition to PTA losartan  and atenolol .  He again presented to the ED 09/18/2024 after an episode of syncope in the setting of diarrhea. He was found to be hypotensive with BP 84/36 and labs were consistent with dehydration. He was given IV fluids with normalization of BP and later discharged. He followed with with his PCP 11/24 and was again found to be hypotensive with BP 70/40 and referred to the ED for further evaluation where BP had improved to 103/62. He noted that he had not been taking hydralazine  but had been compliant with losartan , atenolol , and hydrochlorothiazide . Labs showed persistent AKI, although improved from prior ED visit. Echo showed EF 55-60% with moderate to severe aortic valve stenosis (VTI 1.15 and mean gradient 28.7). Syncope was felt to be in the setting of diarrhea, overmedication, and aortic stenosis. Hydralazine  and hydrochlorothiazide  were held and he was discharged in stable condition on 09/22/2024. Heart monitor showed predominately sinus rhythm with no evidence of arrhythmia or pause to explain syncope.   He was seen by the structural heart team 11/11/2024 and felt to be a candidate for the PROGRESS CAP research program for treatment of moderate AS with TAVR. Echo 11/19/2024 showed progression of aortic valve stenosis to severe with VTI 0.48 and mean gradient 39.     Patient had an appointment yesterday with the valve research team and was feeling well up until the car ride home. He had been eating and drinking normally. His  family member who was driving noted that he was quiet and looked over to check on him and noticed he had gone unconscious, noting his jaw to be flack with eyes closed. She also reports that he was gasping for air intermittently. She called EMS who found the patient to be awake and oriented with normal vital signs. In the ED, BP 116/50 with HR 56 bpm and otherwise normal vital signs. Labs show dehydration with Cr 1.6 and BUN 26. CBC shows Hgb 11.3, HCT  33.2. Troponin 35 > 32. EKG without acute ischemic changes. He was given IVF and atenolol  was held due to bradycardia. Renal US  revealed a hyperechoic vascular mass in the upper pole of the left kidney. Cardiology was asked to consult for further evaluation of syncope in the setting of severe aortic stenosis. At time of cardiology consult, patient feels back to baseline.   Past Medical History:  Diagnosis Date   Acute posthemorrhagic anemia    Allergy    Aortic valve sclerosis 08/13/2016   Sclerosis without stenosis by US  (08/2016)   CAD (coronary artery disease) 2005   s/p stent (Thomas Carlson)   Cancer (HCC) 3/25   Cholelithiasis    by CT   Chronic kidney disease, stage 3, mod decreased GFR (HCC)    Diabetes mellitus (HCC) 2010   Diverticulosis    by CT   DJD (degenerative joint disease)    knee   Encephalomalacia without cerebral infarction 03/08/2019   Remote anterior frontal and central MCA encephalomalacia after remote trauma noted on MRI 2020 Thomas Carlson)   Genital herpes    Gout    HLD (hyperlipidemia)    HTN (hypertension)    Hypocalcemia 09/03/2019   Knee pain, right    needs replacement   Neuropathy    feet   Obesity    Seizures (HCC)    x1 - after craniotomy (1960s)   Weakness of both legs    since back surgery    Past Surgical History:  Procedure Laterality Date   APPENDECTOMY  1958   BRAIN SURGERY  1965   CLOSED REDUCTION CLAVICLE FRACTURE  1955   COLONOSCOPY  02/2008   int hemorrhoids, diverticula (Dr. Jinny)   COLONOSCOPY N/A 08/26/2018   TA, rpt 3 yrs (Thomas Carlson, Thomas Carlson)   COLONOSCOPY WITH PROPOFOL  N/A 11/10/2015   diverticulosis, path with microscopic colitis Thomas Jinny, MD)   COLONOSCOPY WITH PROPOFOL  N/A 09/26/2021   diverticulosis, rpt 5 yrs (Thomas Carlson, Thomas Skiff, MD)   CORONARY ANGIOPLASTY WITH STENT PLACEMENT  2005   stent 2005   DECOMPRESSIVE LUMBAR LAMINECTOMY LEVEL 1  03/2018   herniated L4/5 disc R with free fargment over L4 s/p surgery L4  (Thomas Carlson)   ESOPHAGOGASTRODUODENOSCOPY N/A 08/26/2018   reactive gastritis with benign biopsies (Thomas Carlson, Thomas Carlson)   HERNIA REPAIR  1956   inguinal   JOINT REPLACEMENT  2022   KNEE ARTHROSCOPY  2008   torn meniscus   SPINE SURGERY     SUBDURAL HEMATOMA EVACUATION VIA CRANIOTOMY  1964   hit in helmet by baseball   TOTAL KNEE ARTHROPLASTY Right 11/12/2018   Procedure: TOTAL KNEE ARTHROPLASTY;  Surgeon: Marchia Drivers, MD;  Location: [facility] ORS;  Service: Orthopedics;  Laterality: Right;       Scheduled Meds:  heparin   5,000 Units Subcutaneous Q8H   insulin  aspart  0-5 Units Subcutaneous QHS   insulin  aspart  0-9 Units Subcutaneous TID WC   sodium chloride  flush  3 mL Intravenous Q12H   Continuous Infusions:  lactated ringers  75 mL/hr at 11/30/24 1808   PRN Meds: acetaminophen  **OR** acetaminophen , ondansetron  **OR** ondansetron  (ZOFRAN ) IV, senna-docusate  Allergies:   Allergies[1]  Social History:   Social History   Socioeconomic History   Marital status: Married    Spouse name: Not on file   Number of children: Not on file   Years of education: Not on file   Highest education level: Master's degree (e.g., MA, MS, MEng, MEd, MSW, MBA)  Occupational History   Not on file  Tobacco Use   Smoking status: Never   Smokeless tobacco: Never  Vaping Use   Vaping status: Never Used  Substance and Sexual Activity   Alcohol use: No   Drug use: No   Sexual activity: Not Currently  Other Topics Concern   Not on file  Social History Narrative   Caffeine: 2 cans of diet soda/day   Lives with wife.  1 dog at home.  2 grown children   Occupation: multimedia programmer at [employer]   Edu: Master's degree   Activity: walking some (fit bit)   Diet: good water , fruits/vegetables occasionally, cutting back on carbs   Social Drivers of Health   Tobacco Use: Low Risk (11/30/2024)   Patient History    Smoking Tobacco Use: Never    Smokeless Tobacco Use: Never    Passive  Exposure: Not on file  Financial Resource Strain: Low Risk (08/22/2024)   Overall Financial Resource Strain (CARDIA)    Difficulty of Paying Living Expenses: Not very hard  Food Insecurity: No Food Insecurity (08/22/2024)   Epic    Worried About Radiation Protection Practitioner of Food in the Last Year: Never true    Ran Out of Food in the Last Year: Never true  Transportation Needs: No Transportation Needs (08/22/2024)   Epic    Lack of Transportation (Medical): No    Lack of Transportation (Non-Medical): No  Physical Activity: Unknown (08/22/2024)   Exercise Vital Sign    Days of Exercise per Week: Patient declined    Minutes of Exercise per Session: Not on file  Stress: Stress Concern Present (08/22/2024)   [employer] of Occupational Health - Occupational Stress Questionnaire    Feeling of Stress: To some extent  Social Connections: Moderately Isolated (08/22/2024)   Social Connection and Isolation Panel    Frequency of Communication with Friends and Family: More than three times a week    Frequency of Social Gatherings with Friends and Family: Once a week    Attends Religious Services: Patient declined    Active Member of Clubs or Organizations: No    Attends Engineer, Structural: Not on file    Marital Status: Married  Catering Manager Violence: Not At Risk (08/20/2024)   Epic    Fear of Current or Ex-Partner: No    Emotionally Abused: No    Physically Abused: No    Sexually Abused: No  Depression (PHQ2-9): Low Risk (09/20/2024)   Depression (PHQ2-9)    PHQ-2 Score: 4  Alcohol Screen: Low Risk (09/04/2022)   Alcohol Screen    Last Alcohol Screening Score (AUDIT): 0  Housing: Low Risk (08/22/2024)   Epic    Unable to Pay for Housing in the Last Year: No    Number of Times Moved in the Last Year: 0    Homeless in the Last Year: No  Utilities: Not At Risk (08/20/2024)   Epic    Threatened with loss of utilities: No  Health Literacy: Not on file  Family History:     Family History  Problem Relation Age of Onset   Diabetes Father 76   Coronary artery disease Sister        catheterizations   COPD Brother    Stroke Brother    Hypertension Brother    Cancer Neg Hx      ROS:  Please see the history of present illness.    Physical Exam/Data: Vitals:   12/01/24 0031 12/01/24 0400 12/01/24 0815 12/01/24 0819  BP: 137/68 130/66 134/68   Pulse: (!) 57 68 60   Resp: 15 11 14    Temp: 98.7 F (37.1 C) 98.3 F (36.8 C)  98.3 F (36.8 C)  TempSrc: Oral Oral  Oral  SpO2: 100% 96% 99%   Weight:      Height:        Intake/Output Summary (Last 24 hours) at 12/01/2024 0835 Last data filed at 11/30/2024 1216 Gross per 24 hour  Intake 300 ml  Output --  Net 300 ml      11/30/2024   12:18 PM 11/11/2024    4:09 PM 10/25/2024    3:00 PM  Last 3 Weights  Weight (lbs) 234 lb 231 lb 231 lb 6.4 oz  Weight (kg) 106.142 kg 104.781 kg 104.962 kg     Body mass index is 35.58 kg/m.  General:  Well nourished, well developed, in no acute distress HEENT: normal Neck: no JVD Vascular: No carotid bruits; Distal pulses 2+ bilaterally Cardiac:  normal S1, S2; RRR; III/VI systolic murmur  Lungs:  clear to auscultation bilaterally, no wheezing, rhonchi or rales  Abd: soft, nontender, no hepatomegaly  Ext: no edema Skin: warm and dry  Psych:  Normal affect   EKG:  The EKG was personally reviewed and demonstrates: Sinus bradycardia with nonspecific T wave abnormality, heart rate 54 bpm Telemetry:  Telemetry was personally reviewed and demonstrates:  sinus bradycardia/sinus rhythm with occasional PVCs  Relevant CV Studies:  11/19/2024 2D echo 1. Left ventricular ejection fraction, by estimation, is 55 to 60%. The  left ventricle has normal function. The left ventricle has no regional  wall motion abnormalities. There is mild concentric left ventricular  hypertrophy. Left ventricular diastolic  parameters are consistent with Grade I diastolic dysfunction  (impaired  relaxation).   2. Right ventricular systolic function is normal. The right ventricular  size is normal. There is normal pulmonary artery systolic pressure.   3. Left atrial size was mildly dilated.   4. The mitral valve is normal in structure. Trivial mitral valve  regurgitation. No evidence of mitral stenosis.   5. The aortic valve is calcified. There is severe calcifcation of the  aortic valve. There is severe thickening of the aortic valve. Aortic valve  regurgitation is moderate. Severe aortic valve stenosis. Aortic valve  area, by VTI measures 0.48 cm. Aortic   valve mean gradient measures 39.0 mmHg. Aortic valve Vmax measures 3.99  m/s.   6. The inferior vena cava is normal in size with greater than 50%  respiratory variability, suggesting right atrial pressure of 3 mmHg.   10/2024 Long term monitor Predominantly sinus rhythm with rare PACs and occasional PVCs as well as a single brief episode  of NSVT and a few nonsustained supraventricular runs.   Laboratory Data: High Sensitivity Troponin:  No results for input(s): TROPONINIHS in the last 720 hours.  Recent Labs  Lab 11/30/24 1333 11/30/24 1511  TRNPT 35* 32*      Chemistry Recent Labs  Lab 11/30/24  1030 11/30/24 1333 12/01/24 0406  NA 140 142 141  K 5.1 4.9 4.3  CL 105 109 108  CO2 21 23 21*  GLUCOSE 108* 111* 93  BUN 25 26* 25*  CREATININE 1.48* 1.60* 1.40*  CALCIUM  9.2 8.8* 8.6*  MG  --  2.0  --   GFRNONAA  --  44* 51*  ANIONGAP  --  10 11    Recent Labs  Lab 11/30/24 1030  PROT 6.2  ALBUMIN 4.5  AST 20  ALT 17  ALKPHOS 76  BILITOT 0.3   Lipids No results for input(s): CHOL, TRIG, HDL, LABVLDL, LDLCALC, CHOLHDL in the last 168 hours.  Hematology Recent Labs  Lab 11/30/24 1030 11/30/24 1333 12/01/24 0406  WBC 4.5 5.2 3.8*  RBC 3.75* 3.21* 3.14*  HGB 13.2 11.3* 11.0*  HCT 39.7 33.2* 32.5*  MCV 106* 103.4* 103.5*  MCH 35.2* 35.2* 35.0*  MCHC 33.2 34.0 33.8  RDW  16.0* 16.3* 16.1*  PLT 188 156 142*   Thyroid   Recent Labs  Lab 11/30/24 1511  TSH 2.910    BNP Recent Labs  Lab 11/30/24 1030  PROBNP 5,915*    DDimer No results for input(s): DDIMER in the last 168 hours.  Radiology/Studies:  US  RENAL Result Date: 11/30/2024 IMPRESSION: 1. 2.8 mildly hyperechoic vascular mass in the upper pole of the left kidney. Recommend further characterization with renal protocol CT or MRI with and without IV contrast. Electronically signed by: Norman Gatlin MD 11/30/2024 10:31 PM EST RP Workstation: [id number]   Assessment and Plan:  Recurrent syncope Severe aortic valve stenosis - Prior admission in 08/2024 for an episode of syncope which was felt to be secondary to dehydration and overmedication in the setting of aortic stenosis - Recent echo 11/19/2024 showed preserved LV systolic function with progression of aortic stenosis to the severe range with VTI 0.48 cm and mean gradient 39 mmHg - Now presenting with recurrence of syncope - Labs indicate slight dehydration - Etiology of syncope is unclear - Hold atenolol  in the setting of bradycardia - Continue to monitor on telemetry while admitted. May need repeat Zio monitor on discharge.  - Recommend proceeding with cardiac catheterization to rule out ischemic cause of syncope, tentatively scheduled for tomorrow  AKI - BUN/creatinine up from baseline - Gentle IV hydration  Coronary artery disease - No chest pain - Troponin minimally elevated and flat trending - Resume PTA aspirin  and statin  - R/LHC as above  Hypertension - BP elevated, resume home antihypertensives aside from atenolol   HFpEF - Echo with 10/2024 with EF 55-60% - Appears euvolemic on exam. No acute exacerbation - Recommend slight positive fluid balance, monitor closely    Informed Consent   Shared Decision Making/Informed Consent The risks, including but not limited to, [bleeding or vascular complications (1 in 500),  pneumothorax (1 in 1600), arrhythmia (1 in 1000) and death (1 in 5000)], benefits (diagnostic support and/or management of heart failure, pulmonary hypertension) and alternatives of a right heart catheterization were discussed in detail with Thomas Carlson and he is willing to proceed. The risks [stroke (1 in 1000), death (1 in 1000), kidney failure [usually temporary] (1 in 500), bleeding (1 in 200), allergic reaction [possibly serious] (1 in 200)], benefits (diagnostic support and management of coronary artery disease) and alternatives of a cardiac catheterization were discussed in detail with Thomas Carlson and he is willing to proceed.       For questions or updates, please contact [facility] Please consult  [email or web address] for contact info under      Signed, Lesley LITTIE Maffucci, PA-C  12/01/2024 8:35 AM     [1]  Allergies Allergen Reactions   Gabapentin Other (See Comments)    unknown   Bactrim [Sulfamethoxazole-Trimethoprim] Rash    Diffuse drug reaction - maculopapular rash   Penicillins Hives    Has patient had a PCN reaction causing immediate rash, facial/tongue/throat swelling, SOB or lightheadedness with hypotension: Unknown Has patient had a PCN reaction causing severe rash involving mucus membranes or skin necrosis: Unknown Has patient had a PCN reaction that required hospitalization: Unknown Has patient had a PCN reaction occurring within the last 10 years: Unknown If all of the above answers are NO, then may proceed with Cephalosporin use.    Lasix  [Furosemide ] Other (See Comments)    Drops blood pressure and drained potassium and magnesium    Prevnar 20 [Pneumococcal 20-Val Conj Vacc] Rash    Large local reaction at injection site   "

## 2024-12-01 NOTE — Hospital Course (Signed)
 Thomas Carlson is a 79 y.o. year old male with medical history of hypertension, hyperlipidemia, type 2 diabetes, CHF (EF 50%, G1DD), moderate-severe aortic stenosis (aortic valve gradient at 39 mmHg and V-max at 3.99 m/s) presenting to the ED after a syncopal episode.  Patient is seen by cardiology, scheduled for heart cath tomorrow.

## 2024-12-01 NOTE — ED Notes (Signed)
 Pt provided with lotion for hands. Call bell in reach. No other needs requested at this time.

## 2024-12-01 NOTE — ED Notes (Signed)
 Pt ambulated to bathroom, non slip socks on pt feet. Denies any pain. Pt declined getting into gown and preferred to keep his clothing on.

## 2024-12-01 NOTE — Progress Notes (Signed)
" °  Progress Note   Patient: Thomas Carlson FMW:[id number] DOB: [birth year] DOA: 11/30/2024     0 DOS: the patient was seen and examined on 12/01/2024   Brief hospital course: TENZIN EDELMAN is a 79 y.o. year old male with medical history of hypertension, hyperlipidemia, type 2 diabetes, CHF (EF 50%, G1DD), moderate-severe aortic stenosis (aortic valve gradient at 39 mmHg and V-max at 3.99 m/s) presenting to the ED after a syncopal episode.  Patient is seen by cardiology, scheduled for heart cath tomorrow.   Principal Problem:   Syncope Active Problems:   Moderate to severe aortic stenosis(on echo 03/2024)   CAD with history of LAD stent(2005)   Chronic heart failure with preserved ejection fraction (HFpEF, >= 50%) (HCC)   Hyperlipidemia associated with type 2 diabetes mellitus (HCC)   Type II diabetes mellitus with renal manifestations (HCC)   Gout   CKD stage 3a, GFR 45-59 ml/min (HCC)   Vitamin D  deficiency   Syncope and collapse   Thrombocytopenia   Assessment and Plan: Syncope and collapse secondary to severe aortic stenosis. Severe aortic stenosis with moderate aortic regurgitation. Seen by cardiology, syncope was due to severe aortic stenosis.  This is second time patient had syncope in the past 2 months.  Patient has been evaluated for TEVAR, cardiology is planning for heart cath tomorrow.  Chronic diastolic congestive heart failure. Patient has elevation in proBNP, but clinically patient is euvolemic.  Chronic kidney disease stage IIIa. Continue to follow.  Class II obesity with BMI 35.58 Diet and exercise.  Type 2 diabetes. Glucose not elevated.  Mild thrombocytopenia. Continue to follow    Subjective:  Patient doing well today, no chest pain.  No dizziness.  Physical Exam: Vitals:   12/01/24 1141 12/01/24 1200 12/01/24 1400 12/01/24 1514  BP: (!) 174/67 (!) 173/63 (!) 158/49   Pulse: 65 64 (!) 58   Resp: 18 17 15    Temp: 97.7 F (36.5 C)   97.8 F (36.6  C)  TempSrc: Oral   Oral  SpO2: 100% 100% 100%   Weight:      Height:       General exam: Appears calm and comfortable  Respiratory system: Clear to auscultation. Respiratory effort normal. Cardiovascular system: S1 & S2 heard, RRR. No JVD, has 2/6 systolic murmurs,  No pedal edema. Gastrointestinal system: Abdomen is nondistended, soft and nontender. No organomegaly or masses felt. Normal bowel sounds heard. Central nervous system: Alert and oriented. No focal neurological deficits. Extremities: Symmetric 5 x 5 power. Skin: No rashes, lesions or ulcers Psychiatry: Judgement and insight appear normal. Mood & affect appropriate.    Data Reviewed:  Lab results reviewed.  Family Communication: None  Disposition: Status is: Inpatient Remains inpatient appropriate because: Severity of disease, inpatient procedure.     Time spent: 35 minutes  Author: Murvin Mana, MD 12/01/2024 4:20 PM  For on call review [email or web address].    "

## 2024-12-01 NOTE — Consult Note (Signed)
 "  Cardiology Consultation   Patient ID: Thomas Carlson MRN: [id number]; DOB: Mar 09, 1946  Admit date: 11/30/2024 Date of Consult: 12/01/2024  PCP:  [name] Baller, MD   [facility] Providers Cardiologist:  Lonni Hanson, MD        Patient Profile: Thomas Carlson is a 79 y.o. male with a hx of aortic stenosis and regurgitation, CAD with PCI to the proximal LAD 2005, hypertension, hyperlipidemia, type 2 diabetes, and remote seizure in the setting of subdermal hematoma  who is being seen 12/01/2024 for the evaluation of recurrent syncope at the request of Dr. Laurita.  History of Present Illness:   Mr. [name] was previously followed at [facility] clinic by Dr. Bosie.  He had LHC in 04/2004 with high-grade stenosis of the proximal s/p PCI/DES.  Echo 09/2022 showed EF 50 to 55% with mild LVH, G1 DD, mild MR, and severely calcified aortic valve with mild regurgitation and moderate stenosis.  He established with Dr. Hanson 11/2022 after referral from his PCP with these echo findings.  Echo 03/2024 revealed EF 55 to 60%, G1 DD, mild MR, mild AI, and moderate to severe aortic valve stenosis with VTI 1.69 cm and mean gradient 30 mmHg.   Patient was seen in the GI clinic for routine visit 08/17/2024 where his blood pressure was noted to be 208/74.  He was sent to urgent care for further evaluation where he reported taking his medications as prescribed that morning.  BP in urgent care was 218/82 and he was noted to have lower extremity swelling.  Chest x-ray showed trace bilateral pleural effusions.  He was subsequently transferred to the emergency department for further evaluation.  BP in the ED was 214/93.  Pro BNP 1134.  Troponin minimally elevated and flat trending at 33 greater than 36.  EKG without acute ischemic changes.  He was given hydralazine  20 mg IV and diuresed with 40 mg IV Lasix .  BP improved and he was discharged with new prescriptions for hydralazine  and hydrochlorothiazide .  These were  in addition to PTA losartan  and atenolol .  He again presented to the ED 09/18/2024 after an episode of syncope in the setting of diarrhea. He was found to be hypotensive with BP 84/36 and labs were consistent with dehydration. He was given IV fluids with normalization of BP and later discharged. He followed with with his PCP 11/24 and was again found to be hypotensive with BP 70/40 and referred to the ED for further evaluation where BP had improved to 103/62. He noted that he had not been taking hydralazine  but had been compliant with losartan , atenolol , and hydrochlorothiazide . Labs showed persistent AKI, although improved from prior ED visit. Echo showed EF 55-60% with moderate to severe aortic valve stenosis (VTI 1.15 and mean gradient 28.7). Syncope was felt to be in the setting of diarrhea, overmedication, and aortic stenosis. Hydralazine  and hydrochlorothiazide  were held and he was discharged in stable condition on 09/22/2024. Heart monitor showed predominately sinus rhythm with no evidence of arrhythmia or pause to explain syncope.   He was seen by the structural heart team 11/11/2024 and felt to be a candidate for the PROGRESS CAP research program for treatment of moderate AS with TAVR. Echo 11/19/2024 showed progression of aortic valve stenosis to severe with VTI 0.48 and mean gradient 39.     Patient had an appointment yesterday with the valve research team and was feeling well up until the car ride home. He had been eating and drinking normally. His  family member who was driving noted that he was quiet and looked over to check on him and noticed he had gone unconscious, noting his jaw to be flack with eyes closed. She also reports that he was gasping for air intermittently. She called EMS who found the patient to be awake and oriented with normal vital signs. In the ED, BP 116/50 with HR 56 bpm and otherwise normal vital signs. Labs show dehydration with Cr 1.6 and BUN 26. CBC shows Hgb 11.3, HCT  33.2. Troponin 35 > 32. EKG without acute ischemic changes. He was given IVF and atenolol  was held due to bradycardia. Renal US  revealed a hyperechoic vascular mass in the upper pole of the left kidney. Cardiology was asked to consult for further evaluation of syncope in the setting of severe aortic stenosis. At time of cardiology consult, patient feels back to baseline.   Past Medical History:  Diagnosis Date   Acute posthemorrhagic anemia    Allergy    Aortic valve sclerosis 08/13/2016   Sclerosis without stenosis by US  (08/2016)   CAD (coronary artery disease) 2005   s/p stent ([place])   Cancer (HCC) 3/25   Cholelithiasis    by CT   Chronic kidney disease, stage 3, mod decreased GFR (HCC)    Diabetes mellitus (HCC) 2010   Diverticulosis    by CT   DJD (degenerative joint disease)    knee   Encephalomalacia without cerebral infarction 03/08/2019   Remote anterior frontal and central MCA encephalomalacia after remote trauma noted on MRI 2020 [place])   Genital herpes    Gout    HLD (hyperlipidemia)    HTN (hypertension)    Hypocalcemia 09/03/2019   Knee pain, right    needs replacement   Neuropathy    feet   Obesity    Seizures (HCC)    x1 - after craniotomy (1960s)   Weakness of both legs    since back surgery    Past Surgical History:  Procedure Laterality Date   APPENDECTOMY  1958   BRAIN SURGERY  1965   CLOSED REDUCTION CLAVICLE FRACTURE  1955   COLONOSCOPY  02/2008   int hemorrhoids, diverticula (Dr. Jinny)   COLONOSCOPY N/A 08/26/2018   TA, rpt 3 yrs (Vanga, [place])   COLONOSCOPY WITH PROPOFOL  N/A 11/10/2015   diverticulosis, path with microscopic colitis Clair Jinny, MD)   COLONOSCOPY WITH PROPOFOL  N/A 09/26/2021   diverticulosis, rpt 5 yrs (Vanga, Corinn Skiff, MD)   CORONARY ANGIOPLASTY WITH STENT PLACEMENT  2005   stent 2005   DECOMPRESSIVE LUMBAR LAMINECTOMY LEVEL 1  03/2018   herniated L4/5 disc R with free fargment over L4 s/p surgery L4  ([place])   ESOPHAGOGASTRODUODENOSCOPY N/A 08/26/2018   reactive gastritis with benign biopsies (Vanga, Rohini Reddy)   HERNIA REPAIR  1956   inguinal   JOINT REPLACEMENT  2022   KNEE ARTHROSCOPY  2008   torn meniscus   SPINE SURGERY     SUBDURAL HEMATOMA EVACUATION VIA CRANIOTOMY  1964   hit in helmet by baseball   TOTAL KNEE ARTHROPLASTY Right 11/12/2018   Procedure: TOTAL KNEE ARTHROPLASTY;  Surgeon: Marchia Drivers, MD;  Location: [facility] ORS;  Service: Orthopedics;  Laterality: Right;       Scheduled Meds:  heparin   5,000 Units Subcutaneous Q8H   insulin  aspart  0-5 Units Subcutaneous QHS   insulin  aspart  0-9 Units Subcutaneous TID WC   sodium chloride  flush  3 mL Intravenous Q12H   Continuous Infusions:  lactated ringers  75 mL/hr at 11/30/24 1808   PRN Meds: acetaminophen  **OR** acetaminophen , ondansetron  **OR** ondansetron  (ZOFRAN ) IV, senna-docusate  Allergies:   Allergies[1]  Social History:   Social History   Socioeconomic History   Marital status: Married    Spouse name: Not on file   Number of children: Not on file   Years of education: Not on file   Highest education level: Master's degree (e.g., MA, MS, MEng, MEd, MSW, MBA)  Occupational History   Not on file  Tobacco Use   Smoking status: Never   Smokeless tobacco: Never  Vaping Use   Vaping status: Never Used  Substance and Sexual Activity   Alcohol use: No   Drug use: No   Sexual activity: Not Currently  Other Topics Concern   Not on file  Social History Narrative   Caffeine: 2 cans of diet soda/day   Lives with wife.  1 dog at home.  2 grown children   Occupation: multimedia programmer at [employer]   Edu: Master's degree   Activity: walking some (fit bit)   Diet: good water , fruits/vegetables occasionally, cutting back on carbs   Social Drivers of Health   Tobacco Use: Low Risk (11/30/2024)   Patient History    Smoking Tobacco Use: Never    Smokeless Tobacco Use: Never    Passive  Exposure: Not on file  Financial Resource Strain: Low Risk (08/22/2024)   Overall Financial Resource Strain (CARDIA)    Difficulty of Paying Living Expenses: Not very hard  Food Insecurity: No Food Insecurity (08/22/2024)   Epic    Worried About Radiation Protection Practitioner of Food in the Last Year: Never true    Ran Out of Food in the Last Year: Never true  Transportation Needs: No Transportation Needs (08/22/2024)   Epic    Lack of Transportation (Medical): No    Lack of Transportation (Non-Medical): No  Physical Activity: Unknown (08/22/2024)   Exercise Vital Sign    Days of Exercise per Week: Patient declined    Minutes of Exercise per Session: Not on file  Stress: Stress Concern Present (08/22/2024)   [employer] of Occupational Health - Occupational Stress Questionnaire    Feeling of Stress: To some extent  Social Connections: Moderately Isolated (08/22/2024)   Social Connection and Isolation Panel    Frequency of Communication with Friends and Family: More than three times a week    Frequency of Social Gatherings with Friends and Family: Once a week    Attends Religious Services: Patient declined    Active Member of Clubs or Organizations: No    Attends Engineer, Structural: Not on file    Marital Status: Married  Catering Manager Violence: Not At Risk (08/20/2024)   Epic    Fear of Current or Ex-Partner: No    Emotionally Abused: No    Physically Abused: No    Sexually Abused: No  Depression (PHQ2-9): Low Risk (09/20/2024)   Depression (PHQ2-9)    PHQ-2 Score: 4  Alcohol Screen: Low Risk (09/04/2022)   Alcohol Screen    Last Alcohol Screening Score (AUDIT): 0  Housing: Low Risk (08/22/2024)   Epic    Unable to Pay for Housing in the Last Year: No    Number of Times Moved in the Last Year: 0    Homeless in the Last Year: No  Utilities: Not At Risk (08/20/2024)   Epic    Threatened with loss of utilities: No  Health Literacy: Not on file  Family History:     Family History  Problem Relation Age of Onset   Diabetes Father 76   Coronary artery disease Sister        catheterizations   COPD Brother    Stroke Brother    Hypertension Brother    Cancer Neg Hx      ROS:  Please see the history of present illness.    Physical Exam/Data: Vitals:   12/01/24 0031 12/01/24 0400 12/01/24 0815 12/01/24 0819  BP: 137/68 130/66 134/68   Pulse: (!) 57 68 60   Resp: 15 11 14    Temp: 98.7 F (37.1 C) 98.3 F (36.8 C)  98.3 F (36.8 C)  TempSrc: Oral Oral  Oral  SpO2: 100% 96% 99%   Weight:      Height:        Intake/Output Summary (Last 24 hours) at 12/01/2024 0835 Last data filed at 11/30/2024 1216 Gross per 24 hour  Intake 300 ml  Output --  Net 300 ml      11/30/2024   12:18 PM 11/11/2024    4:09 PM 10/25/2024    3:00 PM  Last 3 Weights  Weight (lbs) 234 lb 231 lb 231 lb 6.4 oz  Weight (kg) 106.142 kg 104.781 kg 104.962 kg     Body mass index is 35.58 kg/m.  General:  Well nourished, well developed, in no acute distress HEENT: normal Neck: no JVD Vascular: No carotid bruits; Distal pulses 2+ bilaterally Cardiac:  normal S1, S2; RRR; III/VI systolic murmur  Lungs:  clear to auscultation bilaterally, no wheezing, rhonchi or rales  Abd: soft, nontender, no hepatomegaly  Ext: no edema Skin: warm and dry  Psych:  Normal affect   EKG:  The EKG was personally reviewed and demonstrates: Sinus bradycardia with nonspecific T wave abnormality, heart rate 54 bpm Telemetry:  Telemetry was personally reviewed and demonstrates:  sinus bradycardia/sinus rhythm with occasional PVCs  Relevant CV Studies:  11/19/2024 2D echo 1. Left ventricular ejection fraction, by estimation, is 55 to 60%. The  left ventricle has normal function. The left ventricle has no regional  wall motion abnormalities. There is mild concentric left ventricular  hypertrophy. Left ventricular diastolic  parameters are consistent with Grade I diastolic dysfunction  (impaired  relaxation).   2. Right ventricular systolic function is normal. The right ventricular  size is normal. There is normal pulmonary artery systolic pressure.   3. Left atrial size was mildly dilated.   4. The mitral valve is normal in structure. Trivial mitral valve  regurgitation. No evidence of mitral stenosis.   5. The aortic valve is calcified. There is severe calcifcation of the  aortic valve. There is severe thickening of the aortic valve. Aortic valve  regurgitation is moderate. Severe aortic valve stenosis. Aortic valve  area, by VTI measures 0.48 cm. Aortic   valve mean gradient measures 39.0 mmHg. Aortic valve Vmax measures 3.99  m/s.   6. The inferior vena cava is normal in size with greater than 50%  respiratory variability, suggesting right atrial pressure of 3 mmHg.   10/2024 Long term monitor Predominantly sinus rhythm with rare PACs and occasional PVCs as well as a single brief episode  of NSVT and a few nonsustained supraventricular runs.   Laboratory Data: High Sensitivity Troponin:  No results for input(s): TROPONINIHS in the last 720 hours.  Recent Labs  Lab 11/30/24 1333 11/30/24 1511  TRNPT 35* 32*      Chemistry Recent Labs  Lab 11/30/24  1030 11/30/24 1333 12/01/24 0406  NA 140 142 141  K 5.1 4.9 4.3  CL 105 109 108  CO2 21 23 21*  GLUCOSE 108* 111* 93  BUN 25 26* 25*  CREATININE 1.48* 1.60* 1.40*  CALCIUM  9.2 8.8* 8.6*  MG  --  2.0  --   GFRNONAA  --  44* 51*  ANIONGAP  --  10 11    Recent Labs  Lab 11/30/24 1030  PROT 6.2  ALBUMIN 4.5  AST 20  ALT 17  ALKPHOS 76  BILITOT 0.3   Lipids No results for input(s): CHOL, TRIG, HDL, LABVLDL, LDLCALC, CHOLHDL in the last 168 hours.  Hematology Recent Labs  Lab 11/30/24 1030 11/30/24 1333 12/01/24 0406  WBC 4.5 5.2 3.8*  RBC 3.75* 3.21* 3.14*  HGB 13.2 11.3* 11.0*  HCT 39.7 33.2* 32.5*  MCV 106* 103.4* 103.5*  MCH 35.2* 35.2* 35.0*  MCHC 33.2 34.0 33.8  RDW  16.0* 16.3* 16.1*  PLT 188 156 142*   Thyroid   Recent Labs  Lab 11/30/24 1511  TSH 2.910    BNP Recent Labs  Lab 11/30/24 1030  PROBNP 5,915*    DDimer No results for input(s): DDIMER in the last 168 hours.  Radiology/Studies:  US  RENAL Result Date: 11/30/2024 IMPRESSION: 1. 2.8 mildly hyperechoic vascular mass in the upper pole of the left kidney. Recommend further characterization with renal protocol CT or MRI with and without IV contrast. Electronically signed by: Norman Gatlin MD 11/30/2024 10:31 PM EST RP Workstation: [id number]   Assessment and Plan:  Recurrent syncope Severe aortic valve stenosis - Prior admission in 08/2024 for an episode of syncope which was felt to be secondary to dehydration and overmedication in the setting of aortic stenosis - Recent echo 11/19/2024 showed preserved LV systolic function with progression of aortic stenosis to the severe range with VTI 0.48 cm and mean gradient 39 mmHg - Now presenting with recurrence of syncope - Labs indicate slight dehydration - Etiology of syncope is unclear - Hold atenolol  in the setting of bradycardia - Continue to monitor on telemetry while admitted. May need repeat Zio monitor on discharge.  - Recommend proceeding with cardiac catheterization to rule out ischemic cause of syncope, tentatively scheduled for tomorrow  AKI - BUN/creatinine up from baseline - Gentle IV hydration  Coronary artery disease - No chest pain - Troponin minimally elevated and flat trending - Resume PTA aspirin  and statin  - R/LHC as above  Hypertension - BP elevated, resume home antihypertensives aside from atenolol   HFpEF - Echo with 10/2024 with EF 55-60% - Appears euvolemic on exam. No acute exacerbation - Recommend slight positive fluid balance, monitor closely    Informed Consent   Shared Decision Making/Informed Consent The risks, including but not limited to, [bleeding or vascular complications (1 in 500),  pneumothorax (1 in 1600), arrhythmia (1 in 1000) and death (1 in 5000)], benefits (diagnostic support and/or management of heart failure, pulmonary hypertension) and alternatives of a right heart catheterization were discussed in detail with Mr. Beaumier and he is willing to proceed. The risks [stroke (1 in 1000), death (1 in 1000), kidney failure [usually temporary] (1 in 500), bleeding (1 in 200), allergic reaction [possibly serious] (1 in 200)], benefits (diagnostic support and management of coronary artery disease) and alternatives of a cardiac catheterization were discussed in detail with Mr. Hanssen and he is willing to proceed.       For questions or updates, please contact [facility] Please consult  [email or web address] for contact info under      Signed, Lesley LITTIE Maffucci, PA-C  12/01/2024 8:35 AM     [1]  Allergies Allergen Reactions   Gabapentin Other (See Comments)    unknown   Bactrim [Sulfamethoxazole-Trimethoprim] Rash    Diffuse drug reaction - maculopapular rash   Penicillins Hives    Has patient had a PCN reaction causing immediate rash, facial/tongue/throat swelling, SOB or lightheadedness with hypotension: Unknown Has patient had a PCN reaction causing severe rash involving mucus membranes or skin necrosis: Unknown Has patient had a PCN reaction that required hospitalization: Unknown Has patient had a PCN reaction occurring within the last 10 years: Unknown If all of the above answers are NO, then may proceed with Cephalosporin use.    Lasix  [Furosemide ] Other (See Comments)    Drops blood pressure and drained potassium and magnesium    Prevnar 20 [Pneumococcal 20-Val Conj Vacc] Rash    Large local reaction at injection site   "

## 2024-12-02 ENCOUNTER — Encounter: Payer: Self-pay | Admitting: Cardiovascular Disease

## 2024-12-02 ENCOUNTER — Encounter: Admission: EM | Disposition: A | Payer: Self-pay | Source: Home / Self Care | Attending: Internal Medicine

## 2024-12-02 ENCOUNTER — Inpatient Hospital Stay

## 2024-12-02 ENCOUNTER — Other Ambulatory Visit: Payer: Self-pay

## 2024-12-02 DIAGNOSIS — N2889 Other specified disorders of kidney and ureter: Secondary | ICD-10-CM | POA: Insufficient documentation

## 2024-12-02 DIAGNOSIS — I350 Nonrheumatic aortic (valve) stenosis: Secondary | ICD-10-CM

## 2024-12-02 LAB — POCT I-STAT 7, (LYTES, BLD GAS, ICA,H+H)
Acid-base deficit: 4 mmol/L — ABNORMAL HIGH (ref 0.0–2.0)
Bicarbonate: 20.7 mmol/L (ref 20.0–28.0)
Calcium, Ion: 1.23 mmol/L (ref 1.15–1.40)
HCT: 33 % — ABNORMAL LOW (ref 39.0–52.0)
Hemoglobin: 11.2 g/dL — ABNORMAL LOW (ref 13.0–17.0)
O2 Saturation: 95 %
Potassium: 3.9 mmol/L (ref 3.5–5.1)
Sodium: 140 mmol/L (ref 135–145)
TCO2: 22 mmol/L (ref 22–32)
pCO2 arterial: 35.2 mmHg (ref 32–48)
pH, Arterial: 7.378 (ref 7.35–7.45)
pO2, Arterial: 74 mmHg — ABNORMAL LOW (ref 83–108)

## 2024-12-02 LAB — POCT I-STAT EG7
Acid-base deficit: 3 mmol/L — ABNORMAL HIGH (ref 0.0–2.0)
Bicarbonate: 22.1 mmol/L (ref 20.0–28.0)
Calcium, Ion: 1.25 mmol/L (ref 1.15–1.40)
HCT: 33 % — ABNORMAL LOW (ref 39.0–52.0)
Hemoglobin: 11.2 g/dL — ABNORMAL LOW (ref 13.0–17.0)
O2 Saturation: 70 %
Potassium: 3.8 mmol/L (ref 3.5–5.1)
Sodium: 140 mmol/L (ref 135–145)
TCO2: 23 mmol/L (ref 22–32)
pCO2, Ven: 39 mmHg — ABNORMAL LOW (ref 44–60)
pH, Ven: 7.361 (ref 7.25–7.43)
pO2, Ven: 38 mmHg (ref 32–45)

## 2024-12-02 LAB — GLUCOSE, CAPILLARY
Glucose-Capillary: 89 mg/dL (ref 70–99)
Glucose-Capillary: 89 mg/dL (ref 70–99)
Glucose-Capillary: 89 mg/dL (ref 70–99)
Glucose-Capillary: 109 mg/dL — ABNORMAL HIGH (ref 70–99)

## 2024-12-02 MED ORDER — SODIUM CHLORIDE 0.9% FLUSH
3.0000 mL | Freq: Two times a day (BID) | INTRAVENOUS | Status: DC
  Administered 2024-12-02: 3 mL via INTRAVENOUS

## 2024-12-02 MED ORDER — HEPARIN (PORCINE) IN NACL 1000-0.9 UT/500ML-% IV SOLN
INTRAVENOUS | Status: AC
  Filled 2024-12-02: qty 1000

## 2024-12-02 MED ORDER — LIDOCAINE HCL (PF) 1 % IJ SOLN
INTRAMUSCULAR | Status: DC | PRN
  Administered 2024-12-02 (×3): 5 mL via SUBCUTANEOUS

## 2024-12-02 MED ORDER — HEPARIN SODIUM (PORCINE) 1000 UNIT/ML IJ SOLN
INTRAMUSCULAR | Status: AC
  Filled 2024-12-02: qty 10

## 2024-12-02 MED ORDER — SODIUM CHLORIDE 0.9 % IV SOLN: 250.0000 mL | INTRAVENOUS | Status: AC | PRN

## 2024-12-02 MED ORDER — FENTANYL CITRATE (PF) 100 MCG/2ML IJ SOLN
INTRAMUSCULAR | Status: DC | PRN
  Administered 2024-12-02: 25 ug via INTRAVENOUS

## 2024-12-02 MED ORDER — SODIUM CHLORIDE 0.9% FLUSH: 3.0000 mL | INTRAVENOUS | Status: DC | PRN

## 2024-12-02 MED ORDER — ASPIRIN 81 MG PO CHEW
81.0000 mg | CHEWABLE_TABLET | ORAL | Status: AC
  Administered 2024-12-02: 81 mg via ORAL

## 2024-12-02 MED ORDER — VERAPAMIL HCL 2.5 MG/ML IV SOLN
INTRAVENOUS | Status: AC
  Filled 2024-12-02: qty 2

## 2024-12-02 MED ORDER — MIDAZOLAM HCL (PF) 2 MG/2ML IJ SOLN
INTRAMUSCULAR | Status: DC | PRN
  Administered 2024-12-02: 1 mg via INTRAVENOUS

## 2024-12-02 MED ORDER — LOSARTAN POTASSIUM 50 MG PO TABS
50.0000 mg | ORAL_TABLET | Freq: Every day | ORAL | Status: DC
  Filled 2024-12-02: qty 1

## 2024-12-02 MED ORDER — SODIUM CHLORIDE 0.9 % IV SOLN: INTRAVENOUS | Status: DC

## 2024-12-02 MED ORDER — VERAPAMIL HCL 2.5 MG/ML IV SOLN
INTRAVENOUS | Status: DC | PRN
  Administered 2024-12-02 (×2): 2.5 mg via INTRA_ARTERIAL

## 2024-12-02 MED ORDER — HEPARIN SODIUM (PORCINE) 1000 UNIT/ML IJ SOLN
INTRAMUSCULAR | Status: DC | PRN
  Administered 2024-12-02: 5000 [IU] via INTRAVENOUS

## 2024-12-02 MED ORDER — MIDAZOLAM HCL 2 MG/2ML IJ SOLN
INTRAMUSCULAR | Status: AC
  Filled 2024-12-02: qty 2

## 2024-12-02 MED ORDER — LIDOCAINE HCL 1 % IJ SOLN
INTRAMUSCULAR | Status: AC
  Filled 2024-12-02: qty 20

## 2024-12-02 MED ORDER — IOHEXOL 300 MG/ML  SOLN
INTRAMUSCULAR | Status: DC | PRN
  Administered 2024-12-02: 52 mL

## 2024-12-02 MED ORDER — FREE WATER: 500.0000 mL | Freq: Once | Status: DC

## 2024-12-02 MED ORDER — ASPIRIN 81 MG PO CHEW
CHEWABLE_TABLET | ORAL | Status: AC
  Filled 2024-12-02: qty 1

## 2024-12-02 MED ORDER — FENTANYL CITRATE (PF) 100 MCG/2ML IJ SOLN
INTRAMUSCULAR | Status: AC
  Filled 2024-12-02: qty 2

## 2024-12-02 MED ORDER — HEPARIN (PORCINE) IN NACL 2000-0.9 UNIT/L-% IV SOLN
INTRAVENOUS | Status: DC | PRN
  Administered 2024-12-02: 1000 mL

## 2024-12-02 MED ORDER — GADOBUTROL 1 MMOL/ML IV SOLN
10.0000 mL | Freq: Once | INTRAVENOUS | Status: AC | PRN
  Administered 2024-12-02: 10 mL via INTRAVENOUS

## 2024-12-02 NOTE — Progress Notes (Signed)
" °  Progress Note   Patient: Thomas Carlson FMW:[id number] DOB: [birth year] DOA: 11/30/2024     1 DOS: the patient was seen and examined on 12/02/2024   Brief hospital course: Thomas Carlson is a 79 y.o. year old male with medical history of hypertension, hyperlipidemia, type 2 diabetes, CHF (EF 50%, G1DD), moderate-severe aortic stenosis (aortic valve gradient at 39 mmHg and V-max at 3.99 m/s) presenting to the ED after a syncopal episode.  Patient is seen by cardiology, scheduled for heart cath tomorrow.   Principal Problem:   Syncope Active Problems:   Nonrheumatic aortic valve stenosis   CAD with history of LAD stent(2005)   Chronic heart failure with preserved ejection fraction (HFpEF, >= 50%) (HCC)   Hyperlipidemia associated with type 2 diabetes mellitus (HCC)   Type II diabetes mellitus with renal manifestations (HCC)   Gout   CKD stage 3a, GFR 45-59 ml/min (HCC)   Vitamin D  deficiency   Syncope and collapse   Thrombocytopenia   Left renal mass   Assessment and Plan: Syncope and collapse secondary to severe aortic stenosis. Severe aortic stenosis with moderate aortic regurgitation. Coronary artery disease with 100% stenosis in RCA. Seen by cardiology, syncope was due to severe aortic stenosis.  This is second time patient had syncope in the past 2 months.  Patient has been evaluated for TEVR, heart catheter performed, showed 100% in the RCA stenosis. Patient also ordered carotid ultrasound to rule out chronic stenosis.  Renal mass. Incidental finding on renal ultrasound, recommended MRI scan.  Will perform a scan before discharge.   Chronic diastolic congestive heart failure. Patient has elevation in proBNP, but clinically patient is euvolemic.   Chronic kidney disease stage IIIa. Renal function stable.   Class II obesity with BMI 35.58 Diet and exercise.   Type 2 diabetes. Glucose not elevated.   Mild thrombocytopenia. Continue to follow       Subjective:   Patient doing well today, no short of breath or chest pain.  Physical Exam: Vitals:   12/02/24 1215 12/02/24 1230 12/02/24 1232 12/02/24 1301  BP: (!) 159/40 (!) 117/101 (!) 130/52 (!) 106/93  Pulse: 82 72 75 82  Resp: (!) 25 18 19 18   Temp:   97.9 F (36.6 C) 98.2 F (36.8 C)  TempSrc:   Temporal   SpO2: 93% 97% 97% 96%  Weight:      Height:       General exam: Appears calm and comfortable  Respiratory system: Clear to auscultation. Respiratory effort normal. Cardiovascular system: S1 & S2 heard, RRR.  2/6 systolic murmur in right upper sternal border.. No pedal edema. Gastrointestinal system: Abdomen is nondistended, soft and nontender. No organomegaly or masses felt. Normal bowel sounds heard. Central nervous system: Alert and oriented. No focal neurological deficits. Extremities: Symmetric 5 x 5 power. Skin: No rashes, lesions or ulcers Psychiatry: Judgement and insight appear normal. Mood & affect appropriate.    Data Reviewed:  Lab results reviewed.  Family Communication: Wife updated at bedside.  Disposition: Status is: Inpatient Remains inpatient appropriate because: Severity disease, inpatient procedure     Time spent: 35 minutes  Author: Murvin Mana, MD 12/02/2024 3:25 PM  For on call review [email or web address].    "

## 2024-12-02 NOTE — Progress Notes (Signed)
 "  Rounding Note   Patient Name: Thomas Carlson Date of Encounter: 12/02/2024  [facility] Cardiologist: Lonni Hanson, MD   Subjective  Presenting yesterday after episode of syncope No events overnight, Telemetry without significant arrhythmia, atenolol  held yesterday for bradycardia -Cardiac catheterization this morning with chronic proximately occluded codominant RCA, patent LAD stent with mild in-stent restenosis -Unable to cross valve for gradient - Mildly elevated right atrial pressure, mild pulmonary hypertension -No complications  both right radial and left radial artery accessed for catheterization   Scheduled Meds:  aspirin        aspirin  EC  81 mg Oral Daily   free water   500 mL Oral Once   heparin   5,000 Units Subcutaneous Q8H   insulin  aspart  0-5 Units Subcutaneous QHS   insulin  aspart  0-9 Units Subcutaneous TID WC   [START ON 12/03/2024] losartan   50 mg Oral Daily   rosuvastatin   20 mg Oral QHS   sodium chloride  flush  3 mL Intravenous Q12H   sodium chloride  flush  3 mL Intravenous Q12H   Continuous Infusions:  sodium chloride      PRN Meds: sodium chloride , acetaminophen  **OR** acetaminophen , aspirin , hydrALAZINE , ondansetron  **OR** ondansetron  (ZOFRAN ) IV, senna-docusate, sodium chloride  flush   Vital Signs  Vitals:   12/02/24 1215 12/02/24 1230 12/02/24 1232 12/02/24 1301  BP: (!) 159/40 (!) 117/101 (!) 130/52 (!) 106/93  Pulse: 82 72 75 82  Resp: (!) 25 18 19 18   Temp:   97.9 F (36.6 C) 98.2 F (36.8 C)  TempSrc:   Temporal   SpO2: 93% 97% 97% 96%  Weight:      Height:       No intake or output data in the 24 hours ending 12/02/24 1514    11/30/2024   12:18 PM 11/11/2024    4:09 PM 10/25/2024    3:00 PM  Last 3 Weights  Weight (lbs) 234 lb 231 lb 231 lb 6.4 oz  Weight (kg) 106.142 kg 104.781 kg 104.962 kg      Telemetry Normal sinus rhythm- Personally Reviewed  ECG   - Personally Reviewed  Physical Exam  GEN: No acute  distress.   Neck: No JVD Cardiac: RRR, 3/6 systolic ejection murmur right sternal border No  rubs, or gallops.  Respiratory: Clear to auscultation bilaterally. GI: Soft, nontender, non-distended  MS: No edema; No deformity. Neuro:  Nonfocal  Psych: Normal affect   Labs High Sensitivity Troponin:  No results for input(s): TROPONINIHS in the last 720 hours.  Recent Labs  Lab 11/30/24 1333 11/30/24 1511  TRNPT 35* 32*       Chemistry Recent Labs  Lab 11/30/24 1030 11/30/24 1030 11/30/24 1333 12/01/24 0406 12/02/24 0820 12/02/24 0825  NA 140   < > 142 141 140 140  K 5.1  --  4.9 4.3 3.9 3.8  CL 105  --  109 108  --   --   CO2 21  --  23 21*  --   --   GLUCOSE 108*  --  111* 93  --   --   BUN 25  --  26* 25*  --   --   CREATININE 1.48*  --  1.60* 1.40*  --   --   CALCIUM  9.2  --  8.8* 8.6*  --   --   MG  --   --  2.0  --   --   --   PROT 6.2  --   --   --   --   --  ALBUMIN 4.5  --   --   --   --   --   AST 20  --   --   --   --   --   ALT 17  --   --   --   --   --   ALKPHOS 76  --   --   --   --   --   BILITOT 0.3  --   --   --   --   --   GFRNONAA  --   --  44* 51*  --   --   ANIONGAP  --   --  10 11  --   --    < > = values in this interval not displayed.    Lipids No results for input(s): CHOL, TRIG, HDL, LABVLDL, LDLCALC, CHOLHDL in the last 168 hours.  Hematology Recent Labs  Lab 11/30/24 1030 11/30/24 1333 11/30/24 1333 12/01/24 0406 12/02/24 0820 12/02/24 0825  WBC 4.5 5.2  --  3.8*  --   --   RBC 3.75* 3.21*  --  3.14*  --   --   HGB 13.2 11.3*   < > 11.0* 11.2* 11.2*  HCT 39.7 33.2*   < > 32.5* 33.0* 33.0*  MCV 106* 103.4*  --  103.5*  --   --   MCH 35.2* 35.2*  --  35.0*  --   --   MCHC 33.2 34.0  --  33.8  --   --   RDW 16.0* 16.3*  --  16.1*  --   --   PLT 188 156  --  142*  --   --    < > = values in this interval not displayed.   Thyroid   Recent Labs  Lab 11/30/24 1511  TSH 2.910    BNP Recent Labs  Lab  11/30/24 1030  PROBNP 5,915*    DDimer No results for input(s): DDIMER in the last 168 hours.   Radiology  CARDIAC CATHETERIZATION Result Date: 12/02/2024   Prox RCA lesion is 100% stenosed.   Prox LAD to Mid LAD lesion is 30% stenosed. 1.  Significant one-vessel coronary artery disease with chronically occluded proximal codominant right coronary artery.  Patent LAD stent with mild in-stent restenosis.  Moderately calcified coronary arteries overall. 2.  Left ventricular angiography was not performed.  I did not attempt to cross the aortic valve. 3.  Right heart catheterization showed mildly elevated right atrial pressure, normal wedge pressure, mild pulmonary hypertension and normal cardiac output. Recommendations: Recommend medical therapy for coronary artery disease. Proceed with urgent evaluation of severe to critical aortic stenosis. Avoid catheterization via the right radial artery in the future due to severe innominate artery tortuosity.  The procedure was done via the left radial artery.   US  RENAL Result Date: 11/30/2024 EXAM: RETROPERITONEAL ULTRASOUND OF THE KIDNEYS 11/30/2024 10:19:29 PM TECHNIQUE: Real-time ultrasonography of the retroperitoneum, specifically the kidneys and urinary bladder, was performed. COMPARISON: Ultrasound dated 09/09/2018. CLINICAL HISTORY: Acute kidney injury. ICD [id number] Acute kidney injury. FINDINGS: RIGHT KIDNEY: Right kidney measures 9.1 cm in length. Right renal volume 150 ml. Benign cysts in the right kidney measuring up to 7.4 cm. No follow up recommended. Normal cortical echogenicity. No hydronephrosis. No calculus. No mass. LEFT KIDNEY: Left kidney measures 10.9 cm in length. Left renal volume 191 ml. 2.8 x 2.6 x 2.5 cm mildly hyperechoic mass with internal vascularity in the upper pole of the left kidney. Normal cortical  echogenicity. No hydronephrosis. No calculus. IMPRESSION: 1. 2.8 mildly hyperechoic vascular mass in the upper pole of the left kidney.  Recommend further characterization with renal protocol CT or MRI with and without IV contrast. Electronically signed by: Norman Gatlin MD 11/30/2024 10:31 PM EST RP Workstation: [id number]    Cardiac Studies   Patient Profile   Thomas Carlson is a 79 y.o. male with a hx of severe aortic stenosis, CAD with prior PCI to the proximal LAD 2005, hypertension, hyperlipidemia, type 2 diabetes, and history of seizure in the setting of subdermal hematoma presenting after episode of loss of consciousness   Assessment & Plan  Syncope Episode of syncope November 2025 in the setting of dehydration, ulcerative colitis flare with diarrhea and aortic valve stenosis - Recurrent episode of syncope this admission while driving home from [place],  Denies recent UC flare, no diarrhea or GI pain -Unable to seclude symptomatic bradycardia or other arrhythmia, ischemia, exacerbation from aortic valve stenosis -History of coronary disease with prior stenting to the LAD -Cardiac catheterization this morning with patent stent, occluded proximal RCA which appears chronic - Recommend holding beta-blocker given baseline bradycardia - Recent echocardiogram with severe aortic valve stenosis with mean gradient 39 mmHg, VTI 0.48 cm - Zio monitor at discharge with hold of beta-blocker -Carotid ultrasound ordered   Chronic renal failure Creatinine around his baseline - Encouraged him to stay hydrated   Coronary artery disease Prior history of coronary disease and stent to the LAD 2005 - Cardiac catheterization with occluded proximal RCA, patent stent LAD   Essential hypertension Will hold atenolol  given the bradycardia and syncope - Continue losartan  50 daily  Plan for discharge this afternoon after carotid ultrasound -Case discussed with [facility] valvular [facility] - Order placed for CT surgical evaluation/TAVR - Chest aorta CT scan    For questions or updates, please contact [facility] Please consult [email or web address] for contact info under      Signed, Meriam Chojnowski, MD  12/02/2024, 3:14 PM    "

## 2024-12-02 NOTE — Plan of Care (Signed)
  Problem: Education: Goal: Individualized Educational Video(s) Outcome: Progressing   Problem: Fluid Volume: Goal: Ability to maintain a balanced intake and output will improve Outcome: Progressing

## 2024-12-02 NOTE — Discharge Instructions (Signed)
 [facility] at [employer] [facility] [employer]. [facility] [address] [place], [place]  [address] Phone:  [phone]   Fax:  [phone] December 02, 2024     Thomas Carlson [address] [place] [address]     Dear Mr. Cloward,   You are scheduled for CT scans on 12/06/2024. This test will be performed at the [facility], [address], [place], [place] [address]. You may use valet parking or the parking garage. Please register on the first floor at 12:15 PM, then take the elevator to the second floor for check-in and testing.                 Your CT scans (of chest/abdomen/pelvis/heart) will begin at 1:00 PM. After CT scans are complete you are okay to eat.  Pre procedure instructions for CT scan. Please follow these instructions carefully (unless otherwise directed):   Hold all erectile dysfunction medications at least 72 hours prior to test.     On the Day of the Test:    Drink plenty of water . Do not drink any water  or eat any food within one hour of the test. Nothing after 12:00 PM.    You may take your regular medications prior to the test.    If you wear a continuous glucose monitor, the devise MUST be removed prior to scanning.   After the Test:   Drink plenty of water .    After receiving IV contrast, you may experience a mild flushed feeling. This is normal.    On occasion, you may experience a mild rash up to 24 hours after the test. This is not dangerous. If this occurs, you can take Benadryl  25 mg and increase your fluid intake.    If you experience trouble breathing, this can be serious. If it is severe call 911 IMMEDIATELY. If it is mild, please call our office.    If you take any of these medications: Glipizide/Metformin , Avandamet, Glucovance, please do not take 48 hours after completing test.       Pre-Admission Testing    You are scheduled for Pre Admission Testing on Friday, December 10, 2024 at 8:00 AM.  Please arrive at 7:45 AM in Admitting at [facility] (Main Entrance A) for check-in. No restrictions for this appointment, you are okay to eat. At this appointment blood will be drawn and you will be asked to give a urine specimen. An EKG and chest x-ray will be performed, if needed.      Surgeon Follow-Up:     Directly after your pre-Admission Tesing appointmnet at the hospital, you will have an appointment with Dr. Linnie Rayas at [facility] (across the street) for further cardiac surgery evaluation.   [address] [place], [place] [address], [phone] (Check in on the 4th floor, Zone C)     You are scheduled for surgical evaluation on 12/10/24 at 9:10AM.  - Please call the office on the day of your appointment to make sure the surgeon will be there and on time as many times there are delays or rescheduling due to emergency surgery.   If you have any questions or concerns, please do not hesitate to call.   Sincerely,   Structural Heart Team [phone]   Do you feel isolated?  [facility] offers a [employer] that anyone can call toll free at [phone]. The friendship line is available 24 hours a  day  [facility] is a Program of All-inclusive Care for the Elderly (PACE). Their mission is to promote and sustain the independence of seniors wishing to remain in the community. They provide seniors with comprehensive long-term health, social, medical and dietary care. Their program is a safe alternative to nursing home care. [phone]  [facility] Physical Address [facility] [address] [place], [place] [address] Phone: [phone]. . Online zoom yoga class, connect with others without leaving your home [facility] offers [place] dance cardio sessions for individuals via Zoom. This program provides: - Dance fitness activities Please contact program for more information. Servinganyone in need adults 18+  hiv/aids individuals families Call [phone]  Email [email or web address] [email or web address] to get more info  [employer] offers an online [employer] to individuals where they can receive help to focus on their best health. Whether you're a [employer] member or not, the neighborhood center offers a... Main Serviceshealth education  exercise & fitness  community support services  recreation  virtual support Other Servicessupport groups Servinganyone in need adults young adults teens seniors individuals families [email or web address] to get more info  Schedule on their website  The [facility] offers an array of activities for adults age 28 and over. This program provides:- Fitness and health programs- Tech classes- Activity books Main Serviceshealth education  community support services  exercise & fitness  recreation  more education Servingseniors  Call [phone]    For more resources go online to [email or web address] and type in you zipcode

## 2024-12-02 NOTE — Interval H&P Note (Signed)
 History and Physical Interval Note:  12/02/2024 8:09 AM  Thomas Carlson  has presented today for surgery, with the diagnosis of syncope, CAD, severe aortic valve stenosis.  The various methods of treatment have been discussed with the patient and family. After consideration of risks, benefits and other options for treatment, the patient has consented to  Procedures: RIGHT/LEFT HEART CATH AND CORONARY ANGIOGRAPHY (N/A) as a surgical intervention.  The patient's history has been reviewed, patient examined, no change in status, stable for surgery.  I have reviewed the patient's chart and labs.  Questions were answered to the patient's satisfaction.     Kengo Sturges

## 2024-12-03 ENCOUNTER — Other Ambulatory Visit (HOSPITAL_COMMUNITY)

## 2024-12-03 LAB — BASIC METABOLIC PANEL WITH GFR
Anion gap: 12 (ref 5–15)
BUN: 20 mg/dL (ref 8–23)
CO2: 21 mmol/L — ABNORMAL LOW (ref 22–32)
Calcium: 8.7 mg/dL — ABNORMAL LOW (ref 8.9–10.3)
Chloride: 107 mmol/L (ref 98–111)
Creatinine, Ser: 1.2 mg/dL (ref 0.61–1.24)
GFR, Estimated: >60 mL/min
Glucose, Bld: 94 mg/dL (ref 70–99)
Potassium: 4 mmol/L (ref 3.5–5.1)
Sodium: 139 mmol/L (ref 135–145)

## 2024-12-03 LAB — GLUCOSE, CAPILLARY: Glucose-Capillary: 96 mg/dL (ref 70–99)

## 2024-12-03 LAB — CBC
HCT: 34.5 % — ABNORMAL LOW (ref 39.0–52.0)
Hemoglobin: 11.5 g/dL — ABNORMAL LOW (ref 13.0–17.0)
MCH: 35.1 pg — ABNORMAL HIGH (ref 26.0–34.0)
MCHC: 33.3 g/dL (ref 30.0–36.0)
MCV: 105.2 fL — ABNORMAL HIGH (ref 80.0–100.0)
Platelets: 139 10*3/uL — ABNORMAL LOW (ref 150–400)
RBC: 3.28 MIL/uL — ABNORMAL LOW (ref 4.22–5.81)
RDW: 15.8 % — ABNORMAL HIGH (ref 11.5–15.5)
WBC: 3.7 10*3/uL — ABNORMAL LOW (ref 4.0–10.5)
nRBC: 0 % (ref 0.0–0.2)

## 2024-12-03 LAB — MAGNESIUM: Magnesium: 1.9 mg/dL (ref 1.7–2.4)

## 2024-12-03 NOTE — Discharge Summary (Signed)
 " Physician Discharge Summary   Patient: Thomas Carlson MRN: [id number] DOB: 29-Apr-1946  Admit date:     11/30/2024  Discharge date: 12/03/24  Discharge Physician: Murvin Mana   PCP: [name] Baller, MD   Recommendations at discharge:   Follow-up with Dr. Penne for urology in 2 weeks. Go as scheduled for TEVAR which you has been arranged by cardiology. Follow-up with PCP in 1 week.  Discharge Diagnoses: Principal Problem:   Syncope Active Problems:   Nonrheumatic aortic valve stenosis   CAD with history of LAD stent(2005)   Chronic heart failure with preserved ejection fraction (HFpEF, >= 50%) (HCC)   Hyperlipidemia associated with type 2 diabetes mellitus (HCC)   Type II diabetes mellitus with renal manifestations (HCC)   Gout   CKD stage 3a, GFR 45-59 ml/min (HCC)   Vitamin D  deficiency   Syncope and collapse   Thrombocytopenia   Left renal mass  Resolved Problems:   * No resolved hospital problems. *  Hospital Course: Thomas Carlson is a 79 y.o. year old male with medical history of hypertension, hyperlipidemia, type 2 diabetes, CHF (EF 50%, G1DD), moderate-severe aortic stenosis (aortic valve gradient at 39 mmHg and V-max at 3.99 m/s) presenting to the ED after a syncopal episode.  Patient is seen by cardiology, heart cath showed 100% stenosis in RCA, patient also was found to have severe aortic stenosis.  Patient has set up for TEVAR in the near future with [place]. Incidental finding of renal mass on ultrasound was confirmed to be likely renal cell carcinoma on MRI.  Discussed with Dr. Penne, he will see the patient in office in follow-ups.  Assessment and Plan:  Syncope and collapse secondary to severe aortic stenosis. Severe aortic stenosis with moderate aortic regurgitation. Coronary artery disease with 100% stenosis in RCA. Seen by cardiology, syncope was due to severe aortic stenosis.  This is second time patient had syncope in the past 2 months.   Patient has been evaluated for TEVR, heart catheter performed, showed 100% in the RCA stenosis. Carotid ultrasound did not show any significant stenosis. Patient has been scheduled by cardiology to go through the TEVAR process.   Renal mass.  Likely renal cell carcinoma. Incidental finding on renal ultrasound, MRI with contrast showed a likely renal cell carcinoma.  Discussed with Dr. Penne from urology, he prefer to see patient in the office.  Patient is advised to call the office to be seen in about 2 or 3 weeks.   Chronic diastolic congestive heart failure. Patient has elevation in proBNP, but clinically patient is euvolemic.   Chronic kidney disease stage IIIa. Minimal metabolic acidosis Renal function stable.   Class II obesity with BMI 35.58 Diet and exercise.   Type 2 diabetes. Glucose not elevated.   Mild thrombocytopenia. Continue to follow         Consultants: Cardiology. Procedures performed: Heart cath. Disposition: Home Diet recommendation:  Discharge Diet Orders (From admission, onward)     Start     Ordered   12/03/24 0000  Diet - low sodium heart healthy        12/03/24 0935           Cardiac diet DISCHARGE MEDICATION: Allergies as of 12/03/2024       Reactions   Gabapentin Other (See Comments)   unknown   Bactrim [sulfamethoxazole-trimethoprim] Rash   Diffuse drug reaction - maculopapular rash   Penicillins Hives   Has patient had a PCN reaction causing immediate rash,  facial/tongue/throat swelling, SOB or lightheadedness with hypotension: Unknown Has patient had a PCN reaction causing severe rash involving mucus membranes or skin necrosis: Unknown Has patient had a PCN reaction that required hospitalization: Unknown Has patient had a PCN reaction occurring within the last 10 years: Unknown If all of the above answers are NO, then may proceed with Cephalosporin use.   Lasix  [furosemide ] Other (See Comments)   Drops blood pressure and  drained potassium and magnesium    Prevnar 20 [pneumococcal 20-val Conj Vacc] Rash   Large local reaction at injection site        Medication List     STOP taking these medications    Lantus  SoloStar 100 UNIT/ML Solostar Pen Generic drug: insulin  glargine   metFORMIN  500 MG 24 hr tablet Commonly known as: GLUCOPHAGE -XR       TAKE these medications    Accu-Chek Guide Me w/Device Kit Use as instructed to check blood sugar once a day   Accu-Chek Guide Test test strip Generic drug: glucose blood Use as instructed to check blood sugar once a day   Accu-Chek Softclix Lancets lancets USE TO CHECK BLOOD GLUCOSE ONE TIME DAILY   acetaminophen  500 MG tablet Commonly known as: TYLENOL  Take 1,000 mg by mouth every 6 (six) hours as needed for headache.   acyclovir  400 MG tablet Commonly known as: ZOVIRAX  TAKE 1 TABLET EVERY DAY   allopurinol  100 MG tablet Commonly known as: ZYLOPRIM  Take 1 tablet (100 mg total) by mouth daily.   aspirin  EC 81 MG tablet Take 1 tablet (81 mg total) by mouth daily.   atenolol  25 MG tablet Commonly known as: TENORMIN  Take 1 tablet (25 mg total) by mouth daily.   B-12 1000 MCG Subl Place 1 tablet under the tongue once a week.   Biotin  1000 MCG tablet Take 1,000 mcg by mouth daily.   losartan  100 MG tablet Commonly known as: COZAAR  Take 0.5 tablets (50 mg total) by mouth daily.   Magnesium  500 MG Tabs Take 500 mg by mouth daily.   Pen Needles 31G X 5 MM Misc Use as directed to inject insulin  daily   potassium chloride  10 MEQ tablet Commonly known as: KLOR-CON  M Take 1 tablet (10 mEq total) by mouth daily.   rosuvastatin  20 MG tablet Commonly known as: CRESTOR  Take 20 mg by mouth at bedtime.   tamsulosin  0.4 MG Caps capsule Commonly known as: FLOMAX  Take 1 capsule (0.4 mg total) by mouth daily.   Vitamin D3 25 MCG (1000 UT) Caps Take 1 capsule (1,000 Units total) by mouth daily.        Follow-up Information      Georganne Penne SAUNDERS, MD Follow up in 2 week(s).   Specialty: Urology Contact information: [address] [place], [address] [place] [address] [phone]         [name] Baller, MD Follow up in 1 week(s).   Specialty: Family Medicine Contact information: [address] [place] [address] [phone]                Discharge Exam: Fredricka Weights   11/30/24 1218 12/03/24 0500  Weight: 106.1 kg 101 kg   General exam: Appears calm and comfortable  Respiratory system: Clear to auscultation. Respiratory effort normal. Cardiovascular system: S1 & S2 heard, RRR. No JVD, murmurs, rubs, gallops or clicks. No pedal edema. Gastrointestinal system: Abdomen is nondistended, soft and nontender. No organomegaly or masses felt. Normal bowel sounds heard. Central nervous system: Alert and  oriented. No focal neurological deficits. Extremities: Symmetric 5 x 5 power. Skin: No rashes, lesions or ulcers Psychiatry: Judgement and insight appear normal. Mood & affect appropriate.    Condition at discharge: good  The results of significant diagnostics from this hospitalization (including imaging, microbiology, ancillary and laboratory) are listed below for reference.   Imaging Studies: MR ABDOMEN W WO CONTRAST Result Date: 12/03/2024 CLINICAL DATA:  History provided by technologist open Loss of consciousness. Syncope. EXAM: MRI ABDOMEN WITHOUT AND WITH CONTRAST TECHNIQUE: Multiplanar multisequence MR imaging of the abdomen was performed both before and after the administration of intravenous contrast. CONTRAST:  10mL GADAVIST  GADOBUTROL  1 MMOL/ML IV SOLN COMPARISON:  Renal ultrasound from 11/30/2024. FINDINGS: Lower chest: Unremarkable MR appearance to the lung bases. No pleural effusion. No pericardial effusion. Normal heart size. Hepatobiliary: The liver is normal in size. Noncirrhotic configuration. No suspicious liver lesion. No intrahepatic or extrahepatic bile duct  dilatation. No choledocholithiasis. Unremarkable gallbladder. Pancreas: No mass, inflammatory changes or other parenchymal abnormality identified. No main pancreatic duct dilation. Spleen:  Within normal limits in size and appearance. No focal mass. Adrenals/Urinary Tract: Unremarkable adrenal glands. No hydroureteronephrosis on either side. There are multiple simple renal cysts throughout bilateral kidneys with largest arising from the right kidney upper pole measuring up to 6.5 x 7.9 cm. There is an ill-defined, T1 isointense and T2 hypointense moderately enhancing heterogeneous mass in the left kidney upper pole, posterolaterally measuring 1.9 x 3.0 cm (series 21, image 52), which corresponds to the lesion described on the recent ultrasound. No micro or macroscopic fat within. There is diffusion restriction on high B value images and the lesion appears dark on ADC mapping. Findings favor renal cell carcinoma, until proven otherwise. The lesion abuts the sinus fat. Left renal vein is patent. No perirenal lymphadenopathy. Stomach/Bowel: There is a tiny sliding hiatal hernia. No disproportionate dilation of small or large bowel loops. There are multiple scattered colonic diverticula without diverticulitis. Vascular/Lymphatic: No pathologically enlarged lymph nodes identified. No abdominal aortic aneurysm demonstrated. No ascites. Other:  None. Musculoskeletal: No suspicious bone lesions identified. IMPRESSION: 1. There is a 1.9 x 3.0 cm heterogeneous, moderately enhancing lesion in the left kidney upper pole, posterolaterally, which corresponds to the lesion described on the recent ultrasound. Findings favor renal cell carcinoma, until proven otherwise. No metastatic disease identified within the abdomen. 2. Multiple other nonacute observations (such as multiple bilateral simple renal cysts, tiny sliding hiatal hernia, scattered colonic diverticulosis, etc.), As described above. Electronically Signed   By: Ree Molt M.D.   On: 12/03/2024 08:34   US  Carotid Bilateral Result Date: 12/03/2024 CLINICAL DATA:  Stroke.  Hypertension.  Syncope.  Diabetes. EXAM: BILATERAL CAROTID DUPLEX ULTRASOUND TECHNIQUE: Elnor scale imaging, color Doppler and duplex ultrasound were performed of bilateral carotid and vertebral arteries in the neck. COMPARISON:  None available FINDINGS: Criteria: Quantification of carotid stenosis is based on velocity parameters that correlate the residual internal carotid diameter with NASCET-based stenosis levels, using the diameter of the distal internal carotid lumen as the denominator for stenosis measurement. The following velocity measurements were obtained: RIGHT ICA: 101/20 cm/sec CCA: 92/10 cm/sec SYSTOLIC ICA/CCA RATIO:  1.1 ECA: 107 cm/sec LEFT ICA: 73/17 cm/sec CCA: 73/8 cm/sec SYSTOLIC ICA/CCA RATIO:  1.0 ECA: 67 cm/sec RIGHT CAROTID ARTERY: Mild calcified atheromatous plaque of the RIGHT carotid bulb extending into the proximal RIGHT internal carotid artery. RIGHT VERTEBRAL ARTERY:  Antegrade flow. LEFT CAROTID ARTERY: Mild calcified plaque of the LEFT carotid bulb. Moderate calcified plaque  of the proximal LEFT internal carotid artery. LEFT VERTEBRAL ARTERY:  Antegrade flow. IMPRESSION: 1. Mild atheromatous plaque of the proximal RIGHT internal carotid artery with velocity parameters consistent with less than 50% stenosis. 2. Mild calcified plaque of the proximal LEFT internal carotid artery with velocity parameters consistent with less than 50% stenosis. Electronically Signed   By: Aliene Lloyd M.D.   On: 12/03/2024 07:08   CARDIAC CATHETERIZATION Result Date: 12/02/2024   Prox RCA lesion is 100% stenosed.   Prox LAD to Mid LAD lesion is 30% stenosed. 1.  Significant one-vessel coronary artery disease with chronically occluded proximal codominant right coronary artery.  Patent LAD stent with mild in-stent restenosis.  Moderately calcified coronary arteries overall. 2.  Left ventricular  angiography was not performed.  I did not attempt to cross the aortic valve. 3.  Right heart catheterization showed mildly elevated right atrial pressure, normal wedge pressure, mild pulmonary hypertension and normal cardiac output. Recommendations: Recommend medical therapy for coronary artery disease. Proceed with urgent evaluation of severe to critical aortic stenosis. Avoid catheterization via the right radial artery in the future due to severe innominate artery tortuosity.  The procedure was done via the left radial artery.   US  RENAL Result Date: 11/30/2024 EXAM: RETROPERITONEAL ULTRASOUND OF THE KIDNEYS 11/30/2024 10:19:29 PM TECHNIQUE: Real-time ultrasonography of the retroperitoneum, specifically the kidneys and urinary bladder, was performed. COMPARISON: Ultrasound dated 09/09/2018. CLINICAL HISTORY: Acute kidney injury. ICD 207 [id number] Acute kidney injury. FINDINGS: RIGHT KIDNEY: Right kidney measures 9.1 cm in length. Right renal volume 150 ml. Benign cysts in the right kidney measuring up to 7.4 cm. No follow up recommended. Normal cortical echogenicity. No hydronephrosis. No calculus. No mass. LEFT KIDNEY: Left kidney measures 10.9 cm in length. Left renal volume 191 ml. 2.8 x 2.6 x 2.5 cm mildly hyperechoic mass with internal vascularity in the upper pole of the left kidney. Normal cortical echogenicity. No hydronephrosis. No calculus. IMPRESSION: 1. 2.8 mildly hyperechoic vascular mass in the upper pole of the left kidney. Recommend further characterization with renal protocol CT or MRI with and without IV contrast. Electronically signed by: Norman Gatlin MD 11/30/2024 10:31 PM EST RP Workstation: [id number]   ECHOCARDIOGRAM COMPLETE Result Date: 11/21/2024    ECHOCARDIOGRAM REPORT   Patient Name:   DAK SZUMSKI [facility] Date of Exam: 11/19/2024 Medical Rec #:  [id number]        Height:       68.0 in Accession #:    [id number]       Weight:       231.0 lb Date of Birth:  04-21-1946        BSA:           2.173 m Patient Age:    78 years         BP:           174/69 mmHg Patient Gender: M                HR:           64 bpm. Exam Location:  Outpatient Procedure: 2D Echo, Cardiac Doppler and Color Doppler (Both Spectral and Color            Flow Doppler were utilized during procedure). Indications:    Aortic Stenosis I35.0  History:        Patient has prior history of Echocardiogram examinations, most                 recent 09/22/2024. CAD,  Signs/Symptoms:Murmur; Risk                 Factors:Hypertension.  Sonographer:    Koleen Popper RDCS Referring Phys: [id number] ARUN K THUKKANI IMPRESSIONS  1. Left ventricular ejection fraction, by estimation, is 55 to 60%. The left ventricle has normal function. The left ventricle has no regional wall motion abnormalities. There is mild concentric left ventricular hypertrophy. Left ventricular diastolic parameters are consistent with Grade I diastolic dysfunction (impaired relaxation).  2. Right ventricular systolic function is normal. The right ventricular size is normal. There is normal pulmonary artery systolic pressure.  3. Left atrial size was mildly dilated.  4. The mitral valve is normal in structure. Trivial mitral valve regurgitation. No evidence of mitral stenosis.  5. The aortic valve is calcified. There is severe calcifcation of the aortic valve. There is severe thickening of the aortic valve. Aortic valve regurgitation is moderate. Severe aortic valve stenosis. Aortic valve area, by VTI measures 0.48 cm. Aortic  valve mean gradient measures 39.0 mmHg. Aortic valve Vmax measures 3.99 m/s.  6. The inferior vena cava is normal in size with greater than 50% respiratory variability, suggesting right atrial pressure of 3 mmHg. FINDINGS  Left Ventricle: Left ventricular ejection fraction, by estimation, is 55 to 60%. The left ventricle has normal function. The left ventricle has no regional wall motion abnormalities. The left ventricular internal cavity size was normal in  size. There is  mild concentric left ventricular hypertrophy. Left ventricular diastolic parameters are consistent with Grade I diastolic dysfunction (impaired relaxation). Indeterminate filling pressures. Right Ventricle: The right ventricular size is normal. No increase in right ventricular wall thickness. Right ventricular systolic function is normal. There is normal pulmonary artery systolic pressure. The tricuspid regurgitant velocity is 1.37 m/s, and  with an assumed right atrial pressure of 3 mmHg, the estimated right ventricular systolic pressure is 10.5 mmHg. Left Atrium: Left atrial size was mildly dilated. Right Atrium: Right atrial size was normal in size. Pericardium: There is no evidence of pericardial effusion. Mitral Valve: The mitral valve is normal in structure. Trivial mitral valve regurgitation. No evidence of mitral valve stenosis. Tricuspid Valve: The tricuspid valve is normal in structure. Tricuspid valve regurgitation is mild . No evidence of tricuspid stenosis. Aortic Valve: The aortic valve is calcified. There is severe calcifcation of the aortic valve. There is severe thickening of the aortic valve. Aortic valve regurgitation is moderate. Aortic regurgitation PHT measures 152 msec. Severe aortic stenosis is present. Aortic valve mean gradient measures 39.0 mmHg. Aortic valve peak gradient measures 63.7 mmHg. Aortic valve area, by VTI measures 0.48 cm. Pulmonic Valve: The pulmonic valve was normal in structure. Pulmonic valve regurgitation is mild. No evidence of pulmonic stenosis. Aorta: The aortic root and ascending aorta are structurally normal, with no evidence of dilitation. Venous: The inferior vena cava is normal in size with greater than 50% respiratory variability, suggesting right atrial pressure of 3 mmHg. IAS/Shunts: No atrial level shunt detected by color flow Doppler.  LEFT VENTRICLE PLAX 2D LVIDd:         5.90 cm      Diastology LVIDs:         4.00 cm      LV e' medial:     4.80 cm/s LV PW:         1.10 cm      LV E/e' medial:  11.5 LV IVS:        1.30 cm  LV e' lateral:   5.63 cm/s LVOT diam:     1.70 cm      LV E/e' lateral: 9.8 LV SV:         47 LV SV Index:   21 LVOT Area:     2.27 cm  LV Volumes (MOD) LV vol d, MOD A2C: 182.0 ml LV vol d, MOD A4C: 224.0 ml LV vol s, MOD A2C: 76.5 ml LV vol s, MOD A4C: 98.5 ml LV SV MOD A2C:     105.5 ml LV SV MOD A4C:     224.0 ml LV SV MOD BP:      117.4 ml RIGHT VENTRICLE             IVC RV Basal diam:  3.30 cm     IVC diam: 2.30 cm RV S prime:     12.90 cm/s TAPSE (M-mode): 2.1 cm LEFT ATRIUM             Index        RIGHT ATRIUM           Index LA diam:        5.10 cm 2.35 cm/m   RA Area:     13.90 cm LA Vol (A2C):   48.9 ml 22.51 ml/m  RA Volume:   29.50 ml  13.58 ml/m LA Vol (A4C):   70.3 ml 32.35 ml/m LA Biplane Vol: 60.1 ml 27.66 ml/m  AORTIC VALVE                     PULMONIC VALVE AV Area (Vmax):    0.44 cm      PR End Diast Vel: 2.29 msec AV Area (Vmean):   0.43 cm AV Area (VTI):     0.48 cm AV Vmax:           399.00 cm/s AV Vmean:          296.000 cm/s AV VTI:            0.978 m AV Peak Grad:      63.7 mmHg AV Mean Grad:      39.0 mmHg LVOT Vmax:         78.20 cm/s LVOT Vmean:        55.800 cm/s LVOT VTI:          0.205 m LVOT/AV VTI ratio: 0.21 AI PHT:            152 msec  AORTA Ao Root diam: 3.10 cm Ao Asc diam:  3.60 cm MITRAL VALVE               TRICUSPID VALVE MV Area (PHT): 2.00 cm    TR Peak grad:   7.5 mmHg MV Decel Time: 380 msec    TR Vmax:        137.00 cm/s MR Peak grad: 44.4 mmHg MR Vmax:      333.00 cm/s  SHUNTS MV E velocity: 55.00 cm/s  Systemic VTI:  0.20 m MV A velocity: 76.50 cm/s  Systemic Diam: 1.70 cm MV E/A ratio:  0.72 Annabella Scarce MD Electronically signed by Annabella Scarce MD Signature Date/Time: 11/21/2024/2:36:40 PM    Final     Microbiology: Results for orders placed or performed during the hospital encounter of 09/20/24  Blood Culture (routine x 2)     Status: None   Collection  Time: 09/20/24  5:29 PM   Specimen: Right Antecubital; Blood  Result Value Ref Range Status  Specimen Description RIGHT ANTECUBITAL  Final   Special Requests   Final    BOTTLES DRAWN AEROBIC AND ANAEROBIC Blood Culture adequate volume   Culture   Final    NO GROWTH 5 DAYS Performed at [facility], [address] [place], [place] [address]    Report Status 09/25/2024 FINAL  Final    Labs: CBC: Recent Labs  Lab 11/30/24 1030 11/30/24 1333 12/01/24 0406 12/02/24 0820 12/02/24 0825 12/03/24 0501  WBC 4.5 5.2 3.8*  --   --  3.7*  NEUTROABS  --  3.6  --   --   --   --   HGB 13.2 11.3* 11.0* 11.2* 11.2* 11.5*  HCT 39.7 33.2* 32.5* 33.0* 33.0* 34.5*  MCV 106* 103.4* 103.5*  --   --  105.2*  PLT 188 156 142*  --   --  139*   Basic Metabolic Panel: Recent Labs  Lab 11/30/24 1030 11/30/24 1333 12/01/24 0406 12/02/24 0820 12/02/24 0825 12/03/24 0501  NA 140 142 141 140 140 139  K 5.1 4.9 4.3 3.9 3.8 4.0  CL 105 109 108  --   --  107  CO2 21 23 21*  --   --  21*  GLUCOSE 108* 111* 93  --   --  94  BUN 25 26* 25*  --   --  20  CREATININE 1.48* 1.60* 1.40*  --   --  1.20  CALCIUM  9.2 8.8* 8.6*  --   --  8.7*  MG  --  2.0  --   --   --  1.9   Liver Function Tests: Recent Labs  Lab 11/30/24 1030  AST 20  ALT 17  ALKPHOS 76  BILITOT 0.3  PROT 6.2  ALBUMIN 4.5   CBG: Recent Labs  Lab 12/02/24 0717 12/02/24 0942 12/02/24 1637 12/02/24 2020 12/03/24 0803  GLUCAP 89 89 89 109* 96    Discharge time spent: 35 minutes.  Signed: Murvin Mana, MD [facility] 12/03/2024 "

## 2024-12-03 NOTE — Plan of Care (Signed)
   Problem: Education: Goal: Ability to describe self-care measures that may prevent or decrease complications (Diabetes Survival Skills Education) will improve Outcome: Progressing Goal: Individualized Educational Video(s) Outcome: Progressing   Problem: Coping: Goal: Ability to adjust to condition or change in health will improve Outcome: Progressing   Problem: Fluid Volume: Goal: Ability to maintain a balanced intake and output will improve Outcome: Progressing   Problem: Health Behavior/Discharge Planning: Goal: Ability to identify and utilize available resources and services will improve Outcome: Progressing Goal: Ability to manage health-related needs will improve Outcome: Progressing   Problem: Metabolic: Goal: Ability to maintain appropriate glucose levels will improve Outcome: Progressing   Problem: Nutritional: Goal: Maintenance of adequate nutrition will improve Outcome: Progressing Goal: Progress toward achieving an optimal weight will improve Outcome: Progressing   Problem: Skin Integrity: Goal: Risk for impaired skin integrity will decrease Outcome: Progressing   Problem: Tissue Perfusion: Goal: Adequacy of tissue perfusion will improve Outcome: Progressing   Problem: Education: Goal: Understanding of CV disease, CV risk reduction, and recovery process will improve Outcome: Progressing Goal: Individualized Educational Video(s) Outcome: Progressing   Problem: Activity: Goal: Ability to return to baseline activity level will improve Outcome: Progressing   Problem: Cardiovascular: Goal: Ability to achieve and maintain adequate cardiovascular perfusion will improve Outcome: Progressing Goal: Vascular access site(s) Level 0-1 will be maintained Outcome: Progressing   Problem: Health Behavior/Discharge Planning: Goal: Ability to safely manage health-related needs after discharge will improve Outcome: Progressing   Problem: Education: Goal: Knowledge  of General Education information will improve Description: Including pain rating scale, medication(s)/side effects and non-pharmacologic comfort measures Outcome: Progressing   Problem: Health Behavior/Discharge Planning: Goal: Ability to manage health-related needs will improve Outcome: Progressing   Problem: Clinical Measurements: Goal: Ability to maintain clinical measurements within normal limits will improve Outcome: Progressing Goal: Will remain free from infection Outcome: Progressing Goal: Diagnostic test results will improve Outcome: Progressing Goal: Respiratory complications will improve Outcome: Progressing Goal: Cardiovascular complication will be avoided Outcome: Progressing   Problem: Activity: Goal: Risk for activity intolerance will decrease Outcome: Progressing   Problem: Nutrition: Goal: Adequate nutrition will be maintained Outcome: Progressing   Problem: Coping: Goal: Level of anxiety will decrease Outcome: Progressing   Problem: Elimination: Goal: Will not experience complications related to bowel motility Outcome: Progressing Goal: Will not experience complications related to urinary retention Outcome: Progressing   Problem: Pain Managment: Goal: General experience of comfort will improve and/or be controlled Outcome: Progressing   Problem: Safety: Goal: Ability to remain free from injury will improve Outcome: Progressing   Problem: Skin Integrity: Goal: Risk for impaired skin integrity will decrease Outcome: Progressing

## 2024-12-03 NOTE — TOC CM/SW Note (Signed)
 Transition of Care [facility]) - Inpatient Brief Assessment   Patient Details  Name: MANUAL NAVARRA MRN: [id number] Date of Birth: [birth year]  [facility]) CM/SW Contact:    Lauraine JAYSON Carpen, LCSW Phone Number: 12/03/2024, 9:58 AM   Clinical Narrative: CSW reviewed chart. SDOH flag for social isolation. Resources added to AVS. No other TOC needs identified. CSW signing off.  Transition of Care Asessment: Insurance and Status: Insurance coverage has been reviewed Patient has primary care physician: Yes Home environment has been reviewed: Single family home Prior level of function:: Independent Prior/Current Home Services: No current home services Social Drivers of Health Review: SDOH reviewed interventions complete Readmission risk has been reviewed: Yes Transition of care needs: no transition of care needs at this time

## 2024-12-03 NOTE — Care Management Important Message (Signed)
 Important Message  Patient Details  Name: SHADMAN TOZZI MRN: [id number] Date of Birth: 1946/01/18   Important Message Given:  Yes - Medicare IM     Zanai Mallari 12/03/2024, 12:50 PM

## 2024-12-06 ENCOUNTER — Other Ambulatory Visit (HOSPITAL_COMMUNITY)

## 2024-12-10 ENCOUNTER — Encounter: Admitting: Thoracic Surgery (Cardiothoracic Vascular Surgery)

## 2024-12-10 ENCOUNTER — Other Ambulatory Visit (HOSPITAL_COMMUNITY)

## 2024-12-13 ENCOUNTER — Encounter: Admitting: Dietician

## 2024-12-31 ENCOUNTER — Ambulatory Visit: Admitting: Urology

## 2025-01-19 ENCOUNTER — Ambulatory Visit: Admitting: Internal Medicine
